# Patient Record
Sex: Female | Born: 1975 | Race: Black or African American | Hispanic: No | Marital: Married | Smoking: Never smoker
Health system: Southern US, Community
[De-identification: ages and names within clinical notes are randomized; demographics above are authoritative.]

## PROBLEM LIST (undated history)

## (undated) DIAGNOSIS — R55 Syncope and collapse: Secondary | ICD-10-CM

## (undated) DIAGNOSIS — I10 Essential (primary) hypertension: Secondary | ICD-10-CM

## (undated) DIAGNOSIS — R569 Unspecified convulsions: Secondary | ICD-10-CM

## (undated) DIAGNOSIS — R5600 Simple febrile convulsions: Secondary | ICD-10-CM

## (undated) DIAGNOSIS — E785 Hyperlipidemia, unspecified: Secondary | ICD-10-CM

## (undated) DIAGNOSIS — N200 Calculus of kidney: Secondary | ICD-10-CM

## (undated) DIAGNOSIS — E119 Type 2 diabetes mellitus without complications: Secondary | ICD-10-CM

## (undated) DIAGNOSIS — N6452 Nipple discharge: Secondary | ICD-10-CM

## (undated) DIAGNOSIS — G40909 Epilepsy, unspecified, not intractable, without status epilepticus: Secondary | ICD-10-CM

## (undated) DIAGNOSIS — G4730 Sleep apnea, unspecified: Secondary | ICD-10-CM

## (undated) DIAGNOSIS — J309 Allergic rhinitis, unspecified: Secondary | ICD-10-CM

## (undated) DIAGNOSIS — N643 Galactorrhea not associated with childbirth: Secondary | ICD-10-CM

## (undated) DIAGNOSIS — R000 Tachycardia, unspecified: Secondary | ICD-10-CM

## (undated) DIAGNOSIS — N63 Unspecified lump in unspecified breast: Secondary | ICD-10-CM

## (undated) DIAGNOSIS — Z8632 Personal history of gestational diabetes: Secondary | ICD-10-CM

## (undated) DIAGNOSIS — E669 Obesity, unspecified: Secondary | ICD-10-CM

## (undated) HISTORY — DX: Galactorrhea not associated with childbirth: N64.3

## (undated) HISTORY — PX: TUBAL LIGATION: SHX77

## (undated) HISTORY — DX: Obesity, unspecified: E66.9

## (undated) HISTORY — DX: Type 2 diabetes mellitus without complications: E11.9

## (undated) HISTORY — DX: Hyperlipidemia, unspecified: E78.5

## (undated) HISTORY — DX: Allergic rhinitis, unspecified: J30.9

## (undated) HISTORY — DX: Syncope and collapse: R55

## (undated) HISTORY — DX: Morbid (severe) obesity due to excess calories: E66.01

## (undated) HISTORY — DX: Calculus of kidney: N20.0

## (undated) HISTORY — DX: Personal history of gestational diabetes: Z86.32

## (undated) HISTORY — DX: Tachycardia, unspecified: R00.0

## (undated) HISTORY — DX: Sleep apnea, unspecified: G47.30

---

## 1999-11-06 HISTORY — PX: RIGHT OOPHORECTOMY: SHX2359

## 2005-11-05 HISTORY — PX: TUBAL LIGATION: SHX77

## 2005-12-18 ENCOUNTER — Ambulatory Visit: Payer: Self-pay | Admitting: Family Medicine

## 2006-01-28 ENCOUNTER — Ambulatory Visit (HOSPITAL_COMMUNITY): Admission: RE | Admit: 2006-01-28 | Discharge: 2006-01-28 | Payer: Self-pay | Admitting: Obstetrics & Gynecology

## 2006-04-08 ENCOUNTER — Ambulatory Visit (HOSPITAL_COMMUNITY): Admission: RE | Admit: 2006-04-08 | Discharge: 2006-04-08 | Payer: Self-pay | Admitting: Obstetrics & Gynecology

## 2006-06-29 ENCOUNTER — Inpatient Hospital Stay (HOSPITAL_COMMUNITY): Admission: AD | Admit: 2006-06-29 | Discharge: 2006-06-29 | Payer: Self-pay | Admitting: Obstetrics and Gynecology

## 2006-08-02 ENCOUNTER — Inpatient Hospital Stay (HOSPITAL_COMMUNITY): Admission: AD | Admit: 2006-08-02 | Discharge: 2006-08-02 | Payer: Self-pay | Admitting: Obstetrics and Gynecology

## 2006-08-03 ENCOUNTER — Inpatient Hospital Stay (HOSPITAL_COMMUNITY): Admission: AD | Admit: 2006-08-03 | Discharge: 2006-08-03 | Payer: Self-pay | Admitting: Obstetrics and Gynecology

## 2006-08-05 ENCOUNTER — Inpatient Hospital Stay (HOSPITAL_COMMUNITY): Admission: AD | Admit: 2006-08-05 | Discharge: 2006-08-05 | Payer: Self-pay | Admitting: Obstetrics and Gynecology

## 2006-08-16 ENCOUNTER — Encounter (INDEPENDENT_AMBULATORY_CARE_PROVIDER_SITE_OTHER): Payer: Self-pay | Admitting: Specialist

## 2006-08-16 ENCOUNTER — Inpatient Hospital Stay (HOSPITAL_COMMUNITY): Admission: RE | Admit: 2006-08-16 | Discharge: 2006-08-19 | Payer: Self-pay | Admitting: Obstetrics and Gynecology

## 2007-01-15 ENCOUNTER — Emergency Department (HOSPITAL_COMMUNITY): Admission: EM | Admit: 2007-01-15 | Discharge: 2007-01-15 | Payer: Self-pay | Admitting: Emergency Medicine

## 2007-07-29 DIAGNOSIS — D72829 Elevated white blood cell count, unspecified: Secondary | ICD-10-CM

## 2007-09-01 ENCOUNTER — Ambulatory Visit: Payer: Self-pay | Admitting: Gastroenterology

## 2008-07-02 DIAGNOSIS — E785 Hyperlipidemia, unspecified: Secondary | ICD-10-CM | POA: Insufficient documentation

## 2008-07-16 ENCOUNTER — Ambulatory Visit: Payer: Self-pay | Admitting: Family Medicine

## 2008-11-26 DIAGNOSIS — G4731 Primary central sleep apnea: Secondary | ICD-10-CM | POA: Insufficient documentation

## 2008-12-15 ENCOUNTER — Emergency Department (HOSPITAL_COMMUNITY): Admission: EM | Admit: 2008-12-15 | Discharge: 2008-12-15 | Payer: Self-pay | Admitting: Emergency Medicine

## 2008-12-28 ENCOUNTER — Ambulatory Visit: Payer: Self-pay | Admitting: Family Medicine

## 2011-02-20 LAB — DIFFERENTIAL
Basophils Relative: 0 % (ref 0–1)
Eosinophils Relative: 0 % (ref 0–5)
Lymphocytes Relative: 7 % — ABNORMAL LOW (ref 12–46)
Lymphs Abs: 1.2 10*3/uL (ref 0.7–4.0)
Monocytes Relative: 8 % (ref 3–12)
Neutro Abs: 14.3 10*3/uL — ABNORMAL HIGH (ref 1.7–7.7)
Neutrophils Relative %: 85 % — ABNORMAL HIGH (ref 43–77)

## 2011-02-20 LAB — CBC
HCT: 38.2 % (ref 36.0–46.0)
Hemoglobin: 12.4 g/dL (ref 12.0–15.0)
MCHC: 32.4 g/dL (ref 30.0–36.0)
MCV: 77.9 fL — ABNORMAL LOW (ref 78.0–100.0)
Platelets: 275 10*3/uL (ref 150–400)
RBC: 4.9 MIL/uL (ref 3.87–5.11)

## 2011-02-20 LAB — POCT I-STAT, CHEM 8
BUN: 4 mg/dL — ABNORMAL LOW (ref 6–23)
Chloride: 97 mEq/L (ref 96–112)
Hemoglobin: 13.6 g/dL (ref 12.0–15.0)

## 2011-03-23 NOTE — Discharge Summary (Signed)
Erin Good, Erin Good         ACCOUNT NO.:  [id number]   MEDICAL RECORD NO.:  [id number]          PATIENT TYPE:  INP   LOCATION:  9125                          FACILITY:  [facility]   PHYSICIAN:  Freda Munro, M.D.    DATE OF BIRTH:  1976/09/21   DATE OF ADMISSION:  08/16/2006  DATE OF DISCHARGE:  08/19/2006                                 DISCHARGE SUMMARY   FINAL DIAGNOSES:  1. Intrauterine pregnancy at term,  2. History of prior cesarean sections.  The patient desires repeat      cesarean section.  3. Multiparous and desires permanent sterilization.  4. Insulin-requiring diabetic.  5. Epilepsy.  6. History of macrosomia.   PROCEDURE:  Repeat low transverse cesarean section with bilateral tubal  ligation.   SURGEON:  Dr. Bobbye Charleston.   ASSISTANT:  Dr. Vanessa Kick.   COMPLICATIONS:  None.   This 35 year old, G2 P1-0-0-1, presents at term for a repeat cesarean  section.  The patient had a prior cesarean section secondary to macrosomia  and desires repeat with this pregnancy and also desires permanent  sterilization.  The patient's antepartum course has been complicated by her  diabetes.  She has been on insulin throughout the pregnancy and has been in  good control.  The patient also has a history of epilepsy, has been on  Lamictal throughout her pregnancy.  Her levels were checked monthly and was  also seeing a neurologist as well.  The patient was seen for antepartum  testing as needed with BPP and NSTs which were reactive and within normal  limits.  She is admitted at this time.  She is taken to the operating room  on August 16, 2006, where a repeat low transverse cesarean section was  performed with the delivery of a 9 pounds 0 ounces female infant with  Apgar's of 7 and 8, delivery went without complications.  The patient still  expressed her desires for permanent sterilization which was performed.  Her postoperative course was benign without any significant  fevers.  The  baby was in the NICU.  I am not sure of the reasoning but the baby did get  transferred to the NICU and was being monitored.  Her blood sugars were  monitored.   She was sent home on metformin to take her 500 mg in the morning, was to  take Percocet one to two every 4 hours as needed for pain, was to follow up  on Thursday in the office for an incision check, to be checking her blood  sugars fasting and 2-hour postprandial, to call with any change in her blood  sugars any increased bleeding, pain, fever or problems.   DISCHARGE LABORATORY:  The patient had a hemoglobin of 9.7, white blood cell  count of 10.2, platelets of 206,000.      Jeannette How, P.A.-C.    ______________________________  Freda Munro, M.D.    MB/MEDQ  D:  09/06/2006  T:  09/06/2006  Job:  [id number]

## 2011-03-23 NOTE — Op Note (Signed)
Erin Good, Erin Good         ACCOUNT NO.:  [id number]   MEDICAL RECORD NO.:  [id number]          PATIENT TYPE:  INP   LOCATION:  9125                          FACILITY:  [facility]   PHYSICIAN:  Bobbye Charleston, M.D. DATE OF BIRTH:  1975/11/15   DATE OF PROCEDURE:  08/16/2006  DATE OF DISCHARGE:                                 OPERATIVE REPORT   PREOPERATIVE DIAGNOSES:  1. Repeat cesarean section at term.  2. Multiparous, desires permanent sterility.   POSTOPERATIVE DIAGNOSES:  1. Repeat cesarean section at term.  2. Multiparous, desires permanent sterility.   PROCEDURE:  Low transverse cesarean section with bilateral tubal ligation  incision.   SURGEON:  Bobbye Charleston, M.D.   ASSISTANT:  Farrel Gobble. Harrington Challenger, M.D.   ANESTHESIA:  Spinal.   SPECIMENS:  Right and left tubal segments and placenta, both the pathology.   ESTIMATED BLOOD LOSS:  750 mL.   INTRAVENOUS FLUIDS:  2000 mL.   URINE OUTPUT:  150 mL.   COMPLICATIONS:  None.   FINDINGS:  A female infant in vertex presentation, Apgar scores 7 and 8,  weight 9 pounds even.  There was a normal right tube and ovary and uterus  seen.  There was no left tube and ovary seen, as this had been removed at  prior surgery.   COUNTS:  Correct x3.   TECHNIQUE:  After adequate spinal epidural anesthesia was achieved, the  patient was placed in the dorsal supine position with a leftward tilt.  A  skin incision was made subumbilical, vertically, and curving around the  lower part of the umbilicus with the scalpel.  The incision was then carried  down to the fascia with the Bovie cautery.  The fascia was incised with the  scalpel in a superior and inferior manner.  Mayo scissors were then used to  incise the fascia in a superior and inferior manner with good visualization  of the bladder.  The peritoneum was entered into sharply and only omentum  was underneath the incision.  The peritoneum was then incised in a superior  and  inferior manner with good visualization of the bowel and the bladder.   The omentum was stuck to both sides of the peritoneum on either side of the  incision and this was carefully taken down with the Bovie cautery and there  was no the bowel anywhere in the field of dissection.  The omentum was freed  and then released back up into the abdomen and a bladder blade was placed.  The vesicouterine fascia was tented up and incised in a transverse  curvilinear manner.  The bladder flap was created with blunt and sharp  dissection.  A 2-cm incision was made in the upper portion of the lower  uterine segment with the scalpel until clear fluid was noted on entry into  the amnion.  The bandage scissors were used to extend the incision in a  transverse curvilinear manner.  The baby was identified in the vertex  presentation and brought through the incision without complication.  The  baby was bulb-suctioned and the cord was clamped and cut.  The baby was  handed to awaiting Pediatrics.   After cord bloods were obtained, the placenta was delivered manually and the  uterus exteriorized, wrapped in wet lap, and cleared of all debris.  The  uterine incision was closed with a running-lock stitch of 0 Monocryl.  An  imbricating layer was then performed of 0 Monocryl.  Three additional  stitches of 9 Monocryl were then used to ensure hemostasis.  Attention was  then turned to the tube and ovary.  The left side was inspected and found to  have only a round ligament on the left-hand side, no ovary and no tube.  The  right tube was then tented up with the Babcock and the mesosalpinx entered  into with the Bovie cautery.  Two plain gut ties were placed on each side  and tied down to create an intervening segment of approximately 2 cm.  The  intervening segment was then excised with a Metzenbaum and sent to  Pathology.   Hemostasis was achieved and the uterine incision was reinspected and found  to be  hemostatic.  The uterus was replaced in the abdomen and the abdomen  cleared of debris with irrigation.  The uterine incision was reinspected and  found to be hemostatic inside the abdomen.  All instruments were then  withdrawn from the abdomen.  The peritoneum was closed with a running stitch  of 2-0 Vicryl; this incorporated 1 stitch of the rectus muscles.  The 0 PDS  was then used to close the fascia and the rectus muscles together in the  midline.  The subcutaneous tissue was then rendered hemostatic with Bovie  cautery and irrigation.  This layer was then closed with interrupted  stitches of 2-0 plain gut.  It was decided that since there was not a lot of  fluid in this area that we would not to put a drain in.  The subcuticular  tissue was then closed with interrupted stitches of 2-0 plain gut to  reapproximate the skin edges better.  The skin was closed with staples.  The  patient tolerated the procedure well and was returned to the recovery room  in stable condition.      Bobbye Charleston, M.D.  Electronically Signed     MH/MEDQ  D:  08/16/2006  T:  08/19/2006  Job:  [id number]

## 2011-05-11 ENCOUNTER — Other Ambulatory Visit: Payer: Self-pay | Admitting: Obstetrics and Gynecology

## 2012-03-04 ENCOUNTER — Encounter (HOSPITAL_COMMUNITY): Payer: Self-pay | Admitting: *Deleted

## 2012-03-04 ENCOUNTER — Emergency Department (HOSPITAL_COMMUNITY): Payer: Managed Care, Other (non HMO)

## 2012-03-04 ENCOUNTER — Emergency Department (HOSPITAL_COMMUNITY)
Admission: EM | Admit: 2012-03-04 | Discharge: 2012-03-04 | Disposition: A | Discharge status: Home / Self Care | Payer: Managed Care, Other (non HMO) | Attending: Emergency Medicine | Admitting: Emergency Medicine

## 2012-03-04 DIAGNOSIS — R109 Unspecified abdominal pain: Secondary | ICD-10-CM | POA: Insufficient documentation

## 2012-03-04 DIAGNOSIS — R0602 Shortness of breath: Secondary | ICD-10-CM | POA: Insufficient documentation

## 2012-03-04 DIAGNOSIS — R10819 Abdominal tenderness, unspecified site: Secondary | ICD-10-CM | POA: Insufficient documentation

## 2012-03-04 DIAGNOSIS — E669 Obesity, unspecified: Secondary | ICD-10-CM | POA: Insufficient documentation

## 2012-03-04 DIAGNOSIS — G40909 Epilepsy, unspecified, not intractable, without status epilepticus: Secondary | ICD-10-CM | POA: Insufficient documentation

## 2012-03-04 HISTORY — DX: Unspecified convulsions: R56.9

## 2012-03-04 HISTORY — DX: Epilepsy, unspecified, not intractable, without status epilepticus: G40.909

## 2012-03-04 LAB — URINALYSIS, ROUTINE W REFLEX MICROSCOPIC
Bilirubin Urine: NEGATIVE
Glucose, UA: NEGATIVE mg/dL
Hgb urine dipstick: NEGATIVE
Ketones, ur: NEGATIVE mg/dL
Leukocytes, UA: NEGATIVE
Nitrite: NEGATIVE
Protein, ur: NEGATIVE mg/dL
Specific Gravity, Urine: 1.023 (ref 1.005–1.030)
Urobilinogen, UA: 0.2 mg/dL (ref 0.0–1.0)
pH: 6.5 (ref 5.0–8.0)

## 2012-03-04 LAB — PREGNANCY, URINE: Preg Test, Ur: NEGATIVE

## 2012-03-04 LAB — WET PREP, GENITAL
Clue Cells Wet Prep HPF POC: NONE SEEN
Trich, Wet Prep: NONE SEEN
Yeast Wet Prep HPF POC: NONE SEEN

## 2012-03-04 MED ORDER — HYDROCODONE-ACETAMINOPHEN 10-325 MG PO TABS
1.0000 | ORAL_TABLET | Freq: Once | ORAL | Status: AC
  Administered 2012-03-04: 1 via ORAL
  Filled 2012-03-04: qty 1

## 2012-03-04 MED ORDER — HYDROCODONE-ACETAMINOPHEN 5-325 MG PO TABS: 1.0000 | ORAL_TABLET | ORAL | Status: AC | PRN

## 2012-03-04 NOTE — ED Provider Notes (Signed)
History     CSN: [id number]  Arrival date & time 03/04/12  7425   First MD Initiated Contact with Patient 03/04/12 0848      Chief Complaint  Patient presents with  . Abdominal Pain  . Shortness of Breath    (Consider location/radiation/quality/duration/timing/severity/associated sxs/prior treatment) HPI Comments: Patient presents complaining of suprapubic pain that began this morning at approximately 7:30.  She describes it as crampy and feeling like when she had a miscarriage.  She does note that she's had a tubal ligation so she does not believe she is pregnant.  She denies any current vaginal bleeding or vaginal discharge.  Her last menstrual period was 2 weeks ago and normal for her.  She denies any dysuria or hematuria or history of kidney stones.  Denies any nausea, vomiting or changes in bowel habits.  If she bears down the pain does get worse.  She notes when the pain first came on and was very sharp and stabbing that she felt a little short of breath but as she was able to drink some water and calm herself down her breathing is back to normal now.  She also notes that her abdominal pain is slowly improved without further intervention.  She did not take any pain medications at home.  She describes the pain as persistent which is why she comes in for evaluation.  Patient is a 36 y.o. female presenting with abdominal pain and shortness of breath. The history is provided by the patient. No language interpreter was used.  Abdominal Pain The primary symptoms of the illness include abdominal pain and shortness of breath. The primary symptoms of the illness do not include fever, fatigue, nausea, vomiting, diarrhea, hematemesis, hematochezia, dysuria, vaginal discharge or vaginal bleeding. The current episode started 1 to 2 hours ago. The onset of the illness was gradual. The problem has been gradually improving.  The patient states that she believes she is currently not pregnant. The patient  has not had a change in bowel habit. Symptoms associated with the illness do not include chills, anorexia, diaphoresis, heartburn, constipation, urgency, hematuria, frequency or back pain.  Shortness of Breath  Associated symptoms include shortness of breath. Pertinent negatives include no chest pain, no fever and no cough.    Past Medical History  Diagnosis Date  . Epilepsy   . Seizures     Past Surgical History  Procedure Date  . Cesarean section   . Tubal ligation   . Right oophorectomy     No family history on file.  History  Substance Use Topics  . Smoking status: Never Smoker   . Smokeless tobacco: Not on file  . Alcohol Use: No    OB History    Grav Para Term Preterm Abortions TAB SAB Ect Mult Living                  Review of Systems  Constitutional: Negative.  Negative for fever, chills, diaphoresis and fatigue.  HENT: Negative.   Eyes: Negative.  Negative for discharge and redness.  Respiratory: Positive for shortness of breath. Negative for cough.   Cardiovascular: Negative.  Negative for chest pain.  Gastrointestinal: Positive for abdominal pain. Negative for heartburn, nausea, vomiting, diarrhea, constipation, hematochezia, anorexia and hematemesis.  Genitourinary: Negative.  Negative for dysuria, urgency, frequency, hematuria, vaginal bleeding and vaginal discharge.  Musculoskeletal: Negative.  Negative for back pain.  Skin: Negative.  Negative for color change and rash.  Neurological: Negative.  Negative for syncope and  headaches.  Hematological: Negative.  Negative for adenopathy.  Psychiatric/Behavioral: Negative.  Negative for confusion.  All other systems reviewed and are negative.    Allergies  Review of patient's allergies indicates no known allergies.  Home Medications  No current outpatient prescriptions on file.  BP 127/72  Temp(Src) 98.1 F (36.7 C) (Oral)  Resp 14  SpO2 100%  LMP 02/19/2012  Physical Exam  Nursing note and  vitals reviewed. Constitutional: She is oriented to person, place, and time. She appears well-developed and well-nourished.  Non-toxic appearance. She does not have a sickly appearance.       Obese female lying on the bed  HENT:  Head: Normocephalic and atraumatic.  Eyes: Conjunctivae, EOM and lids are normal. Pupils are equal, round, and reactive to light. No scleral icterus.  Neck: Trachea normal and normal range of motion. Neck supple.  Cardiovascular: Normal rate, regular rhythm and normal heart sounds.  Exam reveals no gallop and no friction rub.   No murmur heard. Pulmonary/Chest: Effort normal and breath sounds normal. No respiratory distress. She has no wheezes. She has no rales.  Abdominal: Soft. Normal appearance. There is tenderness. There is no rebound, no guarding and no CVA tenderness.       Mild suprapubic tenderness on examination  Genitourinary:       Normal external genitalia.  No rashes/lesions.  Closed cervical os.  No bleeding or discharge.  Mild CMT on exam.  No cervical inflammation.  Unable to palpate ovaries due to body habitus.  [place] present during exam.    Musculoskeletal: Normal range of motion. She exhibits no edema.  Neurological: She is alert and oriented to person, place, and time. She has normal strength.  Skin: Skin is warm, dry and intact. No rash noted.  Psychiatric: She has a normal mood and affect. Her behavior is normal. Judgment and thought content normal.    ED Course  Procedures (including critical care time)  Results for orders placed during the hospital encounter of 03/04/12  URINALYSIS, ROUTINE W REFLEX MICROSCOPIC      Component Value Range   Color, Urine YELLOW  YELLOW    APPearance CLEAR  CLEAR    Specific Gravity, Urine 1.023  1.005 - 1.030    pH 6.5  5.0 - 8.0    Glucose, UA NEGATIVE  NEGATIVE (mg/dL)   Hgb urine dipstick NEGATIVE  NEGATIVE    Bilirubin Urine NEGATIVE  NEGATIVE    Ketones, ur NEGATIVE  NEGATIVE (mg/dL)    Protein, ur NEGATIVE  NEGATIVE (mg/dL)   Urobilinogen, UA 0.2  0.0 - 1.0 (mg/dL)   Nitrite NEGATIVE  NEGATIVE    Leukocytes, UA NEGATIVE  NEGATIVE   PREGNANCY, URINE      Component Value Range   Preg Test, Ur NEGATIVE  NEGATIVE   WET PREP, GENITAL      Component Value Range   Yeast Wet Prep HPF POC NONE SEEN  NONE SEEN    Trich, Wet Prep NONE SEEN  NONE SEEN    Clue Cells Wet Prep HPF POC NONE SEEN  NONE SEEN    WBC, Wet Prep HPF POC FEW (*) NONE SEEN    US Transvaginal Non-ob  03/04/2012  *RADIOLOGY REPORT*  Clinical Data:  Pain  TRANSABDOMINAL AND TRANSVAGINAL ULTRASOUND OF PELVIS DOPPLER ULTRASOUND OF OVARIES  Technique:  Both transabdominal and transvaginal ultrasound examinations of the pelvis were performed. Transabdominal technique was performed for global imaging of the pelvis including uterus, ovaries, adnexal regions, and pelvic cul-de-sac.  It was necessary to proceed with endovaginal exam following the transabdominal exam to visualize the endometrium and adnexa.  Color and duplex Doppler ultrasound was utilized to evaluate blood flow to the ovaries.  Comparison:  None.  Findings: The uterus is normal in size and echotexture, measuring 10.3 x 4.9 x 5.7 cm.  There is a solitary 1.2 cm posterior myometrial fibroid at the mid segment.  Endometrial stripe is thin and homogeneous, measuring 6 mm in width.  The left ovary is surgically absent.  The right ovary has a normal size and appearance, measuring 4.1 x 3.3 x 2.4 cm.  There is a 1.5 cm simple follicle on the right ovary.  A small amount of free pelvic fluid is present.  Pulsed Doppler evaluation demonstrates normal low-resistance arterial and venous waveforms inthe right ovary.  IMPRESSION: Normal exam.  No evidence of pelvic mass or other significant abnormality.  No sonographic evidence for ovarian torsion.  Original Report Authenticated By: Duayne Cal, M.D.   US Pelvis Complete  03/04/2012  *RADIOLOGY REPORT*  Clinical Data:  Pain   TRANSABDOMINAL AND TRANSVAGINAL ULTRASOUND OF PELVIS DOPPLER ULTRASOUND OF OVARIES  Technique:  Both transabdominal and transvaginal ultrasound examinations of the pelvis were performed. Transabdominal technique was performed for global imaging of the pelvis including uterus, ovaries, adnexal regions, and pelvic cul-de-sac.  It was necessary to proceed with endovaginal exam following the transabdominal exam to visualize the endometrium and adnexa.  Color and duplex Doppler ultrasound was utilized to evaluate blood flow to the ovaries.  Comparison:  None.  Findings: The uterus is normal in size and echotexture, measuring 10.3 x 4.9 x 5.7 cm.  There is a solitary 1.2 cm posterior myometrial fibroid at the mid segment.  Endometrial stripe is thin and homogeneous, measuring 6 mm in width.  The left ovary is surgically absent.  The right ovary has a normal size and appearance, measuring 4.1 x 3.3 x 2.4 cm.  There is a 1.5 cm simple follicle on the right ovary.  A small amount of free pelvic fluid is present.  Pulsed Doppler evaluation demonstrates normal low-resistance arterial and venous waveforms inthe right ovary.  IMPRESSION: Normal exam.  No evidence of pelvic mass or other significant abnormality.  No sonographic evidence for ovarian torsion.  Original Report Authenticated By: Duayne Cal, M.D.   [place] Art/ven Flow Abd Pelv Doppler  03/04/2012  *RADIOLOGY REPORT*  Clinical Data:  Pain  TRANSABDOMINAL AND TRANSVAGINAL ULTRASOUND OF PELVIS DOPPLER ULTRASOUND OF OVARIES  Technique:  Both transabdominal and transvaginal ultrasound examinations of the pelvis were performed. Transabdominal technique was performed for global imaging of the pelvis including uterus, ovaries, adnexal regions, and pelvic cul-de-sac.  It was necessary to proceed with endovaginal exam following the transabdominal exam to visualize the endometrium and adnexa.  Color and duplex Doppler ultrasound was utilized to evaluate blood flow to the  ovaries.  Comparison:  None.  Findings: The uterus is normal in size and echotexture, measuring 10.3 x 4.9 x 5.7 cm.  There is a solitary 1.2 cm posterior myometrial fibroid at the mid segment.  Endometrial stripe is thin and homogeneous, measuring 6 mm in width.  The left ovary is surgically absent.  The right ovary has a normal size and appearance, measuring 4.1 x 3.3 x 2.4 cm.  There is a 1.5 cm simple follicle on the right ovary.  A small amount of free pelvic fluid is present.  Pulsed Doppler evaluation demonstrates normal low-resistance arterial and venous waveforms  inthe right ovary.  IMPRESSION: Normal exam.  No evidence of pelvic mass or other significant abnormality.  No sonographic evidence for ovarian torsion.  Original Report Authenticated By: Duayne Cal, M.D.      MDM  Patient with no signs of urinary tract infection or hematuria to suggest kidney stone.  Patient's pregnancy test is negative.  Patient had a pelvic exam that did not suggest sexually transmitted infection or other abnormalities.  Her wet prep correlates with this at this time.  I did obtain an ultrasound to look for signs of ovarian torsion or cyst.  Patient had originally thought that she had a left ovary but that was mistaken.  She has no signs of torsion or cysts or fibroids at this time.  Patient's pain is improved here after the Norco that was administered in the emergency department.  At this point in time I feel the patient is safe for discharge home as this may be some mild constipation or GI cramping but she shows no signs of acute infection to indicate diverticulitis to necessitate further laboratory or imaging workup at this time.  I have advised the patient that she should return for worsening abdominal pain, fevers or other symptoms and she understands this at time of discharge.        Lezlie Octave, MD 03/04/12 1140

## 2012-03-04 NOTE — Discharge Instructions (Signed)

## 2012-03-04 NOTE — ED Notes (Signed)
Hosmer, EDP at bedside.

## 2012-03-04 NOTE — ED Notes (Signed)
Pt reports sob and lower abd pain since 0730 today. "feels like a miscarriage." Describes pain as cramping, has had tubes tied, LMP 2 weeks ago. Denies vaginal bleeding/discharge, urinary symptoms. C/o pain when straining to have BM. Associated sob at rest, 99% RA.

## 2012-03-05 LAB — GC/CHLAMYDIA PROBE AMP, GENITAL
Chlamydia, DNA Probe: NEGATIVE
GC Probe Amp, Genital: NEGATIVE

## 2012-03-06 ENCOUNTER — Ambulatory Visit: Payer: Self-pay | Admitting: Family Medicine

## 2012-03-07 ENCOUNTER — Ambulatory Visit: Payer: Self-pay | Admitting: Family Medicine

## 2012-03-13 ENCOUNTER — Ambulatory Visit: Payer: Self-pay | Admitting: Urology

## 2012-03-29 ENCOUNTER — Emergency Department (HOSPITAL_COMMUNITY): Payer: Managed Care, Other (non HMO)

## 2012-03-29 ENCOUNTER — Encounter (HOSPITAL_COMMUNITY): Payer: Self-pay | Admitting: Emergency Medicine

## 2012-03-29 ENCOUNTER — Emergency Department (HOSPITAL_COMMUNITY)
Admission: EM | Admit: 2012-03-29 | Discharge: 2012-03-29 | Disposition: A | Discharge status: Home / Self Care | Payer: Managed Care, Other (non HMO) | Attending: Emergency Medicine | Admitting: Emergency Medicine

## 2012-03-29 DIAGNOSIS — N83209 Unspecified ovarian cyst, unspecified side: Secondary | ICD-10-CM | POA: Insufficient documentation

## 2012-03-29 DIAGNOSIS — Z79899 Other long term (current) drug therapy: Secondary | ICD-10-CM | POA: Insufficient documentation

## 2012-03-29 DIAGNOSIS — R109 Unspecified abdominal pain: Secondary | ICD-10-CM | POA: Insufficient documentation

## 2012-03-29 DIAGNOSIS — R000 Tachycardia, unspecified: Secondary | ICD-10-CM | POA: Insufficient documentation

## 2012-03-29 DIAGNOSIS — G40909 Epilepsy, unspecified, not intractable, without status epilepticus: Secondary | ICD-10-CM | POA: Insufficient documentation

## 2012-03-29 DIAGNOSIS — B9789 Other viral agents as the cause of diseases classified elsewhere: Secondary | ICD-10-CM | POA: Insufficient documentation

## 2012-03-29 DIAGNOSIS — B349 Viral infection, unspecified: Secondary | ICD-10-CM

## 2012-03-29 DIAGNOSIS — R509 Fever, unspecified: Secondary | ICD-10-CM | POA: Insufficient documentation

## 2012-03-29 LAB — CBC
HCT: 35.9 % — ABNORMAL LOW (ref 36.0–46.0)
Hemoglobin: 11.5 g/dL — ABNORMAL LOW (ref 12.0–15.0)
MCH: 25.1 pg — ABNORMAL LOW (ref 26.0–34.0)
MCV: 78.4 fL (ref 78.0–100.0)
RBC: 4.58 MIL/uL (ref 3.87–5.11)

## 2012-03-29 LAB — DIFFERENTIAL
Eosinophils Absolute: 0 10*3/uL (ref 0.0–0.7)
Eosinophils Relative: 0 % (ref 0–5)
Lymphs Abs: 0.9 10*3/uL (ref 0.7–4.0)
Monocytes Absolute: 0.1 10*3/uL (ref 0.1–1.0)
Monocytes Relative: 1 % — ABNORMAL LOW (ref 3–12)

## 2012-03-29 LAB — BASIC METABOLIC PANEL
BUN: 7 mg/dL (ref 6–23)
Calcium: 9.5 mg/dL (ref 8.4–10.5)
Creatinine, Ser: 0.69 mg/dL (ref 0.50–1.10)
GFR calc non Af Amer: >90 mL/min (ref >90)
Glucose, Bld: 173 mg/dL — ABNORMAL HIGH (ref 70–99)
Potassium: 3.5 mEq/L (ref 3.5–5.1)

## 2012-03-29 LAB — URINALYSIS, ROUTINE W REFLEX MICROSCOPIC
Specific Gravity, Urine: 1.016 (ref 1.005–1.030)
Urobilinogen, UA: 0.2 mg/dL (ref 0.0–1.0)

## 2012-03-29 MED ORDER — SODIUM CHLORIDE 0.9 % IV BOLUS (SEPSIS)
1000.0000 mL | Freq: Once | INTRAVENOUS | Status: AC
  Administered 2012-03-29: 1000 mL via INTRAVENOUS

## 2012-03-29 MED ORDER — ACETAMINOPHEN 325 MG PO TABS
650.0000 mg | ORAL_TABLET | Freq: Once | ORAL | Status: AC
  Administered 2012-03-29: 650 mg via ORAL
  Filled 2012-03-29: qty 2

## 2012-03-29 MED ORDER — MORPHINE SULFATE 4 MG/ML IJ SOLN
4.0000 mg | Freq: Once | INTRAMUSCULAR | Status: AC
  Administered 2012-03-29: 4 mg via INTRAVENOUS
  Filled 2012-03-29: qty 1

## 2012-03-29 NOTE — ED Notes (Signed)
Pt presented to the ER with c/o left and right flank radiated to back and lower abd, pt states that she has Hx of kidney stones, pt also reports chills and generalized aching. Pain level rated 8/10 at this time.

## 2012-03-29 NOTE — ED Provider Notes (Signed)
Medical screening examination/treatment/procedure(s) were performed by non-physician practitioner and as supervising physician I was immediately available for consultation/collaboration.  Nat Christen, MD 03/29/12 1112

## 2012-03-29 NOTE — ED Notes (Signed)
Pt states she was prescribed oxycodone for pain, but it made her feel bad (anxiety, and heart fluttering) and she stopped taking it.

## 2012-03-29 NOTE — ED Notes (Signed)
Pt denies n/v at this time.

## 2012-03-29 NOTE — ED Provider Notes (Signed)
Medical screening examination/treatment/procedure(s) were performed by non-physician practitioner and as supervising physician I was immediately available for consultation/collaboration.   Wynetta Fines, MD 03/29/12 [phone]

## 2012-03-29 NOTE — Discharge Instructions (Signed)
You were seen and evaluated for your complaints of chills and lower back pains. At this time your providers feel your symptoms may be caused from a viral infection. Continue to take Tylenol ibuprofen for your fever and chills symptoms. Drink plenty of fluids to stay hydrated. Please followup with your primary care provider next week.   Viral Infections A viral infection can be caused by different types of viruses.Most viral infections are not serious and resolve on their own. However, some infections may cause severe symptoms and may lead to further complications. SYMPTOMS Viruses can frequently cause:  Minor sore throat.   Aches and pains.   Headaches.   Runny nose.   Different types of rashes.   Watery eyes.   Tiredness.   Cough.   Loss of appetite.   Gastrointestinal infections, resulting in nausea, vomiting, and diarrhea.  These symptoms do not respond to antibiotics because the infection is not caused by bacteria. However, you might catch a bacterial infection following the viral infection. This is sometimes called a "superinfection." Symptoms of such a bacterial infection may include:  Worsening sore throat with pus and difficulty swallowing.   Swollen neck glands.   Chills and a high or persistent fever.   Severe headache.   Tenderness over the sinuses.   Persistent overall ill feeling (malaise), muscle aches, and tiredness (fatigue).   Persistent cough.   Yellow, green, or brown mucus production with coughing.  HOME CARE INSTRUCTIONS   Only take over-the-counter or prescription medicines for pain, discomfort, diarrhea, or fever as directed by your caregiver.   Drink enough water and fluids to keep your urine clear or pale yellow. Sports drinks can provide valuable electrolytes, sugars, and hydration.   Get plenty of rest and maintain proper nutrition. Soups and broths with crackers or rice are fine.  SEEK IMMEDIATE MEDICAL CARE IF:   You have severe  headaches, shortness of breath, chest pain, neck pain, or an unusual rash.   You have uncontrolled vomiting, diarrhea, or you are unable to keep down fluids.   You or your child has an oral temperature above 102 F (38.9 C), not controlled by medicine.   Your baby is older than 3 months with a rectal temperature of 102 F (38.9 C) or higher.   Your baby is 10 months old or younger with a rectal temperature of 100.4 F (38 C) or higher.  MAKE SURE YOU:   Understand these instructions.   Will watch your condition.   Will get help right away if you are not doing well or get worse.  Document Released: 08/01/2005 Document Revised: 10/11/2011 Document Reviewed: 02/26/2011 [facility] Patient Information 2012 [place].  Ovarian Cyst The ovaries are small organs that are on each side of the uterus. The ovaries are the organs that produce the female hormones, estrogen and progesterone. An ovarian cyst is a sac filled with fluid that can vary in its size. It is normal for a small cyst to form in women who are in the childbearing age and who have menstrual periods. This type of cyst is called a follicle cyst that becomes an ovulation cyst (corpus luteum cyst) after it produces the women's egg. It later goes away on its own if the woman does not become pregnant. There are other kinds of ovarian cysts that may cause problems and may need to be treated. The most serious problem is a cyst with cancer. It should be noted that menopausal women who have an ovarian cyst are  at a higher risk of it being a cancer cyst. They should be evaluated very quickly, thoroughly and followed closely. This is especially true in menopausal women because of the high rate of ovarian cancer in women in menopause. CAUSES AND TYPES OF OVARIAN CYSTS:  FUNCTIONAL CYST: The follicle/corpus luteum cyst is a functional cyst that occurs every month during ovulation with the menstrual cycle. They go away with the next menstrual  cycle if the woman does not get pregnant. Usually, there are no symptoms with a functional cyst.   ENDOMETRIOMA CYST: This cyst develops from the lining of the uterus tissue. This cyst gets in or on the ovary. It grows every month from the bleeding during the menstrual period. It is also called a "chocolate cyst" because it becomes filled with blood that turns brown. This cyst can cause pain in the lower abdomen during intercourse and with your menstrual period.   CYSTADENOMA CYST: This cyst develops from the cells on the outside of the ovary. They usually are not cancerous. They can get very big and cause lower abdomen pain and pain with intercourse. This type of cyst can twist on itself, cut off its blood supply and cause severe pain. It also can easily rupture and cause a lot of pain.   DERMOID CYST: This type of cyst is sometimes found in both ovaries. They are found to have different kinds of body tissue in the cyst. The tissue includes skin, teeth, hair, and/or cartilage. They usually do not have symptoms unless they get very big. Dermoid cysts are rarely cancerous.   POLYCYSTIC OVARY: This is a rare condition with hormone problems that produces many small cysts on both ovaries. The cysts are follicle-like cysts that never produce an egg and become a corpus luteum. It can cause an increase in body weight, infertility, acne, increase in body and facial hair and lack of menstrual periods or rare menstrual periods. Many women with this problem develop type 2 diabetes. The exact cause of this problem is unknown. A polycystic ovary is rarely cancerous.   THECA LUTEIN CYST: Occurs when too much hormone (human chorionic gonadotropin) is produced and over-stimulates the ovaries to produce an egg. They are frequently seen when doctors stimulate the ovaries for invitro-fertilization (test tube babies).   LUTEOMA CYST: This cyst is seen during pregnancy. Rarely it can cause an obstruction to the birth canal  during labor and delivery. They usually go away after delivery.  SYMPTOMS   Pelvic pain or pressure.   Pain during sexual intercourse.   Increasing girth (swelling) of the abdomen.   Abnormal menstrual periods.   Increasing pain with menstrual periods.   You stop having menstrual periods and you are not pregnant.  DIAGNOSIS  The diagnosis can be made during:  Routine or annual pelvic examination (common).   Ultrasound.   X-ray of the pelvis.   CT Scan.   MRI.   Blood tests.  TREATMENT   Treatment may only be to follow the cyst monthly for 2 to 3 months with your caregiver. Many go away on their own, especially functional cysts.   May be aspirated (drained) with a long needle with ultrasound, or by laparoscopy (inserting a tube into the pelvis through a small incision).   The whole cyst can be removed by laparoscopy.   Sometimes the cyst may need to be removed through an incision in the lower abdomen.   Hormone treatment is sometimes used to help dissolve certain cysts.   Birth  control pills are sometimes used to help dissolve certain cysts.  HOME CARE INSTRUCTIONS  Follow your caregiver's advice regarding:  Medicine.   Follow up visits to evaluate and treat the cyst.   You may need to come back or make an appointment with another caregiver, to find the exact cause of your cyst, if your caregiver is not a gynecologist.   Get your yearly and recommended pelvic examinations and Pap tests.   Let your caregiver know if you have had an ovarian cyst in the past.  SEEK MEDICAL CARE IF:   Your periods are late, irregular, they stop, or are painful.   Your stomach (abdomen) or pelvic pain does not go away.   Your stomach becomes larger or swollen.   You have pressure on your bladder or trouble emptying your bladder completely.   You have painful sexual intercourse.   You have feelings of fullness, pressure, or discomfort in your stomach.   You lose weight for  no apparent reason.   You feel generally ill.   You become constipated.   You lose your appetite.   You develop acne.   You have an increase in body and facial hair.   You are gaining weight, without changing your exercise and eating habits.   You think you are pregnant.  SEEK IMMEDIATE MEDICAL CARE IF:   You have increasing abdominal pain.   You feel sick to your stomach (nausea) and/or vomit.   You develop a fever that comes on suddenly.   You develop abdominal pain during a bowel movement.   Your menstrual periods become heavier than usual.  Document Released: 10/22/2005 Document Revised: 10/11/2011 Document Reviewed: 08/25/2009 [facility] Patient Information 2012 [place], [place].     RESOURCE GUIDE  Dental Problems  Patients with Medicaid: [place] Phone:  [phone]                                                  Phone:  [phone]  If unable to pay or uninsured, contact:  [facility] or [facility]. to become qualified for the adult dental clinic.  Chronic Pain Problems Contact [facility]  [phone] Patients need to be referred by their primary care doctor.  Insufficient Money for Medicine Contact [employer]:  call "211" or [place] [phone].  No Primary Care Doctor Call [place] Other agencies that provide inexpensive medical care    [place]  [phone]    [facility]  [place]  [phone]    [facility]  [phone]    [facility]  [place]   [phone] (emergency services [phone])  Substance Abuse Resources [facility]   [phone] [facility] [phone] [facility] [phone] Residential & Outpatient Substance Abuse Program  [phone]  Abuse/Neglect [place] [phone] [place] [phone] (After Hours)  [facility] [place] [phone]  [place] [facility] [place] [phone] [place] [phone]  MRSA Hotline #:   442-650-7071    [facility] Dept. [address]      [place] Phone:  [phone]                                   Phone:  [phone]                 Phone:  [place] Phone:  [place] [phone] (After Hours)

## 2012-03-29 NOTE — ED Provider Notes (Signed)
History     CSN: [id number]  Arrival date & time 03/29/12  1749   First MD Initiated Contact with Patient 03/29/12 [phone]      Chief Complaint  Patient presents with  . Chills  . Flank Pain    HPI  History provided by the patient. Patient is a 36 year old female with history of epilepsy who presents with complaints of persistent flank pains. Pain has been present for the past several weeks. Tonight patient states that she came to she is also having chills and feels cold. She denies any aggravating or alleviating factors. Patient reports being seen and evaluated for similar complaints the past several weeks. She was seen byPCP who obtained a CT scan showing signs for kidney stones. Patient was also seen by urologist. Patient was given Flomax to use her symptoms but states that this pain or feel bad she stopped taking. Patient was also given oxycodone for pain and states this also had adverse reactions of anxiety and heart palpitations. Patient denies any other changes in symptoms. She denies any fever, sweats, nausea, vomiting, diarrhea or constipation. She denies any dysuria, hematuria, urinary frequency, vaginal bleeding or vaginal discharge.    Past Medical History  Diagnosis Date  . Epilepsy   . Seizures     Past Surgical History  Procedure Date  . Cesarean section   . Tubal ligation   . Right oophorectomy     History reviewed. No pertinent family history.  History  Substance Use Topics  . Smoking status: Never Smoker   . Smokeless tobacco: Not on file  . Alcohol Use: No    OB History    Grav Para Term Preterm Abortions TAB SAB Ect Mult Living                  Review of Systems  Constitutional: Positive for chills. Negative for fever, appetite change and fatigue.  Respiratory: Negative for cough and shortness of breath.   Cardiovascular: Negative for chest pain.  Gastrointestinal: Negative for nausea, vomiting, abdominal pain, diarrhea and constipation.    Genitourinary: Positive for flank pain. Negative for dysuria, frequency, hematuria, vaginal bleeding, vaginal discharge and vaginal pain.  Musculoskeletal: Positive for back pain.  Skin: Negative for rash.    Allergies  Aspirin  Home Medications   Current Outpatient Rx  Name Route Sig Dispense Refill  . ZONISAMIDE 100 MG PO CAPS Oral Take 400 mg by mouth at bedtime.      BP 121/72  Pulse 129  Temp(Src) 99.6 F (37.6 C) (Oral)  Resp 18  Ht 5' 7"  (1.702 m)  Wt 325 lb (147.419 kg)  BMI 50.90 kg/m2  SpO2 96%  LMP 03/16/2012  Physical Exam  Nursing note and vitals reviewed. Constitutional: She is oriented to person, place, and time. She appears well-developed and well-nourished. No distress.  HENT:  Head: Normocephalic.  Cardiovascular: Regular rhythm.  Tachycardia present.   Pulmonary/Chest: Effort normal and breath sounds normal. No respiratory distress. She has no wheezes. She has no rales.  Abdominal: Soft. There is no tenderness. There is CVA tenderness. There is no rebound, no guarding, no tenderness at McBurney's point and negative Murphy's sign.       Morbidly obese  Neurological: She is alert and oriented to person, place, and time.  Skin: Skin is warm and dry. No rash noted.  Psychiatric: She has a normal mood and affect. Her behavior is normal.    ED Course  Procedures  Results for orders placed during  the hospital encounter of 03/29/12  URINALYSIS, ROUTINE W REFLEX MICROSCOPIC      Component Value Range   Color, Urine YELLOW  YELLOW    APPearance CLEAR  CLEAR    Specific Gravity, Urine 1.016  1.005 - 1.030    pH 5.5  5.0 - 8.0    Glucose, UA NEGATIVE  NEGATIVE (mg/dL)   Hgb urine dipstick NEGATIVE  NEGATIVE    Bilirubin Urine NEGATIVE  NEGATIVE    Ketones, ur NEGATIVE  NEGATIVE (mg/dL)   Protein, ur NEGATIVE  NEGATIVE (mg/dL)   Urobilinogen, UA 0.2  0.0 - 1.0 (mg/dL)   Nitrite NEGATIVE  NEGATIVE    Leukocytes, UA NEGATIVE  NEGATIVE   PREGNANCY, URINE       Component Value Range   Preg Test, Ur NEGATIVE  NEGATIVE   CBC      Component Value Range   WBC 12.9 (*) 4.0 - 10.5 (K/uL)   RBC 4.58  3.87 - 5.11 (MIL/uL)   Hemoglobin 11.5 (*) 12.0 - 15.0 (g/dL)   HCT 35.9 (*) 36.0 - 46.0 (%)   MCV 78.4  78.0 - 100.0 (fL)   MCH 25.1 (*) 26.0 - 34.0 (pg)   MCHC 32.0  30.0 - 36.0 (g/dL)   RDW 13.6  11.5 - 15.5 (%)   Platelets 354  150 - 400 (K/uL)  DIFFERENTIAL      Component Value Range   Neutrophils Relative 92 (*) 43 - 77 (%)   Neutro Abs 11.9 (*) 1.7 - 7.7 (K/uL)   Lymphocytes Relative 7 (*) 12 - 46 (%)   Lymphs Abs 0.9  0.7 - 4.0 (K/uL)   Monocytes Relative 1 (*) 3 - 12 (%)   Monocytes Absolute 0.1  0.1 - 1.0 (K/uL)   Eosinophils Relative 0  0 - 5 (%)   Eosinophils Absolute 0.0  0.0 - 0.7 (K/uL)   Basophils Relative 0  0 - 1 (%)   Basophils Absolute 0.0  0.0 - 0.1 (K/uL)  BASIC METABOLIC PANEL      Component Value Range   Sodium 136  135 - 145 (mEq/L)   Potassium 3.5  3.5 - 5.1 (mEq/L)   Chloride 99  96 - 112 (mEq/L)   CO2 22  19 - 32 (mEq/L)   Glucose, Bld 173 (*) 70 - 99 (mg/dL)   BUN 7  6 - 23 (mg/dL)   Creatinine, Ser 0.69  0.50 - 1.10 (mg/dL)   Calcium 9.5  8.4 - 10.5 (mg/dL)   GFR calc non Af Amer >90  >90 (mL/min)   GFR calc Af Amer >90  >90 (mL/min)        1. Viral infection       MDM  Patient seen and evaluated. Patient no acute distress.   Patient was seen on 03/04/2012 with complaints of abdominal pain. Patient had pelvic ultrasound performed at that time showing small 1.5 cm right ovarian cyst. This is consistent with similar CT findings today. CT today also does not show any obstructing kidney stones. At this time will refer patient to OB/GYN and urology for followup of ovarian cyst and kidney stones.  Patient does have fever today without any other significant symptoms. No complaints of cough, clear lung sounds normal O2 saturation. Patient does have slight tachycardia. Suspect possible viral  illness.        Date: 03/29/2012  Rate: 125  Rhythm: sinus tachycardia  QRS Axis: normal  Intervals: normal  ST/T Wave abnormalities: normal  Conduction Disutrbances:none  Narrative Interpretation:   Old EKG Reviewed: none available    Martie [place], [place] 03/29/12 [phone]

## 2012-03-29 NOTE — ED Provider Notes (Signed)
History     CSN: [id number]  Arrival date & time 03/29/12  0347   First MD Initiated Contact with Patient 03/29/12 [phone]      Chief Complaint  Patient presents with  . Chills  . Flank Pain    (Consider location/radiation/quality/duration/timing/severity/associated sxs/prior treatment) HPI  Past Medical History  Diagnosis Date  . Epilepsy   . Seizures     Past Surgical History  Procedure Date  . Cesarean section   . Tubal ligation   . Right oophorectomy     History reviewed. No pertinent family history.  History  Substance Use Topics  . Smoking status: Never Smoker   . Smokeless tobacco: Not on file  . Alcohol Use: No    OB History    Grav Para Term Preterm Abortions TAB SAB Ect Mult Living                  Review of Systems  Allergies  Aspirin  Home Medications   Current Outpatient Rx  Name Route Sig Dispense Refill  . ZONISAMIDE 100 MG PO CAPS Oral Take 400 mg by mouth at bedtime.      BP 124/49  Pulse 113  Temp(Src) 98.5 F (36.9 C) (Oral)  Resp 18  Ht 5' 7"  (1.702 m)  Wt 325 lb (147.419 kg)  BMI 50.90 kg/m2  SpO2 95%  LMP 03/16/2012  Physical Exam  ED Course  Procedures (including critical care time)  Labs Reviewed  CBC - Abnormal; Notable for the following:    WBC 12.9 (*)    Hemoglobin 11.5 (*)    HCT 35.9 (*)    MCH 25.1 (*)    All other components within normal limits  DIFFERENTIAL - Abnormal; Notable for the following:    Neutrophils Relative 92 (*)    Neutro Abs 11.9 (*)    Lymphocytes Relative 7 (*)    Monocytes Relative 1 (*)    All other components within normal limits  BASIC METABOLIC PANEL - Abnormal; Notable for the following:    Glucose, Bld 173 (*)    All other components within normal limits  URINALYSIS, ROUTINE W REFLEX MICROSCOPIC  PREGNANCY, URINE  PREGNANCY, URINE   Ct Abdomen Pelvis Wo Contrast  03/29/2012  *RADIOLOGY REPORT*  Clinical Data: Left and right flank pain radiating to the back and lower  abdomen.  Chills and generalized aching.  History of previous renal stones.  CT ABDOMEN AND PELVIS WITHOUT CONTRAST  Technique:  Multidetector CT imaging of the abdomen and pelvis was performed following the standard protocol without intravenous contrast.  Comparison: None.  Findings: Lung bases are clear.  Four stones in the left kidney, largest measuring 5 mm diameter. No right renal stones, ureteral stones, or bladder stones visualized.  No pyelocaliectasis or ureterectasis.  Diffuse low attenuation throughout the liver consistent with fatty infiltration.  Unenhanced appearance of the spleen, gallbladder, pancreas, adrenal glands, abdominal aorta, and retroperitoneal lymph nodes is unremarkable.  The stomach, small bowel, and colon are not distended.  No free air or free fluid in the abdomen. Small umbilical hernia containing fat.  Pelvis:  The bladder wall is not thickened.  Right ovary is prominent in size, measuring about 5.4 x 4.7 cm.  Consider ultrasound for better characterization.  The uterus and left ovary are not enlarged.  No significant pelvic lymphadenopathy. Calcifications consistent with phleboliths.  The appendix is normal.  No free or loculated pelvic fluid collections.  Mild degenerative changes in the lumbar spine.  IMPRESSION: Multiple nonobstructing intrarenal stones on the left.  No pyelocaliectasis or ureterectasis.  Fatty infiltration of the liver.  Enlarged right ovary.  Consider pelvic ultrasound for further characterization of the right ovary.  Original Report Authenticated By: Neale Burly, M.D.     1. Viral infection   2. Ovarian cyst       MDM  Temp decreased and heart rate decreased with tylenol.   Most likely viral illness.  Pt advisd tylenol every 4 hours        [place], [place] 03/29/12 [phone]

## 2012-04-07 ENCOUNTER — Ambulatory Visit: Payer: Self-pay | Admitting: Urology

## 2012-08-23 ENCOUNTER — Emergency Department (HOSPITAL_COMMUNITY)
Admission: EM | Admit: 2012-08-23 | Discharge: 2012-08-23 | Disposition: A | Discharge status: Home / Self Care | Payer: Managed Care, Other (non HMO) | Attending: Emergency Medicine | Admitting: Emergency Medicine

## 2012-08-23 ENCOUNTER — Encounter (HOSPITAL_COMMUNITY): Payer: Self-pay | Admitting: Emergency Medicine

## 2012-08-23 DIAGNOSIS — M545 Low back pain, unspecified: Secondary | ICD-10-CM | POA: Insufficient documentation

## 2012-08-23 DIAGNOSIS — R109 Unspecified abdominal pain: Secondary | ICD-10-CM | POA: Insufficient documentation

## 2012-08-23 LAB — URINALYSIS, ROUTINE W REFLEX MICROSCOPIC
Bilirubin Urine: NEGATIVE
Hgb urine dipstick: NEGATIVE
Specific Gravity, Urine: 1.023 (ref 1.005–1.030)
Urobilinogen, UA: 1 mg/dL (ref 0.0–1.0)

## 2012-08-23 MED ORDER — TRAMADOL HCL 50 MG PO TABS: 50.0000 mg | ORAL_TABLET | Freq: Four times a day (QID) | ORAL | Status: DC | PRN

## 2012-08-23 MED ORDER — ORPHENADRINE CITRATE ER 100 MG PO TB12: 100.0000 mg | ORAL_TABLET | Freq: Two times a day (BID) | ORAL | Status: DC

## 2012-08-23 NOTE — ED Provider Notes (Signed)
History     CSN: [id number]  Arrival date & time 08/23/12  1709   First MD Initiated Contact with Patient 08/23/12 1854      Chief Complaint  Patient presents with  . Back Pain  . Abdominal Pain    (Consider location/radiation/quality/duration/timing/severity/associated sxs/prior treatment) Patient is a 36 y.o. female presenting with back pain and abdominal pain. The history is provided by the patient.  Back Pain  This is a chronic problem. The current episode started more than 1 week ago. The problem occurs constantly. The pain is associated with no known injury. Associated symptoms include abdominal pain. Pertinent negatives include no fever, no numbness and no headaches. Associated symptoms comments: Pain in left lower back around to lower abdomen since January of this year. No new symptoms, no injury she is aware of and no change in intensity of pain. She denies N, V, fever, dysuria, or extremity weakness. She does report some numbness that extends into left leg. .  Abdominal Pain The primary symptoms of the illness include abdominal pain. The primary symptoms of the illness do not include fever or nausea.  Additional symptoms associated with the illness include back pain. Symptoms associated with the illness do not include chills.    Past Medical History  Diagnosis Date  . Epilepsy   . Seizures     Past Surgical History  Procedure Date  . Cesarean section   . Tubal ligation   . Right oophorectomy     History reviewed. No pertinent family history.  History  Substance Use Topics  . Smoking status: Never Smoker   . Smokeless tobacco: Not on file  . Alcohol Use: No    OB History    Grav Para Term Preterm Abortions TAB SAB Ect Mult Living                  Review of Systems  Constitutional: Negative for fever and chills.  HENT: Negative.   Respiratory: Negative.   Cardiovascular: Negative.   Gastrointestinal: Positive for abdominal pain. Negative for nausea.    Musculoskeletal: Positive for back pain.  Skin: Negative.   Neurological: Negative.  Negative for numbness and headaches.    Allergies  Aspirin  Home Medications   Current Outpatient Rx  Name Route Sig Dispense Refill  . ZONISAMIDE 100 MG PO CAPS Oral Take 400 mg by mouth at bedtime.      BP 105/58  Pulse 92  Temp 98.6 F (37 C) (Oral)  Resp 20  SpO2 100%  LMP 08/02/2012  Physical Exam  Constitutional: She is oriented to person, place, and time. She appears well-developed and well-nourished.  HENT:  Head: Normocephalic.  Neck: Normal range of motion. Neck supple.  Cardiovascular: Normal rate and regular rhythm.   Pulmonary/Chest: Effort normal and breath sounds normal.  Abdominal: Soft. Bowel sounds are normal. There is no tenderness. There is no rebound and no guarding.       Morbidly obese abdomen.  Musculoskeletal: Normal range of motion.       Mild lumbar and paralumbar tenderness without swelling or discoloration.  Neurological: She is alert and oriented to person, place, and time. She has normal reflexes. Coordination normal.  Skin: Skin is warm and dry. No rash noted.       Red plaque like rash in fold of pannus left lower abdomen c/w candidal rash.   Psychiatric: She has a normal mood and affect.    ED Course  Procedures (including critical care time)  Labs Reviewed  URINALYSIS, ROUTINE W REFLEX MICROSCOPIC  POCT PREGNANCY, URINE   No results found.   No diagnosis found.  1. Chronic low back pain 2.   MDM  She reports full evaluations by GYN, urology and primary care for same symptoms. Recommended follow up with orthopedics to pursue possible radicular symptoms. Will give Ultram and Norflex and discharge home.        Leotis Shames, PA-C 08/23/12 2000

## 2012-08-23 NOTE — ED Notes (Signed)
Pt states that she has been having lower abdominal pain/low back pain since January of this year.  Pt has been seen both here and at her PCP/gyno/urologist for this issue.  Was told it was an ovarian cyst.

## 2012-08-23 NOTE — ED Notes (Signed)
Pt states she has had LLQ pain for almost a year.  Pt has been to PCP, her OBGYN and the ED several times for same.  Pt states pain has grown increasingly worse over the last several days and was so severe today that she couldn't walk upright.  Pt denies N/V and fever, but states she has had some loose stools over last 2 weeks.  Pt has area of redness to left groin.  Pt states she has had discharge "seeping," from area and there is an unpleasant odor to area.  Pt's lung sounds are clear and she has good peripheral pulses.  Bowel sounds are present.  Pt denies blood in urine or vaginal discharge.

## 2012-08-25 NOTE — ED Provider Notes (Signed)
Medical screening examination/treatment/procedure(s) were performed by non-physician practitioner and as supervising physician I was immediately available for consultation/collaboration.  Jasper Riling. Alvino Chapel, MD 08/25/12 9800

## 2012-09-16 ENCOUNTER — Ambulatory Visit: Payer: Self-pay | Admitting: Urology

## 2012-09-18 ENCOUNTER — Ambulatory Visit: Payer: Self-pay | Admitting: Urology

## 2012-09-24 ENCOUNTER — Ambulatory Visit: Payer: Self-pay | Admitting: Urology

## 2013-04-09 ENCOUNTER — Ambulatory Visit: Payer: Self-pay | Admitting: Family Medicine

## 2014-03-02 LAB — HM PAP SMEAR: HM Pap smear: NORMAL

## 2014-09-14 LAB — HEMOGLOBIN A1C: HEMOGLOBIN A1C: 9 % — AB (ref 4.0–6.0)

## 2014-10-15 LAB — CBC AND DIFFERENTIAL
NEUTROS ABS: 8 /uL
WBC: 11.3 10^3/mL

## 2014-10-15 LAB — LIPID PANEL
CHOLESTEROL: 227 mg/dL — AB (ref 0–200)
HDL: 37 mg/dL (ref 35–70)
LDL Cholesterol: 166 mg/dL
LDl/HDL Ratio: 4.5
TRIGLYCERIDES: 120 mg/dL (ref 40–160)

## 2014-10-15 LAB — HEPATIC FUNCTION PANEL
ALT: 34 U/L (ref 7–35)
AST: 28 U/L (ref 13–35)
Alkaline Phosphatase: 128 U/L — AB (ref 25–125)
Bilirubin, Total: 0.3 mg/dL

## 2014-10-15 LAB — BASIC METABOLIC PANEL
BUN: 8 mg/dL (ref 4–21)
CREATININE: 0.8 mg/dL (ref 0.5–1.1)
Glucose: 284 mg/dL
Sodium: 139 mmol/L (ref 137–147)

## 2014-10-15 LAB — TSH: TSH: 1.13 u[IU]/mL (ref 0.41–5.90)

## 2014-10-26 ENCOUNTER — Encounter: Payer: Self-pay | Admitting: *Deleted

## 2014-11-04 ENCOUNTER — Ambulatory Visit: Payer: Managed Care, Other (non HMO) | Admitting: Cardiovascular Disease

## 2014-11-16 ENCOUNTER — Encounter: Payer: Self-pay | Admitting: Cardiovascular Disease

## 2014-11-16 ENCOUNTER — Encounter (INDEPENDENT_AMBULATORY_CARE_PROVIDER_SITE_OTHER): Payer: Self-pay

## 2014-11-16 ENCOUNTER — Ambulatory Visit (INDEPENDENT_AMBULATORY_CARE_PROVIDER_SITE_OTHER): Payer: BLUE CROSS/BLUE SHIELD | Admitting: Cardiovascular Disease

## 2014-11-16 VITALS — BP 100/72 | HR 101 | Ht 67.0 in | Wt 322.0 lb

## 2014-11-16 DIAGNOSIS — R079 Chest pain, unspecified: Secondary | ICD-10-CM

## 2014-11-16 NOTE — Patient Instructions (Addendum)
Your physician has requested that you have an exercise tolerance test. For further information please visit [email or web address]. Please also follow instruction sheet, as given. -wear walking shoes  -wear comfortable cloths  -eat a light breakfast  -take your medication   Your physician has requested that you have an echocardiogram. Echocardiography is a painless test that uses sound waves to create images of your heart. It provides your doctor with information about the size and shape of your heart and how well your heart's chambers and valves are working. This procedure takes approximately one hour. There are no restrictions for this procedure.  Your physician recommends that you schedule a follow-up appointment in:  As needed

## 2014-11-18 ENCOUNTER — Encounter: Payer: Self-pay | Admitting: Cardiovascular Disease

## 2014-11-18 DIAGNOSIS — R079 Chest pain, unspecified: Secondary | ICD-10-CM | POA: Insufficient documentation

## 2014-11-18 NOTE — Assessment & Plan Note (Signed)
The chest pain is overall atypical and could be related to stress. She does have some risk factors for coronary artery disease with exertional dyspnea. Thus, I requested a treadmill stress test for evaluation. I also requested an echocardiogram to evaluate for pulmonary hypertension given that she has known history of sleep apnea. I discussed with the patient the importance of lifestyle changes in order to decrease the chance of future coronary artery disease and cardiovascular events. We discussed the importance of controlling risk factors, healthy diet as well as regular exercise. I also explained to him that a normal stress test does not rule out atherosclerosis.

## 2014-11-18 NOTE — Progress Notes (Signed)
Primary care physician: Dr. Ancil Boozer  HPI  This is a 39 year old female who was referred for evaluation of chest pain. She has no previous cardiac history. She has known history of type 2 diabetes recently diagnosed, obesity and sleep apnea on CPAP treatment. She is not a smoker. She does have family history of coronary artery disease. She had recent episodes of left-sided sharp and aching chest pain radiating to the left arm. She has been under significant stress lately related to work issues. She denies any heartburn. She reports exertional fatigue with mild exertional dyspnea.  Allergies  Allergen Reactions  . Aspirin Other (See Comments)    Does take because of her epilepsy/seizure      Current Outpatient Prescriptions on File Prior to Visit  Medication Sig Dispense Refill  . Canagliflozin-Metformin HCl (INVOKAMET) 150-500 MG TABS Take by mouth 2 (two) times daily.    . Exenatide ER (BYDUREON) 2 MG PEN Inject into the skin once a week.    . traMADol (ULTRAM) 50 MG tablet Take 1 tablet (50 mg total) by mouth every 6 (six) hours as needed for pain. 15 tablet 0  . zonisamide (ZONEGRAN) 100 MG capsule Take 400 mg by mouth at bedtime.     No current facility-administered medications on file prior to visit.     Past Medical History  Diagnosis Date  . Epilepsy   . Seizures   . Galactorrhea   . Epilepsy   . Obesity   . Tachycardia   . Allergic rhinitis   . Hx gestational diabetes   . Dyslipidemia   . Sleep apnea   . Diabetes mellitus without complication   . Kidney stones   . Syncope and collapse      Past Surgical History  Procedure Laterality Date  . Cesarean section    . Tubal ligation    . Right oophorectomy       Family History  Problem Relation Age of Onset  . Family history unknown: Yes     History   Social History  . Marital Status: Married    Spouse Name: N/A    Number of Children: N/A  . Years of Education: N/A   Occupational History  . Not on  file.   Social History Main Topics  . Smoking status: Never Smoker   . Smokeless tobacco: Not on file  . Alcohol Use: No  . Drug Use: No  . Sexual Activity: Not on file   Other Topics Concern  . Not on file   Social History Narrative     ROS A 10 point review of system was performed. It is negative other than that mentioned in the history of present illness.   PHYSICAL EXAM   BP 100/72 mmHg  Pulse 101  Ht 5' 7"  (1.702 m)  Wt 322 lb (146.058 kg)  BMI 50.42 kg/m2  Constitutional: She is oriented to person, place, and time. She appears well-developed and well-nourished. No distress.  HENT: No nasal discharge.  Head: Normocephalic and atraumatic.  Eyes: Pupils are equal and round. No discharge.  Neck: Normal range of motion. Neck supple. No JVD present. No thyromegaly present.  Cardiovascular: Normal rate, regular rhythm, normal heart sounds. Exam reveals no gallop and no friction rub. No murmur heard.  Pulmonary/Chest: Effort normal and breath sounds normal. No stridor. No respiratory distress. She has no wheezes. She has no rales. She exhibits no tenderness.  Abdominal: Soft. Bowel sounds are normal. She exhibits no distension. There is no tenderness.  There is no rebound and no guarding.  Musculoskeletal: Normal range of motion. She exhibits no edema and no tenderness.  Neurological: She is alert and oriented to person, place, and time. Coordination normal.  Skin: Skin is warm and dry. No rash noted. She is not diaphoretic. No erythema. No pallor.  Psychiatric: She has a normal mood and affect. Her behavior is normal. Judgment and thought content normal.    LWH:KNZUD  Tachycardia  Low voltage in precordial leads.   ABNORMAL     ASSESSMENT AND PLAN

## 2014-11-30 ENCOUNTER — Other Ambulatory Visit: Payer: BLUE CROSS/BLUE SHIELD

## 2014-12-07 ENCOUNTER — Other Ambulatory Visit: Payer: BLUE CROSS/BLUE SHIELD

## 2014-12-07 ENCOUNTER — Encounter: Payer: Self-pay | Admitting: *Deleted

## 2014-12-09 ENCOUNTER — Encounter: Payer: BLUE CROSS/BLUE SHIELD | Admitting: Cardiovascular Disease

## 2014-12-10 ENCOUNTER — Ambulatory Visit (INDEPENDENT_AMBULATORY_CARE_PROVIDER_SITE_OTHER): Payer: BLUE CROSS/BLUE SHIELD | Admitting: Cardiovascular Disease

## 2014-12-10 DIAGNOSIS — R079 Chest pain, unspecified: Secondary | ICD-10-CM

## 2014-12-10 NOTE — Procedures (Signed)
Treadmill ordered for recent epsiodes of chest pain.  Resting EKG shows NSR with rate of 108 bpm, no significant ST or T-wave changes Resting blood pressure of 123/90 Stand bruce protocal was used.  Patient exercised for 3 min 40 sec,  Peak heart rate of 173 bpm.  This was 94% of the maximum predicted heart rate (183). Achieved 5.3 METS No symptoms of chest pain or lightheadedness were reported at peak stress or in recovery.  Peak Blood pressure recorded was 159/87 Heart rate at 3 minutes in recovery was 116 bpm. No significant ST changes concerning for ischemia at peak stress or in recovery  FINAL IMPRESSION: Normal exercise stress test. No significant EKG changes concerning for ischemia. Poor exercise tolerance. Rapid acceleration of heart rate with minimal exertion.

## 2014-12-10 NOTE — Patient Instructions (Signed)
Your stress test shows no significant EKG changes concerning for ischemia Results will be sent to Dr. Fletcher Anon

## 2014-12-27 ENCOUNTER — Other Ambulatory Visit: Payer: BLUE CROSS/BLUE SHIELD

## 2014-12-27 ENCOUNTER — Encounter: Payer: Self-pay | Admitting: *Deleted

## 2014-12-27 DIAGNOSIS — R0989 Other specified symptoms and signs involving the circulatory and respiratory systems: Secondary | ICD-10-CM

## 2015-04-15 ENCOUNTER — Emergency Department (HOSPITAL_COMMUNITY): Payer: BLUE CROSS/BLUE SHIELD

## 2015-04-15 ENCOUNTER — Encounter (HOSPITAL_COMMUNITY): Payer: Self-pay | Admitting: Emergency Medicine

## 2015-04-15 ENCOUNTER — Emergency Department (HOSPITAL_COMMUNITY)
Admission: EM | Admit: 2015-04-15 | Discharge: 2015-04-16 | Disposition: A | Discharge status: Home / Self Care | Payer: BLUE CROSS/BLUE SHIELD | Attending: Emergency Medicine | Admitting: Emergency Medicine

## 2015-04-15 DIAGNOSIS — Z8742 Personal history of other diseases of the female genital tract: Secondary | ICD-10-CM | POA: Diagnosis not present

## 2015-04-15 DIAGNOSIS — R05 Cough: Secondary | ICD-10-CM | POA: Insufficient documentation

## 2015-04-15 DIAGNOSIS — E119 Type 2 diabetes mellitus without complications: Secondary | ICD-10-CM | POA: Insufficient documentation

## 2015-04-15 DIAGNOSIS — R110 Nausea: Secondary | ICD-10-CM | POA: Insufficient documentation

## 2015-04-15 DIAGNOSIS — Z79899 Other long term (current) drug therapy: Secondary | ICD-10-CM | POA: Insufficient documentation

## 2015-04-15 DIAGNOSIS — R0602 Shortness of breath: Secondary | ICD-10-CM | POA: Insufficient documentation

## 2015-04-15 DIAGNOSIS — M79604 Pain in right leg: Secondary | ICD-10-CM | POA: Diagnosis not present

## 2015-04-15 DIAGNOSIS — Z8709 Personal history of other diseases of the respiratory system: Secondary | ICD-10-CM | POA: Insufficient documentation

## 2015-04-15 DIAGNOSIS — Z87442 Personal history of urinary calculi: Secondary | ICD-10-CM | POA: Insufficient documentation

## 2015-04-15 DIAGNOSIS — Z8669 Personal history of other diseases of the nervous system and sense organs: Secondary | ICD-10-CM | POA: Insufficient documentation

## 2015-04-15 DIAGNOSIS — R079 Chest pain, unspecified: Secondary | ICD-10-CM | POA: Diagnosis present

## 2015-04-15 DIAGNOSIS — R0789 Other chest pain: Secondary | ICD-10-CM | POA: Insufficient documentation

## 2015-04-15 NOTE — ED Notes (Signed)
Bed: TH43 Expected date:  Expected time:  Means of arrival:  Comments: CLEANING IT!!!! <3 KB

## 2015-04-15 NOTE — ED Notes (Signed)
Patient transported to X-ray 

## 2015-04-15 NOTE — ED Notes (Signed)
Pt traveled to [employer] weekend,  She is sob even when speaking,  Pt said her leg (r) was hurting and then it felt like it traveled up to her right arm and shoulder and hurt in back and felt as heart was cramping

## 2015-04-15 NOTE — ED Notes (Signed)
Writer went to draw pts blood, pt is currently in xray.

## 2015-04-15 NOTE — ED Notes (Signed)
Pt presents with posterior CP onset yesterday after having episode of R leg pain, pt continues to be shob, worse with exertion.

## 2015-04-16 LAB — BASIC METABOLIC PANEL
ANION GAP: 9 (ref 5–15)
BUN: 10 mg/dL (ref 6–20)
CALCIUM: 8.8 mg/dL — AB (ref 8.9–10.3)
CHLORIDE: 105 mmol/L (ref 101–111)
CO2: 21 mmol/L — ABNORMAL LOW (ref 22–32)
Creatinine, Ser: 0.63 mg/dL (ref 0.44–1.00)
GFR calc non Af Amer: >60 mL/min (ref >60)
Glucose, Bld: 288 mg/dL — ABNORMAL HIGH (ref 65–99)
Potassium: 3.6 mmol/L (ref 3.5–5.1)
Sodium: 135 mmol/L (ref 135–145)

## 2015-04-16 LAB — CBC WITH DIFFERENTIAL/PLATELET
BASOS PCT: 0 % (ref 0–1)
Basophils Absolute: 0 10*3/uL (ref 0.0–0.1)
EOS PCT: 0 % (ref 0–5)
Eosinophils Absolute: 0 10*3/uL (ref 0.0–0.7)
HEMATOCRIT: 35.9 % — AB (ref 36.0–46.0)
Hemoglobin: 11.9 g/dL — ABNORMAL LOW (ref 12.0–15.0)
LYMPHS ABS: 4.1 10*3/uL — AB (ref 0.7–4.0)
LYMPHS PCT: 27 % (ref 12–46)
MCH: 26.4 pg (ref 26.0–34.0)
MCHC: 33.1 g/dL (ref 30.0–36.0)
MCV: 79.6 fL (ref 78.0–100.0)
MONOS PCT: 5 % (ref 3–12)
Monocytes Absolute: 0.8 10*3/uL (ref 0.1–1.0)
Neutro Abs: 10.1 10*3/uL — ABNORMAL HIGH (ref 1.7–7.7)
Neutrophils Relative %: 68 % (ref 43–77)
PLATELETS: 291 10*3/uL (ref 150–400)
RBC: 4.51 MIL/uL (ref 3.87–5.11)
RDW: 13.6 % (ref 11.5–15.5)
WBC: 15 10*3/uL — ABNORMAL HIGH (ref 4.0–10.5)

## 2015-04-16 LAB — D-DIMER, QUANTITATIVE: D-Dimer, Quant: 0.39 ug/mL-FEU (ref 0.00–0.48)

## 2015-04-16 LAB — TROPONIN I

## 2015-04-16 MED ORDER — IBUPROFEN 600 MG PO TABS: 600.0000 mg | ORAL_TABLET | Freq: Four times a day (QID) | ORAL | Status: DC | PRN

## 2015-04-16 MED ORDER — OXYCODONE-ACETAMINOPHEN 5-325 MG PO TABS: 1.0000 | ORAL_TABLET | Freq: Four times a day (QID) | ORAL | Status: DC | PRN

## 2015-04-16 MED ORDER — OXYCODONE-ACETAMINOPHEN 5-325 MG PO TABS
1.0000 | ORAL_TABLET | Freq: Once | ORAL | Status: AC
  Administered 2015-04-16: 1 via ORAL
  Filled 2015-04-16: qty 1

## 2015-04-16 NOTE — ED Provider Notes (Signed)
CSN: [id number]     Arrival date & time 04/15/15  2202 History   First MD Initiated Contact with Patient 04/15/15 2356     Chief Complaint  Patient presents with  . Chest Pain     (Consider location/radiation/quality/duration/timing/severity/associated sxs/prior Treatment) HPI  This is a 39 year old female who presents with right leg pain, chest pain, and back pain. Patient reports onset of symptoms earlier today. She states that she developed cramping in her right leg and she felt like it radiated up into her back and chest. She states "it feels like a knot.". She reports worsening shortness of breath. Worse with exertion.  Patient states that the pain comes and goes. She is currently pain-free. She reports nausea and a cough. No fevers. States that it felt better with her husband rubbed her back and chest. Patient denies any history of blood clots. Not on birth control. Did recently travel to [facility] over [place] weekend.  Past Medical History  Diagnosis Date  . Epilepsy   . Seizures   . Galactorrhea   . Epilepsy   . Obesity   . Tachycardia   . Allergic rhinitis   . Hx gestational diabetes   . Dyslipidemia   . Sleep apnea   . Diabetes mellitus without complication   . Kidney stones   . Syncope and collapse    Past Surgical History  Procedure Laterality Date  . Cesarean section    . Tubal ligation    . Right oophorectomy     Family History  Problem Relation Age of Onset  . Family history unknown: Yes   History  Substance Use Topics  . Smoking status: Never Smoker   . Smokeless tobacco: Not on file  . Alcohol Use: No   OB History    No data available     Review of Systems  Constitutional: Negative for fever.  Respiratory: Positive for cough, chest tightness and shortness of breath.   Cardiovascular: Positive for chest pain. Negative for leg swelling.  Gastrointestinal: Positive for nausea. Negative for vomiting and abdominal pain.  Genitourinary:  Negative for dysuria.  Musculoskeletal: Negative for back pain.       Right leg cramping  Skin: Negative for wound.  Neurological: Negative for headaches.  Psychiatric/Behavioral: Negative for confusion.  All other systems reviewed and are negative.     Allergies  Aspirin  Home Medications   Prior to Admission medications   Medication Sig Start Date End Date Taking? Authorizing Provider  zonisamide (ZONEGRAN) 100 MG capsule Take 400 mg by mouth at bedtime.   Yes Historical Provider, MD  aspirin 81 MG tablet Take 81 mg by mouth as needed for pain.    Historical Provider, MD  Canagliflozin-Metformin HCl (INVOKAMET) 150-500 MG TABS Take by mouth 2 (two) times daily.    Historical Provider, MD  Exenatide ER (BYDUREON) 2 MG PEN Inject into the skin once a week.    Historical Provider, MD  ibuprofen (ADVIL,MOTRIN) 600 MG tablet Take 1 tablet (600 mg total) by mouth every 6 (six) hours as needed. 04/16/15   Merryl Hacker, MD  oxyCODONE-acetaminophen (PERCOCET/ROXICET) 5-325 MG per tablet Take 1 tablet by mouth every 6 (six) hours as needed for severe pain. 04/16/15   Merryl Hacker, MD  traMADol (ULTRAM) 50 MG tablet Take 1 tablet (50 mg total) by mouth every 6 (six) hours as needed for pain. Patient not taking: Reported on 04/15/2015 08/23/12   Charlann Lange, PA-C   BP 104/66  mmHg  Pulse 96  Temp(Src) 98.1 F (36.7 C) (Oral)  Resp 19  Wt 322 lb (146.058 kg)  SpO2 99%  LMP 03/15/2015 Physical Exam  Constitutional: She is oriented to person, place, and time. No distress.  Morbidly obese  HENT:  Head: Normocephalic and atraumatic.  Cardiovascular: Normal rate, regular rhythm and normal heart sounds.   No murmur heard. Pulmonary/Chest: Effort normal and breath sounds normal. No respiratory distress. She has no wheezes. She exhibits tenderness.  Abdominal: Soft. Bowel sounds are normal. There is no tenderness. There is no rebound.  Musculoskeletal: She exhibits no edema.   Tenderness to palpation over the right lateral back and ribs  Neurological: She is alert and oriented to person, place, and time.  Skin: Skin is warm and dry.  Psychiatric: She has a normal mood and affect.  Nursing note and vitals reviewed.   ED Course  Procedures (including critical care time) Labs Review Labs Reviewed  CBC WITH DIFFERENTIAL/PLATELET - Abnormal; Notable for the following:    WBC 15.0 (*)    Hemoglobin 11.9 (*)    HCT 35.9 (*)    Neutro Abs 10.1 (*)    Lymphs Abs 4.1 (*)    All other components within normal limits  BASIC METABOLIC PANEL - Abnormal; Notable for the following:    CO2 21 (*)    Glucose, Bld 288 (*)    Calcium 8.8 (*)    All other components within normal limits  TROPONIN I  D-DIMER, QUANTITATIVE (NOT AT [facility])    Imaging Review Dg Chest 2 View  04/15/2015   CLINICAL DATA:  Right-sided chest pain and dyspnea.  EXAM: CHEST  2 VIEW  COMPARISON:  04/09/2013  FINDINGS: There is a shallow inspiration. The lungs are clear. The pulmonary vasculature is normal. There are no pleural effusions. Hilar, mediastinal and cardiac contours are unremarkable and unchanged.  IMPRESSION: No active cardiopulmonary disease.   Electronically Signed   By: Andreas Newport M.D.   On: 04/15/2015 23:53     EKG Interpretation   Date/Time:  Friday April 15 2015 22:09:29 EDT Ventricular Rate:  101 PR Interval:  158 QRS Duration: 96 QT Interval:  332 QTC Calculation: 430 R Axis:   38 Text Interpretation:  Sinus tachycardia Borderline T abnormalities,  anterior leads since last tracing no significant change Confirmed by  MILLER  MD, BRIAN ([id number]) on 04/15/2015 11:51:11 PM      MDM   Final diagnoses:  Chest pain, unspecified chest pain type    Patient presents with chest pain. Ongoing for over 24 hours. Comes and goes. Associated leg cramping. Low risk for blood clot. Has had some recent travel. She has some reproducible pain on exam. ACS risk factors include  diabetes and hyperlipidemia as well as morbid obesity. EKG shows no evidence of ischemia. She was tachycardic to 100 on admission. For this reason, d-dimer was sent. She is otherwise low risk. D-dimer is negative. Troponin is negative. Patient given pain medication with some improvement. Suspect musculoskeletal etiology; however, given patient's risk factors, would have her follow-up with cardiology as an outpatient. Patient was given return precautions.  After history, exam, and medical workup I feel the patient has been appropriately medically screened and is safe for discharge home. Pertinent diagnoses were discussed with the patient. Patient was given return precautions.   Merryl Hacker, MD 04/16/15 [phone]

## 2015-04-16 NOTE — ED Notes (Signed)
MD at bedside. 

## 2015-04-16 NOTE — Discharge Instructions (Signed)
You were seen today for chest pain. Her lab work and workup is reassuring. This is likely musculoskeletal. However, given your risk factors, with heavy follow-up with cardiology for stress testing. If you have any new or worsening symptoms, you should be reevaluated.  Chest Pain (Nonspecific) It is often hard to give a specific diagnosis for the cause of chest pain. There is always a chance that your pain could be related to something serious, such as a heart attack or a blood clot in the lungs. You need to follow up with your health care provider for further evaluation. CAUSES   Heartburn.  Pneumonia or bronchitis.  Anxiety or stress.  Inflammation around your heart (pericarditis) or lung (pleuritis or pleurisy).  A blood clot in the lung.  A collapsed lung (pneumothorax). It can develop suddenly on its own (spontaneous pneumothorax) or from trauma to the chest.  Shingles infection (herpes zoster virus). The chest wall is composed of bones, muscles, and cartilage. Any of these can be the source of the pain.  The bones can be bruised by injury.  The muscles or cartilage can be strained by coughing or overwork.  The cartilage can be affected by inflammation and become sore (costochondritis). DIAGNOSIS  Lab tests or other studies may be needed to find the cause of your pain. Your health care provider may have you take a test called an ambulatory electrocardiogram (ECG). An ECG records your heartbeat patterns over a 24-hour period. You may also have other tests, such as:  Transthoracic echocardiogram (TTE). During echocardiography, sound waves are used to evaluate how blood flows through your heart.  Transesophageal echocardiogram (TEE).  Cardiac monitoring. This allows your health care provider to monitor your heart rate and rhythm in real time.  Holter monitor. This is a portable device that records your heartbeat and can help diagnose heart arrhythmias. It allows your health care  provider to track your heart activity for several days, if needed.  Stress tests by exercise or by giving medicine that makes the heart beat faster. TREATMENT   Treatment depends on what may be causing your chest pain. Treatment may include:  Acid blockers for heartburn.  Anti-inflammatory medicine.  Pain medicine for inflammatory conditions.  Antibiotics if an infection is present.  You may be advised to change lifestyle habits. This includes stopping smoking and avoiding alcohol, caffeine, and chocolate.  You may be advised to keep your head raised (elevated) when sleeping. This reduces the chance of acid going backward from your stomach into your esophagus. Most of the time, nonspecific chest pain will improve within 2-3 days with rest and mild pain medicine.  HOME CARE INSTRUCTIONS   If antibiotics were prescribed, take them as directed. Finish them even if you start to feel better.  For the next few days, avoid physical activities that bring on chest pain. Continue physical activities as directed.  Do not use any tobacco products, including cigarettes, chewing tobacco, or electronic cigarettes.  Avoid drinking alcohol.  Only take medicine as directed by your health care provider.  Follow your health care provider's suggestions for further testing if your chest pain does not go away.  Keep any follow-up appointments you made. If you do not go to an appointment, you could develop lasting (chronic) problems with pain. If there is any problem keeping an appointment, call to reschedule. SEEK MEDICAL CARE IF:   Your chest pain does not go away, even after treatment.  You have a rash with blisters on your chest.  You have a fever. SEEK IMMEDIATE MEDICAL CARE IF:   You have increased chest pain or pain that spreads to your arm, neck, jaw, back, or abdomen.  You have shortness of breath.  You have an increasing cough, or you cough up blood.  You have severe back or  abdominal pain.  You feel nauseous or vomit.  You have severe weakness.  You faint.  You have chills. This is an emergency. Do not wait to see if the pain will go away. Get medical help at once. Call your local emergency services (911 in [place] Do not drive yourself to the hospital. MAKE SURE YOU:   Understand these instructions.  Will watch your condition.  Will get help right away if you are not doing well or get worse. Document Released: 08/01/2005 Document Revised: 10/27/2013 Document Reviewed: 05/27/2008 [facility] Patient Information 2015 [place], [place]. This information is not intended to replace advice given to you by your health care provider. Make sure you discuss any questions you have with your health care provider.

## 2015-04-18 ENCOUNTER — Telehealth: Payer: Self-pay

## 2015-04-18 NOTE — Telephone Encounter (Signed)
Pt notified is feeling better, we will try to schedule f/u appointment this week

## 2015-04-20 ENCOUNTER — Encounter: Payer: Self-pay | Admitting: Family Medicine

## 2015-04-20 DIAGNOSIS — E8881 Metabolic syndrome: Secondary | ICD-10-CM | POA: Insufficient documentation

## 2015-04-20 DIAGNOSIS — J309 Allergic rhinitis, unspecified: Secondary | ICD-10-CM | POA: Insufficient documentation

## 2015-04-20 DIAGNOSIS — E668 Other obesity: Secondary | ICD-10-CM | POA: Insufficient documentation

## 2015-04-20 DIAGNOSIS — F419 Anxiety disorder, unspecified: Secondary | ICD-10-CM

## 2015-04-20 DIAGNOSIS — K7581 Nonalcoholic steatohepatitis (NASH): Secondary | ICD-10-CM | POA: Insufficient documentation

## 2015-04-20 DIAGNOSIS — Z91018 Allergy to other foods: Secondary | ICD-10-CM | POA: Insufficient documentation

## 2015-04-20 DIAGNOSIS — N200 Calculus of kidney: Secondary | ICD-10-CM | POA: Insufficient documentation

## 2015-04-20 DIAGNOSIS — F329 Major depressive disorder, single episode, unspecified: Secondary | ICD-10-CM | POA: Insufficient documentation

## 2015-04-20 DIAGNOSIS — K219 Gastro-esophageal reflux disease without esophagitis: Secondary | ICD-10-CM | POA: Insufficient documentation

## 2015-04-20 DIAGNOSIS — E1129 Type 2 diabetes mellitus with other diabetic kidney complication: Secondary | ICD-10-CM | POA: Insufficient documentation

## 2015-04-20 DIAGNOSIS — F32A Depression, unspecified: Secondary | ICD-10-CM | POA: Insufficient documentation

## 2015-04-20 DIAGNOSIS — G40409 Other generalized epilepsy and epileptic syndromes, not intractable, without status epilepticus: Secondary | ICD-10-CM | POA: Insufficient documentation

## 2015-04-21 ENCOUNTER — Ambulatory Visit: Payer: Self-pay | Admitting: Family Medicine

## 2015-05-06 ENCOUNTER — Encounter: Payer: Self-pay | Admitting: Family Medicine

## 2015-05-06 ENCOUNTER — Ambulatory Visit (INDEPENDENT_AMBULATORY_CARE_PROVIDER_SITE_OTHER): Payer: BLUE CROSS/BLUE SHIELD | Admitting: Family Medicine

## 2015-05-06 VITALS — BP 132/82 | HR 112 | Temp 98.7°F | Resp 18 | Ht 67.0 in | Wt 323.2 lb

## 2015-05-06 DIAGNOSIS — R102 Pelvic and perineal pain: Secondary | ICD-10-CM | POA: Insufficient documentation

## 2015-05-06 DIAGNOSIS — D72829 Elevated white blood cell count, unspecified: Secondary | ICD-10-CM | POA: Diagnosis not present

## 2015-05-06 DIAGNOSIS — E1165 Type 2 diabetes mellitus with hyperglycemia: Secondary | ICD-10-CM | POA: Diagnosis not present

## 2015-05-06 DIAGNOSIS — F418 Other specified anxiety disorders: Secondary | ICD-10-CM | POA: Insufficient documentation

## 2015-05-06 LAB — POCT UA - MICROALBUMIN: Microalbumin Ur, POC: 50 mg/L

## 2015-05-06 MED ORDER — DULOXETINE HCL 30 MG PO CPEP: 30.0000 mg | ORAL_CAPSULE | Freq: Every day | ORAL | Status: DC

## 2015-05-06 MED ORDER — METFORMIN HCL ER 500 MG PO TB24: 500.0000 mg | ORAL_TABLET | Freq: Every day | ORAL | Status: DC

## 2015-05-06 MED ORDER — LIRAGLUTIDE 18 MG/3ML ~~LOC~~ SOPN: 1.8000 mg | PEN_INJECTOR | Freq: Every day | SUBCUTANEOUS | Status: DC

## 2015-05-06 NOTE — Addendum Note (Signed)
Addended by: Gerald Leitz A on: 05/06/2015 03:22 PM   Modules accepted: Orders

## 2015-05-06 NOTE — Patient Instructions (Signed)
Generalized Anxiety Disorder Generalized anxiety disorder (GAD) is a mental disorder. It interferes with life functions, including relationships, work, and school. GAD is different from normal anxiety, which everyone experiences at some point in their lives in response to specific life events and activities. Normal anxiety actually helps us prepare for and get through these life events and activities. Normal anxiety goes away after the event or activity is over.  GAD causes anxiety that is not necessarily related to specific events or activities. It also causes excess anxiety in proportion to specific events or activities. The anxiety associated with GAD is also difficult to control. GAD can vary from mild to severe. People with severe GAD can have intense waves of anxiety with physical symptoms (panic attacks).  SYMPTOMS The anxiety and worry associated with GAD are difficult to control. This anxiety and worry are related to many life events and activities and also occur more days than not for 6 months or longer. People with GAD also have three or more of the following symptoms (one or more in children):  Restlessness.   Fatigue.  Difficulty concentrating.   Irritability.  Muscle tension.  Difficulty sleeping or unsatisfying sleep. DIAGNOSIS GAD is diagnosed through an assessment by your health care provider. Your health care provider will ask you questions aboutyour mood,physical symptoms, and events in your life. Your health care provider may ask you about your medical history and use of alcohol or drugs, including prescription medicines. Your health care provider may also do a physical exam and blood tests. Certain medical conditions and the use of certain substances can cause symptoms similar to those associated with GAD. Your health care provider may refer you to a mental health specialist for further evaluation. TREATMENT The following therapies are usually used to treat GAD:    Medication. Antidepressant medication usually is prescribed for long-term daily control. Antianxiety medicines may be added in severe cases, especially when panic attacks occur.   Talk therapy (psychotherapy). Certain types of talk therapy can be helpful in treating GAD by providing support, education, and guidance. A form of talk therapy called cognitive behavioral therapy can teach you healthy ways to think about and react to daily life events and activities.  Stress managementtechniques. These include yoga, meditation, and exercise and can be very helpful when they are practiced regularly. A mental health specialist can help determine which treatment is best for you. Some people see improvement with one therapy. However, other people require a combination of therapies. Document Released: 02/16/2013 Document Revised: 03/08/2014 Document Reviewed: 02/16/2013 [employer] Patient Information 2015 [employer]. This information is not intended to replace advice given to you by your health care provider. Make sure you discuss any questions you have with your health care provider.  

## 2015-05-06 NOTE — Progress Notes (Signed)
Name: Erin Good   MRN: [id number]    DOB: [birth year]   Date:05/06/2015       Progress Note  Subjective  Chief Complaint  Chief Complaint  Patient presents with  . Hospitalization Follow-up    Patient went to [facility] on 04/15/2015.  Marland Kitchen Chest Pain     Patient states that pain has reoccurred since the first day, she thinks that this may be anxiety. States that she has been under some stress. She states that pain occurs in center of her chest and takes her breath away.     HPI  Chest pain: she was seen at [facility] on 04/15/2015 but was not admitted , she went in for SOB with activity, chest pain and also had right leg pain the weeks preceding the event. D-dimer was neg, neg cardiac enzymes. WBC was high and she had hypocalcemia. She was given pain medication, she is feeling better now. Leg pain has improved . She still has intermittent chest pain and SOB but usually when she is stressed out. She is off this week and she is feeling well now.   DMII:  fsbs at home have been between 130's-150's, taking Bydureon weekly , she has little blisters on site of injection, she would like to try a different type. No polyphagia , but has polydipsia and nocturia. HgbA1C is still elevated  Pelvic pain: she states over the past few months she has constant pelvic pain, also heavier cycles, passing clots, and she has more cramping with cycles. Previous history of ovarian failure  Leukocytosis: going on for years, she is willing to see hematologist now. No family of blood disorder or leukemia.  Depression/Anxiety: she is always stressed because of work, she has symptoms consistent with panic attack.  Not on medications. She has insomnia.      Patient Active Problem List   Diagnosis Date Noted  . Allergic rhinitis 04/20/2015  . Anxiety and depression 04/20/2015  . 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 Gastro-esophageal reflux disease without esophagitis 04/20/2015  . Dysmetabolic  syndrome 31/49/7026  . Extreme obesity 04/20/2015  . NASH (nonalcoholic steatohepatitis) 04/20/2015  . Allergy to nuts 04/20/2015  . Calculus of kidney 04/20/2015  . Type 2 diabetes mellitus with hyperglycemia 04/20/2015  . Central sleep apnea 11/26/2008  . Dyslipidemia 07/02/2008  . Leukocytosis 07/29/2007    Past Surgical History  Procedure Laterality Date  . Cesarean section      X 2  . Tubal ligation  2007  . Right oophorectomy Right 2001    benign tumor    Family History  Problem Relation Age of Onset  . Diabetes Mother     History   Social History  . Marital Status: Married    Spouse Name: Roderic Palau  . Number of Children: 2  . Years of Education: College   Occupational History  . [employer]   Social History Main Topics  . Smoking status: Never Smoker   . Smokeless tobacco: Not on file  . Alcohol Use: No  . Drug Use: No  . Sexual Activity: Not on file   Other Topics Concern  . Not on file   Social History Narrative     Current outpatient prescriptions:  .  clonazePAM (KLONOPIN) 0.5 MG tablet, Take 1 tablet by mouth at bedtime as needed., Disp: , Rfl:  .  EPINEPHrine (EPIPEN 2-PAK) 0.3 mg/0.3 mL IJ SOAJ injection, Inject 1 pen as directed as needed., Disp: ,  Rfl:  .  Exenatide ER (BYDUREON) 2 MG PEN, Inject into the skin once a week., Disp: , Rfl:  .  glucose blood (ONETOUCH VERIO) test strip, 1 strip by Does not apply route daily., Disp: , Rfl:  .  ibuprofen (ADVIL,MOTRIN) 600 MG tablet, Take 1 tablet (600 mg total) by mouth every 6 (six) hours as needed., Disp: 30 tablet, Rfl: 0 .  zonisamide (ZONEGRAN) 100 MG capsule, Take 400 mg by mouth at bedtime., Disp: , Rfl:   Allergies  Allergen Reactions  . Aspirin Other (See Comments)    Does take because of her epilepsy/seizure   . Peanuts  [Peanut Oil]      ROS  Constitutional: Negative for fever or significant  weight change.  Respiratory: Negative for cough and shortness of  breath.   Cardiovascular: Negative for chest pain or palpitations.  Gastrointestinal: Negative for abdominal pain, no bowel changes.  Musculoskeletal: Negative for gait problem or joint swelling.  Skin: positive  for rash.  Neurological: Negative for dizziness or headache. Having seizure like activity , she has contacted her neurologist  No other specific complaints in a complete review of systems (except as listed in HPI above).   Objective  Filed Vitals:   05/06/15 1401  BP: 132/82  Pulse: 112  Temp: 98.7 F (37.1 C)  TempSrc: Oral  Resp: 18  Height: 5' 7" (1.702 m)  Weight: 323 lb 3.2 oz (146.603 kg)  SpO2: 98%    Body mass index is 50.61 kg/(m^2).  Physical Exam  Constitutional: Patient appears well-developed and well-nourished. No distress. Obesity  Eyes:  No scleral icterus. PERL Neck: Normal range of motion. Neck supple. Cardiovascular: Normal rate, regular rhythm and normal heart sounds.  No murmur heard. No BLE edema. Pulmonary/Chest: Effort normal and breath sounds normal. No respiratory distress. Abdominal: Soft.  There is  Tenderness during palpation of LLQ, no guarding or rebound. Psychiatric: Patient has a normal mood and affect. behavior is normal. Judgment and thought content normal. Skin: small , round areas on abdomen, ulcerated from Bydureon injection  Recent Results (from the past 2160 hour(s))  CBC with Differential     Status: Abnormal   Collection Time: 04/16/15 12:28 AM  Result Value Ref Range   WBC 15.0 (H) 4.0 - 10.5 K/uL   RBC 4.51 3.87 - 5.11 MIL/uL   Hemoglobin 11.9 (L) 12.0 - 15.0 g/dL   HCT 35.9 (L) 36.0 - 46.0 %   MCV 79.6 78.0 - 100.0 fL   MCH 26.4 26.0 - 34.0 pg   MCHC 33.1 30.0 - 36.0 g/dL   RDW 13.6 11.5 - 15.5 %   Platelets 291 150 - 400 K/uL   Neutrophils Relative % 68 43 - 77 %   Neutro Abs 10.1 (H) 1.7 - 7.7 K/uL   Lymphocytes Relative 27 12 - 46 %   Lymphs Abs 4.1 (H) 0.7 - 4.0 K/uL   Monocytes Relative 5 3 - 12 %    Monocytes Absolute 0.8 0.1 - 1.0 K/uL   Eosinophils Relative 0 0 - 5 %   Eosinophils Absolute 0.0 0.0 - 0.7 K/uL   Basophils Relative 0 0 - 1 %   Basophils Absolute 0.0 0.0 - 0.1 K/uL  Basic metabolic panel     Status: Abnormal   Collection Time: 04/16/15 12:28 AM  Result Value Ref Range   Sodium 135 135 - 145 mmol/L   Potassium 3.6 3.5 - 5.1 mmol/L   Chloride 105 101 - 111 mmol/L  CO2 21 (L) 22 - 32 mmol/L   Glucose, Bld 288 (H) 65 - 99 mg/dL   BUN 10 6 - 20 mg/dL   Creatinine, Ser 0.63 0.44 - 1.00 mg/dL   Calcium 8.8 (L) 8.9 - 10.3 mg/dL   GFR calc non Af Amer >60 >60 mL/min   GFR calc Af Amer >60 >60 mL/min    Comment: (NOTE) The eGFR has been calculated using the CKD EPI equation. This calculation has not been validated in all clinical situations. eGFR's persistently <60 mL/min signify possible Chronic Kidney Disease.    Anion gap 9 5 - 15  Troponin I     Status: None   Collection Time: 04/16/15 12:28 AM  Result Value Ref Range   Troponin I <0.03 <0.031 ng/mL    Comment:        NO INDICATION OF MYOCARDIAL INJURY.   D-dimer, quantitative (not at [facility])     Status: None   Collection Time: 04/16/15 12:28 AM  Result Value Ref Range   D-Dimer, Quant 0.39 0.00 - 0.48 ug/mL-FEU    Comment:        AT THE INHOUSE ESTABLISHED CUTOFF VALUE OF 0.48 ug/mL FEU, THIS ASSAY HAS BEEN DOCUMENTED IN THE LITERATURE TO HAVE A SENSITIVITY AND NEGATIVE PREDICTIVE VALUE OF AT LEAST 98 TO 99%.  THE TEST RESULT SHOULD BE CORRELATED WITH AN ASSESSMENT OF THE CLINICAL PROBABILITY OF DVT / VTE.      Diabetic Foot Exam - Simple   Simple Foot Form  Visual Inspection  No deformities, no ulcerations, no other skin breakdown bilaterally:  Yes  Sensation Testing  Intact to touch and monofilament testing bilaterally:  Yes  Pulse Check  Posterior Tibialis and Dorsalis pulse intact bilaterally:  Yes  Comments      PHQ2/9: Depression screen PHQ 2/9 05/06/2015  Decreased Interest 1   Down, Depressed, Hopeless 1  PHQ - 2 Score 2  Altered sleeping 3  Tired, decreased energy 3  Change in appetite 3  Feeling bad or failure about yourself  0  Moving slowly or fidgety/restless 1  Suicidal thoughts 0  PHQ-9 Score 12     Fall Risk: Fall Risk  05/06/2015  Falls in the past year? Yes  Number falls in past yr: 1  Injury with Fall? No   Assessment & Plan  1. Type 2 diabetes mellitus with hyperglycemia Change from Bydureon to Victoza because of the local reaction to bydureon - metFORMIN (GLUCOPHAGE-XR) 500 MG 24 hr tablet; Take 1 tablet (500 mg total) by mouth daily with breakfast.  Dispense: 90 tablet; Refill: 0 - Liraglutide (VICTOZA) 18 MG/3ML SOPN; Inject 0.3 mLs (1.8 mg total) into the skin daily.  Dispense: 6 mL; Refill: 2  2. Pelvic pain in female Check pelvic US and send back to [facility] if abnormal  - US Pelvis Complete; Future  3. Leukocytosis  - Ambulatory referral to Hematology  4. Hypocalcemia Recheck level - Calcium  5. Depression with anxiety We will try Cymbalta 58m start at one daily and increase to two daily after first week  and follow up in 6 weeks

## 2015-05-07 LAB — CALCIUM: Calcium: 9.5 mg/dL (ref 8.7–10.2)

## 2015-05-10 ENCOUNTER — Telehealth: Payer: Self-pay | Admitting: Family Medicine

## 2015-05-10 ENCOUNTER — Other Ambulatory Visit: Payer: Self-pay

## 2015-05-10 DIAGNOSIS — D259 Leiomyoma of uterus, unspecified: Secondary | ICD-10-CM

## 2015-05-10 DIAGNOSIS — N83201 Unspecified ovarian cyst, right side: Secondary | ICD-10-CM

## 2015-05-10 DIAGNOSIS — R102 Pelvic and perineal pain: Secondary | ICD-10-CM

## 2015-05-10 NOTE — Telephone Encounter (Signed)
Need order Ultra Sound transvaginal non ob

## 2015-05-17 ENCOUNTER — Other Ambulatory Visit: Payer: Self-pay | Admitting: Family Medicine

## 2015-05-17 ENCOUNTER — Ambulatory Visit
Admission: RE | Admit: 2015-05-17 | Discharge: 2015-05-17 | Disposition: A | Discharge status: Home / Self Care | Payer: BLUE CROSS/BLUE SHIELD | Source: Ambulatory Visit | Attending: Family Medicine | Admitting: Family Medicine

## 2015-05-17 DIAGNOSIS — N888 Other specified noninflammatory disorders of cervix uteri: Secondary | ICD-10-CM | POA: Diagnosis not present

## 2015-05-17 DIAGNOSIS — N83201 Unspecified ovarian cyst, right side: Secondary | ICD-10-CM

## 2015-05-17 DIAGNOSIS — R102 Pelvic and perineal pain: Secondary | ICD-10-CM | POA: Diagnosis present

## 2015-05-18 NOTE — Telephone Encounter (Signed)
Patient has not seen a GYN in 2 years and would like to be referred to some one linked with Dr. Ancil Boozer.

## 2015-05-18 NOTE — Telephone Encounter (Signed)
-----   Message from Steele Sizer, MD sent at 05/17/2015  5:57 PM EDT ----- She has uterine fibroid and also a cyst on right ovary, since she has pelvic pain , recommend follow up with her GYN for pelvic US follow up in about 6 weeks Please notify patient, thank you

## 2015-05-23 ENCOUNTER — Inpatient Hospital Stay: Payer: BLUE CROSS/BLUE SHIELD | Attending: Internal Medicine | Admitting: Internal Medicine

## 2015-06-09 ENCOUNTER — Ambulatory Visit (INDEPENDENT_AMBULATORY_CARE_PROVIDER_SITE_OTHER): Payer: BLUE CROSS/BLUE SHIELD | Admitting: Obstetrics and Gynecology

## 2015-06-09 ENCOUNTER — Encounter: Payer: Self-pay | Admitting: Obstetrics and Gynecology

## 2015-06-09 ENCOUNTER — Inpatient Hospital Stay: Payer: BLUE CROSS/BLUE SHIELD | Attending: Oncology | Admitting: Oncology

## 2015-06-09 DIAGNOSIS — N939 Abnormal uterine and vaginal bleeding, unspecified: Secondary | ICD-10-CM

## 2015-06-09 DIAGNOSIS — Z124 Encounter for screening for malignant neoplasm of cervix: Secondary | ICD-10-CM

## 2015-06-12 NOTE — Progress Notes (Signed)
Subjective:     Erin Good is a 39 y.o. G2P2 female who presents as a referral from [facility] for irregular menses and findings of right ovarian cyst (ultrasound performed for complaints of pelvic pain).  LMP 05/13/2015.  Menarche age: 86. Periods were previously regular, however over the past several months arebecomming heavier, with passage of clots.  Cycles are lasting 4-6 days. Dysmenorrhea:moderate, occurring first 1-2 days of flow. Cyclic symptoms include: none.  Has tried OCPs in the past, but noted that it lowered seizure threshold. Current contraception: tubal ligation.  History of abnormal Pap smear: yes - remote history of 1 abnormal pap many years ago.  Last pap normal, ~ 3 years ago.  Past Medical History  Diagnosis Date  . Epilepsy   . Seizures   . Galactorrhea   . Epilepsy   . Obesity   . Tachycardia   . Allergic rhinitis   . Hx gestational diabetes   . Dyslipidemia   . Sleep apnea   . Diabetes mellitus without complication   . Kidney stones   . Syncope and collapse     Past Surgical History  Procedure Laterality Date  . Cesarean section      X 2  . Tubal ligation  2007  . Right oophorectomy Right 2001    benign tumor    Current Outpatient Prescriptions on File Prior to Visit  Medication Sig Dispense Refill  . clonazePAM (KLONOPIN) 0.5 MG tablet Take 1 tablet by mouth at bedtime as needed.    . DULoxetine (CYMBALTA) 30 MG capsule Take 1 capsule (30 mg total) by mouth daily. 60 capsule 0  . EPINEPHrine (EPIPEN 2-PAK) 0.3 mg/0.3 mL IJ SOAJ injection Inject 1 pen as directed as needed.    Marland Kitchen glucose blood (ONETOUCH VERIO) test strip 1 strip by Does not apply route daily.    Marland Kitchen ibuprofen (ADVIL,MOTRIN) 600 MG tablet Take 1 tablet (600 mg total) by mouth every 6 (six) hours as needed. 30 tablet 0  . Liraglutide (VICTOZA) 18 MG/3ML SOPN Inject 0.3 mLs (1.8 mg total) into the skin daily. 6 mL 2  . metFORMIN (GLUCOPHAGE-XR) 500 MG 24 hr tablet Take  1 tablet (500 mg total) by mouth daily with breakfast. 90 tablet 0  . zonisamide (ZONEGRAN) 100 MG capsule Take 400 mg by mouth at bedtime.      Allergies  Allergen Reactions  . Aspirin Other (See Comments)    Does take because of her epilepsy/seizure   . Peanuts  [Peanut Oil]      Review of Systems Pertinent items are noted in HPI.     Objective:    BP 117/76 mmHg  Pulse 108  Ht 5' 7"  (1.702 m)  Wt 321 lb (145.605 kg)  BMI 50.26 kg/m2   General appearance: alert and no distress, obese Abdomen: soft, non-tender; bowel sounds normal; no masses, no organomegaly Pelvic: external genitalia normal, rectovaginal septum normal and vagina normal without discharge. Cervix without lesions or CMT, nulliparous. Uterus mobile, notender, difficult to assess size due to body habitus. Adnexae nontender, nonpalpable.  Extremities: extremities normal, atraumatic, no cyanosis or edema Neurologic: Grossly normal General appearance: alert and no distress, obese     Labs:  Lab Results  Component Value Date   WBC 15.0* 04/16/2015   HGB 11.9* 04/16/2015   HCT 35.9* 04/16/2015   MCV 79.6 04/16/2015   PLT 291 04/16/2015     Imaging:  Pelvic Ultrasound 05/15/2015: FINDINGS: Uterus: Measurements: 9.8 x 5.3 x  5.9 cm. In the posterior aspect of the uterine corpus there is a slightly hypoechoic focus which measures 2.3 x 1.9 x 1.9 cm. There are nabothian cysts.  Endometrium: Thickness: 12.3 mm. No focal abnormality visualized.  Right ovary: Measurements: 2.2 x 3.8 x 4.3 cm. Within the right ovary there is an area of decreased echogenicity measuring 1.9 x 2 x 2 cm. A small focus of decreased echogenicity within it is observed. This may reflect a hemorrhagic cyst. There is peripheral but no significant internal vascularity.  Left ovary: The left ovary is surgically absent. There is no left adnexal mass.  Other findings: No free fluid.  IMPRESSION: 1. Probable posterior fibroid in the  uterine corpus measuring up to 2.3 cm. The endometrial stripe measures 12.3 mm which is not unreasonable given the imminent menstrual period. 2. Hypoechoic focus in the right ovary with some increased vascularity likely reflects a hemorrhagic cyst but it is a nonspecific finding. Follow-up ultrasound in 6-12 weeks is recommended.  Assessment:    The patient has menorrhagia.   Obesity Seizure disorder Fibroid uterus   Plan:   Diagnosis explained in detail. All questions answered. Neurosurgeon distributed. Blood tests: TSH next visit. Pap smear.  Endometrial biopsy. See separate procedure note. Pelvic ultrasound.   Discussed that metabolism of OCPs can interfere with metabolism of other drugs (including seizure medicatons). Would recommend a treatment that does not require oral digestion (i.e. Mirena IUD, Nuva Ring) or surgical management with endometrial ablation (Novasure/Hydrothermal Ablation) or hysterectomy . Would not recommend Nexplanon or Depo Provera due to patient's obesity and being less effective. Patient considering IUD. Handout given. RTC in 2 weeks for likely IUD insertion. Discussed obesity as a risk factor for abnormal uterine bleeding, endometrial hyperplasia.    Endometrial Biopsy Procedure Note  Pre-operative Diagnosis: menorrhagia  Post-operative Diagnosis: same  Indications: abnormal uterine bleeding  Procedure Details   Urine pregnancy test was not done.  The risks (including infection, bleeding, pain, and uterine perforation) and benefits of the procedure were explained to the patient and Verbal informed consent was obtained.    The patient was placed in the dorsal lithotomy position.  Bimanual exam showed the uterus to be in the anteroflexed position.  A Graves' speculum inserted in the vagina, and the cervix prepped with povidone iodine.    A sharp tenaculum was applied to the anterior lip of the cervix for stabilization.  A sterile uterine  sound was used to sound the uterus to a depth of 10 cm.  A Pipelle endometrial aspirator was used to sample the endometrium.  Sample was sent for pathologic examination.  Condition: Stable  Complications: None  Plan:  The patient was advised to call for any fever or for prolonged or severe pain or bleeding. She was advised to use OTC ibuprofen as needed for mild to moderate pain. She was advised to avoid vaginal intercourse for 48 hours or until the bleeding has completely stopped.   [name], MD [facility]

## 2015-06-13 LAB — PAP IG AND HPV HIGH-RISK: PAP Smear Comment: 0

## 2015-06-13 LAB — PATHOLOGY

## 2015-06-15 ENCOUNTER — Other Ambulatory Visit: Payer: Self-pay | Admitting: Family Medicine

## 2015-06-24 ENCOUNTER — Telehealth: Payer: Self-pay | Admitting: Obstetrics and Gynecology

## 2015-06-24 NOTE — Telephone Encounter (Signed)
PT WAS HERE A COUPLE OF WEEKS AGO AND SHE HAD A PAP OR BX DONE AND SHE WANTED TO KNOW THE RESULTS, DR CHERRY WANTED HER TO COME IN 2 WEEK SLATER FOR IUD BUT PT LEFT AND DID NOT CHECK OUT SO I MADE THE APPT FOR SEPT FOR HER TO COME IN FOR IUD BUT SHE WANTED TO SEE IF SHE COULD GO AHEAD AND GET THE RESULTS OF HER TEST/

## 2015-06-27 ENCOUNTER — Encounter: Payer: Self-pay | Admitting: Family Medicine

## 2015-06-27 ENCOUNTER — Ambulatory Visit (INDEPENDENT_AMBULATORY_CARE_PROVIDER_SITE_OTHER): Payer: BLUE CROSS/BLUE SHIELD | Admitting: Family Medicine

## 2015-06-27 VITALS — BP 132/84 | HR 116 | Temp 98.4°F | Resp 18 | Ht 67.0 in | Wt 321.6 lb

## 2015-06-27 DIAGNOSIS — E785 Hyperlipidemia, unspecified: Secondary | ICD-10-CM

## 2015-06-27 DIAGNOSIS — E1121 Type 2 diabetes mellitus with diabetic nephropathy: Secondary | ICD-10-CM

## 2015-06-27 DIAGNOSIS — F329 Major depressive disorder, single episode, unspecified: Secondary | ICD-10-CM

## 2015-06-27 DIAGNOSIS — Z23 Encounter for immunization: Secondary | ICD-10-CM | POA: Diagnosis not present

## 2015-06-27 DIAGNOSIS — F32A Depression, unspecified: Secondary | ICD-10-CM

## 2015-06-27 DIAGNOSIS — Z79899 Other long term (current) drug therapy: Secondary | ICD-10-CM | POA: Diagnosis not present

## 2015-06-27 DIAGNOSIS — F419 Anxiety disorder, unspecified: Secondary | ICD-10-CM

## 2015-06-27 DIAGNOSIS — F418 Other specified anxiety disorders: Secondary | ICD-10-CM | POA: Diagnosis not present

## 2015-06-27 MED ORDER — DULOXETINE HCL 60 MG PO CPEP: 60.0000 mg | ORAL_CAPSULE | Freq: Every day | ORAL | Status: DC

## 2015-06-27 MED ORDER — LISINOPRIL 10 MG PO TABS: 10.0000 mg | ORAL_TABLET | Freq: Every day | ORAL | Status: DC

## 2015-06-27 NOTE — Telephone Encounter (Signed)
I informed pt that a follow up appt should have been made at last appt but she had left without doing so. Dr. Marcelline Mates would need to give me the okay to give these to her. Aware that I would send her a message and return her call as soon as I hear from her.

## 2015-06-27 NOTE — Telephone Encounter (Signed)
Pt aware of results 

## 2015-06-27 NOTE — Telephone Encounter (Signed)
Her biopsy and pap smear were benign.  You can inform patient of results.

## 2015-06-27 NOTE — Progress Notes (Signed)
Name: Erin Good   MRN: [id number]    DOB: 1976-06-19   Date:06/27/2015       Progress Note  Subjective  Chief Complaint  Chief Complaint  Patient presents with  . Medication Management    5 week F/U  . Depression    Started Cymbalta on Last visit, medication is controlling symptoms. Patient feels better but today she is having a panic attack started around 2:15 p.m. having work stress, left arm tingling and racing heartbeats.  . Diabetes    Changed from Bydureon to Victoza on last visit, Checks twice daily, Low-104 Average-121 High-170    HPI  DMII: glucose has been at goal since switched from Bydureon to [place], she has been more compliant with medication, denies side effects of medication, still taking Metformin, she is due for an eye exam, urine micro was 50 and discussed starting medication for it. Discussed possible side effects including angioedema.   Depression/Anxiety: still under a lot of stress at work, has to drive from [place] to [place].  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 She is taking Cymbalta and is not noticing improvement of symptoms.   Seizure disorder: still has episodes of seizures, has to drive over one hour to go to work, discussed FMLA and getting authorization to work from home, should not be driving at this time. Last seizure was 2 days ago. She has grand mal and some petit mal. She is able to recognize and takes clonazepam and lays down when she has a warning.   Dyslipidemia: not currently on medication, we will recheck all her labs on her next visit.    Patient Active Problem List   Diagnosis Date Noted  . Depression with anxiety 05/06/2015  . Pelvic pain in female 05/06/2015  . Allergic rhinitis 04/20/2015  . Anxiety and depression 04/20/2015  . Grand mal  seizure disorder 04/20/2015  . Gastro-esophageal reflux disease without esophagitis 04/20/2015  . Dysmetabolic syndrome 97/41/6384  . Extreme obesity 04/20/2015  . NASH (nonalcoholic steatohepatitis) 04/20/2015  . Allergy to nuts 04/20/2015  . Calculus of kidney 04/20/2015  . Type 2 diabetes mellitus with hyperglycemia 04/20/2015  . Central sleep apnea 11/26/2008  . Dyslipidemia 07/02/2008  . Leukocytosis 07/29/2007    Past Surgical History  Procedure Laterality Date  . Cesarean section      X 2  . Tubal ligation  2007  . Right oophorectomy Right 2001    benign tumor    Family History  Problem Relation Age of Onset  . Diabetes Mother     Social History   Social History  . Marital Status: Married    Spouse Name: Roderic Palau  . Number of Children: 2  . Years of Education: College   Occupational History  . [employer]   Social History Main Topics  . Smoking status: Never Smoker   . Smokeless tobacco: Not on file  . Alcohol Use: No  . Drug Use: No  . Sexual Activity: Not on file   Other Topics Concern  . Not on file   Social History Narrative     Current outpatient prescriptions:  .  clonazePAM (KLONOPIN) 0.5 MG tablet, Take 1 tablet by mouth at bedtime as needed., Disp: , Rfl:  .  EPINEPHrine (EPIPEN 2-PAK) 0.3 mg/0.3 mL  IJ SOAJ injection, Inject 1 pen as directed as needed., Disp: , Rfl:  .  glucose blood (ONETOUCH VERIO) test strip, 1 strip by Does not apply route daily., Disp: , Rfl:  .  ibuprofen (ADVIL,MOTRIN) 600 MG tablet, Take 1 tablet (600 mg total) by mouth every 6 (six) hours as needed., Disp: 30 tablet, Rfl: 0 .  Liraglutide (VICTOZA) 18 MG/3ML SOPN, Inject 0.3 mLs (1.8 mg total) into the skin daily., Disp: 6 mL, Rfl: 2 .  metFORMIN (GLUCOPHAGE-XR) 500 MG 24 hr tablet, TAKE 1 TABLET (500 MG TOTAL) BY MOUTH DAILY WITH BREAKFAST., Disp: 90 tablet, Rfl: 0 .  zonisamide (ZONEGRAN) 100 MG capsule, Take 400 mg by mouth at bedtime., Disp: ,  Rfl:   Allergies  Allergen Reactions  . Aspirin Other (See Comments)    Does take because of her epilepsy/seizure   . Peanuts  [Peanut Oil]      ROS  Constitutional: Negative for fever or significant weight change.  Respiratory: Negative for cough and shortness of breath.   Cardiovascular: Negative for chest pain or palpitations.  Gastrointestinal: Negative for abdominal pain, no bowel changes.  Musculoskeletal: Negative for gait problem or joint swelling.  Skin: Negative for rash.  Neurological: Negative for dizziness or headache.  No other specific complaints in a complete review of systems (except as listed in HPI above).  Objective  Filed Vitals:   06/27/15 1625  BP: 146/90  Pulse: 116  Temp: 98.4 F (36.9 C)  TempSrc: Oral  Resp: 18  Height: 5' 7"  (1.702 m)  Weight: 321 lb 9.6 oz (145.877 kg)  SpO2: 98%    Body mass index is 50.36 kg/(m^2).  Physical Exam  Constitutional: Patient appears well-developed and well-nourished. Obese  No distress.  HEENT: head atraumatic, normocephalic, pupils equal and reactive to light, neck supple, throat within normal limits Cardiovascular: Normal rate, regular rhythm and normal heart sounds.  No murmur heard. No BLE edema. Pulmonary/Chest: Effort normal and breath sounds normal. No respiratory distress. Abdominal: Soft.  There is no tenderness. Psychiatric: Patient has a normal mood and affect. behavior is normal. Judgment and thought content normal.     PHQ2/9: Depression screen PHQ 2/9 05/06/2015  Decreased Interest 1  Down, Depressed, Hopeless 1  PHQ - 2 Score 2  Altered sleeping 3  Tired, decreased energy 3  Change in appetite 3  Feeling bad or failure about yourself  0  Moving slowly or fidgety/restless 1  Suicidal thoughts 0  PHQ-9 Score 12     Fall Risk: Fall Risk  05/06/2015  Falls in the past year? Yes  Number falls in past yr: 1  Injury with Fall? No      Assessment & Plan  1. Type 2 diabetes  mellitus with diabetic nephropathy Start ace, more compliant with medication over the past 5 weeks, we will not adjust doses, glucose seems to be controlled now, recheck in 3 months - lisinopril (PRINIVIL,ZESTRIL) 10 MG tablet; Take 1 tablet (10 mg total) by mouth daily.  Dispense: 90 tablet; Refill: 3 - Hemoglobin A1c; Future  2. Anxiety and depression Increase dose to 38m daily and recheck in 3 months, she does not want to come back in 6 week.  - DULoxetine (CYMBALTA) 60 MG capsule; Take 1 capsule (60 mg total) by mouth daily.  Dispense: 30 capsule; Refill: 1  3. Dyslipidemia  - Lipid panel; Future  4. Needs flu shot  - Flu Vaccine QUAD 36+ mos IM  5. Long-term use of high-risk  medication  - Comprehensive metabolic panel; Future - CBC with Differential/Platelet; Future - Vitamin B12; Future

## 2015-07-07 ENCOUNTER — Inpatient Hospital Stay: Payer: BLUE CROSS/BLUE SHIELD | Attending: Oncology | Admitting: Oncology

## 2015-07-20 ENCOUNTER — Encounter: Payer: Self-pay | Admitting: Family Medicine

## 2015-07-20 ENCOUNTER — Ambulatory Visit (INDEPENDENT_AMBULATORY_CARE_PROVIDER_SITE_OTHER): Payer: BLUE CROSS/BLUE SHIELD | Admitting: Family Medicine

## 2015-07-20 VITALS — BP 118/82 | HR 121 | Temp 99.3°F | Resp 20 | Ht 67.0 in | Wt 319.5 lb

## 2015-07-20 DIAGNOSIS — R053 Chronic cough: Secondary | ICD-10-CM

## 2015-07-20 DIAGNOSIS — R05 Cough: Secondary | ICD-10-CM | POA: Diagnosis not present

## 2015-07-20 DIAGNOSIS — J0100 Acute maxillary sinusitis, unspecified: Secondary | ICD-10-CM

## 2015-07-20 MED ORDER — AMOXICILLIN-POT CLAVULANATE 875-125 MG PO TABS: 1.0000 | ORAL_TABLET | Freq: Two times a day (BID) | ORAL | Status: DC

## 2015-07-20 MED ORDER — HYDROCOD POLST-CPM POLST ER 10-8 MG/5ML PO SUER
5.0000 mL | Freq: Two times a day (BID) | ORAL | Status: DC | PRN
Start: 2015-07-20 — End: 2015-12-21

## 2015-07-20 NOTE — Patient Instructions (Signed)

## 2015-07-20 NOTE — Progress Notes (Signed)
Name: Erin Good   MRN: [id number]    DOB: [birth year]   Date:07/20/2015       Progress Note  Subjective  Chief Complaint  Chief Complaint  Patient presents with  . Sinus Problem    (Dr. Ancil Boozer Pt) Possible sinus infection    HPI   Patient is here today with concerns regarding the following symptoms cough that is productive of clear sputum, paroxysmal, worsening over time with sore throat, congestion, post nasal drip, ear pressure, sinus pressure, achiness and low grade fevers that started days ago.  Associated with fatigue. Not associated with rash, foreign travel, contact with other individuals with similar symptoms, nausea, vomiting, change in bowel movements. Has tried the following home remedies: Dayquil and Nyquil, which have not alleviated symptoms.    Past Medical History  Diagnosis Date  . Epilepsy   . Seizures   . Galactorrhea   . Epilepsy   . Obesity   . Tachycardia   . Allergic rhinitis   . Hx gestational diabetes   . Dyslipidemia   . Sleep apnea   . Diabetes mellitus without complication   . Kidney stones   . Syncope and collapse     Social History  Substance Use Topics  . Smoking status: Never Smoker   . Smokeless tobacco: Not on file  . Alcohol Use: No     Current outpatient prescriptions:  .  clonazePAM (KLONOPIN) 0.5 MG tablet, Take 1 tablet by mouth at bedtime as needed., Disp: , Rfl:  .  DULoxetine (CYMBALTA) 60 MG capsule, Take 1 capsule (60 mg total) by mouth daily., Disp: 30 capsule, Rfl: 1 .  EPINEPHrine (EPIPEN 2-PAK) 0.3 mg/0.3 mL IJ SOAJ injection, Inject 1 pen as directed as needed., Disp: , Rfl:  .  glucose blood (ONETOUCH VERIO) test strip, 1 strip by Does not apply route daily., Disp: , Rfl:  .  ibuprofen (ADVIL,MOTRIN) 600 MG tablet, Take 1 tablet (600 mg total) by mouth every 6 (six) hours as needed., Disp: 30 tablet, Rfl: 0 .  Liraglutide (VICTOZA) 18 MG/3ML SOPN, Inject 0.3 mLs (1.8 mg total) into the skin daily., Disp: 6  mL, Rfl: 2 .  lisinopril (PRINIVIL,ZESTRIL) 10 MG tablet, Take 1 tablet (10 mg total) by mouth daily., Disp: 90 tablet, Rfl: 3 .  metFORMIN (GLUCOPHAGE-XR) 500 MG 24 hr tablet, TAKE 1 TABLET (500 MG TOTAL) BY MOUTH DAILY WITH BREAKFAST., Disp: 90 tablet, Rfl: 0 .  zonisamide (ZONEGRAN) 100 MG capsule, Take 400 mg by mouth at bedtime., Disp: , Rfl:  .  amoxicillin-clavulanate (AUGMENTIN) 875-125 MG per tablet, Take 1 tablet by mouth 2 (two) times daily., Disp: 20 tablet, Rfl: 0 .  chlorpheniramine-HYDROcodone (TUSSIONEX PENNKINETIC ER) 10-8 MG/5ML SUER, Take 5 mLs by mouth every 12 (twelve) hours as needed for cough., Disp: 100 mL, Rfl: 0  Allergies  Allergen Reactions  . Aspirin Other (See Comments)    Does take because of her epilepsy/seizure   . Peanuts  [Peanut Oil]     ROS  CONSTITUTIONAL: No significant weight changes. Yes low grade fever, chills, weakness or fatigue.  HEENT:  - Eyes: No visual changes.  - Ears: No auditory changes. Yes ear fullness. - Nose: Nyes sneezing, congestion, runny nose. - Throat: Yes sore throat. No changes in swallowing. SKIN: No rash or itching.  CARDIOVASCULAR: No chest pain, chest pressure or chest discomfort. No palpitations or edema.  RESPIRATORY: No shortness of breath. Yes cough with sputum.  GASTROINTESTINAL: No anorexia, nausea, vomiting. No  changes in bowel habits. No abdominal pain or blood.  GENITOURINARY: No dysuria. No frequency. No discharge.  NEUROLOGICAL: No headache, dizziness, syncope, paralysis, ataxia, numbness or tingling in the extremities. No memory changes. No change in bowel or bladder control.  MUSCULOSKELETAL: No joint pain. No muscle pain. HEMATOLOGIC: No anemia, bleeding or bruising.  LYMPHATICS: No enlarged lymph nodes.  PSYCHIATRIC: No change in mood. No change in sleep pattern.  ENDOCRINOLOGIC: No reports of sweating, cold or heat intolerance. No polyuria or polydipsia.      Objective  Filed Vitals:   07/20/15  1424  BP: 118/82  Pulse: 121  Temp: 99.3 F (37.4 C)  TempSrc: Oral  Resp: 20  Height: 5' 7"  (1.702 m)  Weight: 319 lb 8 oz (144.924 kg)  SpO2: 96%   Body mass index is 50.03 kg/(m^2).   Physical Exam  Constitutional: Patient appears well-developed and well-nourished. In no acute distress but does appear to be fatigued from acute illness. HEENT:  - Head: Normocephalic and atraumatic.  - Ears: RIGHT TM bulging with minimal clear exudate, LEFT TM bulging with minimal clear exudate.  - Nose: Nasal mucosa boggy and congested.  - Mouth/Throat: Oropharynx is moist with slight erythema of bilateral tonsils without hypertrophy or exudates. Post nasal drainage present.  - Eyes: Conjunctivae clear, EOM movements normal. PERRLA. No scleral icterus.  Neck: Normal range of motion. Neck supple. No JVD present. No thyromegaly present. No local lymphadenopathy. Cardiovascular: Regular rate, regular rhythm with no murmurs heard.  Pulmonary/Chest: Effort normal and breath sounds with no wheezing rales or rhonci.  Musculoskeletal: Normal range of motion bilateral UE and LE, no joint effusions. Skin: Skin is warm and dry. No rash noted. Psychiatric: Patient has a stable mood and affect. Behavior is normal in office today. Judgment and thought content normal in office today.   Assessment & Plan  1. Acute maxillary sinusitis, recurrence not specified Possible etiologies of current symptoms may be bacterial sinusitis or seasonal influenza however it is early in the season so I am not compelled to say this is the flu.  Will treat as sinusitis. Instructed patient on increasing hydration, nasal saline spray use prn, steam inhalation prn, cough medicine prn (either Delsym OTC or prescription if provided), NSAID prn if tolerated and not contraindicated. Antibiotic therapy initiated today. If symptoms persist or worsen will get chest X-ray as the next step in care.   - amoxicillin-clavulanate (AUGMENTIN)  875-125 MG per tablet; Take 1 tablet by mouth 2 (two) times daily.  Dispense: 20 tablet; Refill: 0  2. Cough, persistent  - chlorpheniramine-HYDROcodone (TUSSIONEX PENNKINETIC ER) 10-8 MG/5ML SUER; Take 5 mLs by mouth every 12 (twelve) hours as needed for cough.  Dispense: 100 mL; Refill: 0

## 2015-07-28 ENCOUNTER — Ambulatory Visit (INDEPENDENT_AMBULATORY_CARE_PROVIDER_SITE_OTHER): Payer: BLUE CROSS/BLUE SHIELD | Admitting: Obstetrics and Gynecology

## 2015-07-28 ENCOUNTER — Encounter: Payer: Self-pay | Admitting: Obstetrics and Gynecology

## 2015-07-28 VITALS — BP 120/78 | HR 88 | Ht 66.0 in | Wt 317.9 lb

## 2015-07-28 DIAGNOSIS — N939 Abnormal uterine and vaginal bleeding, unspecified: Secondary | ICD-10-CM

## 2015-07-28 DIAGNOSIS — Z30430 Encounter for insertion of intrauterine contraceptive device: Secondary | ICD-10-CM

## 2015-07-28 DIAGNOSIS — E669 Obesity, unspecified: Secondary | ICD-10-CM

## 2015-07-28 LAB — POCT URINE PREGNANCY: PREG TEST UR: NEGATIVE

## 2015-07-28 NOTE — Patient Instructions (Signed)
IUD PLACEMENT POST-PROCEDURE INSTRUCTIONS  1. You may take Ibuprofen, Aleve or Tylenol for pain if needed.  Cramping should resolve within in 24 hours.  2. You may have a small amount of spotting.  You should wear a mini pad for the next few days.  3. You may have intercourse after 24 hours.  If you using this for birth control, it is effective immediately.  4. You need to call if you have any pelvic pain, fever, heavy bleeding or foul smelling vaginal discharge.  Irregular bleeding is common the first several months after having an IUD placed. You do not need to call for this reason unless you are concerned.  5. Shower or bathe as normal  6. You should have a follow-up appointment in 4 weeks for a re-check to make sure you are not having any problems.

## 2015-07-28 NOTE — Progress Notes (Signed)
    GYNECOLOGY CLINIC PROCEDURE NOTE  Erin Good is a 39 y.o. G2P2 here for Mirena IUD insertion. No GYN concerns.  Last pap smear was on 06/2015 and was normal.  IUD Insertion Procedure Note Patient identified, informed consent performed, consent signed.   Discussed risks of irregular bleeding, cramping, infection, malpositioning or misplacement of the IUD outside the uterus which may require further procedure such as laparoscopy. Time out was performed.  Urine pregnancy test negative.  Speculum placed in the vagina.  Cervix visualized.  Cleaned with Betadine x 2.  Grasped anteriorly with a single tooth tenaculum.  The cervix was noted to be nulliparous, and so was dilated using cervical dilators.  Uterus was then sounded to 10 cm.  Attempts at placing the Mirena IUD were unsuccessful due to tightened internal cervical os.  The decision was made to proceed with the [facility] IUD due to slightly smaller dimensions of insertion device.  The Skyla IUD placed per manufacturer's recommendations.  Strings trimmed to 3 cm. Tenaculum was removed, good hemostasis noted.  Patient tolerated procedure well.   Patient was given post-procedure instructions.  She was advised to have backup contraception for one week.  Patient was also asked to check IUD strings periodically and follow up in 4 weeks for IUD check.   [name], MD [facility] 07/29/2015 1:53 PM

## 2015-07-29 LAB — TSH: TSH: 1.07 u[IU]/mL (ref 0.450–4.500)

## 2015-07-29 MED ORDER — LEVONORGESTREL 13.5 MG IU IUD: 1.0000 | INTRAUTERINE_SYSTEM | Freq: Once | INTRAUTERINE | Status: DC

## 2015-08-25 ENCOUNTER — Ambulatory Visit: Payer: BLUE CROSS/BLUE SHIELD | Admitting: Obstetrics and Gynecology

## 2015-11-23 ENCOUNTER — Emergency Department
Admission: EM | Admit: 2015-11-23 | Discharge: 2015-11-24 | Disposition: A | Discharge status: Home / Self Care | Payer: BLUE CROSS/BLUE SHIELD | Attending: Emergency Medicine | Admitting: Emergency Medicine

## 2015-11-23 ENCOUNTER — Emergency Department: Payer: BLUE CROSS/BLUE SHIELD

## 2015-11-23 ENCOUNTER — Encounter: Payer: Self-pay | Admitting: *Deleted

## 2015-11-23 ENCOUNTER — Other Ambulatory Visit: Payer: Self-pay

## 2015-11-23 DIAGNOSIS — Z792 Long term (current) use of antibiotics: Secondary | ICD-10-CM | POA: Insufficient documentation

## 2015-11-23 DIAGNOSIS — E119 Type 2 diabetes mellitus without complications: Secondary | ICD-10-CM | POA: Insufficient documentation

## 2015-11-23 DIAGNOSIS — R609 Edema, unspecified: Secondary | ICD-10-CM | POA: Insufficient documentation

## 2015-11-23 DIAGNOSIS — N390 Urinary tract infection, site not specified: Secondary | ICD-10-CM | POA: Diagnosis not present

## 2015-11-23 DIAGNOSIS — Z3202 Encounter for pregnancy test, result negative: Secondary | ICD-10-CM | POA: Diagnosis not present

## 2015-11-23 DIAGNOSIS — Z794 Long term (current) use of insulin: Secondary | ICD-10-CM | POA: Insufficient documentation

## 2015-11-23 DIAGNOSIS — R079 Chest pain, unspecified: Secondary | ICD-10-CM | POA: Diagnosis present

## 2015-11-23 DIAGNOSIS — R102 Pelvic and perineal pain: Secondary | ICD-10-CM

## 2015-11-23 DIAGNOSIS — Z79899 Other long term (current) drug therapy: Secondary | ICD-10-CM | POA: Diagnosis not present

## 2015-11-23 DIAGNOSIS — R0789 Other chest pain: Secondary | ICD-10-CM | POA: Diagnosis not present

## 2015-11-23 LAB — CBC
HEMATOCRIT: 41.6 % (ref 35.0–47.0)
Hemoglobin: 13.5 g/dL (ref 12.0–16.0)
MCH: 25.3 pg — AB (ref 26.0–34.0)
MCHC: 32.4 g/dL (ref 32.0–36.0)
MCV: 78 fL — AB (ref 80.0–100.0)
PLATELETS: 289 10*3/uL (ref 150–440)
RBC: 5.34 MIL/uL — AB (ref 3.80–5.20)
RDW: 13.6 % (ref 11.5–14.5)
WBC: 16.3 10*3/uL — AB (ref 3.6–11.0)

## 2015-11-23 LAB — BASIC METABOLIC PANEL
Anion gap: 9 (ref 5–15)
BUN: 9 mg/dL (ref 6–20)
CALCIUM: 9.3 mg/dL (ref 8.9–10.3)
CHLORIDE: 103 mmol/L (ref 101–111)
CO2: 24 mmol/L (ref 22–32)
CREATININE: 0.57 mg/dL (ref 0.44–1.00)
GFR calc non Af Amer: >60 mL/min (ref >60)
GLUCOSE: 209 mg/dL — AB (ref 65–99)
Potassium: 3.6 mmol/L (ref 3.5–5.1)
Sodium: 136 mmol/L (ref 135–145)

## 2015-11-23 LAB — TROPONIN I
Troponin I: 0.04 ng/mL — ABNORMAL HIGH (ref <0.031)
Troponin I: 0.03 ng/mL (ref <0.031)

## 2015-11-23 LAB — FIBRIN DERIVATIVES D-DIMER (ARMC ONLY): Fibrin derivatives D-dimer (ARMC): 357 (ref 0–499)

## 2015-11-23 NOTE — ED Provider Notes (Addendum)
[facility] Emergency Department Provider Note  ____________________________________________  Time seen: 2158  I have reviewed the triage vital signs and the nursing notes.  History by:    HISTORY  Chief Complaint Chest Pain  abdominal pain Leg swelling    HPI Erin Good is a 40 y.o. female who reports she began to have some chest pain 3 days ago. She also reports she has had some lower abdominal pain. She denies any vomiting or diarrhea. She has had some nausea. She also reports that she has some swelling in both legs.  Patient reports the chest pain sometimes is worse when she is rolling over in bed. She does have some tenderness and soreness over the central portion of the chest. She denies any shortness of breath. She denies any prior cardiac history. She does have a history of seizures. She also has a history of fibroids and ovarian cyst. She reports she has had her right ovary removed.    Past Medical History  Diagnosis Date  . Epilepsy ([place])   . Seizures ([place])   . Galactorrhea   . Epilepsy ([place])   . Obesity   . Tachycardia   . Allergic rhinitis   . Hx gestational diabetes   . Dyslipidemia   . Sleep apnea   . Diabetes mellitus without complication ([place])   . Kidney stones   . Syncope and collapse     Patient Active Problem List   Diagnosis Date Noted  . Acute maxillary sinusitis 07/20/2015  . Cough, persistent 07/20/2015  . Depression with anxiety 05/06/2015  . Pelvic pain in female 05/06/2015  . Allergic rhinitis 04/20/2015  . Anxiety and depression 04/20/2015  . 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 Gastro-esophageal reflux disease without esophagitis 04/20/2015  . Dysmetabolic syndrome 11/07/7251  . Extreme obesity ([place]) 04/20/2015  . NASH (nonalcoholic steatohepatitis) 04/20/2015  . Allergy to nuts 04/20/2015  . Calculus of kidney 04/20/2015  . Type 2 diabetes mellitus with renal manifestations ([place])  04/20/2015  . Central sleep apnea 11/26/2008  . Dyslipidemia 07/02/2008  . Leukocytosis 07/29/2007    Past Surgical History  Procedure Laterality Date  . Cesarean section      X 2  . Tubal ligation  2007  . Right oophorectomy Right 2001    benign tumor    Current Outpatient Rx  Name  Route  Sig  Dispense  Refill  . amoxicillin-clavulanate (AUGMENTIN) 875-125 MG per tablet   Oral   Take 1 tablet by mouth 2 (two) times daily.   20 tablet   0   . chlorpheniramine-HYDROcodone (TUSSIONEX PENNKINETIC ER) 10-8 MG/5ML SUER   Oral   Take 5 mLs by mouth every 12 (twelve) hours as needed for cough.   100 mL   0   . clonazePAM (KLONOPIN) 0.5 MG tablet   Oral   Take 1 tablet by mouth at bedtime as needed.         . DULoxetine (CYMBALTA) 60 MG capsule   Oral   Take 1 capsule (60 mg total) by mouth daily.   30 capsule   1   . EPINEPHrine (EPIPEN 2-PAK) 0.3 mg/0.3 mL IJ SOAJ injection   Injection   Inject 1 pen as directed as needed.         Marland Kitchen glucose blood (ONETOUCH VERIO) test strip   Does not apply   1 strip by Does not apply route daily.         Marland Kitchen ibuprofen (ADVIL,MOTRIN)  600 MG tablet   Oral   Take 1 tablet (600 mg total) by mouth every 6 (six) hours as needed.   30 tablet   0   . Levonorgestrel 13.5 MG IUD   Intrauterine   1 Device by Intrauterine route once. Every 3 years   1 Intra Uterine Device   0   . Liraglutide (VICTOZA) 18 MG/3ML SOPN   Subcutaneous   Inject 0.3 mLs (1.8 mg total) into the skin daily.   6 mL   2   . zonisamide (ZONEGRAN) 100 MG capsule   Oral   Take 400 mg by mouth at bedtime.           Allergies Aspirin and Peanuts   Family History  Problem Relation Age of Onset  . Diabetes Mother   . Cancer Neg Hx   . Heart disease Neg Hx     Social History Social History  Substance Use Topics  . Smoking status: Never Smoker   . Smokeless tobacco: None  . Alcohol Use: No    Review of Systems  Constitutional: No fever.  Patient does report she gets chilled frequently. This is not new. ENT: Negative for congestion. Cardiovascular: Positive for chest pain. Respiratory: Negative for cough. Gastrointestinal: Left lower abdominal pain. Some nausea. See history of present illness Genitourinary: Negative for dysuria. Musculoskeletal: Mild bilateral edema per patient.. Skin: Negative for rash. Neurological: Negative for headache or focal weakness   10-point ROS otherwise negative.  ____________________________________________   PHYSICAL EXAM:  VITAL SIGNS: ED Triage Vitals  Enc Vitals Group     BP 11/23/15 1952 133/85 mmHg     Pulse Rate 11/23/15 1952 98     Resp 11/23/15 1952 20     Temp 11/23/15 1952 98.7 F (37.1 C)     Temp Source 11/23/15 1952 Oral     SpO2 11/23/15 1952 99 %     Weight 11/23/15 1952 320 lb (145.151 kg)     Height 11/23/15 1952 5' 7"  (1.702 m)     Head Cir --      Peak Flow --      Pain Score 11/23/15 1953 6     Pain Loc --      Pain Edu? --      Excl. in [place]? --     Constitutional: Alert and oriented. Well appearing and in no distress. ENT   Head: Normocephalic and atraumatic.   Nose: No congestion/rhinnorhea.       Mouth: No erythema, no swelling   Cardiovascular: Normal rate, regular rhythm, no murmur noted Chest wall: He does have tenderness over the mid and upper sternum. She reports this is the same pain she has been having. Respiratory:  Normal respiratory effort, no tachypnea.    Breath sounds are clear and equal bilaterally.  Gastrointestinal: Soft, no distention. She has some mild tenderness in the left lower abdomen, pelvic area. Back: No muscle spasm, no tenderness, no CVA tenderness. Musculoskeletal: No deformity noted. Nontender with normal range of motion in all extremities.  Minimal edema in both legs.  Neurologic:  Communicative. Normal appearing spontaneous movement in all 4 extremities. No gross focal neurologic deficits are appreciated.  Skin:   Skin is warm, dry. No rash noted. Psychiatric: Mood and affect are normal. Speech and behavior are normal.  ____________________________________________    LABS (pertinent positives/negatives)  Labs Reviewed  BASIC METABOLIC PANEL - Abnormal; Notable for the following:    Glucose, Bld 209 (*)    All  other components within normal limits  TROPONIN I - Abnormal; Notable for the following:    Troponin I 0.04 (*)    All other components within normal limits  CBC - Abnormal; Notable for the following:    WBC 16.3 (*)    RBC 5.34 (*)    MCV 78.0 (*)    MCH 25.3 (*)    All other components within normal limits  FIBRIN DERIVATIVES D-DIMER ([facility] ONLY)  URINALYSIS COMPLETEWITH MICROSCOPIC ([facility] ONLY)  PREGNANCY, URINE     ____________________________________________   EKG  ED ECG REPORT I, Azia Toutant W, the attending physician, personally viewed and interpreted this ECG.   Date: 11/23/2015  EKG Time: 1947  Rate: 93  Rhythm:  Normal sinus rhythm  Axis: Normal  Intervals: Normal  ST&T Change: None noted   ____________________________________________    RADIOLOGY  Chest x-ray:  ____________________________________________   PROCEDURES    ____________________________________________   INITIAL IMPRESSION / ASSESSMENT AND PLAN / ED COURSE  Pertinent labs & imaging results that were available during my care of the patient were reviewed by me and considered in my medical decision making (see chart for details).  Complicated case with a broad differential diagnosis in this 40 year old female with chest pain, abdominal pain, and mild bilateral edema.  Patient does have an elevated white blood cell count at 16,000. Chest x-ray is pending.  Patient's troponin is 0.04. My suspicion for ischemic disease is very low. We will repeat the troponin at approximately 11:00.  Given the discomfort and the swelling in both legs, I have discussed the possibility of a vascular  problem, embolic event, with the patient. I also think this is low risk. She does not have a history of clotting. She does not have unilateral swelling. She doesn't have any pleuritic pain. I think a d-dimer would be a reasonable next step. I discussed the lack of specificity of this test with the patient and she understands that it may lead to a CT scan of her chest.  The pain in her left abdomen pelvis is consistent with pain she's had before with fibroids and cyst. As we are waiting for other tests to come back, we will get a pelvic ultrasound for further evaluation.  Given the expected length of time to complete this evaluation, I have advised the patient that I will transition her care to my colleague, Dr. Karma Greaser.  ----------------------------------------- 10:58 PM on 11/23/2015 -----------------------------------------  D-dimer was negative. Chest x-ray was negative for focal infiltrate. Sternal troponin is now pending. Results from pelvic ultrasound is pending.  ____________________________________________   FINAL CLINICAL IMPRESSION(S) / ED DIAGNOSES  Final diagnoses:  Chest pain, unspecified chest pain type   pelvic pain     Ahmed Prima, MD 11/23/15 2258

## 2015-11-23 NOTE — ED Provider Notes (Signed)
----------------------------------------- 11:15 PM on 11/23/2015 -----------------------------------------   Blood pressure 133/85, pulse 96, temperature 98.7 F (37.1 C), temperature source Oral, resp. rate 20, height 5' 7"  (1.702 m), weight 145.151 kg, SpO2 98 %.  Assuming care from Dr. Thomasene Lot.  In short, Erin Good is a 40 y.o. female with a chief complaint of Chest Pain .  Refer to the original H&P for additional details.  The current plan of care is to follow-up with a repeat troponin, urinalysis, and the results of a pelvic ultrasound.  ----------------------------------------- 11:58 PM on 11/23/2015 -----------------------------------------  Repeat troponin is 0.03 which is down slightly from the original one.  Pelvic ultrasound is unremarkable with no acute abnormality.  Awaiting urine.  ----------------------------------------- 12:56 AM on 11/24/2015 -----------------------------------------  Grossly infected urine.  No hematuria to suggest kidney stones.  I will treat with an intramuscular dose of ceftriaxone 1 g and outpatient treatment sufficient for possible pyelonephritis.  The patient is in no acute distress at this time. She understands the diagnosis, treatment.    I gave my usual and customary return precautions.    Dg Chest 2 View  11/23/2015  CLINICAL DATA:  40 year old female with left-sided chest pain EXAM: CHEST  2 VIEW COMPARISON:  Radiograph dated 04/15/15 FINDINGS: The heart size and mediastinal contours are within normal limits. Both lungs are clear. The visualized skeletal structures are unremarkable. IMPRESSION: No active cardiopulmonary disease. Electronically Signed   By: Anner Crete M.D.   On: 11/23/2015 22:26   US Transvaginal Non-ob  11/23/2015  CLINICAL DATA:  Acute onset of left pelvic pain.  Initial encounter. EXAM: TRANSABDOMINAL AND TRANSVAGINAL ULTRASOUND OF PELVIS TECHNIQUE: Both transabdominal and transvaginal ultrasound  examinations of the pelvis were performed. Transabdominal technique was performed for global imaging of the pelvis including uterus, right ovary, adnexal regions, and pelvic cul-de-sac. It was necessary to proceed with endovaginal exam following the transabdominal exam to visualize the uterus and right ovary in greater detail. COMPARISON:  CT of the abdomen and pelvis performed 03/29/2012, and pelvic ultrasound performed 05/17/2015 FINDINGS: Uterus Measurements: 8.2 x 4.9 x 4.8 cm. A 1.7 x 1.7 x 1.6 cm fibroid is noted along the posterior uterine wall. A nabothian cyst is noted at the cervix. Endometrium Thickness: 0.5 cm. The patient's intrauterine device is noted likely in expected position at the fundus of the uterus. Right ovary Measurements: 3.4 x 2.6 x 2.7 cm. Normal appearance/no adnexal mass. Left ovary Status post left-sided oophorectomy. Other findings No free fluid is seen within the pelvis. IMPRESSION: 1. No acute abnormality seen in the pelvis. 2. Small uterine fibroid noted. Intrauterine device difficult to fully characterize but appears grossly unremarkable. The 3. Right ovary unremarkable in appearance. Status post left-sided oophorectomy. No evidence for ovarian torsion. Electronically Signed   By: Garald Balding M.D.   On: 11/23/2015 23:41   US Pelvis Complete  11/23/2015  CLINICAL DATA:  Acute onset of left pelvic pain.  Initial encounter. EXAM: TRANSABDOMINAL AND TRANSVAGINAL ULTRASOUND OF PELVIS TECHNIQUE: Both transabdominal and transvaginal ultrasound examinations of the pelvis were performed. Transabdominal technique was performed for global imaging of the pelvis including uterus, right ovary, adnexal regions, and pelvic cul-de-sac. It was necessary to proceed with endovaginal exam following the transabdominal exam to visualize the uterus and right ovary in greater detail. COMPARISON:  CT of the abdomen and pelvis performed 03/29/2012, and pelvic ultrasound performed 05/17/2015 FINDINGS:  Uterus Measurements: 8.2 x 4.9 x 4.8 cm. A 1.7 x 1.7 x 1.6 cm fibroid is  noted along the posterior uterine wall. A nabothian cyst is noted at the cervix. Endometrium Thickness: 0.5 cm. The patient's intrauterine device is noted likely in expected position at the fundus of the uterus. Right ovary Measurements: 3.4 x 2.6 x 2.7 cm. Normal appearance/no adnexal mass. Left ovary Status post left-sided oophorectomy. Other findings No free fluid is seen within the pelvis. IMPRESSION: 1. No acute abnormality seen in the pelvis. 2. Small uterine fibroid noted. Intrauterine device difficult to fully characterize but appears grossly unremarkable. The 3. Right ovary unremarkable in appearance. Status post left-sided oophorectomy. No evidence for ovarian torsion. Electronically Signed   By: Garald Balding M.D.   On: 11/23/2015 23:41   New Prescriptions   CEPHALEXIN (KEFLEX) 500 MG CAPSULE    Take 1 capsule (500 mg total) by mouth 3 (three) times daily.      Hinda Kehr, MD 11/24/15 [phone]

## 2015-11-23 NOTE — ED Notes (Signed)
Pt reports left side  chest pain for 3days.  No sob.  No v/d.  Intermittent nausea.  Nonsmoker. Pt alert.

## 2015-11-24 LAB — URINALYSIS COMPLETE WITH MICROSCOPIC (ARMC ONLY)
Bilirubin Urine: NEGATIVE
GLUCOSE, UA: NEGATIVE mg/dL
KETONES UR: NEGATIVE mg/dL
NITRITE: NEGATIVE
PROTEIN: 30 mg/dL — AB
SPECIFIC GRAVITY, URINE: 1.02 (ref 1.005–1.030)
pH: 7 (ref 5.0–8.0)

## 2015-11-24 LAB — POCT PREGNANCY, URINE: PREG TEST UR: NEGATIVE

## 2015-11-24 MED ORDER — CEFTRIAXONE SODIUM 1 G IJ SOLR
1.0000 g | Freq: Once | INTRAMUSCULAR | Status: AC
  Administered 2015-11-24: 1 g via INTRAMUSCULAR
  Filled 2015-11-24: qty 10

## 2015-11-24 MED ORDER — DEXTROSE 5 % IV SOLN: 1.0000 g | INTRAVENOUS | Status: DC

## 2015-11-24 MED ORDER — CEPHALEXIN 500 MG PO CAPS: 500.0000 mg | ORAL_CAPSULE | Freq: Three times a day (TID) | ORAL | Status: DC

## 2015-11-24 NOTE — Discharge Instructions (Signed)
Your chest pain appears to be musculo-skeletal.  Your chest x-ray was ok.  Your cardiac enzyme was ok twice.  Your test for a possible blood clot was negative.  Additionally, you have a urinary tract infection (UTI) for which you were given a shot of antibiotics and a prescription.  Please take the full course as prescribed.  Follow up with your regular doctor for further evaluation and care. Return to the [facility] if you have further pain, shortness of breath, if you have fever, or if you have other urgent concerns.   Nonspecific Chest Pain It is often hard to find the cause of chest pain. There is always a chance that your pain could be related to something serious, such as a heart attack or a blood clot in your lungs. Chest pain can also be caused by conditions that are not life-threatening. If you have chest pain, it is very important to follow up with your doctor.  HOME CARE  If you were prescribed an antibiotic medicine, finish it all even if you start to feel better.  Avoid any activities that cause chest pain.  Do not use any tobacco products, including cigarettes, chewing tobacco, or electronic cigarettes. If you need help quitting, ask your doctor.  Do not drink alcohol.  Take medicines only as told by your doctor.  Keep all follow-up visits as told by your doctor. This is important. This includes any further testing if your chest pain does not go away.  Your doctor may tell you to keep your head raised (elevated) while you sleep.  Make lifestyle changes as told by your doctor. These may include:  Getting regular exercise. Ask your doctor to suggest some activities that are safe for you.  Eating a heart-healthy diet. Your doctor or a diet specialist (dietitian) can help you to learn healthy eating options.  Maintaining a healthy weight.  Managing diabetes, if necessary.  Reducing stress. GET HELP IF:  Your chest pain does not go away, even after treatment.  You have  a rash with blisters on your chest.  You have a fever. GET HELP RIGHT AWAY IF:  Your chest pain is worse.  You have an increasing cough, or you cough up blood.  You have severe belly (abdominal) pain.  You feel extremely weak.  You pass out (faint).  You have chills.  You have sudden, unexplained chest discomfort.  You have sudden, unexplained discomfort in your arms, back, neck, or jaw.  You have shortness of breath at any time.  You suddenly start to sweat, or your skin gets clammy.  You feel nauseous.  You vomit.  You suddenly feel light-headed or dizzy.  Your heart begins to beat quickly, or it feels like it is skipping beats. These symptoms may be an emergency. Do not wait to see if the symptoms will go away. Get medical help right away. Call your local emergency services (911 in the [place] Do not drive yourself to the hospital.   This information is not intended to replace advice given to you by your health care provider. Make sure you discuss any questions you have with your health care provider.   Document Released: 04/09/2008 Document Revised: 11/12/2014 Document Reviewed: 05/28/2014 Elsevier Interactive Patient Education 2016 Elsevier Inc.  Pelvic Pain, Female Female pelvic pain can be caused by many different things and start from a variety of places. Pelvic pain refers to pain that is located in the lower half of the abdomen and between your hips.  The pain may occur over a short period of time (acute) or may be reoccurring (chronic). The cause of pelvic pain may be related to disorders affecting the female reproductive organs (gynecologic), but it may also be related to the bladder, kidney stones, an intestinal complication, or muscle or skeletal problems. Getting help right away for pelvic pain is important, especially if there has been severe, sharp, or a sudden onset of unusual pain. It is also important to get help right away because some types of pelvic pain  can be life threatening.  CAUSES  Below are only some of the causes of pelvic pain. The causes of pelvic pain can be in one of several categories.   Gynecologic.  Pelvic inflammatory disease.  Sexually transmitted infection.  Ovarian cyst or a twisted ovarian ligament (ovarian torsion).  Uterine lining that grows outside the uterus (endometriosis).  Fibroids, cysts, or tumors.  Ovulation.  Pregnancy.  Pregnancy that occurs outside the uterus (ectopic pregnancy).  Miscarriage.  Labor.  Abruption of the placenta or ruptured uterus.  Infection.  Uterine infection (endometritis).  Bladder infection.  Diverticulitis.  Miscarriage related to a uterine infection (septic abortion).  Bladder.  Inflammation of the bladder (cystitis).  Kidney stone(s).  Gastrointestinal.  Constipation.  Diverticulitis.  Neurologic.  Trauma.  Feeling pelvic pain because of mental or emotional causes (psychosomatic).  Cancers of the bowel or pelvis. EVALUATION  Your caregiver will want to take a careful history of your concerns. This includes recent changes in your health, a careful gynecologic history of your periods (menses), and a sexual history. Obtaining your family history and medical history is also important. Your caregiver may suggest a pelvic exam. A pelvic exam will help identify the location and severity of the pain. It also helps in the evaluation of which organ system may be involved. In order to identify the cause of the pelvic pain and be properly treated, your caregiver may order tests. These tests may include:   A pregnancy test.  Pelvic ultrasonography.  An X-ray exam of the abdomen.  A urinalysis or evaluation of vaginal discharge.  Blood tests. HOME CARE INSTRUCTIONS   Only take over-the-counter or prescription medicines for pain, discomfort, or fever as directed by your caregiver.   Rest as directed by your caregiver.   Eat a balanced diet.    Drink enough fluids to make your urine clear or pale yellow, or as directed.   Avoid sexual intercourse if it causes pain.   Apply warm or cold compresses to the lower abdomen depending on which one helps the pain.   Avoid stressful situations.   Keep a journal of your pelvic pain. Write down when it started, where the pain is located, and if there are things that seem to be associated with the pain, such as food or your menstrual cycle.  Follow up with your caregiver as directed.  SEEK MEDICAL CARE IF:  Your medicine does not help your pain.  You have abnormal vaginal discharge. SEEK IMMEDIATE MEDICAL CARE IF:   You have heavy bleeding from the vagina.   Your pelvic pain increases.   You feel light-headed or faint.   You have chills.   You have pain with urination or blood in your urine.   You have uncontrolled diarrhea or vomiting.   You have a fever or persistent symptoms for more than 3 days.  You have a fever and your symptoms suddenly get worse.   You are being physically or sexually abused.  This information is not intended to replace advice given to you by your health care provider. Make sure you discuss any questions you have with your health care provider.   Document Released: 09/18/2004 Document Revised: 07/13/2015 Document Reviewed: 02/11/2012 Elsevier Interactive Patient Education [employer].

## 2015-11-27 LAB — URINE CULTURE: SPECIAL REQUESTS: NORMAL

## 2015-12-21 ENCOUNTER — Ambulatory Visit (INDEPENDENT_AMBULATORY_CARE_PROVIDER_SITE_OTHER): Payer: BLUE CROSS/BLUE SHIELD | Admitting: Obstetrics and Gynecology

## 2015-12-21 ENCOUNTER — Encounter: Payer: Self-pay | Admitting: Obstetrics and Gynecology

## 2015-12-21 VITALS — BP 113/78 | HR 114 | Ht 67.0 in | Wt 321.1 lb

## 2015-12-21 DIAGNOSIS — Z30432 Encounter for removal of intrauterine contraceptive device: Secondary | ICD-10-CM

## 2015-12-21 DIAGNOSIS — D252 Subserosal leiomyoma of uterus: Secondary | ICD-10-CM | POA: Diagnosis not present

## 2015-12-21 DIAGNOSIS — N921 Excessive and frequent menstruation with irregular cycle: Secondary | ICD-10-CM

## 2015-12-21 DIAGNOSIS — N898 Other specified noninflammatory disorders of vagina: Secondary | ICD-10-CM

## 2015-12-21 NOTE — Progress Notes (Signed)
     GYNECOLOGY CLINIC PROCEDURE NOTE  Erin Good is a 40 y.o. G13P2002 female with h/o heavy irregular menses and fibroid uterus here for Skyla IUD removal. Was placed in 07/2015.  Notes that it did help significantly with menstrual cycles (very light to no bleeding), however patient has been having recurrent yeast infections and vaginal irritation since insertion.  Also notes dark circles and ring around ankles bilaterally.   IUD Removal  Patient identified, informed consent performed, consent signed.  Patient was in the dorsal lithotomy position, normal external genitalia was noted.  A speculum was placed in the patient's vagina, normal discharge was noted, no lesions. The cervix was visualized, no lesions, scant thin white discharge was appreciated.  The strings of the IUD were grasped and pulled using ring forceps. The IUD was removed in its entirety.  Patient tolerated the procedure well.    Patient with h/o BTL for contraception. Discussion had on alternative management of menstrual periods and fibroids, including tranexamic acid (Lysteda), oral progesterone, Depo Provera, endometrial ablation (Novasure/Hydrothermal Ablation) or hysterectomy as definitive surgical management.  Discussed risks and benefits of each method.   Patient desires no intervention for now. Will follow up if symptoms warrant therapy.  Routine preventative health maintenance measures emphasized.  Microscopic wet-mount exam performed today, shows negative for pathogens, normal epithelial cells.    [name], MD [facility]

## 2016-04-25 ENCOUNTER — Encounter: Payer: Self-pay | Admitting: Obstetrics and Gynecology

## 2016-04-25 ENCOUNTER — Ambulatory Visit (INDEPENDENT_AMBULATORY_CARE_PROVIDER_SITE_OTHER): Payer: BLUE CROSS/BLUE SHIELD | Admitting: Obstetrics and Gynecology

## 2016-04-25 VITALS — BP 128/83 | HR 112 | Wt 318.2 lb

## 2016-04-25 DIAGNOSIS — N898 Other specified noninflammatory disorders of vagina: Secondary | ICD-10-CM

## 2016-04-25 DIAGNOSIS — N9489 Other specified conditions associated with female genital organs and menstrual cycle: Secondary | ICD-10-CM

## 2016-04-25 MED ORDER — FLUCONAZOLE 150 MG PO TABS: 150.0000 mg | ORAL_TABLET | Freq: Once | ORAL | Status: DC

## 2016-04-25 NOTE — Progress Notes (Signed)
    GYNECOLOGY PROGRESS NOTE  Subjective:    Patient ID: Erin Good, female    DOB: 1976/06/16, 40 y.o.   MRN: [id number]  HPI  Patient is a 40 y.o. G2P2 female who presents for complaints of vaginal mass or nodule that has been present x 2-3 months.  Notes that it is occasionally tender, denies drainage. Denies possible STD exposure, vaginal discharge.   The following portions of the patient's history were reviewed and updated as appropriate: allergies, current medications, past family history, past medical history, past social history, past surgical history and problem list.  Review of Systems Pertinent items noted in HPI and remainder of comprehensive ROS otherwise negative.   Objective:   Blood pressure 128/83, pulse 112, weight 318 lb 4 oz (144.357 kg). General appearance: alert and no distress Pelvic: external genitalia normal, rectovaginal septum normal.  Vagina with slight enlargement of Skene's duct on right.  No discharge expressed, non-tender.  No other palpable masses felt..  Cervix normal appearing, no lesions and no motion tenderness.  Uterus mobile, nontender, normal shape and size.  Adnexae non-palpable, nontender bilaterally.  Extremities: extremities normal, atraumatic, no cyanosis or edema   Assessment:   Enlarged Skene's duct  Plan:   - Discussion had that enlargement of Skene's duct can occur and resolve spontaneously.  No evidence of infection.  Unable to express any fluid.  No intervention required at this time.  - To f/u if symptoms worsen.  RTC in 2 months for annual exam.    Erin Maid, MD [facility] 04/30/2016 7:47 PM

## 2016-06-07 ENCOUNTER — Emergency Department (HOSPITAL_COMMUNITY): Payer: BLUE CROSS/BLUE SHIELD

## 2016-06-07 ENCOUNTER — Emergency Department (HOSPITAL_COMMUNITY)
Admission: EM | Admit: 2016-06-07 | Discharge: 2016-06-07 | Disposition: A | Discharge status: Home / Self Care | Payer: BLUE CROSS/BLUE SHIELD | Attending: Emergency Medicine | Admitting: Emergency Medicine

## 2016-06-07 ENCOUNTER — Encounter (HOSPITAL_COMMUNITY): Payer: Self-pay | Admitting: Emergency Medicine

## 2016-06-07 DIAGNOSIS — R102 Pelvic and perineal pain: Secondary | ICD-10-CM

## 2016-06-07 DIAGNOSIS — Z79899 Other long term (current) drug therapy: Secondary | ICD-10-CM | POA: Diagnosis not present

## 2016-06-07 DIAGNOSIS — N939 Abnormal uterine and vaginal bleeding, unspecified: Secondary | ICD-10-CM

## 2016-06-07 DIAGNOSIS — E119 Type 2 diabetes mellitus without complications: Secondary | ICD-10-CM | POA: Diagnosis not present

## 2016-06-07 DIAGNOSIS — R1030 Lower abdominal pain, unspecified: Secondary | ICD-10-CM | POA: Diagnosis present

## 2016-06-07 LAB — CBC
HEMATOCRIT: 39.4 % (ref 36.0–46.0)
HEMOGLOBIN: 13.7 g/dL (ref 12.0–15.0)
MCH: 27.1 pg (ref 26.0–34.0)
MCHC: 34.8 g/dL (ref 30.0–36.0)
MCV: 77.9 fL — ABNORMAL LOW (ref 78.0–100.0)
Platelets: 312 10*3/uL (ref 150–400)
RBC: 5.06 MIL/uL (ref 3.87–5.11)
RDW: 12.9 % (ref 11.5–15.5)
WBC: 17.6 10*3/uL — ABNORMAL HIGH (ref 4.0–10.5)

## 2016-06-07 LAB — URINALYSIS, ROUTINE W REFLEX MICROSCOPIC
BILIRUBIN URINE: NEGATIVE
Glucose, UA: >1000 mg/dL — AB
Ketones, ur: 40 mg/dL — AB
Leukocytes, UA: NEGATIVE
Nitrite: NEGATIVE
PH: 6.5 (ref 5.0–8.0)
Protein, ur: NEGATIVE mg/dL
SPECIFIC GRAVITY, URINE: 1.044 — AB (ref 1.005–1.030)

## 2016-06-07 LAB — COMPREHENSIVE METABOLIC PANEL
ALBUMIN: 4.3 g/dL (ref 3.5–5.0)
ALK PHOS: 105 U/L (ref 38–126)
ALT: 25 U/L (ref 14–54)
ANION GAP: 12 (ref 5–15)
AST: 20 U/L (ref 15–41)
BUN: 9 mg/dL (ref 6–20)
CALCIUM: 9 mg/dL (ref 8.9–10.3)
CO2: 21 mmol/L — AB (ref 22–32)
Chloride: 102 mmol/L (ref 101–111)
Creatinine, Ser: 0.66 mg/dL (ref 0.44–1.00)
GFR calc Af Amer: >60 mL/min (ref >60)
GFR calc non Af Amer: >60 mL/min (ref >60)
GLUCOSE: 319 mg/dL — AB (ref 65–99)
POTASSIUM: 3.7 mmol/L (ref 3.5–5.1)
SODIUM: 135 mmol/L (ref 135–145)
Total Bilirubin: 0.6 mg/dL (ref 0.3–1.2)
Total Protein: 8.2 g/dL — ABNORMAL HIGH (ref 6.5–8.1)

## 2016-06-07 LAB — URINE MICROSCOPIC-ADD ON: BACTERIA UA: NONE SEEN

## 2016-06-07 LAB — LIPASE, BLOOD: Lipase: 26 U/L (ref 11–51)

## 2016-06-07 LAB — PREGNANCY, URINE: Preg Test, Ur: NEGATIVE

## 2016-06-07 MED ORDER — NAPROXEN 500 MG PO TABS: 500.0000 mg | ORAL_TABLET | Freq: Two times a day (BID) | ORAL | 0 refills | Status: DC

## 2016-06-07 MED ORDER — ALBUTEROL SULFATE (2.5 MG/3ML) 0.083% IN NEBU
5.0000 mg | INHALATION_SOLUTION | Freq: Once | RESPIRATORY_TRACT | Status: AC
  Administered 2016-06-07: 5 mg via RESPIRATORY_TRACT
  Filled 2016-06-07: qty 6

## 2016-06-07 NOTE — ED Triage Notes (Signed)
Patient states she has abdominal pain in right and left lower quadrant.  Has some n/v denies diarrhea.  She states she has some SOB associated with her pain.  Denies fever or chilis.  She is complaining of some dizziness.

## 2016-06-07 NOTE — ED Provider Notes (Addendum)
Erin Good DEPT Provider Note   CSN: [id number] Arrival date & time: 06/07/16  1759  First Provider Contact:  First MD Initiated Contact with Patient 06/07/16 2210        History   Chief Complaint Chief Complaint  Patient presents with  . Abdominal Pain  . Nausea  . Shortness of Breath    HPI Erin Good is a 40 y.o. female.  Patient with a history of diabetes, kidney stones and presents with abdominal pain. She states it started yesterday while she was at work. 6 across her lower abdomen. She states it got markedly worse today. She has some associated nausea and vomiting. No change in bowel habits. No urinary symptoms. She did have a little episode of shortness of breath during this worsening spell. She currently denies shortness of breath. No chest pain. She started having some vaginal bleeding with a large clot today.      Past Medical History:  Diagnosis Date  . Allergic rhinitis   . Diabetes mellitus without complication ([place])   . Dyslipidemia   . Epilepsy ([place])   . Epilepsy ([place])   . Galactorrhea   . Hx gestational diabetes   . Kidney stones   . Obesity   . Seizures ([place])   . Sleep apnea   . Syncope and collapse   . Tachycardia     Patient Active Problem List   Diagnosis Date Noted  . Acute maxillary sinusitis 07/20/2015  . Cough, persistent 07/20/2015  . Depression with anxiety 05/06/2015  . Pelvic pain in female 05/06/2015  . Allergic rhinitis 04/20/2015  . Anxiety and depression 04/20/2015  . 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 Gastro-esophageal reflux disease without esophagitis 04/20/2015  . Dysmetabolic syndrome 05/69/7948  . Extreme obesity ([place]) 04/20/2015  . NASH (nonalcoholic steatohepatitis) 04/20/2015  . Allergy to nuts 04/20/2015  . Calculus of kidney 04/20/2015  . 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 Central sleep apnea 11/26/2008  . Dyslipidemia 07/02/2008  . Leukocytosis  07/29/2007    Past Surgical History:  Procedure Laterality Date  . CESAREAN SECTION     X 2  . RIGHT OOPHORECTOMY Right 2001   benign tumor  . TUBAL LIGATION  2007    OB History    Gravida Para Term Preterm AB Living   2 2           SAB TAB Ectopic Multiple Live Births                   Home Medications    Prior to Admission medications   Medication Sig Start Date End Date Taking? Authorizing Provider  chlorzoxazone (PARAFON) 500 MG tablet Take 500 mg by mouth every 6 (six) hours as needed for muscle spasms.   Yes Historical Provider, MD  clonazePAM (KLONOPIN) 0.5 MG tablet Take 1 tablet by mouth at bedtime as needed. Reported on 12/21/2015 01/17/15  Yes Historical Provider, MD  EPINEPHrine (EPIPEN 2-PAK) 0.3 mg/0.3 mL IJ SOAJ injection Inject 1 pen as directed as needed. 06/14/14  Yes Historical Provider, MD  Liraglutide (VICTOZA) 18 MG/3ML SOPN Inject 0.3 mLs (1.8 mg total) into the skin daily. 05/06/15  Yes Steele Sizer, MD  zonisamide (ZONEGRAN) 100 MG capsule Take 500 mg by mouth at bedtime.    Yes Historical Provider, MD  DULoxetine (CYMBALTA) 60 MG capsule Take 1 capsule (60 mg total) by mouth daily. Patient not taking: Reported on 06/07/2016 06/27/15   Steele Sizer, MD  fluconazole (DIFLUCAN) 150 MG tablet Take 1 tablet (150 mg total) by mouth once. Can take additional dose three days later if symptoms persist Patient not taking: Reported on 06/07/2016 04/25/16   [name], MD  glucose blood (ONETOUCH VERIO) test strip 1 strip by Does not apply route daily. Reported on 12/21/2015 10/19/13   Historical Provider, MD  Levonorgestrel 13.5 MG IUD 1 Device by Intrauterine route once. Every 3 years Patient not taking: Reported on 06/07/2016 07/29/15   [name], MD  naproxen (NAPROSYN) 500 MG tablet Take 1 tablet (500 mg total) by mouth 2 (two) times daily. 06/07/16   Malvin Johns, MD    Family History Family History  Problem Relation Age of Onset  . Diabetes Mother   . Cancer  Neg Hx   . Heart disease Neg Hx     Social History Social History  Substance Use Topics  . Smoking status: Never Smoker  . Smokeless tobacco: Never Used  . Alcohol use No     Allergies   Aspirin and Peanuts  [peanut oil]   Review of Systems Review of Systems  Constitutional: Negative for chills, diaphoresis, fatigue and fever.  HENT: Negative for congestion, rhinorrhea and sneezing.   Eyes: Negative.   Respiratory: Positive for shortness of breath. Negative for cough and chest tightness.   Cardiovascular: Negative for chest pain and leg swelling.  Gastrointestinal: Positive for abdominal pain, nausea and vomiting. Negative for blood in stool and diarrhea.  Genitourinary: Positive for vaginal bleeding. Negative for difficulty urinating, flank pain, frequency, hematuria and vaginal discharge.  Musculoskeletal: Negative for arthralgias and back pain.  Skin: Negative for rash.  Neurological: Negative for dizziness, speech difficulty, weakness, numbness and headaches.     Physical Exam Updated Vital Signs BP (!) 125/47 (BP Location: Right Arm)   Pulse (!) 58   Temp 98.6 F (37 C) (Oral)   Resp 18   LMP 05/14/2016 Comment: IUD removed in 2-17  SpO2 99%   Physical Exam  Constitutional: She is oriented to person, place, and time. She appears well-developed and well-nourished.  HENT:  Head: Normocephalic and atraumatic.  Eyes: Pupils are equal, round, and reactive to light.  Neck: Normal range of motion. Neck supple.  Cardiovascular: Normal rate, regular rhythm and normal heart sounds.   Pulmonary/Chest: Effort normal and breath sounds normal. No respiratory distress. She has no wheezes. She has no rales. She exhibits no tenderness.  Abdominal: Soft. Bowel sounds are normal. There is tenderness (Positive tenderness to suprapubic area). There is no rebound and no guarding.  Genitourinary:  Genitourinary Comments: Pelvic exam shows some scant vaginal bleeding. There is  positive tenderness over the uterus. No adnexal tenderness. No cervical motion tenderness. No discharge.  Musculoskeletal: Normal range of motion. She exhibits no edema.  Lymphadenopathy:    She has no cervical adenopathy.  Neurological: She is alert and oriented to person, place, and time.  Skin: Skin is warm and dry. No rash noted.  Psychiatric: She has a normal mood and affect.     ED Treatments / Results  Labs (all labs ordered are listed, but only abnormal results are displayed) Results for orders placed or performed during the hospital encounter of 06/07/16  Lipase, blood  Result Value Ref Range   Lipase 26 11 - 51 U/L  Comprehensive metabolic panel  Result Value Ref Range   Sodium 135 135 - 145 mmol/L   Potassium 3.7 3.5 - 5.1 mmol/L   Chloride 102 101 - 111 mmol/L  CO2 21 (L) 22 - 32 mmol/L   Glucose, Bld 319 (H) 65 - 99 mg/dL   BUN 9 6 - 20 mg/dL   Creatinine, Ser 0.66 0.44 - 1.00 mg/dL   Calcium 9.0 8.9 - 10.3 mg/dL   Total Protein 8.2 (H) 6.5 - 8.1 g/dL   Albumin 4.3 3.5 - 5.0 g/dL   AST 20 15 - 41 U/L   ALT 25 14 - 54 U/L   Alkaline Phosphatase 105 38 - 126 U/L   Total Bilirubin 0.6 0.3 - 1.2 mg/dL   GFR calc non Af Amer >60 >60 mL/min   GFR calc Af Amer >60 >60 mL/min   Anion gap 12 5 - 15  CBC  Result Value Ref Range   WBC 17.6 (H) 4.0 - 10.5 K/uL   RBC 5.06 3.87 - 5.11 MIL/uL   Hemoglobin 13.7 12.0 - 15.0 g/dL   HCT 39.4 36.0 - 46.0 %   MCV 77.9 (L) 78.0 - 100.0 fL   MCH 27.1 26.0 - 34.0 pg   MCHC 34.8 30.0 - 36.0 g/dL   RDW 12.9 11.5 - 15.5 %   Platelets 312 150 - 400 K/uL  Urinalysis, Routine w reflex microscopic  Result Value Ref Range   Color, Urine YELLOW YELLOW   APPearance CLEAR CLEAR   Specific Gravity, Urine 1.044 (H) 1.005 - 1.030   pH 6.5 5.0 - 8.0   Glucose, UA >1000 (A) NEGATIVE mg/dL   Hgb urine dipstick LARGE (A) NEGATIVE   Bilirubin Urine NEGATIVE NEGATIVE   Ketones, ur 40 (A) NEGATIVE mg/dL   Protein, ur NEGATIVE NEGATIVE mg/dL    Nitrite NEGATIVE NEGATIVE   Leukocytes, UA NEGATIVE NEGATIVE  Pregnancy, urine  Result Value Ref Range   Preg Test, Ur NEGATIVE NEGATIVE  Urine microscopic-add on  Result Value Ref Range   Squamous Epithelial / LPF 0-5 (A) NONE SEEN   WBC, UA 0-5 0 - 5 WBC/hpf   RBC / HPF 6-30 0 - 5 RBC/hpf   Bacteria, UA NONE SEEN NONE SEEN   Dg Chest 2 View  Result Date: 06/07/2016 CLINICAL DATA:  Chest pain and shortness of breath, onset today. EXAM: CHEST  2 VIEW COMPARISON:  11/23/2015 FINDINGS: The cardiomediastinal contours are normal. The lungs are clear. Pulmonary vasculature is normal. No consolidation, pleural effusion, or pneumothorax. No acute osseous abnormalities are seen. IMPRESSION: No acute pulmonary process. Electronically Signed   By: Jeb Levering M.D.   On: 06/07/2016 19:00     EKG  EKG Interpretation  Date/Time:  Thursday June 07 2016 18:36:53 EDT Ventricular Rate:  59 PR Interval:    QRS Duration: 100 QT Interval:  419 QTC Calculation: 415 R Axis:   41 Text Interpretation:  Sinus rhythm Confirmed by Jeneen Rinks  MD, [place] ([id number]) on 06/07/2016 8:23:27 PM       Radiology Dg Chest 2 View  Result Date: 06/07/2016 CLINICAL DATA:  Chest pain and shortness of breath, onset today. EXAM: CHEST  2 VIEW COMPARISON:  11/23/2015 FINDINGS: The cardiomediastinal contours are normal. The lungs are clear. Pulmonary vasculature is normal. No consolidation, pleural effusion, or pneumothorax. No acute osseous abnormalities are seen. IMPRESSION: No acute pulmonary process. Electronically Signed   By: Jeb Levering M.D.   On: 06/07/2016 19:00    Procedures Procedures (including critical care time)  Medications Ordered in ED Medications  albuterol (PROVENTIL) (2.5 MG/3ML) 0.083% nebulizer solution 5 mg (5 mg Nebulization Given 06/07/16 2317)     Initial Impression / Assessment  and Plan / ED Course  I have reviewed the triage vital signs and the nursing notes.  Pertinent labs &  imaging results that were available during my care of the patient were reviewed by me and considered in my medical decision making (see chart for details).  Clinical Course    Patient presents with lower abdominal pain. I think it's coming from her pelvic region. I don't see any signs of infection. She has associated vaginal bleeding that started today as well. She states she passed a large clot in her abdominal pain improved after this. She is comfortable at this point and has a benign abdominal exam. Her hemoglobin is stable. Her white count is mildly elevated but she has no fever or other signs of infection. Her urinalysis is unremarkable. She has blood but she does have associated vaginal bleeding. Her pregnancy test is negative. I did advise the patient that her glucose was elevated at 300. The option of doing a pelvic ultrasound today or she can follow-up with her OB/GYN. She is opted to follow-up with her OB/GYN which I feel is appropriate. She was given precautions to return to the [facility] if she has any heavier bleeding, worsening abdominal pain or fevers.  I had originally prescribed Naprosyn for her symptoms but she states this is triggered her seizures in the past. I advised her to continue Tylenol at home for symptomatic relief.  Final Clinical Impressions(s) / ED Diagnoses   Final diagnoses:  Vaginal bleeding  Pelvic pain in female    New Prescriptions New Prescriptions   NAPROXEN (NAPROSYN) 500 MG TABLET    Take 1 tablet (500 mg total) by mouth 2 (two) times daily.     Malvin Johns, MD 06/07/16 [place], MD 06/07/16 [phone]

## 2016-07-16 ENCOUNTER — Encounter (HOSPITAL_COMMUNITY): Payer: Self-pay | Admitting: Emergency Medicine

## 2016-07-16 ENCOUNTER — Ambulatory Visit (HOSPITAL_COMMUNITY)
Admission: EM | Admit: 2016-07-16 | Discharge: 2016-07-16 | Disposition: A | Discharge status: Nursing Home (Includes ICF) | Payer: BLUE CROSS/BLUE SHIELD | Attending: Family Medicine | Admitting: Family Medicine

## 2016-07-16 ENCOUNTER — Encounter (HOSPITAL_COMMUNITY): Payer: Self-pay | Admitting: Family Medicine

## 2016-07-16 DIAGNOSIS — R112 Nausea with vomiting, unspecified: Secondary | ICD-10-CM | POA: Insufficient documentation

## 2016-07-16 DIAGNOSIS — E119 Type 2 diabetes mellitus without complications: Secondary | ICD-10-CM | POA: Insufficient documentation

## 2016-07-16 DIAGNOSIS — K529 Noninfective gastroenteritis and colitis, unspecified: Secondary | ICD-10-CM | POA: Diagnosis not present

## 2016-07-16 DIAGNOSIS — Z5321 Procedure and treatment not carried out due to patient leaving prior to being seen by health care provider: Secondary | ICD-10-CM | POA: Insufficient documentation

## 2016-07-16 DIAGNOSIS — R197 Diarrhea, unspecified: Secondary | ICD-10-CM | POA: Diagnosis not present

## 2016-07-16 LAB — POCT I-STAT, CHEM 8
BUN: 4 mg/dL — ABNORMAL LOW (ref 6–20)
CALCIUM ION: 1.1 mmol/L — AB (ref 1.15–1.40)
CHLORIDE: 96 mmol/L — AB (ref 101–111)
Creatinine, Ser: 0.5 mg/dL (ref 0.44–1.00)
Glucose, Bld: 308 mg/dL — ABNORMAL HIGH (ref 65–99)
HEMATOCRIT: 45 % (ref 36.0–46.0)
Hemoglobin: 15.3 g/dL — ABNORMAL HIGH (ref 12.0–15.0)
POTASSIUM: 3.4 mmol/L — AB (ref 3.5–5.1)
SODIUM: 133 mmol/L — AB (ref 135–145)
TCO2: 22 mmol/L (ref 0–100)

## 2016-07-16 LAB — POCT PREGNANCY, URINE: PREG TEST UR: NEGATIVE

## 2016-07-16 MED ORDER — ONDANSETRON 4 MG PO TBDP
ORAL_TABLET | ORAL | Status: AC
  Filled 2016-07-16: qty 2

## 2016-07-16 MED ORDER — ONDANSETRON 4 MG PO TBDP
8.0000 mg | ORAL_TABLET | Freq: Once | ORAL | Status: AC
  Administered 2016-07-16: 8 mg via ORAL

## 2016-07-16 NOTE — ED Triage Notes (Signed)
Pt sent here from [facility] she was told she was dehydrated. Pt has had n/v/d since yesterday.

## 2016-07-16 NOTE — ED Triage Notes (Signed)
Pt here for N,V,D,dizziness that started Sunday. Denies being around anybody sick. Denies any specific pain. sts she just doesn't feel right.

## 2016-07-16 NOTE — ED Provider Notes (Addendum)
[facility]    CSN: [id number] Arrival date & time: 07/16/16  3086  First Provider Contact:  First MD Initiated Contact with Patient 07/16/16 2042        History   Chief Complaint Chief Complaint  Patient presents with  . Nausea  . Emesis  . Dizziness    HPI Erin Good is a 40 y.o. female.   The history is provided by the patient.  Emesis  Severity:  Moderate Duration:  2 days Timing:  Intermittent Quality:  Stomach contents Progression:  Unchanged Chronicity:  New (started on sun with persistence of sx tonight) Ineffective treatments:  None tried Associated symptoms: abdominal pain and diarrhea   Associated symptoms: no fever   Risk factors: diabetes   Risk factors: not pregnant, no sick contacts, no suspect food intake and no travel to endemic areas   Dizziness  Associated symptoms: diarrhea, nausea and vomiting     Past Medical History:  Diagnosis Date  . Allergic rhinitis   . Diabetes mellitus without complication ([place])   . Dyslipidemia   . Epilepsy ([place])   . Epilepsy ([place])   . Galactorrhea   . Hx gestational diabetes   . Kidney stones   . Obesity   . Seizures ([place])   . Sleep apnea   . Syncope and collapse   . Tachycardia     Patient Active Problem List   Diagnosis Date Noted  . Acute maxillary sinusitis 07/20/2015  . Cough, persistent 07/20/2015  . Depression with anxiety 05/06/2015  . Pelvic pain in female 05/06/2015  . Allergic rhinitis 04/20/2015  . Anxiety and depression 04/20/2015  . 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 Gastro-esophageal reflux disease without esophagitis 04/20/2015  . Dysmetabolic syndrome 57/84/6962  . Extreme obesity ([place]) 04/20/2015  . NASH (nonalcoholic steatohepatitis) 04/20/2015  . Allergy to nuts 04/20/2015  . Calculus of kidney 04/20/2015  . Type 2 diabetes mellitus with renal manifestations ([place]) 04/20/2015  . Central sleep apnea 11/26/2008  . Dyslipidemia 07/02/2008  .  Leukocytosis 07/29/2007    Past Surgical History:  Procedure Laterality Date  . CESAREAN SECTION     X 2  . RIGHT OOPHORECTOMY Right 2001   benign tumor  . TUBAL LIGATION  2007    OB History    Gravida Para Term Preterm AB Living   2 2           SAB TAB Ectopic Multiple Live Births                   Home Medications    Prior to Admission medications   Medication Sig Start Date End Date Taking? Authorizing Provider  EPINEPHrine (EPIPEN 2-PAK) 0.3 mg/0.3 mL IJ SOAJ injection Inject 1 pen as directed as needed. 06/14/14   Historical Provider, MD  glucose blood (ONETOUCH VERIO) test strip 1 strip by Does not apply route daily. Reported on 12/21/2015 10/19/13   Historical Provider, MD  Liraglutide (VICTOZA) 18 MG/3ML SOPN Inject 0.3 mLs (1.8 mg total) into the skin daily. 05/06/15   Steele Sizer, MD  zonisamide (ZONEGRAN) 100 MG capsule Take 500 mg by mouth at bedtime.     Historical Provider, MD    Family History Family History  Problem Relation Age of Onset  . Diabetes Mother   . Cancer Neg Hx   . Heart disease Neg Hx     Social History Social History  Substance Use Topics  . Smoking status: Never Smoker  . Smokeless  tobacco: Never Used  . Alcohol use No     Allergies   Aspirin and Peanuts  [peanut oil]   Review of Systems Review of Systems  Constitutional: Negative for fever.  HENT: Negative.   Respiratory: Negative.   Cardiovascular: Negative.   Gastrointestinal: Positive for abdominal pain, diarrhea, nausea and vomiting.  Neurological: Positive for dizziness.  All other systems reviewed and are negative.    Physical Exam Triage Vital Signs ED Triage Vitals [07/16/16 2011]  Enc Vitals Group     BP 125/86     Pulse Rate 115     Resp 16     Temp 99.8 F (37.7 C)     Temp Source Oral     SpO2 98 %     Weight      Height      Head Circumference      Peak Flow      Pain Score      Pain Loc      Pain Edu?      Excl. in [place]?    No data  found.   Updated Vital Signs BP 125/86 (BP Location: Left Arm)   Pulse 115   Temp 99.8 F (37.7 C) (Oral)   Resp 16   LMP 07/11/2016   SpO2 98%   Visual Acuity Right Eye Distance:   Left Eye Distance:   Bilateral Distance:    Right Eye Near:   Left Eye Near:    Bilateral Near:     Physical Exam  Constitutional: She appears well-developed and well-nourished. No distress.  HENT:  Mouth/Throat: Oropharynx is clear and moist.  Neck: Normal range of motion. Neck supple.  Abdominal: Soft. Normal appearance. She exhibits no mass. Bowel sounds are decreased. There is tenderness in the epigastric area. There is no rigidity, no rebound, no guarding, no CVA tenderness, no tenderness at McBurney's point and negative Murphy's sign. No hernia.    Lymphadenopathy:    She has no cervical adenopathy.  Nursing note and vitals reviewed.    UC Treatments / Results  Labs (all labs ordered are listed, but only abnormal results are displayed) Labs Reviewed  POCT I-STAT, CHEM 8 - Abnormal; Notable for the following:       Result Value   Sodium 133 (*)    Potassium 3.4 (*)    Chloride 96 (*)    BUN 4 (*)    Glucose, Bld 308 (*)    Calcium, Ion 1.10 (*)    Hemoglobin 15.3 (*)    All other components within normal limits  POCT PREGNANCY, URINE    EKG  EKG Interpretation None       Radiology No results found.  Procedures Procedures (including critical care time)  Medications Ordered in UC Medications  ondansetron (ZOFRAN-ODT) disintegrating tablet 8 mg (8 mg Oral Given 07/16/16 2056)     Initial Impression / Assessment and Plan / UC Course  I have reviewed the triage vital signs and the nursing notes.  Pertinent labs & imaging results that were available during my care of the patient were reviewed by me and considered in my medical decision making (see chart for details).  Clinical Course  sent for ivf, bs 308 with n/v/d since yest, sx continue    Final Clinical  Impressions(s) / UC Diagnoses   Final diagnoses:  Gastroenteritis    New Prescriptions Discharge Medication List as of 07/16/2016  9:01 PM       Billy Fischer, MD 07/16/16  2100    Billy Fischer, MD 07/16/16 2102

## 2016-07-16 NOTE — ED Notes (Signed)
Pt's family member came up asking wait time. Family stated the pt's nausea has gotten better since given the medication in Triage.

## 2016-07-17 ENCOUNTER — Emergency Department (HOSPITAL_COMMUNITY)
Admission: EM | Admit: 2016-07-17 | Discharge: 2016-07-17 | Disposition: A | Discharge status: Home / Self Care | Payer: BLUE CROSS/BLUE SHIELD | Attending: Dermatology | Admitting: Dermatology

## 2016-07-17 NOTE — ED Notes (Addendum)
Updated Pt on wait time. Pt stated she does not want to wait any longer and is going to leave. Pt stated the Med given in Triage did help her Nausea. Pt left.

## 2016-08-17 ENCOUNTER — Other Ambulatory Visit: Payer: Self-pay | Admitting: Orthopedic Surgery

## 2016-08-17 DIAGNOSIS — M25511 Pain in right shoulder: Secondary | ICD-10-CM

## 2016-08-29 ENCOUNTER — Ambulatory Visit
Admission: RE | Admit: 2016-08-29 | Discharge: 2016-08-29 | Disposition: A | Discharge status: Home / Self Care | Payer: BLUE CROSS/BLUE SHIELD | Source: Ambulatory Visit | Attending: Orthopedic Surgery | Admitting: Orthopedic Surgery

## 2016-08-29 DIAGNOSIS — M25511 Pain in right shoulder: Secondary | ICD-10-CM

## 2016-09-09 ENCOUNTER — Emergency Department (HOSPITAL_COMMUNITY)
Admission: EM | Admit: 2016-09-09 | Discharge: 2016-09-09 | Disposition: A | Discharge status: Home / Self Care | Payer: BLUE CROSS/BLUE SHIELD | Attending: Emergency Medicine | Admitting: Emergency Medicine

## 2016-09-09 ENCOUNTER — Encounter (HOSPITAL_COMMUNITY): Payer: Self-pay | Admitting: Emergency Medicine

## 2016-09-09 DIAGNOSIS — T25022A Burn of unspecified degree of left foot, initial encounter: Secondary | ICD-10-CM | POA: Insufficient documentation

## 2016-09-09 DIAGNOSIS — Z91010 Allergy to peanuts: Secondary | ICD-10-CM | POA: Diagnosis not present

## 2016-09-09 DIAGNOSIS — Y999 Unspecified external cause status: Secondary | ICD-10-CM | POA: Insufficient documentation

## 2016-09-09 DIAGNOSIS — Y929 Unspecified place or not applicable: Secondary | ICD-10-CM | POA: Insufficient documentation

## 2016-09-09 DIAGNOSIS — E1129 Type 2 diabetes mellitus with other diabetic kidney complication: Secondary | ICD-10-CM | POA: Insufficient documentation

## 2016-09-09 DIAGNOSIS — T300 Burn of unspecified body region, unspecified degree: Secondary | ICD-10-CM

## 2016-09-09 DIAGNOSIS — T2102XA Burn of unspecified degree of abdominal wall, initial encounter: Secondary | ICD-10-CM | POA: Insufficient documentation

## 2016-09-09 DIAGNOSIS — Y939 Activity, unspecified: Secondary | ICD-10-CM | POA: Diagnosis not present

## 2016-09-09 DIAGNOSIS — X100XXA Contact with hot drinks, initial encounter: Secondary | ICD-10-CM | POA: Insufficient documentation

## 2016-09-09 MED ORDER — OXYCODONE-ACETAMINOPHEN 5-325 MG PO TABS: 1.0000 | ORAL_TABLET | Freq: Four times a day (QID) | ORAL | 0 refills | Status: DC | PRN

## 2016-09-09 MED ORDER — BACITRACIN ZINC 500 UNIT/GM EX OINT
TOPICAL_OINTMENT | Freq: Two times a day (BID) | CUTANEOUS | Status: DC
  Administered 2016-09-09: 13:00:00 via TOPICAL

## 2016-09-09 MED ORDER — MORPHINE SULFATE (PF) 10 MG/ML IV SOLN
10.0000 mg | Freq: Once | INTRAVENOUS | Status: AC
  Administered 2016-09-09: 10 mg via INTRAVENOUS
  Filled 2016-09-09: qty 1

## 2016-09-09 NOTE — Discharge Instructions (Signed)
Take Tylenol for mild pain or the pain medicine prescribed for bad pain. Don't take Tylenol together with the pain medication prescribed as the combination can be dangerous to her liver. See your primary care physician tomorrow or the next day get your wounds rechecked in bandages changed. Ask your doctor to teach you change the bandages yourself.
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
## 2016-09-09 NOTE — ED Notes (Signed)
ED Provider at bedside. 
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
## 2016-09-11 ENCOUNTER — Encounter: Payer: Self-pay | Admitting: Family Medicine

## 2016-09-11 ENCOUNTER — Ambulatory Visit (INDEPENDENT_AMBULATORY_CARE_PROVIDER_SITE_OTHER): Payer: BLUE CROSS/BLUE SHIELD | Admitting: Family Medicine

## 2016-09-11 ENCOUNTER — Ambulatory Visit: Payer: BLUE CROSS/BLUE SHIELD | Admitting: Family Medicine

## 2016-09-11 VITALS — BP 128/86 | HR 120 | Temp 98.2°F | Resp 16 | Wt 310.0 lb

## 2016-09-11 DIAGNOSIS — T300 Burn of unspecified body region, unspecified degree: Secondary | ICD-10-CM

## 2016-09-11 MED ORDER — SILVER SULFADIAZINE 1 % EX CREA: 1.0000 "application " | TOPICAL_CREAM | Freq: Every day | CUTANEOUS | 0 refills | Status: DC
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
## 2016-09-21 ENCOUNTER — Ambulatory Visit (INDEPENDENT_AMBULATORY_CARE_PROVIDER_SITE_OTHER): Payer: BLUE CROSS/BLUE SHIELD | Admitting: Family Medicine

## 2016-09-21 ENCOUNTER — Ambulatory Visit: Payer: BLUE CROSS/BLUE SHIELD | Admitting: Family Medicine

## 2016-09-21 ENCOUNTER — Encounter: Payer: Self-pay | Admitting: Family Medicine

## 2016-09-21 DIAGNOSIS — R000 Tachycardia, unspecified: Secondary | ICD-10-CM | POA: Diagnosis not present

## 2016-09-21 DIAGNOSIS — E1129 Type 2 diabetes mellitus with other diabetic kidney complication: Secondary | ICD-10-CM

## 2016-09-21 DIAGNOSIS — T2122XD Burn of second degree of abdominal wall, subsequent encounter: Secondary | ICD-10-CM
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
## 2016-09-21 NOTE — Patient Instructions (Signed)
Keep area covered and clean Call with any problems

## 2016-09-30 DIAGNOSIS — R000 Tachycardia, unspecified: Secondary | ICD-10-CM | POA: Insufficient documentation

## 2016-09-30 DIAGNOSIS — T2122XA Burn of second degree of abdominal wall, initial encounter: Secondary | ICD-10-CM | POA: Insufficient documentation

## 2016-09-30 NOTE — Assessment & Plan Note (Signed)
I don't see a recent A1c, but wounds appear to be healing; uncontrolled diabetes can increase risk of infection

## 2016-09-30 NOTE — Assessment & Plan Note (Signed)
Ongoing for 18 months; noted by her usual provider; she has had EKGs done; I'll defer any additional work-up to her primary (TSH, CBC, referral to cardiologist, etc)

## 2016-09-30 NOTE — Assessment & Plan Note (Signed)
Burns appear to be healing appropriately, despite her diabetes; area of slough removed; continue current treatment; she is out of work until the 30th; keep already scheduled f/u appt with primary next week

## 2016-10-03 ENCOUNTER — Ambulatory Visit (INDEPENDENT_AMBULATORY_CARE_PROVIDER_SITE_OTHER): Payer: BLUE CROSS/BLUE SHIELD | Admitting: Family Medicine

## 2016-10-03 ENCOUNTER — Encounter: Payer: Self-pay | Admitting: Family Medicine

## 2016-10-03 VITALS — BP 130/68 | HR 108 | Temp 98.1°F | Resp 18 | Ht 67.0 in | Wt 310.4 lb

## 2016-10-03 DIAGNOSIS — E785 Hyperlipidemia, unspecified: Secondary | ICD-10-CM | POA: Diagnosis not present

## 2016-10-03 DIAGNOSIS — Z79899 Other long term (current) drug therapy: Secondary | ICD-10-CM

## 2016-10-03 DIAGNOSIS — Z23 Encounter for immunization: Secondary | ICD-10-CM | POA: Diagnosis not present

## 2016-10-03 DIAGNOSIS — K7581 Nonalcoholic steatohepatitis (NASH): Secondary | ICD-10-CM | POA: Diagnosis not present

## 2016-10-03 DIAGNOSIS — G40409 Other generalized epilepsy and epileptic syndromes, not intractable, without status epilepticus: Secondary | ICD-10-CM

## 2016-10-03 DIAGNOSIS — T300 Burn of unspecified body region, unspecified degree: Secondary | ICD-10-CM | POA: Diagnosis not present

## 2016-10-03 DIAGNOSIS — E1129 Type 2 diabetes mellitus with other diabetic kidney complication: Secondary | ICD-10-CM

## 2016-10-03 DIAGNOSIS — D72829 Elevated white blood cell count, unspecified: Secondary | ICD-10-CM

## 2016-10-03 DIAGNOSIS — G4731 Primary central sleep apnea: Secondary | ICD-10-CM

## 2016-10-03 DIAGNOSIS — E668 Other obesity: Secondary | ICD-10-CM

## 2016-10-03 LAB — CBC WITH DIFFERENTIAL/PLATELET
BASOS PCT: 0 %
Basophils Absolute: 0 cells/uL (ref 0–200)
EOS PCT: 0 %
Eosinophils Absolute: 0 cells/uL — ABNORMAL LOW (ref 15–500)
HEMATOCRIT: 39.9 % (ref 35.0–45.0)
HEMOGLOBIN: 13.3 g/dL (ref 11.7–15.5)
LYMPHS ABS: 3302 {cells}/uL (ref 850–3900)
Lymphocytes Relative: 26 %
MCH: 26.7 pg — ABNORMAL LOW (ref 27.0–33.0)
MCHC: 33.3 g/dL (ref 32.0–36.0)
MCV: 80.1 fL (ref 80.0–100.0)
MONO ABS: 635 {cells}/uL (ref 200–950)
MPV: 10.9 fL (ref 7.5–12.5)
Monocytes Relative: 5 %
NEUTROS ABS: 8763 {cells}/uL — AB (ref 1500–7800)
Neutrophils Relative %: 69 %
Platelets: 321 10*3/uL (ref 140–400)
RBC: 4.98 MIL/uL (ref 3.80–5.10)
RDW: 14.1 % (ref 11.0–15.0)
WBC: 12.7 10*3/uL — AB (ref 3.8–10.8)

## 2016-10-03 LAB — LIPID PANEL
CHOL/HDL RATIO: 5.6 ratio — AB (ref <5.0)
CHOLESTEROL: 214 mg/dL — AB (ref <200)
HDL: 38 mg/dL — ABNORMAL LOW (ref >50)
LDL Cholesterol: 149 mg/dL — ABNORMAL HIGH (ref <100)
Triglycerides: 133 mg/dL (ref <150)
VLDL: 27 mg/dL (ref <30)

## 2016-10-03 LAB — COMPLETE METABOLIC PANEL WITH GFR
ALBUMIN: 4.2 g/dL (ref 3.6–5.1)
ALK PHOS: 104 U/L (ref 33–115)
ALT: 24 U/L (ref 6–29)
AST: 18 U/L (ref 10–30)
BUN: 7 mg/dL (ref 7–25)
CALCIUM: 9.7 mg/dL (ref 8.6–10.2)
CO2: 23 mmol/L (ref 20–31)
CREATININE: 0.6 mg/dL (ref 0.50–1.10)
Chloride: 101 mmol/L (ref 98–110)
GFR, Est African American: >89 mL/min (ref >=60)
GFR, Est Non African American: >89 mL/min (ref >=60)
GLUCOSE: 267 mg/dL — AB (ref 65–99)
POTASSIUM: 4.1 mmol/L (ref 3.5–5.3)
SODIUM: 136 mmol/L (ref 135–146)
TOTAL PROTEIN: 7.4 g/dL (ref 6.1–8.1)
Total Bilirubin: 0.3 mg/dL (ref 0.2–1.2)

## 2016-10-03 LAB — POCT UA - MICROALBUMIN: MICROALBUMIN (UR) POC: 20 mg/L

## 2016-10-03 LAB — POCT GLYCOSYLATED HEMOGLOBIN (HGB A1C): HEMOGLOBIN A1C: 9.9

## 2016-10-03 LAB — VITAMIN B12: VITAMIN B 12: 379 pg/mL (ref 200–1100)

## 2016-10-03 MED ORDER — INSULIN PEN NEEDLE 30G X 8 MM MISC: 1.0000 | 2 refills | Status: DC | PRN

## 2016-10-03 MED ORDER — INSULIN DEGLUDEC-LIRAGLUTIDE 100-3.6 UNIT-MG/ML ~~LOC~~ SOPN: 16.0000 [IU] | PEN_INJECTOR | Freq: Every day | SUBCUTANEOUS | 2 refills | Status: DC

## 2016-10-03 MED ORDER — SILVER SULFADIAZINE 1 % EX CREA: 1.0000 "application " | TOPICAL_CREAM | Freq: Every day | CUTANEOUS | 0 refills | Status: DC

## 2016-10-03 NOTE — Progress Notes (Signed)
Name: Erin Good   MRN: [id number]    DOB: [birth year]   Date:10/03/2016       Progress Note  Subjective  Chief Complaint  Chief Complaint  Patient presents with  . Follow-up    3 week  . Burn    Bottom part of stomach is still having a hard time healing. Still has to wear surgical pads and cream to help reduce friction but everything she moves her wounds open back up and very painful.  . Diabetes    Checks sugar weekly and ran out of strips but sugar has been as low as 60 Average-134 High as 300's    HPI  DMII: she lost to follow up, not on medication for months, hgbA1C is out of control at 9.9%. Not checking glucose at home, not following a diabetic diet. She failed Bydureon, could not tolerate Metformin. She was on Victoza but glucose is very high we will try starting Xultophy. Denies polyphagia, polydipsia or polyuria. She has a history of microalbuminuria, but back to normal today, she wants to hold off on taking ARB/ACE  OSA: wearing CPAP every night and wakes feeling rested, no headaches  Seizure disorder: she sees Dr. Lorenda Hatchet, had one seizure this past Summer, she is doing well since. Taking medication as prescribed. She is on higher dose of medication since episode  Dyslipidemia: not currently on medication, we will recheck all her labs and resume if needed.    Patient Active Problem List   Diagnosis Date Noted  . Tachycardia 09/30/2016  . Partial thickness burn of abdomen 09/30/2016  . Cough, persistent 07/20/2015  . Depression with anxiety 05/06/2015  . Pelvic pain in female 05/06/2015  . Allergic rhinitis 04/20/2015  . Anxiety and depression 04/20/2015  . 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 Gastro-esophageal reflux disease without esophagitis 04/20/2015  . Dysmetabolic syndrome 32/67/1245  . Extreme obesity ([place]) 04/20/2015  . NASH (nonalcoholic steatohepatitis) 04/20/2015  . Allergy to nuts 04/20/2015  . Calculus of kidney 04/20/2015  .  Type 2 diabetes mellitus with renal manifestations ([place]) 04/20/2015  . Central sleep apnea 11/26/2008  . Dyslipidemia 07/02/2008  . Leukocytosis 07/29/2007    Past Surgical History:  Procedure Laterality Date  . CESAREAN SECTION     X 2  . RIGHT OOPHORECTOMY Right 2001   benign tumor  . TUBAL LIGATION  2007    Family History  Problem Relation Age of Onset  . Diabetes Mother   . Cancer Neg Hx   . Heart disease Neg Hx     Social History   Social History  . Marital status: Married    Spouse name: Roderic Palau  . Number of children: 2  . Years of education: College   Occupational History  . accountant    Social History Main Topics  . Smoking status: Never Smoker  . Smokeless tobacco: Never Used  . Alcohol use No  . Drug use: No  . Sexual activity: Yes    Birth control/ protection: Surgical   Other Topics Concern  . Not on file   Social History Narrative  . No narrative on file     Current Outpatient Prescriptions:  .  clonazePAM (KLONOPIN) 0.5 MG tablet, TK 1 T PO D PRF SEIZURES, Disp: , Rfl: 3 .  EPINEPHrine (EPIPEN 2-PAK) 0.3 mg/0.3 mL IJ SOAJ injection, Inject 1 pen as directed as needed., Disp: , Rfl:  .  glucose blood (ONETOUCH VERIO) test strip, 1 strip by Does not apply  route daily. Reported on 12/21/2015, Disp: , Rfl:  .  silver sulfADIAZINE (SILVADENE) 1 % cream, Apply 1 application topically daily., Disp: 1000 g, Rfl: 0 .  zonisamide (ZONEGRAN) 100 MG capsule, Take 500 mg by mouth at bedtime. , Disp: , Rfl:  .  Insulin Degludec-Liraglutide (XULTOPHY) 100-3.6 UNIT-MG/ML SOPN, Inject 16-50 Units into the skin daily., Disp: 9 mL, Rfl: 2 .  Insulin Pen Needle (NOVOFINE AUTOCOVER) 30G X 8 MM MISC, Inject 10 each into the skin as needed., Disp: 100 each, Rfl: 2  Allergies  Allergen Reactions  . Aspirin Other (See Comments)    Does take because of her epilepsy/seizure   . Peanuts  [Peanut Oil]      ROS  Constitutional: Negative for fever or weight change.   Respiratory: Negative for cough and shortness of breath.   Cardiovascular: Negative for chest pain or palpitations.  Gastrointestinal: Negative for abdominal pain, no bowel changes.  Musculoskeletal: Negative for gait problem or joint swelling.  Skin: positive for skin burn  Neurological: Negative for dizziness or headache.  No other specific complaints in a complete review of systems (except as listed in HPI above).  Objective  Vitals:   10/03/16 1053  BP: 130/68  Pulse: (!) 108  Resp: 18  Temp: 98.1 F (36.7 C)  TempSrc: Oral  SpO2: 96%  Weight: (!) 310 lb 6.4 oz (140.8 kg)  Height: 5' 7"  (1.702 m)    Body mass index is 48.62 kg/m.  Physical Exam  Constitutional: Patient appears well-developed and well-nourished. Obese No distress.  HEENT: head atraumatic, normocephalic, pupils equal and reactive to light,  neck supple, throat within normal limits Cardiovascular: Normal rate, regular rhythm and normal heart sounds.  No murmur heard. No BLE edema. Pulmonary/Chest: Effort normal and breath sounds normal. No respiratory distress. Abdominal: Soft.  There is no tenderness. Psychiatric: Patient has a normal mood and affect. behavior is normal. Judgment and thought content normal. Skin: healing skin burn, granulation tissue  Recent Results (from the past 2160 hour(s))  I-STAT, chem 8     Status: Abnormal   Collection Time: 07/16/16  8:39 PM  Result Value Ref Range   Sodium 133 (L) 135 - 145 mmol/L   Potassium 3.4 (L) 3.5 - 5.1 mmol/L   Chloride 96 (L) 101 - 111 mmol/L   BUN 4 (L) 6 - 20 mg/dL   Creatinine, Ser 0.50 0.44 - 1.00 mg/dL   Glucose, Bld 308 (H) 65 - 99 mg/dL   Calcium, Ion 1.10 (L) 1.15 - 1.40 mmol/L   TCO2 22 0 - 100 mmol/L   Hemoglobin 15.3 (H) 12.0 - 15.0 g/dL   HCT 45.0 36.0 - 46.0 %  Pregnancy, urine POC     Status: None   Collection Time: 07/16/16  8:47 PM  Result Value Ref Range   Preg Test, Ur NEGATIVE NEGATIVE    Comment:        THE SENSITIVITY  OF THIS METHODOLOGY IS >24 mIU/mL   POCT UA - Microalbumin     Status: None   Collection Time: 10/03/16 10:55 AM  Result Value Ref Range   Microalbumin Ur, POC 20 mg/L   Creatinine, POC  mg/dL   Albumin/Creatinine Ratio, Urine, POC    POCT HgB A1C     Status: Abnormal   Collection Time: 10/03/16 10:55 AM  Result Value Ref Range   Hemoglobin A1C 9.9     Diabetic Foot Exam: Diabetic Foot Exam - Simple   Simple Foot Form Diabetic  Foot exam was performed with the following findings:  Yes 10/03/2016 11:25 AM  Visual Inspection No deformities, no ulcerations, no other skin breakdown bilaterally:  Yes Sensation Testing Intact to touch and monofilament testing bilaterally:  Yes Pulse Check Posterior Tibialis and Dorsalis pulse intact bilaterally:  Yes Comments     PHQ2/9: Depression screen [facility] 2/9 09/21/2016 07/20/2015 05/06/2015  Decreased Interest 0 0 1  Down, Depressed, Hopeless 0 0 1  PHQ - 2 Score 0 0 2  Altered sleeping - - 3  Tired, decreased energy - - 3  Change in appetite - - 3  Feeling bad or failure about yourself  - - 0  Moving slowly or fidgety/restless - - 1  Suicidal thoughts - - 0  PHQ-9 Score - - 12    Fall Risk: Fall Risk  09/21/2016 07/20/2015 05/06/2015  Falls in the past year? No No Yes  Number falls in past yr: - - 1  Injury with Fall? - - No      Assessment & Plan  1. Type 2 diabetes mellitus with other diabetic kidney complication, without long-term current use of insulin (HCC)  - POCT UA - Microalbumin - POCT HgB A1C - Insulin Degludec-Liraglutide (XULTOPHY) 100-3.6 UNIT-MG/ML SOPN; Inject 16-50 Units into the skin daily.  Dispense: 9 mL; Refill: 2 - Insulin Pen Needle (NOVOFINE AUTOCOVER) 30G X 8 MM MISC; Inject 10 each into the skin as needed.  Dispense: 100 each; Refill: 2  2. Dyslipidemia  - Lipid panel  3. Skin burn  Healing slowly but healing well, only a few exposed areas on lower abdomen, still tender to touch, using  silvadene  4. Leukocytosis, unspecified type  - CBC with Differential/Platelet  5. Extreme obesity ([place])  Discussed life style modification   6. NASH (nonalcoholic steatohepatitis)  Recheck labs  7. 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 Lorenda Hatchet and no recent episodes  8. Central sleep apnea  Wearing CPAP every night  9. Long-term use of high-risk medication  - COMPLETE METABOLIC PANEL WITH GFR - VITAMIN D 25 Hydroxy (Vit-D Deficiency, Fractures) - Vitamin B12   10. Needs flu shot  Not given, refused

## 2016-10-03 NOTE — Patient Instructions (Signed)
Please titrate up the dose of Xultophy from 16-50 units by going up 2 units every 2 days to keep fasting sugar below 140

## 2016-10-04 ENCOUNTER — Other Ambulatory Visit: Payer: Self-pay | Admitting: Family Medicine

## 2016-10-04 LAB — VITAMIN D 25 HYDROXY (VIT D DEFICIENCY, FRACTURES): VIT D 25 HYDROXY: 10 ng/mL — AB (ref 30–100)

## 2016-10-04 MED ORDER — VITAMIN D (ERGOCALCIFEROL) 1.25 MG (50000 UNIT) PO CAPS: 50000.0000 [IU] | ORAL_CAPSULE | ORAL | 0 refills | Status: DC

## 2016-11-05 HISTORY — PX: BREAST BIOPSY: SHX20

## 2016-12-01 ENCOUNTER — Emergency Department (HOSPITAL_COMMUNITY): Payer: Self-pay

## 2016-12-01 ENCOUNTER — Emergency Department (HOSPITAL_COMMUNITY)
Admission: EM | Admit: 2016-12-01 | Discharge: 2016-12-01 | Disposition: A | Discharge status: Home / Self Care | Payer: Self-pay | Attending: Emergency Medicine | Admitting: Emergency Medicine

## 2016-12-01 ENCOUNTER — Encounter (HOSPITAL_COMMUNITY): Payer: Self-pay

## 2016-12-01 DIAGNOSIS — R29810 Facial weakness: Secondary | ICD-10-CM

## 2016-12-01 DIAGNOSIS — E119 Type 2 diabetes mellitus without complications: Secondary | ICD-10-CM | POA: Insufficient documentation

## 2016-12-01 DIAGNOSIS — G8191 Hemiplegia, unspecified affecting right dominant side: Secondary | ICD-10-CM

## 2016-12-01 DIAGNOSIS — R200 Anesthesia of skin: Secondary | ICD-10-CM | POA: Insufficient documentation

## 2016-12-01 DIAGNOSIS — G40909 Epilepsy, unspecified, not intractable, without status epilepticus: Secondary | ICD-10-CM | POA: Insufficient documentation

## 2016-12-01 DIAGNOSIS — Z794 Long term (current) use of insulin: Secondary | ICD-10-CM | POA: Insufficient documentation

## 2016-12-01 DIAGNOSIS — G510 Bell's palsy: Secondary | ICD-10-CM

## 2016-12-01 DIAGNOSIS — Z79899 Other long term (current) drug therapy: Secondary | ICD-10-CM | POA: Insufficient documentation

## 2016-12-01 DIAGNOSIS — Q670 Congenital facial asymmetry: Secondary | ICD-10-CM

## 2016-12-01 LAB — DIFFERENTIAL
BASOS ABS: 0 10*3/uL (ref 0.0–0.1)
BASOS PCT: 0 %
EOS ABS: 0 10*3/uL (ref 0.0–0.7)
EOS PCT: 0 %
Lymphocytes Relative: 27 %
Lymphs Abs: 4.1 10*3/uL — ABNORMAL HIGH (ref 0.7–4.0)
MONOS PCT: 5 %
Monocytes Absolute: 0.8 10*3/uL (ref 0.1–1.0)
Neutro Abs: 10.3 10*3/uL — ABNORMAL HIGH (ref 1.7–7.7)
Neutrophils Relative %: 68 %

## 2016-12-01 LAB — COMPREHENSIVE METABOLIC PANEL
ALT: 30 U/L (ref 14–54)
ANION GAP: 9 (ref 5–15)
AST: 25 U/L (ref 15–41)
Albumin: 4 g/dL (ref 3.5–5.0)
Alkaline Phosphatase: 102 U/L (ref 38–126)
BUN: 8 mg/dL (ref 6–20)
CHLORIDE: 104 mmol/L (ref 101–111)
CO2: 24 mmol/L (ref 22–32)
Calcium: 9.5 mg/dL (ref 8.9–10.3)
Creatinine, Ser: 0.59 mg/dL (ref 0.44–1.00)
Glucose, Bld: 195 mg/dL — ABNORMAL HIGH (ref 65–99)
POTASSIUM: 3.5 mmol/L (ref 3.5–5.1)
Sodium: 137 mmol/L (ref 135–145)
Total Bilirubin: 0.3 mg/dL (ref 0.3–1.2)
Total Protein: 7.4 g/dL (ref 6.5–8.1)

## 2016-12-01 LAB — I-STAT CHEM 8, ED
BUN: 6 mg/dL (ref 6–20)
CHLORIDE: 104 mmol/L (ref 101–111)
Calcium, Ion: 1.28 mmol/L (ref 1.15–1.40)
Creatinine, Ser: 0.6 mg/dL (ref 0.44–1.00)
GLUCOSE: 196 mg/dL — AB (ref 65–99)
HEMATOCRIT: 40 % (ref 36.0–46.0)
HEMOGLOBIN: 13.6 g/dL (ref 12.0–15.0)
POTASSIUM: 3.6 mmol/L (ref 3.5–5.1)
SODIUM: 141 mmol/L (ref 135–145)
TCO2: 26 mmol/L (ref 0–100)

## 2016-12-01 LAB — I-STAT TROPONIN, ED: TROPONIN I, POC: 0 ng/mL (ref 0.00–0.08)

## 2016-12-01 LAB — CBC
HEMATOCRIT: 39.3 % (ref 36.0–46.0)
Hemoglobin: 12.8 g/dL (ref 12.0–15.0)
MCH: 26 pg (ref 26.0–34.0)
MCHC: 32.6 g/dL (ref 30.0–36.0)
MCV: 79.7 fL (ref 78.0–100.0)
Platelets: 321 10*3/uL (ref 150–400)
RBC: 4.93 MIL/uL (ref 3.87–5.11)
RDW: 13.3 % (ref 11.5–15.5)
WBC: 15.3 10*3/uL — ABNORMAL HIGH (ref 4.0–10.5)

## 2016-12-01 LAB — PROTIME-INR
INR: 1.02
Prothrombin Time: 13.4 seconds (ref 11.4–15.2)

## 2016-12-01 LAB — APTT: APTT: 31 s (ref 24–36)

## 2016-12-01 LAB — CBG MONITORING, ED: GLUCOSE-CAPILLARY: 172 mg/dL — AB (ref 65–99)

## 2016-12-01 MED ORDER — VALACYCLOVIR HCL 500 MG PO TABS
1000.0000 mg | ORAL_TABLET | Freq: Once | ORAL | Status: AC
  Administered 2016-12-01: 1000 mg via ORAL
  Filled 2016-12-01: qty 2

## 2016-12-01 MED ORDER — ARTIFICIAL TEARS OP OINT: TOPICAL_OINTMENT | OPHTHALMIC | 0 refills | Status: DC | PRN

## 2016-12-01 MED ORDER — VALACYCLOVIR HCL 1 G PO TABS: 1000.0000 mg | ORAL_TABLET | Freq: Three times a day (TID) | ORAL | 0 refills | Status: AC

## 2016-12-01 MED ORDER — PREDNISONE 20 MG PO TABS
60.0000 mg | ORAL_TABLET | Freq: Once | ORAL | Status: AC
  Administered 2016-12-01: 60 mg via ORAL
  Filled 2016-12-01: qty 3

## 2016-12-01 MED ORDER — PREDNISONE 20 MG PO TABS: 60.0000 mg | ORAL_TABLET | Freq: Every day | ORAL | 0 refills | Status: DC

## 2016-12-01 NOTE — Consult Note (Signed)
Neurology Consult Note  Reason for Consultation: Possible stroke  Requesting provider: Lita Mains, MD  CC: R sided weakness  HPI: This is a 41 year old right-handed woman who initially presented [facility] emergency department for evaluation of right facial droop. He is obtained directly from the patient was an excellent historian.  The patient reports that she was in her normal state of health until this afternoon at about 1:00. She remembers trying to drink from a straw and noticed that she was having difficulty sealing her lips around the straw. She also noticed some numbness on the right side of her tongue. Her son noted that the right side of her face appeared to be drooping. Subsequently, she noted that she was having some weakness of the right arm and leg as well. She denies any other numbness or tingling apart from what she experienced on the right side of her tongue. She denies any change in vision or hearing. She did not have any difficulty talking or swallowing. She does not have any changes in balance or gait. She reports symptoms are unchanged since their onset. She has never had any similar symptoms before.  Code stroke was initially activated in the was a long emergency department. Emergency CT scan of the head was obtained and was unremarkable. I spoke with the ED physician, Dr. Lita Mains. He felt that the weakness in her extremities was somewhat unusual, and already spoken with the [facility] emergency department physician about sending the patient over for MRI scan of the brain. She now arises [facility], again symptoms unchanged.  PMH:  Past Medical History:  Diagnosis Date  . Allergic rhinitis   . Diabetes mellitus without complication ([place])   . Dyslipidemia   . Epilepsy ([place])   . Epilepsy ([place])   . Galactorrhea   . Hx gestational diabetes   . Kidney stones   . Obesity   . Seizures ([place])   . Sleep apnea   . Syncope and collapse   . Tachycardia     PSH:  Past  Surgical History:  Procedure Laterality Date  . CESAREAN SECTION     X 2  . RIGHT OOPHORECTOMY Right 2001   benign tumor  . TUBAL LIGATION  2007    Family history: Family History  Problem Relation Age of Onset  . Diabetes Mother   . Cancer Neg Hx   . Heart disease Neg Hx     Social history:  Social History   Social History  . Marital status: Married    Spouse name: Roderic Palau  . Number of children: 2  . Years of education: College   Occupational History  . accountant    Social History Main Topics  . Smoking status: Never Smoker  . Smokeless tobacco: Never Used  . Alcohol use No  . Drug use: No  . Sexual activity: Yes    Birth control/ protection: Surgical   Other Topics Concern  . Not on file   Social History Narrative  . No narrative on file    Current outpatient meds: No outpatient prescriptions have been marked as taking for the 12/01/16 encounter [facility] Encounter).    Current inpatient meds:  No current facility-administered medications for this encounter.    Current Outpatient Prescriptions  Medication Sig Dispense Refill  . clonazePAM (KLONOPIN) 0.5 MG tablet TK 1 T PO D PRF SEIZURES  3  . EPINEPHrine (EPIPEN 2-PAK) 0.3 mg/0.3 mL IJ SOAJ injection Inject 1 pen as directed as needed.    Marland Kitchen  glucose blood (ONETOUCH VERIO) test strip 1 strip by Does not apply route daily. Reported on 12/21/2015    . Insulin Degludec-Liraglutide (XULTOPHY) 100-3.6 UNIT-MG/ML SOPN Inject 16-50 Units into the skin daily. 9 mL 2  . Insulin Pen Needle (NOVOFINE AUTOCOVER) 30G X 8 MM MISC Inject 10 each into the skin as needed. 100 each 2  . silver sulfADIAZINE (SILVADENE) 1 % cream Apply 1 application topically daily. 1000 g 0  . Vitamin D, Ergocalciferol, (DRISDOL) 50000 units CAPS capsule Take 1 capsule (50,000 Units total) by mouth every 7 (seven) days. 12 capsule 0  . zonisamide (ZONEGRAN) 100 MG capsule Take 500 mg by mouth at bedtime.       Allergies: Allergies   Allergen Reactions  . Peanuts [Peanut Oil] Anaphylaxis  . Aspirin Other (See Comments)    Does take because of her epilepsy/seizure     ROS: As per HPI. A full 14-point review of systems was performed and is otherwise unremarkable except for a headache last night. She reports he has a history of headaches and this was no different than usual. She also has some chronic pain in the right shoulder which began after she fell during a seizure several months ago.  PE:  BP 123/87   Pulse 92   Resp 16   Ht 5' 7"  (1.702 m)   Wt (!) 142.9 kg (315 lb)   LMP 11/12/2016   SpO2 99%   BMI 49.34 kg/m   General: WDWN, no acute distress. AAO x4. Speech clear, no dysarthria. No aphasia. Follows commands briskly. Affect is bright with congruent mood. Comportment is normal.  HEENT: Normocephalic. Neck supple without LAD. MMM, OP clear. Dentition good. Sclerae anicteric. No conjunctival injection.  CV: Regular, no murmur. Carotid pulses full and symmetric, no bruits. Distal pulses 2+ and symmetric.  Lungs: CTAB.  Abdomen: Soft, obese, non-distended, non-tender. Bowel sounds present x4.  Extremities: No C/C/E. Neuro:  CN: Pupils are equal and round. They are symmetrically reactive from 3-->2 mm. visual fields are full to confrontation. EOMI without nystagmus. No reported diplopia. Facial sensation is intact to light touch. She has mild weakness of the right half of her face, including the forehead. This is somewhat inconsistent in that that she has absent Bell's phenomenon sometimes while at other times Bell's phenomenon is intact. Hearing is intact to conversational voice. Palate elevates symmetrically and uvula is midline. Voice is normal in tone, pitch and quality. Bilateral SCM and trapezii are 5/5. Tongue is midline with normal bulk and mobility.  Motor: Normal bulk, tone, and strength. She has prominent give way weakness on the right side. She has a positive Hoover sign on the right. Her strength exam  changes when the right side as tested in isolation versus when both sides are tested simultaneously. No tremor or other abnormal movements. No drift.  Sensation: Intact to light touch.  DTRs: 2+, symmetric. Toes downgoing bilaterally. No pathologic reflexes.  Coordination: Finger-to-nose is without dysmetria. Finger taps are normal in amplitude and speed, no decrement.    Labs:  Lab Results  Component Value Date   WBC 15.3 (H) 12/01/2016   HGB 13.6 12/01/2016   HCT 40.0 12/01/2016   PLT 321 12/01/2016   GLUCOSE 196 (H) 12/01/2016   CHOL 214 (H) 10/03/2016   TRIG 133 10/03/2016   HDL 38 (L) 10/03/2016   LDLCALC 149 (H) 10/03/2016   ALT 30 12/01/2016   AST 25 12/01/2016   NA 141 12/01/2016   K 3.6 12/01/2016  CL 104 12/01/2016   CREATININE 0.60 12/01/2016   BUN 6 12/01/2016   CO2 24 12/01/2016   TSH 1.070 07/28/2015   INR 1.02 12/01/2016   HGBA1C 9.9 10/03/2016   MICROALBUR 20 10/03/2016    Imaging:  I have personally and independently reviewed CT scan of the head without contrast from today. This is unremarkable.  Assessment and Plan:  1. Right facial droop: On examination, she has what appears to be a peripheral pattern of weakness on the right side of her face. This may be somewhat effort dependent as sometimes she has absent Bell's phenomenon when her strength is being assessed. It is possible that this is somatoform, though I cannot completely exclude possible Bell's palsy. Stroke seems unlikely given this pattern of weakness, particularly when combined with nonphysiologic weakness of the right extremities. MRI scan is pending to further assess.  2. Weakness of the right extremities: This is nonphysiologic in nature with prominent give way, inconsistent weakness with bilateral testing, positive Hoover's, and contraction of antagonist muscles during confrontational testing. Stroke seems extremely unlikely.

## 2016-12-01 NOTE — ED Provider Notes (Signed)
Patient received in transfer from [facility] ED for further evaluation of facial droop and right-sided weakness. Symptom onset at 1 PM today when patient had difficulty drinking from a straw. Patient was evaluated at [facility] with labs and CT which did not show any acute findings. She was transferred here for further evaluation.  On exam here, patient does have right facial droop with forehead involvement. She is unable to completely close her right eye.  Her speech is clear but states tongue feels numb on right side. She reports some right-sided weakness and feelings of discoordination in her right arm/leg but I do not appreciate this on exam.  No hx of TIA or CVA in the past.  Prior labs and CT head reassuring.  Plan:  Will obtain MRI to r/o acute stroke.  Results for orders placed or performed during the hospital encounter of 12/01/16  Protime-INR  Result Value Ref Range   Prothrombin Time 13.4 11.4 - 15.2 seconds   INR 1.02   APTT  Result Value Ref Range   aPTT 31 24 - 36 seconds  CBC  Result Value Ref Range   WBC 15.3 (H) 4.0 - 10.5 K/uL   RBC 4.93 3.87 - 5.11 MIL/uL   Hemoglobin 12.8 12.0 - 15.0 g/dL   HCT 39.3 36.0 - 46.0 %   MCV 79.7 78.0 - 100.0 fL   MCH 26.0 26.0 - 34.0 pg   MCHC 32.6 30.0 - 36.0 g/dL   RDW 13.3 11.5 - 15.5 %   Platelets 321 150 - 400 K/uL  Differential  Result Value Ref Range   Neutrophils Relative % 68 %   Neutro Abs 10.3 (H) 1.7 - 7.7 K/uL   Lymphocytes Relative 27 %   Lymphs Abs 4.1 (H) 0.7 - 4.0 K/uL   Monocytes Relative 5 %   Monocytes Absolute 0.8 0.1 - 1.0 K/uL   Eosinophils Relative 0 %   Eosinophils Absolute 0.0 0.0 - 0.7 K/uL   Basophils Relative 0 %   Basophils Absolute 0.0 0.0 - 0.1 K/uL  Comprehensive metabolic panel  Result Value Ref Range   Sodium 137 135 - 145 mmol/L   Potassium 3.5 3.5 - 5.1 mmol/L   Chloride 104 101 - 111 mmol/L   CO2 24 22 - 32 mmol/L   Glucose, Bld 195 (H) 65 - 99 mg/dL   BUN 8 6 - 20 mg/dL   Creatinine,  Ser 0.59 0.44 - 1.00 mg/dL   Calcium 9.5 8.9 - 10.3 mg/dL   Total Protein 7.4 6.5 - 8.1 g/dL   Albumin 4.0 3.5 - 5.0 g/dL   AST 25 15 - 41 U/L   ALT 30 14 - 54 U/L   Alkaline Phosphatase 102 38 - 126 U/L   Total Bilirubin 0.3 0.3 - 1.2 mg/dL   GFR calc non Af Amer >60 >60 mL/min   GFR calc Af Amer >60 >60 mL/min   Anion gap 9 5 - 15  I-stat troponin, ED  Result Value Ref Range   Troponin i, poc 0.00 0.00 - 0.08 ng/mL   Comment 3          CBG monitoring, ED  Result Value Ref Range   Glucose-Capillary 172 (H) 65 - 99 mg/dL  I-Stat Chem 8, ED  Result Value Ref Range   Sodium 141 135 - 145 mmol/L   Potassium 3.6 3.5 - 5.1 mmol/L   Chloride 104 101 - 111 mmol/L   BUN 6 6 - 20 mg/dL  Creatinine, Ser 0.60 0.44 - 1.00 mg/dL   Glucose, Bld 196 (H) 65 - 99 mg/dL   Calcium, Ion 1.28 1.15 - 1.40 mmol/L   TCO2 26 0 - 100 mmol/L   Hemoglobin 13.6 12.0 - 15.0 g/dL   HCT 40.0 36.0 - 46.0 %   Mr Brain Wo Contrast  Result Date: 12/01/2016 CLINICAL DATA:  Right-sided facial droop.  Right-sided weakness. EXAM: MRI HEAD WITHOUT CONTRAST TECHNIQUE: Multiplanar, multiecho pulse sequences of the brain and surrounding structures were obtained without intravenous contrast. COMPARISON:  Head CT from earlier today FINDINGS: Brain: No acute infarction, hemorrhage, hydrocephalus, extra-axial collection or mass lesion. Partially empty sella, incidental in this setting. Vascular: Normal flow voids. Skull and upper cervical spine: No marrow lesion. Sinuses/Orbits: Negative Other: Prominence of probable lymph node at the left jugulodigastric station, also seen on cervical spine CT from 2008. Multiple lymph nodes have a hyperintense appearance on coronal T2 weighted imaging, but have normal size and T1 weighted appearance on sagittal T1 weighted imaging. IMPRESSION: Normal appearance of the brain.  Negative for acute infarct. Electronically Signed   By: Monte Fantasia M.D.   On: 12/01/2016 19:53   Ct Head Code  Stroke W/o Cm  Result Date: 12/01/2016 CLINICAL DATA:  Code stroke. Right-sided facial constriction and right upper extremity weakness. EXAM: CT HEAD WITHOUT CONTRAST TECHNIQUE: Contiguous axial images were obtained from the base of the skull through the vertex without intravenous contrast. COMPARISON:  01/15/2007 FINDINGS: Brain: No evidence of acute infarction, hemorrhage, hydrocephalus, extra-axial collection or mass lesion/mass effect. Vascular: No hyperdense vessel or unexpected calcification. Skull: Normal. Negative for fracture or focal lesion. Sinuses/Orbits: Negative Other: These results were called by telephone at the time of interpretation on 12/01/2016 at 5:24 pm to Dr. Julianne Rice , who verbally acknowledged these results. ASPECTS [facility] Stroke Program Early CT Score) - Ganglionic level infarction (caudate, lentiform nuclei, internal capsule, insula, M1-M3 cortex): 7 - Supraganglionic infarction (M4-M6 cortex): 3 Total score (0-10 with 10 being normal): 10 IMPRESSION: Negative exam.ASPECTS is 10. Electronically Signed   By: Monte Fantasia M.D.   On: 12/01/2016 17:24   MRI negative for acute findings.  Suspect Bell's Palsy.  Will treat with prednisone, acyclovir, lacrilube, and eye patch.  Patient encouraged to monitor her glucose closely at home while taking prednisone as this can elevate it. She has neurologist that she will follow-up with ASAP.  Discussed plan with patient, she acknowledged understanding and agreed with plan of care.  Return precautions given for new or worsening symptoms.   Larene Pickett, PA-C 12/01/16 [id number]    Tanna Furry, MD 12/13/16 [phone]

## 2016-12-01 NOTE — Discharge Instructions (Signed)
As we discussed, your MRI today did not show a stroke.  We believe you have bell's palsy.  See attached information about this. Take the prescribed medication as directed.  Make sure to keep your eyes lubricated well and protect with eye patch. Follow-up with your neurologist as soon as possible.  Would also recommend that you notify your primary care doctor of this ED visit. Return to the ED for new or worsening symptoms.

## 2016-12-01 NOTE — ED Triage Notes (Signed)
Pt c/o R side facial constriction and RUE weakness starting around 1300 and R shoulder pain "since I fell this summer."  Pain score 8/10.  Facial asymmetry noted.  Pt unable to completely close R eye.  Slight weakness noted in R grip upon assessment.  Sts numbness in tongue.

## 2016-12-01 NOTE — ED Notes (Signed)
Code stroke called @ [phone]
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
## 2016-12-06 DIAGNOSIS — N63 Unspecified lump in unspecified breast: Secondary | ICD-10-CM

## 2016-12-06 HISTORY — DX: Unspecified lump in unspecified breast: N63.0

## 2016-12-19 ENCOUNTER — Ambulatory Visit (INDEPENDENT_AMBULATORY_CARE_PROVIDER_SITE_OTHER): Payer: 59 | Admitting: Family Medicine

## 2016-12-19 ENCOUNTER — Encounter: Payer: Self-pay | Admitting: Family Medicine

## 2016-12-19 VITALS — BP 118/78 | HR 123 | Temp 98.5°F | Resp 18 | Ht 67.0 in | Wt 310.0 lb

## 2016-12-19 DIAGNOSIS — E1129 Type 2 diabetes mellitus with other diabetic kidney complication: Secondary | ICD-10-CM

## 2016-12-19 DIAGNOSIS — Z09 Encounter for follow-up examination after completed treatment for conditions other than malignant neoplasm: Secondary | ICD-10-CM

## 2016-12-19 DIAGNOSIS — G510 Bell's palsy: Secondary | ICD-10-CM | POA: Insufficient documentation

## 2016-12-19 DIAGNOSIS — D72829 Elevated white blood cell count, unspecified: Secondary | ICD-10-CM | POA: Diagnosis not present

## 2016-12-19 DIAGNOSIS — E785 Hyperlipidemia, unspecified: Secondary | ICD-10-CM | POA: Diagnosis not present

## 2016-12-19 DIAGNOSIS — E1169 Type 2 diabetes mellitus with other specified complication: Secondary | ICD-10-CM | POA: Diagnosis not present
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
## 2017-01-02 DIAGNOSIS — G4733 Obstructive sleep apnea (adult) (pediatric): Secondary | ICD-10-CM | POA: Diagnosis not present

## 2017-01-05 DIAGNOSIS — M9902 Segmental and somatic dysfunction of thoracic region: Secondary | ICD-10-CM | POA: Diagnosis not present

## 2017-01-05 DIAGNOSIS — M9907 Segmental and somatic dysfunction of upper extremity: Secondary | ICD-10-CM | POA: Diagnosis not present

## 2017-01-05 DIAGNOSIS — M9901 Segmental and somatic dysfunction of cervical region: Secondary | ICD-10-CM | POA: Diagnosis not present

## 2017-01-28 ENCOUNTER — Ambulatory Visit: Payer: 59 | Admitting: Family Medicine

## 2017-01-28 IMAGING — CR DG CHEST 2V
2 series · 2 of 2 positions shown · non-contrast
Comparison: 04/09/2013

CLINICAL DATA: Right-sided chest pain and dyspnea.

EXAM:
CHEST  2 VIEW

[w chest pa]
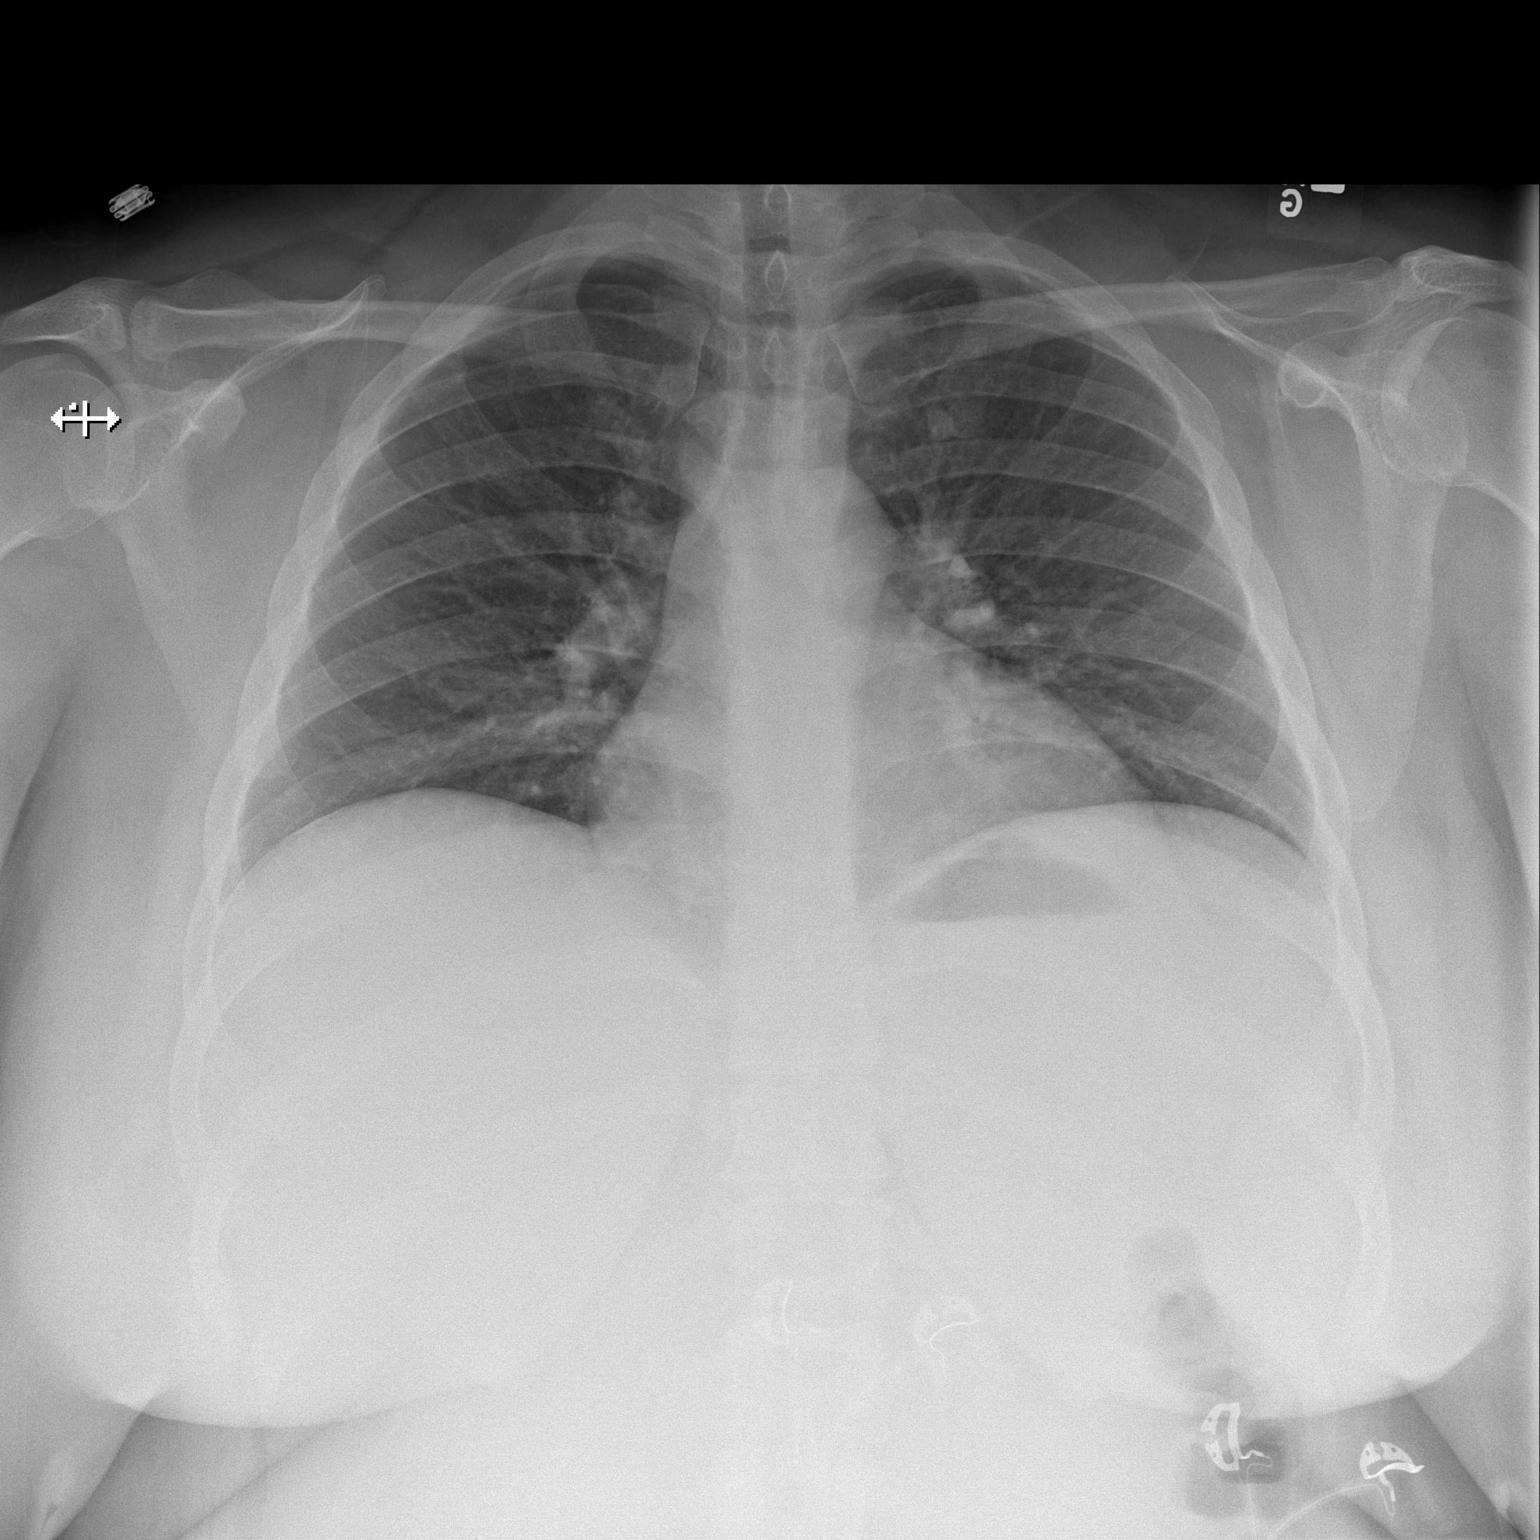

[w chest lat]
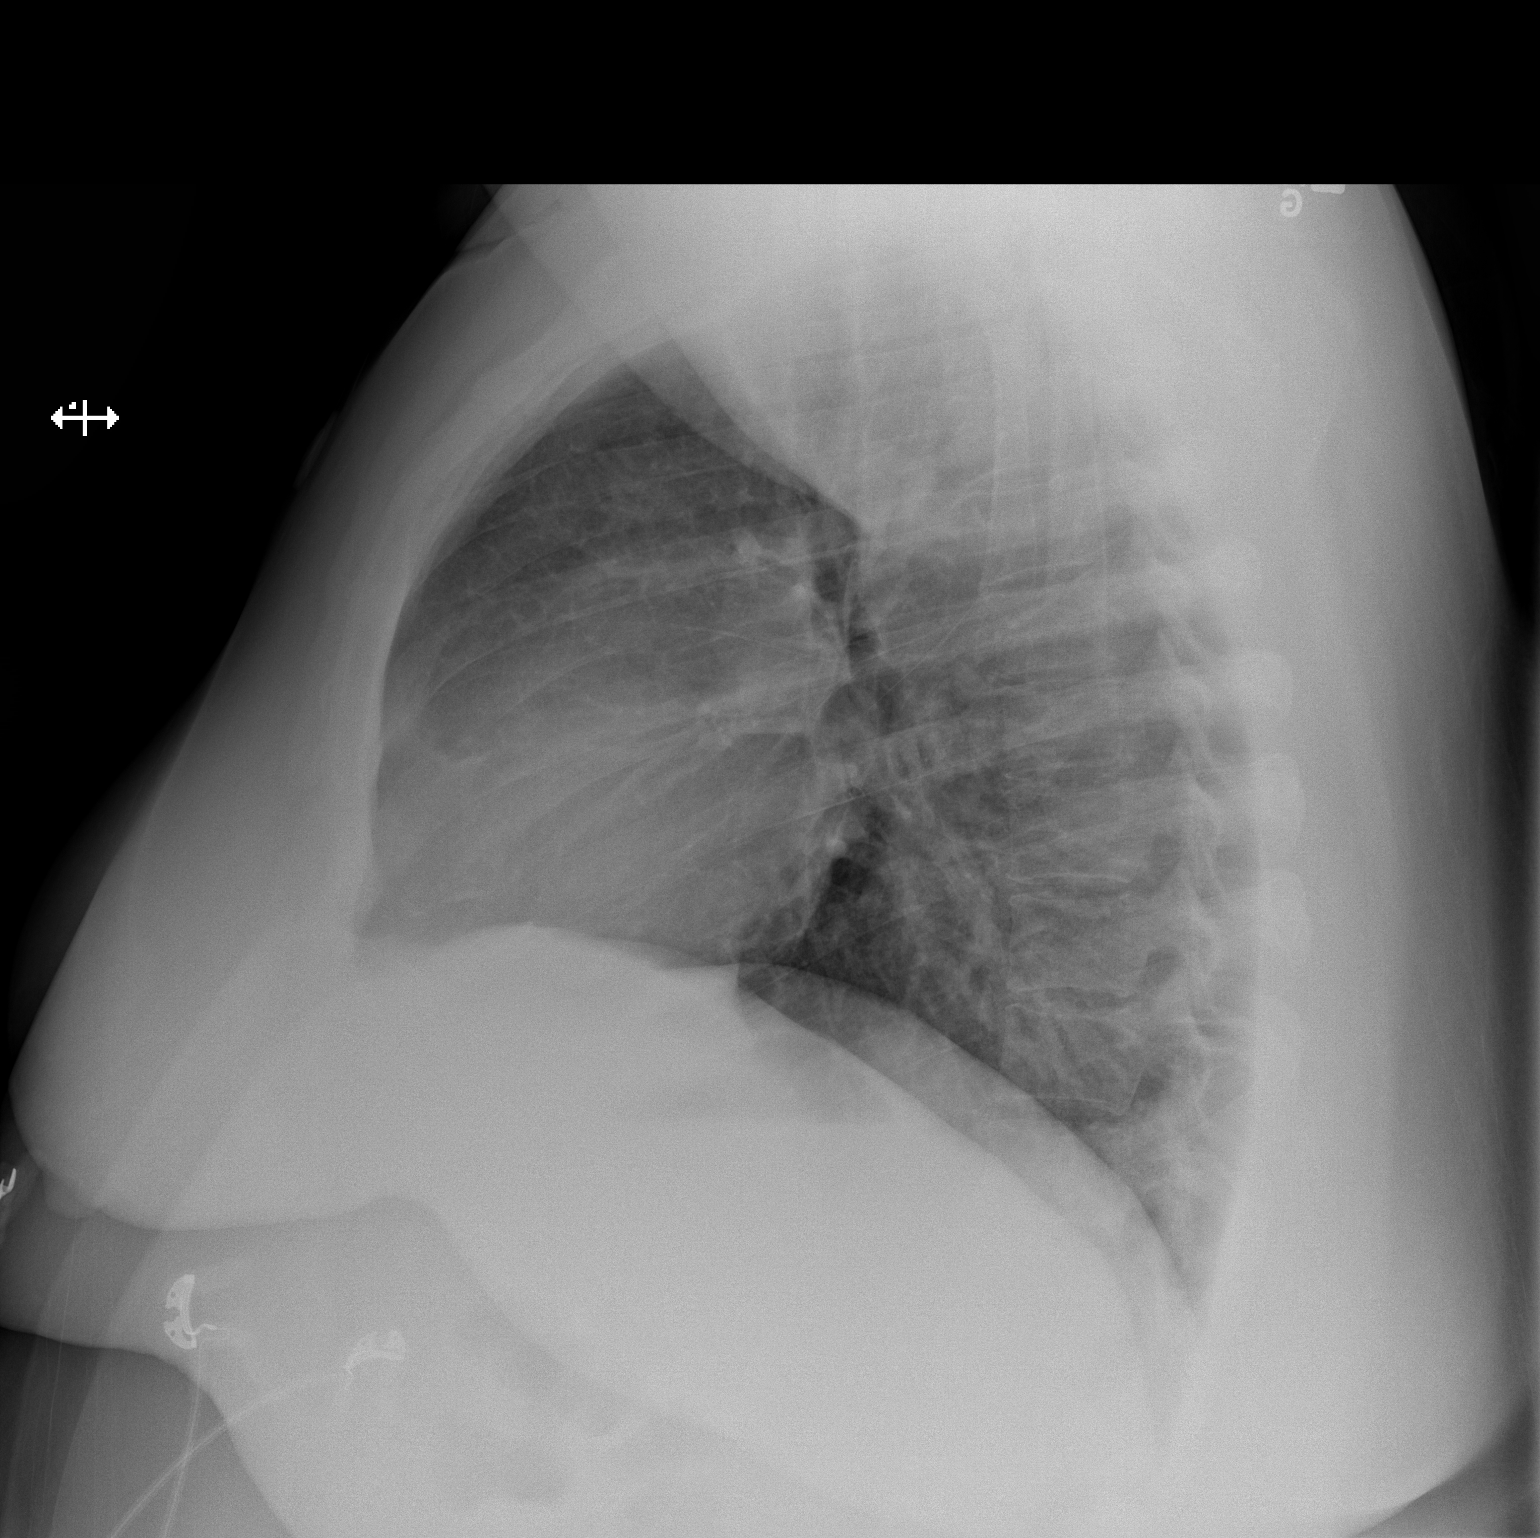

[2 of 2 positions shown; findings below may reference images not displayed]

FINDINGS: There is a shallow inspiration. The lungs are clear. The pulmonary
vasculature is normal. There are no pleural effusions. [place],
mediastinal and cardiac contours are unremarkable and unchanged.
IMPRESSION: No active cardiopulmonary disease.

## 2017-01-31 ENCOUNTER — Encounter: Payer: Self-pay | Admitting: Family Medicine

## 2017-01-31 ENCOUNTER — Ambulatory Visit (INDEPENDENT_AMBULATORY_CARE_PROVIDER_SITE_OTHER): Payer: 59 | Admitting: Family Medicine

## 2017-01-31 VITALS — BP 124/68 | HR 110 | Temp 98.2°F | Resp 18 | Ht 67.0 in | Wt 314.7 lb

## 2017-01-31 DIAGNOSIS — D72829 Elevated white blood cell count, unspecified: Secondary | ICD-10-CM | POA: Diagnosis not present

## 2017-01-31 DIAGNOSIS — E785 Hyperlipidemia, unspecified: Secondary | ICD-10-CM

## 2017-01-31 DIAGNOSIS — Z79899 Other long term (current) drug therapy: Secondary | ICD-10-CM | POA: Diagnosis not present

## 2017-01-31 DIAGNOSIS — E1129 Type 2 diabetes mellitus with other diabetic kidney complication: Secondary | ICD-10-CM

## 2017-01-31 DIAGNOSIS — K7581 Nonalcoholic steatohepatitis (NASH): Secondary | ICD-10-CM

## 2017-01-31 DIAGNOSIS — E559 Vitamin D deficiency, unspecified: Secondary | ICD-10-CM

## 2017-01-31 DIAGNOSIS — E1169 Type 2 diabetes mellitus with other specified complication: Secondary | ICD-10-CM | POA: Diagnosis not present

## 2017-01-31 DIAGNOSIS — M778 Other enthesopathies, not elsewhere classified: Secondary | ICD-10-CM | POA: Insufficient documentation

## 2017-01-31 DIAGNOSIS — G4731 Primary central sleep apnea: Secondary | ICD-10-CM | POA: Diagnosis not present

## 2017-01-31 DIAGNOSIS — E668 Other obesity: Secondary | ICD-10-CM

## 2017-01-31 DIAGNOSIS — M6780 Other specified disorders of synovium and tendon, unspecified site: Secondary | ICD-10-CM | POA: Insufficient documentation

## 2017-01-31 DIAGNOSIS — M7581 Other shoulder lesions, right shoulder: Secondary | ICD-10-CM

## 2017-01-31 DIAGNOSIS — E669 Obesity, unspecified: Secondary | ICD-10-CM

## 2017-01-31 LAB — COMPLETE METABOLIC PANEL WITH GFR
ALT: 16 U/L (ref 6–29)
AST: 10 U/L (ref 10–30)
Albumin: 3.9 g/dL (ref 3.6–5.1)
Alkaline Phosphatase: 107 U/L (ref 33–115)
BUN: 9 mg/dL (ref 7–25)
CO2: 20 mmol/L (ref 20–31)
Calcium: 9.3 mg/dL (ref 8.6–10.2)
Chloride: 103 mmol/L (ref 98–110)
Creat: 0.65 mg/dL (ref 0.50–1.10)
GFR, Est African American: >89 mL/min (ref >=60)
GFR, Est Non African American: >89 mL/min (ref >=60)
GLUCOSE: 313 mg/dL — AB (ref 65–99)
POTASSIUM: 4.4 mmol/L (ref 3.5–5.3)
SODIUM: 137 mmol/L (ref 135–146)
Total Bilirubin: 0.3 mg/dL (ref 0.2–1.2)
Total Protein: 7.2 g/dL (ref 6.1–8.1)

## 2017-01-31 LAB — CBC WITH DIFFERENTIAL/PLATELET
BASOS ABS: 0 {cells}/uL (ref 0–200)
Basophils Relative: 0 %
EOS ABS: 0 {cells}/uL — AB (ref 15–500)
Eosinophils Relative: 0 %
HEMATOCRIT: 39.8 % (ref 35.0–45.0)
HEMOGLOBIN: 12.7 g/dL (ref 11.7–15.5)
LYMPHS ABS: 2688 {cells}/uL (ref 850–3900)
Lymphocytes Relative: 24 %
MCH: 25.9 pg — ABNORMAL LOW (ref 27.0–33.0)
MCHC: 31.9 g/dL — AB (ref 32.0–36.0)
MCV: 81.1 fL (ref 80.0–100.0)
MONO ABS: 560 {cells}/uL (ref 200–950)
MPV: 10.7 fL (ref 7.5–12.5)
Monocytes Relative: 5 %
NEUTROS PCT: 71 %
Neutro Abs: 7952 cells/uL — ABNORMAL HIGH (ref 1500–7800)
Platelets: 310 10*3/uL (ref 140–400)
RBC: 4.91 MIL/uL (ref 3.80–5.10)
RDW: 14.1 % (ref 11.0–15.0)
WBC: 11.2 10*3/uL — ABNORMAL HIGH (ref 3.8–10.8)

## 2017-01-31 MED ORDER — INSULIN DEGLUDEC-LIRAGLUTIDE 100-3.6 UNIT-MG/ML ~~LOC~~ SOPN: 50.0000 [IU] | PEN_INJECTOR | Freq: Every day | SUBCUTANEOUS | 2 refills | Status: DC

## 2017-01-31 MED ORDER — VITAMIN D (ERGOCALCIFEROL) 1.25 MG (50000 UNIT) PO CAPS: 50000.0000 [IU] | ORAL_CAPSULE | ORAL | 0 refills | Status: DC

## 2017-01-31 MED ORDER — TRAZODONE HCL 50 MG PO TABS: 25.0000 mg | ORAL_TABLET | Freq: Every evening | ORAL | 0 refills | Status: DC | PRN

## 2017-01-31 NOTE — Patient Instructions (Signed)
Go up on Xultophy by 2 units every 2 days to get fasting to below 140.
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
## 2017-02-01 ENCOUNTER — Encounter: Payer: Self-pay | Admitting: Family Medicine

## 2017-02-01 LAB — HEMOGLOBIN A1C
Hgb A1c MFr Bld: 10.3 % — ABNORMAL HIGH (ref <5.7)
Mean Plasma Glucose: 249 mg/dL

## 2017-02-13 ENCOUNTER — Encounter: Payer: Self-pay | Admitting: Family Medicine

## 2017-03-15 ENCOUNTER — Ambulatory Visit: Payer: 59 | Admitting: Family Medicine

## 2017-04-03 DIAGNOSIS — G4733 Obstructive sleep apnea (adult) (pediatric): Secondary | ICD-10-CM | POA: Diagnosis not present

## 2017-04-23 DIAGNOSIS — R0602 Shortness of breath: Secondary | ICD-10-CM | POA: Diagnosis not present

## 2017-04-23 DIAGNOSIS — R0600 Dyspnea, unspecified: Secondary | ICD-10-CM | POA: Diagnosis not present

## 2017-04-23 DIAGNOSIS — M25473 Effusion, unspecified ankle: Secondary | ICD-10-CM | POA: Diagnosis not present

## 2017-05-24 ENCOUNTER — Other Ambulatory Visit: Payer: Self-pay | Admitting: Family Medicine

## 2017-05-24 DIAGNOSIS — E559 Vitamin D deficiency, unspecified: Secondary | ICD-10-CM

## 2017-05-24 NOTE — Telephone Encounter (Signed)
Patient requesting refill of Vitamin D to [facility].

## 2017-06-05 DIAGNOSIS — N6452 Nipple discharge: Secondary | ICD-10-CM

## 2017-06-05 HISTORY — DX: Nipple discharge: N64.52

## 2017-06-13 ENCOUNTER — Other Ambulatory Visit: Payer: Self-pay

## 2017-06-13 ENCOUNTER — Encounter: Payer: Self-pay | Admitting: Family Medicine

## 2017-06-13 ENCOUNTER — Ambulatory Visit (INDEPENDENT_AMBULATORY_CARE_PROVIDER_SITE_OTHER): Payer: 59 | Admitting: Family Medicine

## 2017-06-13 VITALS — BP 118/72 | HR 117 | Temp 98.6°F | Resp 18 | Ht 67.0 in | Wt 312.2 lb

## 2017-06-13 DIAGNOSIS — N6321 Unspecified lump in the left breast, upper outer quadrant: Secondary | ICD-10-CM | POA: Diagnosis not present

## 2017-06-13 DIAGNOSIS — E668 Other obesity: Secondary | ICD-10-CM | POA: Diagnosis not present

## 2017-06-13 DIAGNOSIS — G4709 Other insomnia: Secondary | ICD-10-CM

## 2017-06-13 DIAGNOSIS — D72829 Elevated white blood cell count, unspecified: Secondary | ICD-10-CM | POA: Diagnosis not present

## 2017-06-13 DIAGNOSIS — K7581 Nonalcoholic steatohepatitis (NASH): Secondary | ICD-10-CM

## 2017-06-13 DIAGNOSIS — E1129 Type 2 diabetes mellitus with other diabetic kidney complication: Secondary | ICD-10-CM | POA: Diagnosis not present

## 2017-06-13 DIAGNOSIS — Z79899 Other long term (current) drug therapy: Secondary | ICD-10-CM | POA: Diagnosis not present

## 2017-06-13 DIAGNOSIS — G40409 Other generalized epilepsy and epileptic syndromes, not intractable, without status epilepticus: Secondary | ICD-10-CM

## 2017-06-13 DIAGNOSIS — E559 Vitamin D deficiency, unspecified: Secondary | ICD-10-CM

## 2017-06-13 DIAGNOSIS — E785 Hyperlipidemia, unspecified: Secondary | ICD-10-CM | POA: Diagnosis not present

## 2017-06-13 DIAGNOSIS — E1169 Type 2 diabetes mellitus with other specified complication: Secondary | ICD-10-CM | POA: Diagnosis not present

## 2017-06-13 DIAGNOSIS — G4731 Primary central sleep apnea: Secondary | ICD-10-CM

## 2017-06-13 LAB — CBC WITH DIFFERENTIAL/PLATELET
Basophils Absolute: 0 cells/uL (ref 0–200)
Basophils Relative: 0 %
EOS ABS: 0 {cells}/uL — AB (ref 15–500)
EOS PCT: 0 %
HCT: 40 % (ref 35.0–45.0)
HEMOGLOBIN: 13.1 g/dL (ref 11.7–15.5)
LYMPHS ABS: 2794 {cells}/uL (ref 850–3900)
Lymphocytes Relative: 22 %
MCH: 26 pg — ABNORMAL LOW (ref 27.0–33.0)
MCHC: 32.8 g/dL (ref 32.0–36.0)
MCV: 79.4 fL — ABNORMAL LOW (ref 80.0–100.0)
MPV: 10.5 fL (ref 7.5–12.5)
Monocytes Absolute: 635 cells/uL (ref 200–950)
Monocytes Relative: 5 %
NEUTROS ABS: 9271 {cells}/uL — AB (ref 1500–7800)
Neutrophils Relative %: 73 %
Platelets: 298 10*3/uL (ref 140–400)
RBC: 5.04 MIL/uL (ref 3.80–5.10)
RDW: 14.1 % (ref 11.0–15.0)
WBC: 12.7 10*3/uL — ABNORMAL HIGH (ref 3.8–10.8)

## 2017-06-13 LAB — COMPLETE METABOLIC PANEL WITH GFR
ALBUMIN: 4 g/dL (ref 3.6–5.1)
ALT: 17 U/L (ref 6–29)
AST: 12 U/L (ref 10–30)
Alkaline Phosphatase: 108 U/L (ref 33–115)
BUN: 8 mg/dL (ref 7–25)
CALCIUM: 9.2 mg/dL (ref 8.6–10.2)
CHLORIDE: 103 mmol/L (ref 98–110)
CO2: 21 mmol/L (ref 20–32)
Creat: 0.6 mg/dL (ref 0.50–1.10)
GFR, Est Non African American: >89 mL/min (ref >=60)
GLUCOSE: 301 mg/dL — AB (ref 65–99)
Potassium: 4.1 mmol/L (ref 3.5–5.3)
Sodium: 136 mmol/L (ref 135–146)
TOTAL PROTEIN: 7.1 g/dL (ref 6.1–8.1)
Total Bilirubin: 0.4 mg/dL (ref 0.2–1.2)

## 2017-06-13 LAB — LIPID PANEL
CHOL/HDL RATIO: 5.6 ratio — AB (ref <5.0)
CHOLESTEROL: 218 mg/dL — AB (ref <200)
HDL: 39 mg/dL — ABNORMAL LOW (ref >50)
LDL Cholesterol: 158 mg/dL — ABNORMAL HIGH (ref <100)
Triglycerides: 104 mg/dL (ref <150)
VLDL: 21 mg/dL (ref <30)

## 2017-06-13 LAB — POCT GLYCOSYLATED HEMOGLOBIN (HGB A1C): Hemoglobin A1C: 10.9

## 2017-06-13 LAB — GLUCOSE, POCT (MANUAL RESULT ENTRY): POC Glucose: 316 mg/dl — AB (ref 70–99)

## 2017-06-13 MED ORDER — DULAGLUTIDE 1.5 MG/0.5ML ~~LOC~~ SOAJ: 1.5000 mg | SUBCUTANEOUS | 2 refills | Status: DC

## 2017-06-13 MED ORDER — VITAMIN D (ERGOCALCIFEROL) 1.25 MG (50000 UNIT) PO CAPS: 50000.0000 [IU] | ORAL_CAPSULE | ORAL | 0 refills | Status: DC

## 2017-06-13 MED ORDER — TRAZODONE HCL 100 MG PO TABS: 100.0000 mg | ORAL_TABLET | Freq: Every evening | ORAL | 2 refills | Status: DC | PRN

## 2017-06-13 MED ORDER — FLUCONAZOLE 150 MG PO TABS: 150.0000 mg | ORAL_TABLET | ORAL | 2 refills | Status: DC

## 2017-06-13 MED ORDER — EMPAGLIFLOZIN-METFORMIN HCL ER 12.5-1000 MG PO TB24: 2.0000 | ORAL_TABLET | Freq: Every day | ORAL | 2 refills | Status: DC

## 2017-06-13 NOTE — Progress Notes (Unsigned)
3d mamma

## 2017-06-13 NOTE — Progress Notes (Signed)
Name: Erin Good   MRN: [id number]    DOB: Feb 28, 1976   Date:06/13/2017       Progress Note  Subjective  Chief Complaint  Chief Complaint  Patient presents with  . Diabetes    no medication for 2 months has not been checking blood sugar   . Medication Refill    insurance will not cover xultophy  . Obesity  . Hyperlipidemia  . Breast Problem    mass in left breast pt noticed pain and tenderness about a week ago and a brown discharge from nipple     HPI  DMII: she was seen in Feb 2018 with Bell's palsy and was not using any of her medications at the time.  hgbA1C 9.9 last November, 10.3 and today is 10.9%- she is out of  Xultophy because of cost, no longer covered by insurance.  Glucose at home is usually in the 200-300's She still feels thirsty all the time and has polyuria occasionally, but no polyphagia. She is up to date with eye exam and we will get records... She has dyslipidemia, but refuses to take statin therapy. We will recheck labs, and change medication to something that is covered, discussed possible side effects, including yeast infection.   OSA: no longer  wearing CPAP every night, it disturbs her sleep so usually wearing only on weekends, we tried Trazodone, but she never went up on the dose, we will try 100 mg. Still has some pain on right shoulder but not as severe, still seeing chiropractor.   Right shoulder impingement syndrome: started August 2017. She had a seizure that caused her to fall and injury her right shoulder. She was seen by Cassie Freer , had MRI and advised to have PT, however unable to keep appointments because of her work schedule and cost. She had a steroid injection without much improvement and has been seeing a chiropractor ( [facility] ), pain is not as severe now, doing better.   Seizure disorder: she sees Dr. Lysle Rubens, had one seizure this past Summer, she is doing well since. Taking medication as prescribed. She is on  higher dose of medication since episode, explained importance of taking Vitamin D supplementation, and we will send another refill to pharmacy   Obesity: weight is stable, with slightly drop, she states she eats smaller portions, she is trying to avoid carbohydrates. Drinking more water, drinks sodas very seldom , about 3 per week and diet.  Leukocytosis: we will recheck labs  Breast lump left: she noticed pain initially on left breast, followed by a lump and some brown discharge from nipple, paternal grandmother had breast cancer, she is worried about it.   Patient Active Problem List   Diagnosis Date Noted  . Vitamin D deficiency 01/31/2017  . Right shoulder tendinitis 01/31/2017  . Tendinosis 01/31/2017  . Bell's palsy 12/19/2016  . Tachycardia 09/30/2016  . Depression with anxiety 05/06/2015  . Allergic rhinitis 04/20/2015  . Anxiety and depression 04/20/2015  . 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 Gastro-esophageal reflux disease without esophagitis 04/20/2015  . Dysmetabolic syndrome 56/38/7564  . Extreme obesity 04/20/2015  . NASH (nonalcoholic steatohepatitis) 04/20/2015  . Allergy to nuts 04/20/2015  . Calculus of kidney 04/20/2015  . Type 2 diabetes mellitus with renal manifestations ([place]) 04/20/2015  . Central sleep apnea 11/26/2008  . Dyslipidemia 07/02/2008  . Leukocytosis 07/29/2007    Past Surgical History:  Procedure Laterality Date  . CESAREAN SECTION     X  2  . RIGHT OOPHORECTOMY Right 2001   benign tumor  . TUBAL LIGATION  2007    Family History  Problem Relation Age of Onset  . Diabetes Mother   . Cancer Neg Hx   . Heart disease Neg Hx     Social History   Social History  . Marital status: Married    Spouse name: Roderic Palau  . Number of children: 2  . Years of education: College   Occupational History  . accountant    Social History Main Topics  . Smoking status: Never Smoker  . Smokeless tobacco: Never Used  . Alcohol use No   . Drug use: No  . Sexual activity: Yes    Partners: Male    Birth control/ protection: Surgical   Other Topics Concern  . Not on file   Social History Narrative  . No narrative on file     Current Outpatient Prescriptions:  .  Cholecalciferol (VITAMIN D3) 5000 units CAPS, Take 5,000 Units by mouth daily., Disp: , Rfl:  .  clonazePAM (KLONOPIN) 0.5 MG tablet, Take 0.5 mg by mouth only at the onset of a seizure, Disp: , Rfl: 3 .  Dulaglutide (TRULICITY) 1.5 ST/4.1DQ SOPN, Inject 1.5 mg into the skin once a week., Disp: 4 pen, Rfl: 2 .  Empagliflozin-Metformin HCl ER (SYNJARDY XR) 12.03-999 MG TB24, Take 2 tablets by mouth daily., Disp: 60 tablet, Rfl: 2 .  EPINEPHrine (EPIPEN 2-PAK) 0.3 mg/0.3 mL IJ SOAJ injection, Inject 0.3 mg into the muscle once as needed (for anaphylaxis). , Disp: , Rfl:  .  glucose blood (ONETOUCH VERIO) test strip, 1 strip by Does not apply route daily. Reported on 12/21/2015, Disp: , Rfl:  .  traZODone (DESYREL) 100 MG tablet, Take 1 tablet (100 mg total) by mouth at bedtime as needed for sleep., Disp: 30 tablet, Rfl: 2 .  Vitamin D, Ergocalciferol, (DRISDOL) 50000 units CAPS capsule, Take 1 capsule (50,000 Units total) by mouth every 7 (seven) days., Disp: 12 capsule, Rfl: 0 .  zonisamide (ZONEGRAN) 100 MG capsule, Take 500 mg by mouth at bedtime., Disp: , Rfl:   Allergies  Allergen Reactions  . Peanuts [Peanut Oil] Anaphylaxis  . 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  Respiratory: Negative for cough and shortness of breath.   Cardiovascular: Negative for chest pain or palpitations.  Gastrointestinal: Negative for abdominal pain, no bowel changes.  Musculoskeletal: Negative for gait problem or joint swelling.  Skin: Negative for rash.  Neurological: Negative for dizziness or headache.  No other specific complaints in a complete review of systems (except  as listed in HPI above).  Objective  Vitals:   06/13/17 0827  BP: 118/72  Pulse: (!) 117  Resp: 18  Temp: 98.6 F (37 C)  SpO2: 97%  Weight: (!) 312 lb 4 oz (141.6 kg)  Height: 5' 7"  (1.702 m)    Body mass index is 48.91 kg/m.  Physical Exam  Constitutional: Patient appears well-developed and well-nourished. Obese  No distress.  HEENT: head atraumatic, normocephalic, pupils equal and reactive to light, neck supple, throat within normal limits Cardiovascular: Normal rate, regular rhythm and normal heart sounds.  No murmur heard. No BLE edema. Pulmonary/Chest: Effort normal and breath sounds normal. No respiratory distress. Breast: tender round mass on left upper outer quadrant, white nipple discharge noticed, no axillary lymphadenopathy  Abdominal: Soft.  There  is no tenderness. Psychiatric: Patient has a normal mood and affect. behavior is normal. Judgment and thought content normal.  Recent Results (from the past 2160 hour(s))  POCT Glucose (CBG)     Status: Abnormal   Collection Time: 06/13/17  8:36 AM  Result Value Ref Range   POC Glucose 316 (A) 70 - 99 mg/dl  POCT HgB A1C     Status: Abnormal   Collection Time: 06/13/17  8:42 AM  Result Value Ref Range   Hemoglobin A1C 10.9      PHQ2/9: Depression screen [facility] 2/9 01/31/2017 12/19/2016 09/21/2016 07/20/2015 05/06/2015  Decreased Interest 0 0 0 0 1  Down, Depressed, Hopeless 0 0 0 0 1  PHQ - 2 Score 0 0 0 0 2  Altered sleeping - - - - 3  Tired, decreased energy - - - - 3  Change in appetite - - - - 3  Feeling bad or failure about yourself  - - - - 0  Moving slowly or fidgety/restless - - - - 1  Suicidal thoughts - - - - 0  PHQ-9 Score - - - - 12    Fall Risk: Fall Risk  01/31/2017 12/19/2016 09/21/2016 07/20/2015 05/06/2015  Falls in the past year? Yes Yes No No Yes  Number falls in past yr: 1 1 - - 1  Injury with Fall? Yes Yes - - No  Comment - Hurt her right shoulder - - -      Assessment & Plan  1. Type 2  diabetes mellitus with other diabetic kidney complication, without long-term current use of insulin (HCC)  - POCT HgB A1C - POCT Glucose (CBG) - Dulaglutide (TRULICITY) 1.5 ZL/9.3TT SOPN; Inject 1.5 mg into the skin once a week.  Dispense: 4 pen; Refill: 2 - Empagliflozin-Metformin HCl ER (SYNJARDY XR) 12.03-999 MG TB24; Take 2 tablets by mouth daily.  Dispense: 60 tablet; Refill: 2  2. 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 Lysle Rubens, seizure free for the over one year  3. Dyslipidemia associated with type 2 diabetes mellitus (HCC)  - Lipid panel  4. NASH (nonalcoholic steatohepatitis)  Discussed importance of losing weight   5. Central sleep apnea  - traZODone (DESYREL) 100 MG tablet; Take 1 tablet (100 mg total) by mouth at bedtime as needed for sleep.  Dispense: 30 tablet; Refill: 2  6. Extreme obesity  Discussed with the patient the risk posed by an increased BMI. Discussed importance of portion control, calorie counting and at least 150 minutes of physical activity weekly. Avoid sweet beverages and drink more water. Eat at least 6 servings of fruit and vegetables daily   7. Vitamin D deficiency  - Vitamin D, Ergocalciferol, (DRISDOL) 50000 units CAPS capsule; Take 1 capsule (50,000 Units total) by mouth every 7 (seven) days.  Dispense: 12 capsule; Refill: 0  8. Other insomnia  - traZODone (DESYREL) 100 MG tablet; Take 1 tablet (100 mg total) by mouth at bedtime as needed for sleep.  Dispense: 30 tablet; Refill: 2  9. Long-term use of high-risk medication  - COMPLETE METABOLIC PANEL WITH GFR - CBC with Differential/Platelet - Vitamin B12  10. Breast lump on left side at 1 o'clock position  - MM Digital Diagnostic Bilat; Future - US BREAST COMPLETE UNI LEFT INC AXILLA; Future

## 2017-06-13 NOTE — Addendum Note (Signed)
Addended by: Inda Coke on: 06/13/2017 05:05 PM   Modules accepted: Orders

## 2017-06-14 LAB — VITAMIN B12: Vitamin B-12: 350 pg/mL (ref 200–1100)

## 2017-06-14 NOTE — Addendum Note (Signed)
Addended by: Inda Coke on: 06/14/2017 01:57 PM   Modules accepted: Orders

## 2017-06-20 ENCOUNTER — Ambulatory Visit
Admission: RE | Admit: 2017-06-20 | Discharge: 2017-06-20 | Disposition: A | Discharge status: Home / Self Care | Payer: 59 | Source: Ambulatory Visit | Attending: Family Medicine | Admitting: Family Medicine

## 2017-06-20 ENCOUNTER — Other Ambulatory Visit: Payer: BLUE CROSS/BLUE SHIELD

## 2017-06-20 DIAGNOSIS — R928 Other abnormal and inconclusive findings on diagnostic imaging of breast: Secondary | ICD-10-CM | POA: Diagnosis not present

## 2017-06-20 DIAGNOSIS — N6452 Nipple discharge: Secondary | ICD-10-CM | POA: Insufficient documentation

## 2017-06-20 DIAGNOSIS — N6311 Unspecified lump in the right breast, upper outer quadrant: Secondary | ICD-10-CM | POA: Insufficient documentation

## 2017-06-20 DIAGNOSIS — N6321 Unspecified lump in the left breast, upper outer quadrant: Secondary | ICD-10-CM | POA: Diagnosis not present

## 2017-06-20 DIAGNOSIS — N6489 Other specified disorders of breast: Secondary | ICD-10-CM | POA: Diagnosis not present

## 2017-06-20 HISTORY — DX: Nipple discharge: N64.52

## 2017-06-20 HISTORY — DX: Unspecified lump in unspecified breast: N63.0

## 2017-06-21 ENCOUNTER — Other Ambulatory Visit: Payer: Self-pay

## 2017-06-21 ENCOUNTER — Other Ambulatory Visit: Payer: Self-pay | Admitting: Family Medicine

## 2017-06-21 DIAGNOSIS — R928 Other abnormal and inconclusive findings on diagnostic imaging of breast: Secondary | ICD-10-CM

## 2017-06-21 DIAGNOSIS — N6310 Unspecified lump in the right breast, unspecified quadrant: Secondary | ICD-10-CM

## 2017-06-24 ENCOUNTER — Encounter: Payer: Self-pay | Admitting: General Surgery

## 2017-06-24 ENCOUNTER — Other Ambulatory Visit: Payer: Self-pay | Admitting: Family Medicine

## 2017-06-24 ENCOUNTER — Inpatient Hospital Stay: Payer: Self-pay

## 2017-06-24 ENCOUNTER — Ambulatory Visit (INDEPENDENT_AMBULATORY_CARE_PROVIDER_SITE_OTHER): Payer: 59 | Admitting: General Surgery

## 2017-06-24 VITALS — BP 130/78 | HR 78 | Resp 14 | Ht 67.0 in | Wt 312.0 lb

## 2017-06-24 DIAGNOSIS — N61 Mastitis without abscess: Secondary | ICD-10-CM | POA: Diagnosis not present

## 2017-06-24 DIAGNOSIS — N6310 Unspecified lump in the right breast, unspecified quadrant: Secondary | ICD-10-CM

## 2017-06-24 DIAGNOSIS — R928 Other abnormal and inconclusive findings on diagnostic imaging of breast: Secondary | ICD-10-CM

## 2017-06-24 DIAGNOSIS — N6321 Unspecified lump in the left breast, upper outer quadrant: Secondary | ICD-10-CM

## 2017-06-24 DIAGNOSIS — N6320 Unspecified lump in the left breast, unspecified quadrant: Secondary | ICD-10-CM

## 2017-06-24 NOTE — Progress Notes (Signed)
Patient ID: Erin Good, female   DOB: [birth year], 41 y.o.   MRN: [id number]  Chief Complaint  Patient presents with  . Other    mammogram    HPI Erin Good is a 41 y.o. female who presents for a breast evaluation. The most recent mammogram and left breast ultrasound was done on 06/20/2017. Patient does perform regular self breast checks and gets regular mammograms done. She had felt a knot and pain in her breast 3 weeks prior to her mammogram. She reports drainage from the left nipple brown in color once. She reports no prior breast problems. This was her first mammogram.   HPI  Past Medical History:  Diagnosis Date  . Allergic rhinitis   . Breast discharge 06/05/2017   2 weeks ago left  . Breast mass 12/06/2016   left  . Diabetes mellitus without complication ([place])   . Dyslipidemia   . Epilepsy ([place])   . Epilepsy ([place])   . Galactorrhea   . Hx gestational diabetes   . Kidney stones   . Obesity   . Seizures ([place])   . Sleep apnea   . Syncope and collapse   . Tachycardia     Past Surgical History:  Procedure Laterality Date  . CESAREAN SECTION     X 2  . RIGHT OOPHORECTOMY Right 2001   benign tumor  . TUBAL LIGATION  2007    Family History  Problem Relation Age of Onset  . Diabetes Mother   . Breast cancer Paternal Grandmother 66  . Cancer Neg Hx   . Heart disease Neg Hx     Social History Social History  Substance Use Topics  . Smoking status: Never Smoker  . Smokeless tobacco: Never Used  . Alcohol use No    Allergies  Allergen Reactions  . Peanuts [Peanut Oil] Anaphylaxis  . Aspirin Other (See Comments)    Does take because of her epilepsy/seizure     Current Outpatient Prescriptions  Medication Sig Dispense Refill  . Cholecalciferol (VITAMIN D3) 5000 units CAPS Take 5,000 Units by mouth daily.    . clonazePAM (KLONOPIN) 0.5 MG tablet Take 0.5 mg by mouth only at the onset of a seizure  3  . Dulaglutide (TRULICITY) 1.5 WO/0.3OZ  SOPN Inject 1.5 mg into the skin once a week. 4 pen 2  . Empagliflozin-Metformin HCl ER (SYNJARDY XR) 12.03-999 MG TB24 Take 2 tablets by mouth daily. 60 tablet 2  . EPINEPHrine (EPIPEN 2-PAK) 0.3 mg/0.3 mL IJ SOAJ injection Inject 0.3 mg into the muscle once as needed (for anaphylaxis).     . fluconazole (DIFLUCAN) 150 MG tablet Take 1 tablet (150 mg total) by mouth every other day. 3 tablet 2  . glucose blood (ONETOUCH VERIO) test strip 1 strip by Does not apply route daily. Reported on 12/21/2015    . traZODone (DESYREL) 100 MG tablet Take 1 tablet (100 mg total) by mouth at bedtime as needed for sleep. 30 tablet 2  . Vitamin D, Ergocalciferol, (DRISDOL) 50000 units CAPS capsule Take 1 capsule (50,000 Units total) by mouth every 7 (seven) days. 12 capsule 0  . zonisamide (ZONEGRAN) 100 MG capsule Take 500 mg by mouth at bedtime.     No current facility-administered medications for this visit.     Review of Systems Review of Systems  Constitutional: Negative.   Respiratory: Negative.     Blood pressure 130/78, pulse 78, resp. rate 14, height 5' 7"  (1.702 m), weight (!) 312 lb (  141.5 kg), last menstrual period 05/27/2017.  Physical Exam Physical Exam  Constitutional: She is oriented to person, place, and time. She appears well-developed and well-nourished.  Eyes: Conjunctivae are normal. No scleral icterus.  Neck: Neck supple.  Cardiovascular: Normal rate, regular rhythm and normal heart sounds.   Pulmonary/Chest: Effort normal and breath sounds normal. Right breast exhibits no inverted nipple, no mass, no nipple discharge, no skin change and no tenderness. Left breast exhibits mass. Left breast exhibits no inverted nipple, no nipple discharge, no skin change and no tenderness.    Abdominal: Soft. Normal appearance. There is no tenderness.  Lymphadenopathy:    She has no cervical adenopathy.    She has axillary adenopathy.       Left axillary: Lateral (2 cm firm lymph node, mobile  and nontender) adenopathy present.  Neurological: She is alert and oriented to person, place, and time.  Skin: Skin is warm and dry.  Psychiatric: She has a normal mood and affect.    Data Reviewed Mammogram and ultrasound reviewed  There is a ill-defined mass corresponding to the palpable mass in the left breast at 1:00 3-4 cm from the nipple In addition multiple nodules in the outer aspect of the right breast was noted on mammogram with ultrasound showing some benign-appearing small nodules. A questionable lymph node in the left axilla was noted Assessment    Palpable left breast mass. Imaging showing also some small nodules in the right breast which appear to be benign based on ultrasound. Left axillary node is of questionable significance. Core biopsy of the palpable mass left breast is appropriate at this time. If it is benign A and for short-term follow-up in 6 months with repeat imaging.    Plan   above impression was discussed fully with the patient when she was agreeable to core biopsy of the left breast mass. Core biopsy was completed. On the first 2 passes   the output was more of a thick liquid content and the mass seemed to become much less distinct. Additional samples were obtained from the edges of this mass. Clip was placed.   Further plans based on pathology report.    HPI, Physical Exam, Assessment and Plan have been scribed under the direction and in the presence of Mckinley Jewel, MD  Concepcion Living, LPN  I have completed the exam and reviewed the above documentation for accuracy and completeness.  I agree with the above.  Haematologist has been used and any errors in dictation or transcription are unintentional.  Erin Good Good. Jamal Collin, M.D., F.A.C.S.   Erin Good 06/24/2017, 1:18 PM

## 2017-06-24 NOTE — Patient Instructions (Signed)

## 2017-06-26 NOTE — Progress Notes (Signed)
Patient to follow up with Dr. Jamal Collin on 07-31-17 at 9:45 am. Patient aware.

## 2017-06-27 ENCOUNTER — Telehealth: Payer: Self-pay | Admitting: *Deleted

## 2017-06-27 NOTE — Telephone Encounter (Signed)
Patient called concern about the abscess. She states it is tender but no redness, wants to speak with you about it and if she needs an antibiotic.

## 2017-07-04 DIAGNOSIS — G4733 Obstructive sleep apnea (adult) (pediatric): Secondary | ICD-10-CM | POA: Diagnosis not present

## 2017-07-31 ENCOUNTER — Ambulatory Visit: Payer: 59 | Admitting: General Surgery

## 2017-09-05 ENCOUNTER — Inpatient Hospital Stay: Payer: 59 | Attending: Oncology | Admitting: Oncology

## 2017-09-05 ENCOUNTER — Inpatient Hospital Stay: Payer: 59

## 2017-09-05 ENCOUNTER — Encounter: Payer: Self-pay | Admitting: Oncology

## 2017-09-05 ENCOUNTER — Encounter (INDEPENDENT_AMBULATORY_CARE_PROVIDER_SITE_OTHER): Payer: Self-pay

## 2017-09-05 VITALS — BP 124/89 | HR 98 | Temp 97.8°F | Resp 16 | Wt 318.0 lb

## 2017-09-05 DIAGNOSIS — E119 Type 2 diabetes mellitus without complications: Secondary | ICD-10-CM | POA: Insufficient documentation

## 2017-09-05 DIAGNOSIS — E785 Hyperlipidemia, unspecified: Secondary | ICD-10-CM | POA: Diagnosis not present

## 2017-09-05 DIAGNOSIS — G510 Bell's palsy: Secondary | ICD-10-CM | POA: Diagnosis not present

## 2017-09-05 DIAGNOSIS — G4733 Obstructive sleep apnea (adult) (pediatric): Secondary | ICD-10-CM | POA: Diagnosis not present

## 2017-09-05 DIAGNOSIS — K219 Gastro-esophageal reflux disease without esophagitis: Secondary | ICD-10-CM | POA: Insufficient documentation

## 2017-09-05 DIAGNOSIS — E559 Vitamin D deficiency, unspecified: Secondary | ICD-10-CM | POA: Diagnosis not present

## 2017-09-05 DIAGNOSIS — Z79899 Other long term (current) drug therapy: Secondary | ICD-10-CM | POA: Diagnosis not present

## 2017-09-05 DIAGNOSIS — E669 Obesity, unspecified: Secondary | ICD-10-CM | POA: Diagnosis not present

## 2017-09-05 DIAGNOSIS — D72829 Elevated white blood cell count, unspecified: Secondary | ICD-10-CM | POA: Insufficient documentation

## 2017-09-05 DIAGNOSIS — D729 Disorder of white blood cells, unspecified: Secondary | ICD-10-CM

## 2017-09-05 DIAGNOSIS — Z87442 Personal history of urinary calculi: Secondary | ICD-10-CM | POA: Insufficient documentation

## 2017-09-05 DIAGNOSIS — F418 Other specified anxiety disorders: Secondary | ICD-10-CM | POA: Insufficient documentation

## 2017-09-05 DIAGNOSIS — K7581 Nonalcoholic steatohepatitis (NASH): Secondary | ICD-10-CM | POA: Insufficient documentation

## 2017-09-05 LAB — CBC WITH DIFFERENTIAL/PLATELET
BASOS PCT: 0 %
Basophils Absolute: 0 10*3/uL (ref 0–0.1)
Eosinophils Absolute: 0 10*3/uL (ref 0–0.7)
Eosinophils Relative: 0 %
HEMATOCRIT: 38.2 % (ref 35.0–47.0)
Hemoglobin: 12.6 g/dL (ref 12.0–16.0)
LYMPHS PCT: 30 %
Lymphs Abs: 3.8 10*3/uL — ABNORMAL HIGH (ref 1.0–3.6)
MCH: 26 pg (ref 26.0–34.0)
MCHC: 32.9 g/dL (ref 32.0–36.0)
MCV: 78.9 fL — ABNORMAL LOW (ref 80.0–100.0)
MONO ABS: 0.6 10*3/uL (ref 0.2–0.9)
MONOS PCT: 5 %
NEUTROS ABS: 8.3 10*3/uL — AB (ref 1.4–6.5)
NEUTROS PCT: 65 %
Platelets: 298 10*3/uL (ref 150–440)
RBC: 4.84 MIL/uL (ref 3.80–5.20)
RDW: 14.2 % (ref 11.5–14.5)
WBC: 12.8 10*3/uL — ABNORMAL HIGH (ref 3.6–11.0)

## 2017-09-05 LAB — SEDIMENTATION RATE: Sed Rate: 59 mm/hr — ABNORMAL HIGH (ref 0–20)

## 2017-09-05 NOTE — Progress Notes (Signed)
Patient here for evaluation of leukocytosis. She states that this has going on for a while, but her primary care MD wants to get it checked to make sure its not something serious. She states that she feels well and denies having any pain today.

## 2017-09-09 ENCOUNTER — Encounter: Payer: Self-pay | Admitting: Oncology

## 2017-09-09 LAB — COMP PANEL: LEUKEMIA/LYMPHOMA

## 2017-09-09 NOTE — Progress Notes (Signed)
Hematology/Oncology Consult note [facility] Telephone:([phone] [phone]  Patient Care Team: Steele Sizer, MD as PCP - General Roque Cash, MD as Referring Physician (Psychiatry) [name], MD as Referring Physician (Obstetrics and Gynecology)   Name of the patient: Erin Good  [id number]  1976-03-08    Reason for referral- leucocytosis   Referring physician- Dr. Steele Sizer  Date of visit: 09/09/17   History of presenting illness-patient is a 41 year old female with a past medical history significant for obstructive sleep apnea and diabetes among other medical problems.  She also has a history of anxiety and depression and seizure disorder.  She has been referred to [place] for management of leukocytosis.  Of note patient's white count has been chronically elevated at least dating back to 2010.  Typically her white count ranges between 11-16.  Differential mainly shows neutrophilia.  Her hemoglobin has been normal between 12-13 with intermittent microcytosis.  Platelet count has been normal.  Patient denies any lumps or bumps anywhere.  Denies any fevers, chills, unintentional weight loss or drenching night sweats.  She denies any recurrent infections.  Her medication list has also been reviewed and she is currently not on any medications that can cause leukocytosis.  ECOG PS- 0  Pain scale- 0   Review of systems- Review of Systems  Constitutional: Negative for chills, fever, malaise/fatigue and weight loss.  HENT: Negative for congestion, ear discharge and nosebleeds.   Eyes: Negative for blurred vision.  Respiratory: Negative for cough, hemoptysis, sputum production, shortness of breath and wheezing.   Cardiovascular: Negative for chest pain, palpitations, orthopnea and claudication.  Gastrointestinal: Negative for abdominal pain, blood in stool, constipation, diarrhea, heartburn, melena, nausea and vomiting.  Genitourinary:  Negative for dysuria, flank pain, frequency, hematuria and urgency.  Musculoskeletal: Negative for back pain, joint pain and myalgias.  Skin: Negative for rash.  Neurological: Negative for dizziness, tingling, focal weakness, seizures, weakness and headaches.  Endo/Heme/Allergies: Does not bruise/bleed easily.  Psychiatric/Behavioral: Negative for depression and suicidal ideas. The patient does not have insomnia.     Allergies  Allergen Reactions  . Peanuts [Peanut Oil] Anaphylaxis  . Aspirin Other (See Comments)    Does take because of her epilepsy/seizure     Patient Active Problem List   Diagnosis Date Noted  . Vitamin D deficiency 01/31/2017  . Right shoulder tendinitis 01/31/2017  . Tendinosis 01/31/2017  . Bell's palsy 12/19/2016  . Tachycardia 09/30/2016  . Depression with anxiety 05/06/2015  . Allergic rhinitis 04/20/2015  . Anxiety and depression 04/20/2015  . 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 Gastro-esophageal reflux disease without esophagitis 04/20/2015  . Dysmetabolic syndrome 37/62/8315  . Extreme obesity 04/20/2015  . NASH (nonalcoholic steatohepatitis) 04/20/2015  . Allergy to nuts 04/20/2015  . Calculus of kidney 04/20/2015  . Type 2 diabetes mellitus with renal manifestations ([place]) 04/20/2015  . Central sleep apnea 11/26/2008  . Dyslipidemia 07/02/2008  . Leukocytosis 07/29/2007     Past Medical History:  Diagnosis Date  . Allergic rhinitis   . Breast discharge 06/05/2017   2 weeks ago left  . Breast mass 12/06/2016   left  . Diabetes mellitus without complication ([place])   . Dyslipidemia   . Epilepsy ([place])   . Epilepsy ([place])   . Galactorrhea   . Hx gestational diabetes   . Kidney stones   . Obesity   . Seizures ([place])   . Sleep apnea   . Syncope and collapse   . Tachycardia  Past Surgical History:  Procedure Laterality Date  . CESAREAN SECTION     X 2  . RIGHT OOPHORECTOMY Right 2001   benign tumor  . TUBAL LIGATION   2007    Social History   Socioeconomic History  . Marital status: Married    Spouse name: Roderic Palau  . Number of children: 2  . Years of education: College  . Highest education level: Not on file  Social Needs  . Financial resource strain: Not on file  . Food insecurity - worry: Not on file  . Food insecurity - inability: Not on file  . Transportation needs - medical: Not on file  . Transportation needs - non-medical: Not on file  Occupational History  . Occupation: Optometrist  Tobacco Use  . Smoking status: Never Smoker  . Smokeless tobacco: Never Used  Substance and Sexual Activity  . Alcohol use: No  . Drug use: No  . Sexual activity: Yes    Partners: Male    Birth control/protection: Surgical  Other Topics Concern  . Not on file  Social History Narrative  . Not on file     Family History  Problem Relation Age of Onset  . Diabetes Mother   . Breast cancer Paternal Grandmother 30  . Cancer Paternal Grandmother   . Cancer Paternal Aunt   . Cancer Maternal Grandmother   . Heart disease Neg Hx      Current Outpatient Medications:  .  Cholecalciferol (VITAMIN D3) 5000 units CAPS, Take 5,000 Units by mouth daily., Disp: , Rfl:  .  clonazePAM (KLONOPIN) 0.5 MG tablet, Take 0.5 mg by mouth only at the onset of a seizure, Disp: , Rfl: 3 .  Dulaglutide (TRULICITY) 1.5 ZY/6.0YT SOPN, Inject 1.5 mg into the skin once a week., Disp: 4 pen, Rfl: 2 .  Empagliflozin-Metformin HCl ER (SYNJARDY XR) 12.03-999 MG TB24, Take 2 tablets by mouth daily., Disp: 60 tablet, Rfl: 2 .  EPINEPHrine (EPIPEN 2-PAK) 0.3 mg/0.3 mL IJ SOAJ injection, Inject 0.3 mg into the muscle once as needed (for anaphylaxis). , Disp: , Rfl:  .  fluconazole (DIFLUCAN) 150 MG tablet, Take 1 tablet (150 mg total) by mouth every other day., Disp: 3 tablet, Rfl: 2 .  glucose blood (ONETOUCH VERIO) test strip, 1 strip by Does not apply route daily. Reported on 12/21/2015, Disp: , Rfl:  .  traZODone (DESYREL) 100 MG  tablet, Take 1 tablet (100 mg total) by mouth at bedtime as needed for sleep., Disp: 30 tablet, Rfl: 2 .  Vitamin D, Ergocalciferol, (DRISDOL) 50000 units CAPS capsule, Take 1 capsule (50,000 Units total) by mouth every 7 (seven) days., Disp: 12 capsule, Rfl: 0 .  zonisamide (ZONEGRAN) 100 MG capsule, Take 500 mg by mouth at bedtime., Disp: , Rfl:    Physical exam:  Vitals:   09/05/17 1108  BP: 124/89  Pulse: 98  Resp: 16  Temp: 97.8 F (36.6 C)  TempSrc: Tympanic  Weight: (!) 318 lb (144.2 kg)   Physical Exam  Constitutional: She is oriented to person, place, and time.  Obese. Appears in no acute distress  HENT:  Head: Normocephalic and atraumatic.  Eyes: EOM are normal. Pupils are equal, round, and reactive to light.  Neck: Normal range of motion.  Cardiovascular: Normal rate, regular rhythm and normal heart sounds.  Pulmonary/Chest: Effort normal and breath sounds normal.  Abdominal: Soft. Bowel sounds are normal.  No palpable splenomegaly  Lymphadenopathy:  No palpable supraclavicular, cervical, axillary or inguinal adenopathy  Neurological: She is alert and oriented to person, place, and time.  Skin: Skin is warm and dry.       CMP Latest Ref Rng & Units 06/13/2017  Glucose 65 - 99 mg/dL 301(H)  BUN 7 - 25 mg/dL 8  Creatinine 0.50 - 1.10 mg/dL 0.60  Sodium 135 - 146 mmol/L 136  Potassium 3.5 - 5.3 mmol/L 4.1  Chloride 98 - 110 mmol/L 103  CO2 20 - 32 mmol/L 21  Calcium 8.6 - 10.2 mg/dL 9.2  Total Protein 6.1 - 8.1 g/dL 7.1  Total Bilirubin 0.2 - 1.2 mg/dL 0.4  Alkaline Phos 33 - 115 U/L 108  AST 10 - 30 U/L 12  ALT 6 - 29 U/L 17   CBC Latest Ref Rng & Units 09/05/2017  WBC 3.6 - 11.0 K/uL 12.8(H)  Hemoglobin 12.0 - 16.0 g/dL 12.6  Hematocrit 35.0 - 47.0 % 38.2  Platelets 150 - 440 K/uL 298    No images are attached to the encounter.  No results found.  Assessment and plan- Patient is a 41 y.o. female referred to [place] for evaluation and management of  leukocytosis.  Leukocytosis: Differential mainly shows neutrophilia and this has been chronic dating back to at least 2010.  Also patient has mild leukocytosis with a white count ranging between 11-16 and it waxes and wanes but has not been consistently trending up.  This is likely benign/reactive.  Today I will check CBC with differential, peripheral flow cytometry and ESR.  If the above tests are normal I am inclined to monitor this conservatively without the need for bone marrow biopsy.  I will see her back in 2 weeks time to discuss the results of her blood work   Thank you for this kind referral and the opportunity to participate in the care of this patient   Visit Diagnosis 1. Neutrophilia     Dr. Randa Evens, MD, MPH [facility] Pager- [phone] 09/09/2017 7:57 AM

## 2017-09-17 ENCOUNTER — Ambulatory Visit (INDEPENDENT_AMBULATORY_CARE_PROVIDER_SITE_OTHER): Payer: 59 | Admitting: Family Medicine

## 2017-09-17 ENCOUNTER — Encounter: Payer: Self-pay | Admitting: Family Medicine

## 2017-09-17 VITALS — BP 130/84 | HR 114 | Resp 14 | Ht 67.0 in | Wt 314.1 lb

## 2017-09-17 DIAGNOSIS — E559 Vitamin D deficiency, unspecified: Secondary | ICD-10-CM

## 2017-09-17 DIAGNOSIS — E668 Other obesity: Secondary | ICD-10-CM

## 2017-09-17 DIAGNOSIS — G40409 Other generalized epilepsy and epileptic syndromes, not intractable, without status epilepticus: Secondary | ICD-10-CM

## 2017-09-17 DIAGNOSIS — D72829 Elevated white blood cell count, unspecified: Secondary | ICD-10-CM

## 2017-09-17 DIAGNOSIS — E785 Hyperlipidemia, unspecified: Secondary | ICD-10-CM

## 2017-09-17 DIAGNOSIS — E1169 Type 2 diabetes mellitus with other specified complication: Secondary | ICD-10-CM | POA: Diagnosis not present

## 2017-09-17 DIAGNOSIS — N6321 Unspecified lump in the left breast, upper outer quadrant: Secondary | ICD-10-CM | POA: Diagnosis not present

## 2017-09-17 DIAGNOSIS — G4731 Primary central sleep apnea: Secondary | ICD-10-CM

## 2017-09-17 DIAGNOSIS — E1129 Type 2 diabetes mellitus with other diabetic kidney complication: Secondary | ICD-10-CM | POA: Diagnosis not present

## 2017-09-17 LAB — POCT GLYCOSYLATED HEMOGLOBIN (HGB A1C): HEMOGLOBIN A1C: 9.4

## 2017-09-17 MED ORDER — ATORVASTATIN CALCIUM 40 MG PO TABS: 40.0000 mg | ORAL_TABLET | Freq: Every day | ORAL | 2 refills | Status: DC

## 2017-09-17 MED ORDER — INSULIN DEGLUDEC-LIRAGLUTIDE 100-3.6 UNIT-MG/ML ~~LOC~~ SOPN: 20.0000 [IU] | PEN_INJECTOR | Freq: Every day | SUBCUTANEOUS | 2 refills | Status: DC

## 2017-09-17 MED ORDER — VITAMIN D (ERGOCALCIFEROL) 1.25 MG (50000 UNIT) PO CAPS: 50000.0000 [IU] | ORAL_CAPSULE | ORAL | 0 refills | Status: DC

## 2017-09-17 MED ORDER — INSULIN PEN NEEDLE 30G X 8 MM MISC: 1.0000 | 2 refills | Status: DC | PRN

## 2017-09-17 MED ORDER — EMPAGLIFLOZIN-METFORMIN HCL ER 12.5-1000 MG PO TB24: 2.0000 | ORAL_TABLET | Freq: Every day | ORAL | 2 refills | Status: DC

## 2017-09-17 MED ORDER — FLUCONAZOLE 150 MG PO TABS: 150.0000 mg | ORAL_TABLET | ORAL | 2 refills | Status: DC

## 2017-09-17 NOTE — Progress Notes (Signed)
Name: Erin Good   MRN: [id number]    DOB: Oct 03, 1976   Date:09/17/2017       Progress Note  Subjective  Chief Complaint  Chief Complaint  Patient presents with  . Medication Refill  . Diabetes  . Hyperlipidemia    HPI  DMII: she was seen in Feb 2018 with Bell's palsy and was not using any of her medications at the time.  hgbA1C 9.9  10.3 , 10.9% and now is 10.4%  she was on Xultophy but insurance stopped covering, it worked best for her to increase compliance, we will try another PA, she is failing current regiment of Trulicity and orals. Glucose at home is still up, 120's-220's, she has symptoms of hypoglycemia when down to 120's.  She still feels thirsty all the time and has polyuria occasionally, but no polyphagia.She has dyslipidemia, and after we discussed cardiovascular risk today she is willing to start taking statin therapy. She needs refills of Diflucan for yeast   OSA: she just got a new mask, and will resume wearing CPAP machine this week.   Right shoulder impingement syndrome: started August 2017. She had a seizure that caused her to fall and injury her right shoulder. She was seen by Cassie Freer , had MRI and advised to have PT, however unable to keep appointments because of her work schedule and cost. She had a steroid injection without much improvement and has been seeing a chiropractor ( [facility] ), pain is not as severe now,  Stable.  Seizure disorder: she sees Dr. Lysle Rubens, had one seizure  Summer 2017, she is doing well since. Taking medication as prescribed. She is on higher dose of medication since episode, explained importance of taking Vitamin D supplementation  Obesity: weight is stable, but trending down, lost another 4 lbs,  she states she eats smaller portions, she is trying to avoid carbohydrates. Drinking more water,  Diet sodas only, she has been cooking at home.   Leukocytosis: elevated, she saw hematologist.   Breast  lump left: she had biopsy done and was negative, but she lost to follow up.    Patient Active Problem List   Diagnosis Date Noted  . Vitamin D deficiency 01/31/2017  . Right shoulder tendinitis 01/31/2017  . Tendinosis 01/31/2017  . Bell's palsy 12/19/2016  . Tachycardia 09/30/2016  . Depression with anxiety 05/06/2015  . Allergic rhinitis 04/20/2015  . Anxiety and depression 04/20/2015  . 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 Gastro-esophageal reflux disease without esophagitis 04/20/2015  . Dysmetabolic syndrome 62/70/3500  . Extreme obesity 04/20/2015  . NASH (nonalcoholic steatohepatitis) 04/20/2015  . Allergy to nuts 04/20/2015  . Calculus of kidney 04/20/2015  . Type 2 diabetes mellitus with renal manifestations ([place]) 04/20/2015  . Central sleep apnea 11/26/2008  . Dyslipidemia 07/02/2008  . Leukocytosis 07/29/2007    Past Surgical History:  Procedure Laterality Date  . CESAREAN SECTION     X 2  . RIGHT OOPHORECTOMY Right 2001   benign tumor  . TUBAL LIGATION  2007    Family History  Problem Relation Age of Onset  . Diabetes Mother   . Breast cancer Paternal Grandmother 29  . Cancer Paternal Grandmother   . Cancer Paternal Aunt   . Cancer Maternal Grandmother   . Heart disease Neg Hx     Social History   Socioeconomic History  . Marital status: Married    Spouse name: Roderic Palau  . Number of children: 2  . Years  of education: College  . Highest education level: Not on file  Social Needs  . Financial resource strain: Not on file  . Food insecurity - worry: Not on file  . Food insecurity - inability: Not on file  . Transportation needs - medical: Not on file  . Transportation needs - non-medical: Not on file  Occupational History  . Occupation: Optometrist  Tobacco Use  . Smoking status: Never Smoker  . Smokeless tobacco: Never Used  Substance and Sexual Activity  . Alcohol use: No  . Drug use: No  . Sexual activity: Yes    Partners: Male     Birth control/protection: Surgical  Other Topics Concern  . Not on file  Social History Narrative  . Not on file     Current Outpatient Medications:  .  Cholecalciferol (VITAMIN D3) 5000 units CAPS, Take 5,000 Units by mouth daily., Disp: , Rfl:  .  clonazePAM (KLONOPIN) 0.5 MG tablet, Take 0.5 mg by mouth only at the onset of a seizure, Disp: , Rfl: 3 .  Empagliflozin-Metformin HCl ER (SYNJARDY XR) 12.03-999 MG TB24, Take 2 tablets daily by mouth., Disp: 60 tablet, Rfl: 2 .  EPINEPHrine (EPIPEN 2-PAK) 0.3 mg/0.3 mL IJ SOAJ injection, Inject 0.3 mg into the muscle once as needed (for anaphylaxis). , Disp: , Rfl:  .  glucose blood (ONETOUCH VERIO) test strip, 1 strip by Does not apply route daily. Reported on 12/21/2015, Disp: , Rfl:  .  traZODone (DESYREL) 100 MG tablet, Take 1 tablet (100 mg total) by mouth at bedtime as needed for sleep., Disp: 30 tablet, Rfl: 2 .  Vitamin D, Ergocalciferol, (DRISDOL) 50000 units CAPS capsule, Take 1 capsule (50,000 Units total) every 7 (seven) days by mouth., Disp: 12 capsule, Rfl: 0 .  zonisamide (ZONEGRAN) 100 MG capsule, Take 500 mg by mouth at bedtime., Disp: , Rfl:  .  atorvastatin (LIPITOR) 40 MG tablet, Take 1 tablet (40 mg total) daily by mouth., Disp: 30 tablet, Rfl: 2 .  fluconazole (DIFLUCAN) 150 MG tablet, Take 1 tablet (150 mg total) every other day by mouth., Disp: 3 tablet, Rfl: 2 .  Insulin Degludec-Liraglutide (XULTOPHY) 100-3.6 UNIT-MG/ML SOPN, Inject 20-50 Units daily into the skin., Disp: 9 mL, Rfl: 2 .  Insulin Pen Needle (NOVOFINE) 30G X 8 MM MISC, Inject 10 each as needed into the skin., Disp: 100 each, Rfl: 2  Allergies  Allergen Reactions  . Peanuts [Peanut Oil] Anaphylaxis  . 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  Respiratory: Negative for cough and shortness of breath.   Cardiovascular: Negative for chest pain or  palpitations.  Gastrointestinal: Negative for abdominal pain, no bowel changes.  Musculoskeletal: Negative for gait problem or joint swelling.  Skin: Negative for rash.  Neurological: Negative for dizziness or headache.  No other specific complaints in a complete review of systems (except as listed in HPI above).  Objective  Vitals:   09/17/17 0753  BP: 130/84  Pulse: (!) 114  Resp: 14  SpO2: 98%  Weight: (!) 314 lb 1.6 oz (142.5 kg)  Height: _0  (1.702 m)    Body mass index is 49.2 kg/m.  Physical Exam  Constitutional: Patient appears well-developed and well-nourished. Obese  No distress.  HEENT: head atraumatic, normocephalic, pupils equal and reactive to light, neck supple, throat within normal limits Cardiovascular: Normal rate, regular rhythm and normal heart sounds.  No murmur heard. No BLE edema. Pulmonary/Chest: Effort normal and breath sounds normal. No respiratory distress. Abdominal: Soft.  There is no tenderness. Psychiatric: Patient has a normal mood and affect. behavior is normal. Judgment and thought content normal.  Recent Results (from the past 2160 hour(s))  CBC with Differential     Status: Abnormal   Collection Time: 09/05/17 11:50 AM  Result Value Ref Range   WBC 12.8 (H) 3.6 - 11.0 K/uL   RBC 4.84 3.80 - 5.20 MIL/uL   Hemoglobin 12.6 12.0 - 16.0 g/dL   HCT 38.2 35.0 - 47.0 %   MCV 78.9 (L) 80.0 - 100.0 fL   MCH 26.0 26.0 - 34.0 pg   MCHC 32.9 32.0 - 36.0 g/dL   RDW 14.2 11.5 - 14.5 %   Platelets 298 150 - 440 K/uL   Neutrophils Relative % 65 %   Neutro Abs 8.3 (H) 1.4 - 6.5 K/uL   Lymphocytes Relative 30 %   Lymphs Abs 3.8 (H) 1.0 - 3.6 K/uL   Monocytes Relative 5 %   Monocytes Absolute 0.6 0.2 - 0.9 K/uL   Eosinophils Relative 0 %   Eosinophils Absolute 0.0 0 - 0.7 K/uL   Basophils Relative 0 %   Basophils Absolute 0.0 0 - 0.1 K/uL  Flow cytometry panel-leukemia/lymphoma work-up     Status: None   Collection Time: 09/05/17 11:50 AM   Result Value Ref Range   PATH INTERP XXX-IMP Comment     Comment: (NOTE) Increased kappa:lambda ratio of 4.5 detected [reference range 0.75-2.46], see comment    ANNOTATION COMMENT IMP Comment     Comment: (NOTE) Consider B cell genotyping, if clinically indicated, to detect a clonal B cell population.    CLINICAL INFO Comment     Comment: (NOTE) Neutrophilia Accompanying CBC dated 09-05-17 shows leukocytosis, neutrophilia, and lymphocytosis: WBC count 12.8, Neu 8.3, Lym 3.8, Mon 0.6.    Misc Source Comment     Comment: Peripheral blood   ASSESSMENT OF LEUKOCYTES Comment     Comment: (NOTE) The kappa to lambda ratio is increased. The B cells do not appear to co-express CD5 or CD10. No definitive monoclonal B cell population is detected. kappa:lambda ratio 4.5 There is no loss of, or aberrant expression of, the pan T cell antigens to suggest a neoplastic T cell process. CD4:CD8 ratio 2.3 No circulating blasts are detected. There is no immunophenotypic  evidence of abnormal myeloid maturation. Analysis of the leukocyte population shows: granulocytes 75%, monocytes 3%, lymphocytes 22%, blasts <0.5%, B cells 4%, T cells 16%, NK cells 2%.    % Viable Cells Comment     Comment: 97%   ANALYSIS AND GATING STRATEGY Comment     Comment: 8 color analysis with CD45/SSC   IMMUNOPHENOTYPING STUDY Comment     Comment: (NOTE) CD2       Normal         CD3       Normal CD4       Normal         CD5       Normal CD7       Normal         CD8       Normal CD10      Normal         CD11b     Normal CD13      Normal         CD14      Normal CD16  Normal         CD19      Normal CD20      Normal         CD33      Normal CD34      Normal         CD38      Normal CD45      Normal         CD56      Normal CD57      Normal         CD117     Normal HLA-DR    Normal         KAPPA     See Text LAMBDA    See Text       CD64      Normal    PATHOLOGIST NAME Comment     Comment: Lovett Sox, M.D.   COMMENT: Comment     Comment: (NOTE) Each antibody in this assay was utilized to assess for potential abnormalities of studied cell populations or to characterize identified abnormalities. This test was developed and its performance characteristics determined by [facility].  It has not been cleared or approved by the U.S. Food and Drug Administration. The FDA has determined that such clearance or approval is not necessary. This test is used for clinical purposes.  It should not be regarded as investigational or for research. Performed At: -[facility] [address] [place], [place] [id number] Nechama Guard MD JW:[id number] Performed At: [facility] [address] [place], [place] [id number] Nechama Guard MD QI:[id number]   Sedimentation rate     Status: Abnormal   Collection Time: 09/05/17 11:50 AM  Result Value Ref Range   Sed Rate 59 (H) 0 - 20 mm/hr  POCT HgB A1C     Status: Abnormal   Collection Time: 09/17/17  8:02 AM  Result Value Ref Range   Hemoglobin A1C 9.4       PHQ2/9: Depression screen [facility] 2/9 01/31/2017 12/19/2016 09/21/2016 07/20/2015 05/06/2015  Decreased Interest 0 0 0 0 1  Down, Depressed, Hopeless 0 0 0 0 1  PHQ - 2 Score 0 0 0 0 2  Altered sleeping - - - - 3  Tired, decreased energy - - - - 3  Change in appetite - - - - 3  Feeling bad or failure about yourself  - - - - 0  Moving slowly or fidgety/restless - - - - 1  Suicidal thoughts - - - - 0  PHQ-9 Score - - - - 12     Fall Risk: Fall Risk  09/17/2017 01/31/2017 12/19/2016 09/21/2016 07/20/2015  Falls in the past year? No Yes Yes No No  Number falls in past yr: - 1 1 - -  Injury with Fall? - Yes Yes - -  Comment - - Hurt her right shoulder - -     Assessment & Plan  1. Type 2 diabetes mellitus with other diabetic kidney complication, without long-term current use of insulin (HCC)  - POCT HgB A1C - Empagliflozin-Metformin HCl ER (SYNJARDY XR) 12.03-999 MG  TB24; Take 2 tablets daily by mouth.  Dispense: 60 tablet; Refill: 2 - fluconazole (DIFLUCAN) 150 MG tablet; Take 1 tablet (150 mg total) every other day by mouth.  Dispense: 3 tablet; Refill: 2 - Insulin Degludec-Liraglutide (XULTOPHY) 100-3.6 UNIT-MG/ML SOPN; Inject 20-50 Units daily into the skin.  Dispense: 9 mL; Refill: 2 - Insulin Pen Needle (  NOVOFINE) 30G X 8 MM MISC; Inject 10 each as needed into the skin.  Dispense: 100 each; Refill: 2  2. Vitamin D deficiency  - Vitamin D, Ergocalciferol, (DRISDOL) 50000 units CAPS capsule; Take 1 capsule (50,000 Units total) every 7 (seven) days by mouth.  Dispense: 12 capsule; Refill: 0  3. Dyslipidemia associated with type II DM  - atorvastatin (LIPITOR) 40 MG tablet; Take 1 tablet (40 mg total) daily by mouth.  Dispense: 30 tablet; Refill: 2  4. Extreme obesity  Discussed life style modification   5. 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 Central sleep apnea  Needs to start CPAP  7. Leukocytosis, unspecified type  Continue follow up with hematologist   8. Breast lump on left side at 1 o'clock position  Needs to go back to surgeon, missed the one month follow up

## 2017-09-17 NOTE — Progress Notes (Signed)
o

## 2017-09-19 ENCOUNTER — Encounter: Payer: Self-pay | Admitting: Oncology

## 2017-09-19 ENCOUNTER — Inpatient Hospital Stay (HOSPITAL_BASED_OUTPATIENT_CLINIC_OR_DEPARTMENT_OTHER): Payer: 59 | Admitting: Oncology

## 2017-09-19 ENCOUNTER — Encounter: Payer: Self-pay | Admitting: *Deleted

## 2017-09-19 VITALS — BP 129/86 | HR 106 | Temp 98.3°F | Resp 16 | Wt 318.0 lb

## 2017-09-19 DIAGNOSIS — D72829 Elevated white blood cell count, unspecified: Secondary | ICD-10-CM | POA: Diagnosis not present

## 2017-09-19 DIAGNOSIS — R718 Other abnormality of red blood cells: Secondary | ICD-10-CM

## 2017-09-19 DIAGNOSIS — D729 Disorder of white blood cells, unspecified: Secondary | ICD-10-CM

## 2017-09-19 NOTE — Progress Notes (Signed)
Patient here today for follow up with results. She states that she is feeling well today and denies having any pain.

## 2017-09-19 NOTE — Progress Notes (Signed)
Hematology/Oncology Consult note [facility]  Telephone:([phone] [phone]  Patient Care Team: Steele Sizer, MD as PCP - General Roque Cash, MD as Referring Physician (Psychiatry) [name], MD as Referring Physician (Obstetrics and Gynecology)   Name of the patient: Erin Good  [id number]  1976-09-27   Date of visit: 09/19/17  Diagnosis- leucocytosis likely reactive  Chief complaint/ Reason for visit- discuss results of bloodwork  Heme/Onc history: patient is a 41 year old female with a past medical history significant for obstructive sleep apnea and diabetes among other medical problems.  She also has a history of anxiety and depression and seizure disorder.  She has been referred to [place] for management of leukocytosis.  Of note patient's white count has been chronically elevated at least dating back to 2010.  Typically her white count ranges between 11-16.  Differential mainly shows neutrophilia.  Her hemoglobin has been normal between 12-13 with intermittent microcytosis.  Platelet count has been normal.  Patient denies any lumps or bumps anywhere.  Denies any fevers, chills, unintentional weight loss or drenching night sweats.  She denies any recurrent infections.  Her medication list has also been reviewed and she is currently not on any medications that can cause leukocytosis  Results of blood work from 09/05/2017 were as follows CBC showed white count of 12.8, H&H of 12.6/38.2 with a platelet count of 298.  Differential mainly showed neutrophilia and lymphocytosis.  Peripheral flow cytometry showed increased kappa lambda ratio.  B cells do not appear to coexpress CD5 or CD10.  No definitive monoclonal B-cell population is detected.  No loss of aberrant expression of pan T-cell antigens to suggest a neoplastic T-cell process.  ESR was elevated at 59.  Interval history- she feels well. Denies any complaints  ECOG PS- 0 Pain  scale- 0   Review of systems- Review of Systems  Constitutional: Negative for chills, fever, malaise/fatigue and weight loss.  HENT: Negative for congestion, ear discharge and nosebleeds.   Eyes: Negative for blurred vision.  Respiratory: Negative for cough, hemoptysis, sputum production, shortness of breath and wheezing.   Cardiovascular: Negative for chest pain, palpitations, orthopnea and claudication.  Gastrointestinal: Negative for abdominal pain, blood in stool, constipation, diarrhea, heartburn, melena, nausea and vomiting.  Genitourinary: Negative for dysuria, flank pain, frequency, hematuria and urgency.  Musculoskeletal: Negative for back pain, joint pain and myalgias.  Skin: Negative for rash.  Neurological: Negative for dizziness, tingling, focal weakness, seizures, weakness and headaches.  Endo/Heme/Allergies: Does not bruise/bleed easily.  Psychiatric/Behavioral: Negative for depression and suicidal ideas. The patient does not have insomnia.       Allergies  Allergen Reactions  . Peanuts [Peanut Oil] Anaphylaxis  . Aspirin Other (See Comments)    Does take because of her epilepsy/seizure      Past Medical History:  Diagnosis Date  . Allergic rhinitis   . Breast discharge 06/05/2017   2 weeks ago left  . Breast mass 12/06/2016   left  . Diabetes mellitus without complication ([place])   . Dyslipidemia   . Epilepsy ([place])   . Epilepsy ([place])   . Galactorrhea   . Hx gestational diabetes   . Kidney stones   . Obesity   . Seizures ([place])   . Sleep apnea   . Syncope and collapse   . Tachycardia      Past Surgical History:  Procedure Laterality Date  . CESAREAN SECTION     X 2  . RIGHT OOPHORECTOMY Right 2001  benign tumor  . TUBAL LIGATION  2007    Social History   Socioeconomic History  . Marital status: Married    Spouse name: Roderic Palau  . Number of children: 2  . Years of education: College  . Highest education level: Not on file  Social Needs  .  Financial resource strain: Not on file  . Food insecurity - worry: Not on file  . Food insecurity - inability: Not on file  . Transportation needs - medical: Not on file  . Transportation needs - non-medical: Not on file  Occupational History  . Occupation: Optometrist  Tobacco Use  . Smoking status: Never Smoker  . Smokeless tobacco: Never Used  Substance and Sexual Activity  . Alcohol use: No  . Drug use: No  . Sexual activity: Yes    Partners: Male    Birth control/protection: Surgical  Other Topics Concern  . Not on file  Social History Narrative  . Not on file    Family History  Problem Relation Age of Onset  . Diabetes Mother   . Breast cancer Paternal Grandmother 20  . Cancer Paternal Grandmother   . Cancer Paternal Aunt   . Cancer Maternal Grandmother   . Heart disease Neg Hx      Current Outpatient Medications:  .  atorvastatin (LIPITOR) 40 MG tablet, Take 1 tablet (40 mg total) daily by mouth., Disp: 30 tablet, Rfl: 2 .  Cholecalciferol (VITAMIN D3) 5000 units CAPS, Take 5,000 Units by mouth daily., Disp: , Rfl:  .  clonazePAM (KLONOPIN) 0.5 MG tablet, Take 0.5 mg by mouth only at the onset of a seizure, Disp: , Rfl: 3 .  Empagliflozin-Metformin HCl ER (SYNJARDY XR) 12.03-999 MG TB24, Take 2 tablets daily by mouth., Disp: 60 tablet, Rfl: 2 .  fluconazole (DIFLUCAN) 150 MG tablet, Take 1 tablet (150 mg total) every other day by mouth., Disp: 3 tablet, Rfl: 2 .  glucose blood (ONETOUCH VERIO) test strip, 1 strip by Does not apply route daily. Reported on 12/21/2015, Disp: , Rfl:  .  Insulin Degludec-Liraglutide (XULTOPHY) 100-3.6 UNIT-MG/ML SOPN, Inject 20-50 Units daily into the skin., Disp: 9 mL, Rfl: 2 .  Insulin Pen Needle (NOVOFINE) 30G X 8 MM MISC, Inject 10 each as needed into the skin., Disp: 100 each, Rfl: 2 .  traZODone (DESYREL) 100 MG tablet, Take 1 tablet (100 mg total) by mouth at bedtime as needed for sleep., Disp: 30 tablet, Rfl: 2 .  Vitamin D,  Ergocalciferol, (DRISDOL) 50000 units CAPS capsule, Take 1 capsule (50,000 Units total) every 7 (seven) days by mouth., Disp: 12 capsule, Rfl: 0 .  zonisamide (ZONEGRAN) 100 MG capsule, Take 500 mg by mouth at bedtime., Disp: , Rfl:  .  EPINEPHrine (EPIPEN 2-PAK) 0.3 mg/0.3 mL IJ SOAJ injection, Inject 0.3 mg into the muscle once as needed (for anaphylaxis). , Disp: , Rfl:   Physical exam:  Vitals:   09/19/17 1506  BP: 129/86  Pulse: (!) 106  Resp: 16  Temp: 98.3 F (36.8 C)  TempSrc: Tympanic  Weight: (!) 318 lb (144.2 kg)   Physical Exam  Constitutional: She is oriented to person, place, and time.  Obese. Appears in no acute distress  HENT:  Head: Normocephalic and atraumatic.  Eyes: EOM are normal. Pupils are equal, round, and reactive to light.  Neck: Normal range of motion.  Cardiovascular: Normal rate, regular rhythm and normal heart sounds.  Pulmonary/Chest: Effort normal and breath sounds normal.  Abdominal: Soft. Bowel sounds  are normal.  Neurological: She is alert and oriented to person, place, and time.  Skin: Skin is warm and dry.     CMP Latest Ref Rng & Units 06/13/2017  Glucose 65 - 99 mg/dL 301(H)  BUN 7 - 25 mg/dL 8  Creatinine 0.50 - 1.10 mg/dL 0.60  Sodium 135 - 146 mmol/L 136  Potassium 3.5 - 5.3 mmol/L 4.1  Chloride 98 - 110 mmol/L 103  CO2 20 - 32 mmol/L 21  Calcium 8.6 - 10.2 mg/dL 9.2  Total Protein 6.1 - 8.1 g/dL 7.1  Total Bilirubin 0.2 - 1.2 mg/dL 0.4  Alkaline Phos 33 - 115 U/L 108  AST 10 - 30 U/L 12  ALT 6 - 29 U/L 17   CBC Latest Ref Rng & Units 09/05/2017  WBC 3.6 - 11.0 K/uL 12.8(H)  Hemoglobin 12.0 - 16.0 g/dL 12.6  Hematocrit 35.0 - 47.0 % 38.2  Platelets 150 - 440 K/uL 298     Assessment and plan- Patient is a 41 y.o. female referred for leukocytosis likely reactive  Patient has had chronic leukocytosis dating back to 2010.  Differential is mainly showed neutrophilia and lymphocytosis.  Her white count waxes and wanes but has  stayed stable overall between 11-15.  Peripheral soap flow cytometry did not reveal any evidence of lymphoma leukemia.  This is likely reactive and does not need any further hematology workup at this time.  Patient can continue to follow-up with her primary care provider.  Patient does have microcytosis without overt anemia which has again been chronic.  She probably has a component of iron deficiency without overt anemia and would benefit from oral iron but this can be followed up by primary care doctor as well   Visit Diagnosis 1. Neutrophilia   2. Microcytosis      Dr. Randa Evens, MD, MPH [facility] at [facility] Pager- [phone] 09/19/2017 4:28 PM

## 2017-09-20 ENCOUNTER — Other Ambulatory Visit: Payer: Self-pay

## 2017-09-20 NOTE — Telephone Encounter (Signed)
Patient Insurance did not cover Xultophy alternative was [place].

## 2017-09-22 MED ORDER — INSULIN PEN NEEDLE 32G X 6 MM MISC: 1.0000 | Freq: Every day | 0 refills | Status: DC

## 2017-09-22 MED ORDER — INSULIN GLARGINE-LIXISENATIDE 100-33 UNT-MCG/ML ~~LOC~~ SOPN: 15.0000 [IU] | PEN_INJECTOR | Freq: Every day | SUBCUTANEOUS | 0 refills | Status: DC

## 2017-09-24 ENCOUNTER — Other Ambulatory Visit: Payer: Self-pay

## 2017-09-24 NOTE — Telephone Encounter (Signed)
Pharmacy faxed [place] a paper stating they need a new prescription with the dispense quantity being 15 ml because this is the way this medication comes. Please send this in. Thanks.

## 2017-09-25 MED ORDER — INSULIN GLARGINE-LIXISENATIDE 100-33 UNT-MCG/ML ~~LOC~~ SOPN: 15.0000 [IU] | PEN_INJECTOR | Freq: Every day | SUBCUTANEOUS | 0 refills | Status: DC

## 2017-10-04 DIAGNOSIS — G4733 Obstructive sleep apnea (adult) (pediatric): Secondary | ICD-10-CM | POA: Diagnosis not present

## 2017-10-24 DIAGNOSIS — J329 Chronic sinusitis, unspecified: Secondary | ICD-10-CM | POA: Diagnosis not present

## 2017-11-03 DIAGNOSIS — B356 Tinea cruris: Secondary | ICD-10-CM | POA: Diagnosis not present

## 2017-12-02 DIAGNOSIS — L739 Follicular disorder, unspecified: Secondary | ICD-10-CM | POA: Diagnosis not present

## 2017-12-02 DIAGNOSIS — E119 Type 2 diabetes mellitus without complications: Secondary | ICD-10-CM | POA: Diagnosis not present

## 2017-12-02 DIAGNOSIS — Z794 Long term (current) use of insulin: Secondary | ICD-10-CM | POA: Diagnosis not present

## 2017-12-03 ENCOUNTER — Other Ambulatory Visit: Payer: Self-pay

## 2017-12-03 ENCOUNTER — Encounter (HOSPITAL_COMMUNITY): Payer: Self-pay | Admitting: Emergency Medicine

## 2017-12-03 DIAGNOSIS — L02415 Cutaneous abscess of right lower limb: Secondary | ICD-10-CM | POA: Insufficient documentation

## 2017-12-03 DIAGNOSIS — E119 Type 2 diabetes mellitus without complications: Secondary | ICD-10-CM | POA: Insufficient documentation

## 2017-12-03 DIAGNOSIS — Z794 Long term (current) use of insulin: Secondary | ICD-10-CM | POA: Insufficient documentation

## 2017-12-03 LAB — CBC
HEMATOCRIT: 37.5 % (ref 36.0–46.0)
Hemoglobin: 12.6 g/dL (ref 12.0–15.0)
MCH: 26.4 pg (ref 26.0–34.0)
MCHC: 33.6 g/dL (ref 30.0–36.0)
MCV: 78.6 fL (ref 78.0–100.0)
PLATELETS: 300 10*3/uL (ref 150–400)
RBC: 4.77 MIL/uL (ref 3.87–5.11)
RDW: 13.5 % (ref 11.5–15.5)
WBC: 16.6 10*3/uL — ABNORMAL HIGH (ref 4.0–10.5)

## 2017-12-03 LAB — I-STAT BETA HCG BLOOD, ED (MC, WL, AP ONLY): I-stat hCG, quantitative: <5 m[IU]/mL (ref <5)

## 2017-12-03 LAB — BASIC METABOLIC PANEL
Anion gap: 10 (ref 5–15)
BUN: 10 mg/dL (ref 6–20)
CHLORIDE: 103 mmol/L (ref 101–111)
CO2: 22 mmol/L (ref 22–32)
CREATININE: 0.66 mg/dL (ref 0.44–1.00)
Calcium: 9.4 mg/dL (ref 8.9–10.3)
GFR calc Af Amer: >60 mL/min (ref >60)
GFR calc non Af Amer: >60 mL/min (ref >60)
Glucose, Bld: 273 mg/dL — ABNORMAL HIGH (ref 65–99)
POTASSIUM: 3.6 mmol/L (ref 3.5–5.1)
Sodium: 135 mmol/L (ref 135–145)

## 2017-12-03 LAB — URINALYSIS, ROUTINE W REFLEX MICROSCOPIC
Bilirubin Urine: NEGATIVE
Hgb urine dipstick: NEGATIVE
KETONES UR: NEGATIVE mg/dL
LEUKOCYTES UA: NEGATIVE
Nitrite: NEGATIVE
Protein, ur: NEGATIVE mg/dL
Specific Gravity, Urine: 1.021 (ref 1.005–1.030)
pH: 6 (ref 5.0–8.0)

## 2017-12-03 LAB — CBG MONITORING, ED: GLUCOSE-CAPILLARY: 248 mg/dL — AB (ref 65–99)

## 2017-12-03 NOTE — ED Triage Notes (Signed)
Pt reports abscess to RLE onset a few days ago. Approx size of golfball, red, swollen. Pt is diabetic, states sugars have been running higher than normal.

## 2017-12-04 ENCOUNTER — Emergency Department (HOSPITAL_COMMUNITY)
Admission: EM | Admit: 2017-12-04 | Discharge: 2017-12-04 | Disposition: A | Discharge status: Home / Self Care | Payer: 59 | Attending: Emergency Medicine | Admitting: Emergency Medicine

## 2017-12-04 DIAGNOSIS — L0291 Cutaneous abscess, unspecified: Secondary | ICD-10-CM

## 2017-12-04 MED ORDER — LIDOCAINE-EPINEPHRINE-TETRACAINE (LET) SOLUTION
3.0000 mL | Freq: Once | NASAL | Status: AC
  Administered 2017-12-04: 02:00:00 3 mL via TOPICAL
  Filled 2017-12-04: qty 3

## 2017-12-04 MED ORDER — CLINDAMYCIN HCL 150 MG PO CAPS: 300.0000 mg | ORAL_CAPSULE | Freq: Three times a day (TID) | ORAL | 0 refills | Status: DC

## 2017-12-04 MED ORDER — LIDOCAINE HCL (PF) 1 % IJ SOLN
5.0000 mL | Freq: Once | INTRAMUSCULAR | Status: AC
  Administered 2017-12-04: 5 mL via INTRADERMAL
  Filled 2017-12-04: qty 5

## 2017-12-04 MED ORDER — OXYCODONE-ACETAMINOPHEN 5-325 MG PO TABS
1.0000 | ORAL_TABLET | Freq: Once | ORAL | Status: AC
  Administered 2017-12-04: 1 via ORAL
  Filled 2017-12-04: qty 1

## 2017-12-04 MED ORDER — TRAMADOL HCL 50 MG PO TABS: 50.0000 mg | ORAL_TABLET | Freq: Four times a day (QID) | ORAL | 0 refills | Status: DC | PRN

## 2017-12-04 NOTE — ED Provider Notes (Signed)
[facility] EMERGENCY DEPARTMENT Provider Note   CSN: [id number] Arrival date & time: 12/03/17  2233     History   Chief Complaint Chief Complaint  Patient presents with  . Abscess    HPI Erin Good is a 42 y.o. female.  The history is provided by the patient and medical records.  Abscess     42 year old female with history of allergic kidney stones, seizures, sleep apnea, presenting to the ED with abscess of right lower leg.  Reports she noticed this about 1 week ago but area has become increasingly more red and swollen since then.  Reports increased pain, especially with weightbearing and ambulation but states she did have a small wound on her left leg but that seems to have healed up at this time.  She does not remember any specific cut, bug bite, or wound of the right leg that caused abscess.  She has no history of HIV or MRSA.  She does not have any history of nonhealing diabetic wounds.  Denies any fever or chills.  She has not tried any medications prior to arrival.  Past Medical History:  Diagnosis Date  . Allergic rhinitis   . Breast discharge 06/05/2017   2 weeks ago left  . Breast mass 12/06/2016   left  . Diabetes mellitus without complication ([place])   . Dyslipidemia   . Epilepsy ([place])   . Epilepsy ([place])   . Galactorrhea   . Hx gestational diabetes   . Kidney stones   . Obesity   . Seizures ([place])   . Sleep apnea   . Syncope and collapse   . Tachycardia     Patient Active Problem List   Diagnosis Date Noted  . Vitamin D deficiency 01/31/2017  . Right shoulder tendinitis 01/31/2017  . Tendinosis 01/31/2017  . Bell's palsy 12/19/2016  . Tachycardia 09/30/2016  . Depression with anxiety 05/06/2015  . Allergic rhinitis 04/20/2015  . Anxiety and depression 04/20/2015  . 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 Gastro-esophageal reflux disease without esophagitis 04/20/2015  . Dysmetabolic syndrome 20/94/7096  . Extreme  obesity 04/20/2015  . NASH (nonalcoholic steatohepatitis) 04/20/2015  . Allergy to nuts 04/20/2015  . Calculus of kidney 04/20/2015  . Type 2 diabetes mellitus with renal manifestations ([place]) 04/20/2015  . Central sleep apnea 11/26/2008  . Dyslipidemia 07/02/2008  . Leukocytosis 07/29/2007    Past Surgical History:  Procedure Laterality Date  . CESAREAN SECTION     X 2  . RIGHT OOPHORECTOMY Right 2001   benign tumor  . TUBAL LIGATION  2007    OB History    Gravida Para Term Preterm AB Living   2 2           SAB TAB Ectopic Multiple Live Births                  Obstetric Comments   Menstrual age: 69  Age 1st Pregnancy: 60         Home Medications    Prior to Admission medications   Medication Sig Start Date End Date Taking? Authorizing Provider  clonazePAM (KLONOPIN) 0.5 MG tablet Take 0.5 mg by mouth only at the onset of a seizure 08/14/16  Yes [provider]  Empagliflozin-Metformin HCl ER (SYNJARDY XR) 12.03-999 MG TB24 Take 2 tablets daily by mouth. 09/17/17  Yes Sowles, Drue Stager, MD  EPINEPHrine (EPIPEN 2-PAK) 0.3 mg/0.3 mL IJ SOAJ injection Inject 0.3 mg into the muscle once as needed (  for anaphylaxis).  06/14/14  Yes [provider]  Insulin Glargine-Lixisenatide (SOLIQUA) 100-33 UNT-MCG/ML SOPN Inject 15-60 Units into the skin daily. Patient taking differently: Inject 28 Units into the skin daily.  09/25/17  Yes Sowles, Drue Stager, MD  traZODone (DESYREL) 100 MG tablet Take 1 tablet (100 mg total) by mouth at bedtime as needed for sleep. 06/13/17  Yes Sowles, Drue Stager, MD  Vitamin D, Ergocalciferol, (DRISDOL) 50000 units CAPS capsule Take 1 capsule (50,000 Units total) every 7 (seven) days by mouth. 09/17/17  Yes Sowles, Drue Stager, MD  zonisamide (ZONEGRAN) 100 MG capsule Take 500 mg by mouth at bedtime.   Yes Roque Cash, MD  atorvastatin (LIPITOR) 40 MG tablet Take 1 tablet (40 mg total) daily by mouth. Patient not taking: Reported on 12/04/2017  09/17/17   Steele Sizer, MD  fluconazole (DIFLUCAN) 150 MG tablet Take 1 tablet (150 mg total) every other day by mouth. Patient not taking: Reported on 12/04/2017 09/17/17   Steele Sizer, MD  glucose blood (ONETOUCH VERIO) test strip 1 strip by Does not apply route daily. Reported on 12/21/2015 10/19/13   [provider]  Insulin Pen Needle (NOVOFINE) 32G X 6 MM MISC 1 each daily by Does not apply route. 09/22/17   Steele Sizer, MD    Family History Family History  Problem Relation Age of Onset  . Diabetes Mother   . Breast cancer Paternal Grandmother 42  . Cancer Paternal Grandmother   . Cancer Paternal Aunt   . Cancer Maternal Grandmother   . Heart disease Neg Hx     Social History Social History   Tobacco Use  . Smoking status: Never Smoker  . Smokeless tobacco: Never Used  Substance Use Topics  . Alcohol use: No  . Drug use: No     Allergies   Peanuts [peanut oil] and Aspirin   Review of Systems Review of Systems  Skin: Positive for wound (abscess).  All other systems reviewed and are negative.    Physical Exam Updated Vital Signs BP 117/66 (BP Location: Right Arm)   Pulse 82   Temp 98.4 F (36.9 C) (Oral)   Resp 16   Ht 5' 6"  (1.676 m)   Wt (!) 145.2 kg (320 lb)   SpO2 99%   BMI 51.65 kg/m   Physical Exam  Constitutional: She is oriented to person, place, and time. She appears well-developed and well-nourished.  HENT:  Head: Normocephalic and atraumatic.  Mouth/Throat: Oropharynx is clear and moist.  Eyes: Conjunctivae and EOM are normal. Pupils are equal, round, and reactive to light.  Neck: Normal range of motion.  Cardiovascular: Normal rate, regular rhythm and normal heart sounds.  Pulmonary/Chest: Effort normal and breath sounds normal. No stridor. No respiratory distress.  Abdominal: Soft. Bowel sounds are normal. There is no tenderness. There is no rebound.  Musculoskeletal: Normal range of motion.  Abscess of RLL approx  2x1cm with 2cm erythema surrounding; locally tender; no streaking of the leg; compartments soft/compressible; DP pulses intact bilaterally No chronic appearing wounds or the legs or feet  Neurological: She is alert and oriented to person, place, and time.  Skin: Skin is warm and dry.  Psychiatric: She has a normal mood and affect.  Nursing note and vitals reviewed.    ED Treatments / Results  Labs (all labs ordered are listed, but only abnormal results are displayed) Labs Reviewed  BASIC METABOLIC PANEL - Abnormal; Notable for the following components:      Result Value   Glucose,  Bld 273 (*)    All other components within normal limits  CBC - Abnormal; Notable for the following components:   WBC 16.6 (*)    All other components within normal limits  URINALYSIS, ROUTINE W REFLEX MICROSCOPIC - Abnormal; Notable for the following components:   APPearance HAZY (*)    Glucose, UA >=500 (*)    Bacteria, UA RARE (*)    Squamous Epithelial / LPF 0-5 (*)    All other components within normal limits  CBG MONITORING, ED - Abnormal; Notable for the following components:   Glucose-Capillary 248 (*)    All other components within normal limits  I-STAT BETA HCG BLOOD, ED ([facility], [facility], [facility] ONLY)    EKG  EKG Interpretation None       Radiology No results found.  Procedures Procedures (including critical care time)  INCISION AND DRAINAGE Performed by: Larene Pickett Consent: Verbal consent obtained. Risks and benefits: risks, benefits and alternatives were discussed Type: abscess  Body area: right lower leg  Anesthesia: local infiltration  Incision was made with a scalpel.  Local anesthetic: lidocaine 1% without epinephrine, LET  Anesthetic total: 4 ml  Complexity: complex Blunt dissection to break up loculations  Drainage: purulent  Drainage amount: moderate  Packing material: none  Patient tolerance: Patient tolerated the procedure well with no immediate  complications.     Medications Ordered in ED Medications  oxyCODONE-acetaminophen (PERCOCET/ROXICET) 5-325 MG per tablet 1 tablet (not administered)  lidocaine-EPINEPHrine-tetracaine (LET) solution (not administered)  lidocaine (PF) (XYLOCAINE) 1 % injection 5 mL (not administered)     Initial Impression / Assessment and Plan / ED Course  I have reviewed the triage vital signs and the nursing notes.  Pertinent labs & imaging results that were available during my care of the patient were reviewed by me and considered in my medical decision making (see chart for details).  42 year old female who with abscess of right lower leg.  Has been present for 1 week but worsening since onset.  Has known diabetes but no history of HIV or MRSA.  She is afebrile and nontoxic.  Abscess is a 2 x 1 cm with 2 cm surrounding erythema.  Central fluctuance noted but no active drainage.  No streaking up the leg.  Compartments are soft and easily compressible.  Screening labs overall reassuring.  Does have some hyperglycemia but no evidence of DKA.  I&D performed as above, patient tolerated well.  Discussed home wound care.  Will start on clindamycin and short course pain medication.  She will follow-up with her primary care doctor for wound check in a few days.  Discussed plan with patient, she acknowledged understanding and agreed with plan of care.  Return precautions given for new or worsening symptoms.  Final Clinical Impressions(s) / ED Diagnoses   Final diagnoses:  Abscess    ED Discharge Orders        Ordered    clindamycin (CLEOCIN) 150 MG capsule  3 times daily     12/04/17 0225    traMADol (ULTRAM) 50 MG tablet  Every 6 hours PRN     12/04/17 0225       Larene Pickett, PA-C 12/04/17 0254    Ezequiel Essex, MD 12/04/17 [phone]

## 2017-12-04 NOTE — ED Notes (Signed)
LET applied at skin abscess at right shin.

## 2017-12-04 NOTE — Discharge Instructions (Signed)
Keep an eye on your sugars at home as they can run a little high with concurrent infection. Follow-up with your primary care doctor for wound re-check in a few days. Return here for any new/acute changes.

## 2017-12-04 NOTE — ED Notes (Signed)
Pt understood dc material. NAD noted. Scripts given at dc 

## 2017-12-18 ENCOUNTER — Ambulatory Visit (INDEPENDENT_AMBULATORY_CARE_PROVIDER_SITE_OTHER): Payer: 59 | Admitting: Family Medicine

## 2017-12-18 ENCOUNTER — Encounter: Payer: Self-pay | Admitting: Family Medicine

## 2017-12-18 VITALS — BP 122/80 | HR 99 | Resp 14 | Ht 66.0 in | Wt 314.7 lb

## 2017-12-18 DIAGNOSIS — R809 Proteinuria, unspecified: Secondary | ICD-10-CM

## 2017-12-18 DIAGNOSIS — G4731 Primary central sleep apnea: Secondary | ICD-10-CM

## 2017-12-18 DIAGNOSIS — Z794 Long term (current) use of insulin: Secondary | ICD-10-CM | POA: Diagnosis not present

## 2017-12-18 DIAGNOSIS — E668 Other obesity: Secondary | ICD-10-CM

## 2017-12-18 DIAGNOSIS — E1169 Type 2 diabetes mellitus with other specified complication: Secondary | ICD-10-CM | POA: Diagnosis not present

## 2017-12-18 DIAGNOSIS — E669 Obesity, unspecified: Secondary | ICD-10-CM

## 2017-12-18 DIAGNOSIS — E1129 Type 2 diabetes mellitus with other diabetic kidney complication: Secondary | ICD-10-CM | POA: Diagnosis not present

## 2017-12-18 DIAGNOSIS — E785 Hyperlipidemia, unspecified: Secondary | ICD-10-CM

## 2017-12-18 DIAGNOSIS — G40409 Other generalized epilepsy and epileptic syndromes, not intractable, without status epilepticus: Secondary | ICD-10-CM | POA: Diagnosis not present

## 2017-12-18 DIAGNOSIS — K7581 Nonalcoholic steatohepatitis (NASH): Secondary | ICD-10-CM

## 2017-12-18 LAB — POCT UA - MICROALBUMIN: Microalbumin Ur, POC: 20 mg/L

## 2017-12-18 LAB — POCT GLYCOSYLATED HEMOGLOBIN (HGB A1C): Hemoglobin A1C: 9.8

## 2017-12-18 MED ORDER — SEMAGLUTIDE(0.25 OR 0.5MG/DOS) 2 MG/1.5ML ~~LOC~~ SOPN: 0.5000 mg | PEN_INJECTOR | SUBCUTANEOUS | 2 refills | Status: DC

## 2017-12-18 MED ORDER — INSULIN DEGLUDEC 200 UNIT/ML ~~LOC~~ SOPN: 20.0000 [IU] | PEN_INJECTOR | Freq: Every day | SUBCUTANEOUS | 0 refills | Status: DC

## 2017-12-18 MED ORDER — FREESTYLE LIBRE 14 DAY SENSOR MISC: 1.0000 | 5 refills | Status: DC

## 2017-12-18 MED ORDER — ATORVASTATIN CALCIUM 40 MG PO TABS: 40.0000 mg | ORAL_TABLET | Freq: Every day | ORAL | 2 refills | Status: DC

## 2017-12-18 MED ORDER — VITAMIN D 1000 UNITS PO TABS: 1000.0000 [IU] | ORAL_TABLET | Freq: Every day | ORAL | 0 refills | Status: DC

## 2017-12-18 MED ORDER — FREESTYLE LIBRE 14 DAY READER DEVI: 1.0000 | Freq: Every day | 1 refills | Status: DC

## 2017-12-18 MED ORDER — METFORMIN HCL ER 750 MG PO TB24: 750.0000 mg | ORAL_TABLET | Freq: Every day | ORAL | 2 refills | Status: DC

## 2017-12-18 NOTE — Progress Notes (Signed)
Name: Erin Good   MRN: [id number]    DOB: 1976/06/14   Date:12/18/2017       Progress Note  Subjective  Chief Complaint  Chief Complaint  Patient presents with  . Diabetes  . Hyperlipidemia    HPI  DMII: she was seen in Feb 2018 with Bell's palsy and was not using any of her medications at the time. hgbA1C 9.9  10.3 , 10.9%, 10.4%  9.4% and today 9.8%. she was on [place] but insurance stopped covering, it worked best for her to increase compliance, we will try another PA, she did not respond to previous regiment of Trulicity and orals, currently on Solyqua but glucose is trending up, she is only on 26 units - states fasting has been around 120 and is terrified of going up on the dose ( mother died of insulin overdose). She had to stop Synjardi because of severe yeast infection. We will try Tyler Aas, better monitor so she can feel reassured about her glucose levels and add Ozempic weekly and metformin. Follow up in 4-6 weeks with sugar log.  She denies polyphagia, has polydipsia but no polyuria.   OSA: she just got a new mask, she has been wearing CPAP 3-4 times a week. She states sometimes she forgets to put it on.  Right shoulder impingement syndrome: started August 2017. She had a seizure that caused her to fall and injury her right shoulder. She was seen by Cassie Freer , had MRI and advised to have PT, however unable to keep appointments because of her work schedule and cost. She had a steroid injection without much improvement and has been seeing a chiropractor ( [facility] ), only having pain at night now, she is doing okay  Seizure disorder: she sees Dr. Lysle Rubens, had one seizure  Summer 2017, she is doing well since. Taking medication as prescribed. She is on higher dose of medication since episode, she finished rx vitamin D and is currently taking otc supplementation   Obesity: weight is stable,  she states she eats smaller portions, she is trying to  avoid carbohydrates. Drinking more water,  Diet sodas only, she has been cooking at home.   Leukocytosis: elevated, she saw hematologist.   Abscess  right leg: she developed reddens , pain and swelling of right leg, she had ID 12/03/2017 at the local [facility], she was given clindamycin and is doing well, mild swelling locally, mild local tenderness no drainage.  Patient Active Problem List   Diagnosis Date Noted  . Vitamin D deficiency 01/31/2017  . Right shoulder tendinitis 01/31/2017  . Tendinosis 01/31/2017  . Bell's palsy 12/19/2016  . Tachycardia 09/30/2016  . Depression with anxiety 05/06/2015  . Allergic rhinitis 04/20/2015  . Anxiety and depression 04/20/2015  . 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 Gastro-esophageal reflux disease without esophagitis 04/20/2015  . Dysmetabolic syndrome 40/98/1191  . Extreme obesity 04/20/2015  . NASH (nonalcoholic steatohepatitis) 04/20/2015  . Allergy to nuts 04/20/2015  . Calculus of kidney 04/20/2015  . Type 2 diabetes mellitus with renal manifestations ([place]) 04/20/2015  . Central sleep apnea 11/26/2008  . Dyslipidemia 07/02/2008  . Leukocytosis 07/29/2007    Past Surgical History:  Procedure Laterality Date  . CESAREAN SECTION     X 2  . RIGHT OOPHORECTOMY Right 2001   benign tumor  . TUBAL LIGATION  2007    Family History  Problem Relation Age of Onset  . Diabetes Mother   . Breast cancer Paternal  Grandmother 60  . Cancer Paternal Grandmother   . Cancer Paternal Aunt   . Cancer Maternal Grandmother   . Heart disease Neg Hx     Social History   Socioeconomic History  . Marital status: Married    Spouse name: Roderic Palau  . Number of children: 2  . Years of education: College  . Highest education level: Not on file  Social Needs  . Financial resource strain: Not on file  . Food insecurity - worry: Not on file  . Food insecurity - inability: Not on file  . Transportation needs - medical: Not on file  .  Transportation needs - non-medical: Not on file  Occupational History  . Occupation: Optometrist  Tobacco Use  . Smoking status: Never Smoker  . Smokeless tobacco: Never Used  Substance and Sexual Activity  . Alcohol use: No  . Drug use: No  . Sexual activity: Yes    Partners: Male    Birth control/protection: Surgical  Other Topics Concern  . Not on file  Social History Narrative  . Not on file     Current Outpatient Medications:  .  atorvastatin (LIPITOR) 40 MG tablet, Take 1 tablet (40 mg total) by mouth daily., Disp: 30 tablet, Rfl: 2 .  clindamycin (CLEOCIN) 150 MG capsule, Take 2 capsules (300 mg total) by mouth 3 (three) times daily. May dispense as 174m capsules, Disp: 60 capsule, Rfl: 0 .  clonazePAM (KLONOPIN) 0.5 MG tablet, Take 0.5 mg by mouth only at the onset of a seizure, Disp: , Rfl: 3 .  EPINEPHrine (EPIPEN 2-PAK) 0.3 mg/0.3 mL IJ SOAJ injection, Inject 0.3 mg into the muscle once as needed (for anaphylaxis). , Disp: , Rfl:  .  Insulin Pen Needle (NOVOFINE) 32G X 6 MM MISC, 1 each daily by Does not apply route., Disp: 100 each, Rfl: 0 .  zonisamide (ZONEGRAN) 100 MG capsule, Take 500 mg by mouth at bedtime., Disp: , Rfl:  .  cholecalciferol (VITAMIN D) 1000 units tablet, Take 1 tablet (1,000 Units total) by mouth daily., Disp: 30 tablet, Rfl: 0 .  Continuous Blood Gluc Receiver (FREESTYLE LIBRE 14 DAY READER) DEVI, 1 each by Does not apply route daily., Disp: 1 Device, Rfl: 1 .  Continuous Blood Gluc Sensor (FREESTYLE LIBRE 14 DAY SENSOR) MISC, 1 each by Does not apply route every 14 (fourteen) days., Disp: 2 each, Rfl: 5 .  Insulin Degludec (TRESIBA FLEXTOUCH) 200 UNIT/ML SOPN, Inject 20 Units into the skin daily., Disp: 9 mL, Rfl: 0 .  metFORMIN (GLUCOPHAGE XR) 750 MG 24 hr tablet, Take 1 tablet (750 mg total) by mouth daily with breakfast., Disp: 60 tablet, Rfl: 2 .  Semaglutide (OZEMPIC) 0.25 or 0.5 MG/DOSE SOPN, Inject 0.5 mg into the skin once a week., Disp: 3  mL, Rfl: 2  Allergies  Allergen Reactions  . Peanuts [Peanut Oil] Anaphylaxis  . 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  Respiratory: Negative for cough and shortness of breath.   Cardiovascular: Negative for chest pain or palpitations.  Gastrointestinal: Negative for abdominal pain, no bowel changes.  Musculoskeletal: Negative for gait problem or joint swelling.  Skin: healing abscess right leg  Neurological: Negative for dizziness or headache.  No other specific complaints in a complete review of systems (except as listed in HPI above).  Objective  Vitals:   12/18/17 0750  BP: 122/80  Pulse: 99  Resp: 14  SpO2: 98%  Weight: (!) 314 lb 11.2 oz (142.7 kg)  Height: 5' 6" (1.676 m)    Body mass index is 50.79 kg/m.  Physical Exam  Constitutional: Patient appears well-developed and well-nourished. Obese No distress.  HEENT: head atraumatic, normocephalic, pupils equal and reactive to light,neck supple, throat within normal limits Cardiovascular: Normal rate, regular rhythm and normal heart sounds.  No murmur heard. No BLE edema. Pulmonary/Chest: Effort normal and breath sounds normal. No respiratory distress. Abdominal: Soft.  There is no tenderness. Psychiatric: Patient has a normal mood and affect. behavior is normal. Judgment and thought content normal.  Recent Results (from the past 2160 hour(s))  CBG monitoring, ED     Status: Abnormal   Collection Time: 12/03/17 10:53 PM  Result Value Ref Range   Glucose-Capillary 248 (H) 65 - 99 mg/dL   Comment 1 Notify RN    Comment 2 Document in Chart   Basic metabolic panel     Status: Abnormal   Collection Time: 12/03/17 10:53 PM  Result Value Ref Range   Sodium 135 135 - 145 mmol/L   Potassium 3.6 3.5 - 5.1 mmol/L   Chloride 103 101 - 111 mmol/L   CO2 22 22 - 32 mmol/L   Glucose, Bld 273 (H) 65 - 99 mg/dL   BUN 10 6 -  20 mg/dL   Creatinine, Ser 0.66 0.44 - 1.00 mg/dL   Calcium 9.4 8.9 - 10.3 mg/dL   GFR calc non Af Amer >60 >60 mL/min   GFR calc Af Amer >60 >60 mL/min    Comment: (NOTE) The eGFR has been calculated using the CKD EPI equation. This calculation has not been validated in all clinical situations. eGFR's persistently <60 mL/min signify possible Chronic Kidney Disease.    Anion gap 10 5 - 15  CBC     Status: Abnormal   Collection Time: 12/03/17 10:53 PM  Result Value Ref Range   WBC 16.6 (H) 4.0 - 10.5 K/uL   RBC 4.77 3.87 - 5.11 MIL/uL   Hemoglobin 12.6 12.0 - 15.0 g/dL   HCT 37.5 36.0 - 46.0 %   MCV 78.6 78.0 - 100.0 fL   MCH 26.4 26.0 - 34.0 pg   MCHC 33.6 30.0 - 36.0 g/dL   RDW 13.5 11.5 - 15.5 %   Platelets 300 150 - 400 K/uL  Urinalysis, Routine w reflex microscopic     Status: Abnormal   Collection Time: 12/03/17 11:00 PM  Result Value Ref Range   Color, Urine YELLOW YELLOW   APPearance HAZY (A) CLEAR   Specific Gravity, Urine 1.021 1.005 - 1.030   pH 6.0 5.0 - 8.0   Glucose, UA >=500 (A) NEGATIVE mg/dL   Hgb urine dipstick NEGATIVE NEGATIVE   Bilirubin Urine NEGATIVE NEGATIVE   Ketones, ur NEGATIVE NEGATIVE mg/dL   Protein, ur NEGATIVE NEGATIVE mg/dL   Nitrite NEGATIVE NEGATIVE   Leukocytes, UA NEGATIVE NEGATIVE   RBC / HPF 0-5 0 - 5 RBC/hpf   WBC, UA 0-5 0 - 5 WBC/hpf   Bacteria, UA RARE (A) NONE SEEN   Squamous Epithelial / LPF 0-5 (A) NONE SEEN  I-Stat beta hCG blood, ED     Status: None   Collection Time: 12/03/17 11:07 PM  Result Value Ref Range   I-stat hCG, quantitative <5.0 <5 mIU/mL   Comment 3            Comment:   GEST. AGE  CONC.  (mIU/mL)   <=1 WEEK        5 - 50     2 WEEKS       50 - 500     3 WEEKS       100 - 10,000     4 WEEKS     1,000 - 30,000        FEMALE AND NON-PREGNANT FEMALE:     LESS THAN 5 mIU/mL   POCT HgB A1C     Status: Abnormal   Collection Time: 12/18/17  8:04 AM  Result Value Ref Range   Hemoglobin A1C 9.8   POCT UA  - Microalbumin     Status: Abnormal   Collection Time: 12/18/17  8:04 AM  Result Value Ref Range   Microalbumin Ur, POC 20 mg/L   Creatinine, POC  mg/dL   Albumin/Creatinine Ratio, Urine, POC      Diabetic Foot Exam: Diabetic Foot Exam - Simple   Simple Foot Form Diabetic Foot exam was performed with the following findings:  Yes 12/18/2017  8:29 AM  Visual Inspection No deformities, no ulcerations, no other skin breakdown bilaterally:  Yes Sensation Testing Intact to touch and monofilament testing bilaterally:  Yes Pulse Check Posterior Tibialis and Dorsalis pulse intact bilaterally:  Yes Comments     PHQ2/9: Depression screen [facility] 2/9 01/31/2017 12/19/2016 09/21/2016 07/20/2015 05/06/2015  Decreased Interest 0 0 0 0 1  Down, Depressed, Hopeless 0 0 0 0 1  PHQ - 2 Score 0 0 0 0 2  Altered sleeping - - - - 3  Tired, decreased energy - - - - 3  Change in appetite - - - - 3  Feeling bad or failure about yourself  - - - - 0  Moving slowly or fidgety/restless - - - - 1  Suicidal thoughts - - - - 0  PHQ-9 Score - - - - 12     Fall Risk: Fall Risk  12/18/2017 09/17/2017 01/31/2017 12/19/2016 09/21/2016  Falls in the past year? No No Yes Yes No  Number falls in past yr: - - 1 1 -  Injury with Fall? - - Yes Yes -  Comment - - - Hurt her right shoulder -    Functional Status Survey: Is the patient deaf or have difficulty hearing?: No Does the patient have difficulty seeing, even when wearing glasses/contacts?: No Does the patient have difficulty concentrating, remembering, or making decisions?: No Does the patient have difficulty walking or climbing stairs?: No Does the patient have difficulty dressing or bathing?: No Does the patient have difficulty doing errands alone such as visiting a doctor's office or shopping?: No    Assessment & Plan  1. Type 2 diabetes mellitus with microalbuminuria, with long-term current use of insulin (HCC)  - POCT HgB A1C - POCT UA -  Microalbumin - Insulin Degludec (TRESIBA FLEXTOUCH) 200 UNIT/ML SOPN; Inject 20 Units into the skin daily.  Dispense: 9 mL; Refill: 0 - Semaglutide (OZEMPIC) 0.25 or 0.5 MG/DOSE SOPN; Inject 0.5 mg into the skin once a week.  Dispense: 3 mL; Refill: 2 - metFORMIN (GLUCOPHAGE XR) 750 MG 24 hr tablet; Take 1 tablet (750 mg total) by mouth daily with breakfast.  Dispense: 60 tablet; Refill: 2 - Continuous Blood Gluc Receiver (FREESTYLE LIBRE 14 DAY READER) DEVI; 1 each by Does not apply route daily.  Dispense: 1 Device; Refill: 1 - Continuous Blood Gluc Sensor (FREESTYLE LIBRE 14 DAY SENSOR) MISC; 1 each by Does not  apply route every 14 (fourteen) days.  Dispense: 2 each; Refill: 5  2. Extreme obesity  Discussed with the patient the risk posed by an increased BMI. Discussed importance of portion control, calorie counting and at least 150 minutes of physical activity weekly. Avoid sweet beverages and drink more water. Eat at least 6 servings of fruit and vegetables daily  Start Ozempic, hopefully that will help with weight loss  3. 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 Dyslipidemia associated with type 2 diabetes mellitus (HCC)  - Insulin Degludec (TRESIBA FLEXTOUCH) 200 UNIT/ML SOPN; Inject 20 Units into the skin daily.  Dispense: 9 mL; Refill: 0 - Semaglutide (OZEMPIC) 0.25 or 0.5 MG/DOSE SOPN; Inject 0.5 mg into the skin once a week.  Dispense: 3 mL; Refill: 2 - atorvastatin (LIPITOR) 40 MG tablet; Take 1 tablet (40 mg total) by mouth daily.  Dispense: 30 tablet; Refill: 2 - metFORMIN (GLUCOPHAGE XR) 750 MG 24 hr tablet; Take 1 tablet (750 mg total) by mouth daily with breakfast.  Dispense: 60 tablet; Refill: 2 - Continuous Blood Gluc Receiver (FREESTYLE LIBRE 14 DAY READER) DEVI; 1 each by Does not apply route daily.  Dispense: 1 Device; Refill: 1 - Continuous Blood Gluc Sensor (FREESTYLE LIBRE 14 DAY SENSOR) MISC; 1 each by Does not apply route every 14 (fourteen) days.  Dispense: 2 each;  Refill: 5  5. Central sleep apnea  Discussed importance of using CPAP every night   6. NASH (nonalcoholic steatohepatitis)  Needs to lose weight   7. Dyslipidemia  - atorvastatin (LIPITOR) 40 MG tablet; Take 1 tablet (40 mg total) by mouth daily.  Dispense: 30 tablet; Refill: 2  

## 2017-12-24 DIAGNOSIS — Z719 Counseling, unspecified: Secondary | ICD-10-CM | POA: Diagnosis not present

## 2018-01-10 DIAGNOSIS — Z719 Counseling, unspecified: Secondary | ICD-10-CM | POA: Diagnosis not present

## 2018-01-17 DIAGNOSIS — Z719 Counseling, unspecified: Secondary | ICD-10-CM | POA: Diagnosis not present

## 2018-01-28 ENCOUNTER — Ambulatory Visit (INDEPENDENT_AMBULATORY_CARE_PROVIDER_SITE_OTHER): Payer: 59 | Admitting: Family Medicine

## 2018-01-28 ENCOUNTER — Encounter: Payer: Self-pay | Admitting: Family Medicine

## 2018-01-28 VITALS — BP 116/72 | HR 112 | Temp 98.4°F | Resp 18 | Ht 66.0 in | Wt 318.9 lb

## 2018-01-28 DIAGNOSIS — E785 Hyperlipidemia, unspecified: Secondary | ICD-10-CM | POA: Diagnosis not present

## 2018-01-28 DIAGNOSIS — E1169 Type 2 diabetes mellitus with other specified complication: Secondary | ICD-10-CM | POA: Diagnosis not present

## 2018-01-28 DIAGNOSIS — E1129 Type 2 diabetes mellitus with other diabetic kidney complication: Secondary | ICD-10-CM | POA: Diagnosis not present

## 2018-01-28 DIAGNOSIS — J301 Allergic rhinitis due to pollen: Secondary | ICD-10-CM

## 2018-01-28 DIAGNOSIS — R809 Proteinuria, unspecified: Secondary | ICD-10-CM | POA: Diagnosis not present

## 2018-01-28 DIAGNOSIS — Z794 Long term (current) use of insulin: Secondary | ICD-10-CM

## 2018-01-28 MED ORDER — LORCASERIN HCL ER 20 MG PO TB24: 1.0000 | ORAL_TABLET | Freq: Every day | ORAL | 2 refills | Status: DC

## 2018-01-28 MED ORDER — FREESTYLE LIBRE 14 DAY READER DEVI: 1.0000 | Freq: Every day | 1 refills | Status: DC

## 2018-01-28 MED ORDER — MONTELUKAST SODIUM 10 MG PO TABS: 10.0000 mg | ORAL_TABLET | Freq: Every day | ORAL | 2 refills | Status: DC

## 2018-01-28 MED ORDER — LEVOCETIRIZINE DIHYDROCHLORIDE 5 MG PO TABS: 5.0000 mg | ORAL_TABLET | Freq: Every evening | ORAL | 2 refills | Status: DC

## 2018-01-28 MED ORDER — INSULIN DEGLUDEC 200 UNIT/ML ~~LOC~~ SOPN: 20.0000 [IU] | PEN_INJECTOR | Freq: Every day | SUBCUTANEOUS | 1 refills | Status: DC

## 2018-01-28 NOTE — Progress Notes (Signed)
Name: Erin Good   MRN: [id number]    DOB: [birth year]   Date:01/28/2018       Progress Note  Subjective  Chief Complaint  Chief Complaint  Patient presents with  . Follow-up  . Diabetes    Patient Insurance did not cover the [employer], [place] [employer]. Can't tell a difference with new medication  . URI    Onset-2 weeks, sinus drainage, dry cough and will sometimes have a color phlegm, headache, bilateral ear pressure. Patient has tried Advil Sinus and Mucinex Max    HPI  DM II: she is using Ozempic weekly, but has not increased dose of [place] yet, explained that she needs to titrate dose up, she states fasting around 170 , but no longer above 200 all the time. She denies polyphagia, but she has polydipsia.   Obesity: she has been cooking at home, logging her calories, eating between 1100 -1500 calories per day. Eating more salads. She would like to try medication, we discussed options and we will try Belviq  AR: she has seasonal allergies, noticed nasal congestion, sneezing and facial pressure, no fever or chills. She has been taking advil sinus and mucinex, we will try xyzal, does not like nasal spray   Patient Active Problem List   Diagnosis Date Noted  . Vitamin D deficiency 01/31/2017  . Right shoulder tendinitis 01/31/2017  . Tendinosis 01/31/2017  . Bell's palsy 12/19/2016  . Tachycardia 09/30/2016  . Depression with anxiety 05/06/2015  . Allergic rhinitis 04/20/2015  . Anxiety and depression 04/20/2015  . 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 Gastro-esophageal reflux disease without esophagitis 04/20/2015  . Dysmetabolic syndrome 00/76/2263  . Extreme obesity 04/20/2015  . NASH (nonalcoholic steatohepatitis) 04/20/2015  . Allergy to nuts 04/20/2015  . Calculus of kidney 04/20/2015  . Type 2 diabetes mellitus with renal manifestations ([place]) 04/20/2015  . Central sleep apnea 11/26/2008  . Dyslipidemia 07/02/2008  .  Leukocytosis 07/29/2007    Past Surgical History:  Procedure Laterality Date  . CESAREAN SECTION     X 2  . RIGHT OOPHORECTOMY Right 2001   benign tumor  . TUBAL LIGATION  2007    Family History  Problem Relation Age of Onset  . Diabetes Mother   . Breast cancer Paternal Grandmother 22  . Cancer Paternal Grandmother   . Cancer Paternal Aunt   . Cancer Maternal Grandmother   . Heart disease Neg Hx     Social History   Socioeconomic History  . Marital status: Married    Spouse name: Roderic Palau  . Number of children: 2  . Years of education: College  . Highest education level: Not on file  Occupational History  . Occupation: Optometrist  Social Needs  . Financial resource strain: Not on file  . Food insecurity:    Worry: Not on file    Inability: Not on file  . Transportation needs:    Medical: Not on file    Non-medical: Not on file  Tobacco Use  . Smoking status: Never Smoker  . Smokeless tobacco: Never Used  Substance and Sexual Activity  . Alcohol use: No  . Drug use: No  . Sexual activity: Yes    Partners: Male    Birth control/protection: Surgical  Lifestyle  . Physical activity:    Days per week: Not on file    Minutes per session: Not on file  . Stress: Not on file  Relationships  . Social connections:    Talks  on phone: Not on file    Gets together: Not on file    Attends religious service: Not on file    Active member of club or organization: Not on file    Attends meetings of clubs or organizations: Not on file    Relationship status: Not on file  . Intimate partner violence:    Fear of current or ex partner: Not on file    Emotionally abused: Not on file    Physically abused: Not on file    Forced sexual activity: Not on file  Other Topics Concern  . Not on file  Social History Narrative  . Not on file     Current Outpatient Medications:  .  atorvastatin (LIPITOR) 40 MG tablet, Take 1 tablet (40 mg total) by mouth daily., Disp: 30  tablet, Rfl: 2 .  cholecalciferol (VITAMIN D) 1000 units tablet, Take 1 tablet (1,000 Units total) by mouth daily., Disp: 30 tablet, Rfl: 0 .  clindamycin (CLEOCIN) 150 MG capsule, Take 2 capsules (300 mg total) by mouth 3 (three) times daily. May dispense as 179m capsules, Disp: 60 capsule, Rfl: 0 .  clonazePAM (KLONOPIN) 0.5 MG tablet, Take 0.5 mg by mouth only at the onset of a seizure, Disp: , Rfl: 3 .  EPINEPHrine (EPIPEN 2-PAK) 0.3 mg/0.3 mL IJ SOAJ injection, Inject 0.3 mg into the muscle once as needed (for anaphylaxis). , Disp: , Rfl:  .  Insulin Degludec (TRESIBA FLEXTOUCH) 200 UNIT/ML SOPN, Inject 20 Units into the skin daily., Disp: 9 mL, Rfl: 0 .  Insulin Pen Needle (NOVOFINE) 32G X 6 MM MISC, 1 each daily by Does not apply route., Disp: 100 each, Rfl: 0 .  metFORMIN (GLUCOPHAGE XR) 750 MG 24 hr tablet, Take 1 tablet (750 mg total) by mouth daily with breakfast., Disp: 60 tablet, Rfl: 2 .  Semaglutide (OZEMPIC) 0.25 or 0.5 MG/DOSE SOPN, Inject 0.5 mg into the skin once a week., Disp: 3 mL, Rfl: 2 .  zonisamide (ZONEGRAN) 100 MG capsule, Take 500 mg by mouth at bedtime., Disp: , Rfl:  .  Continuous Blood Gluc Receiver (FREESTYLE LIBRE 14 DAY READER) DEVI, 1 each by Does not apply route daily. (Patient not taking: Reported on 01/28/2018), Disp: 1 Device, Rfl: 1 .  Continuous Blood Gluc Sensor (FREESTYLE LIBRE 14 DAY SENSOR) MISC, 1 each by Does not apply route every 14 (fourteen) days. (Patient not taking: Reported on 01/28/2018), Disp: 2 each, Rfl: 5  Allergies  Allergen Reactions  . Peanuts [Peanut Oil] Anaphylaxis  . 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  Respiratory: Positive  for cough but no  shortness of breath.   Cardiovascular: Negative for chest pain or palpitations.  Gastrointestinal: Negative for abdominal pain, no bowel changes.  Musculoskeletal: Negative for gait  problem or joint swelling.  Skin: Negative for rash.  Neurological: Negative for dizziness or headache.  No other specific complaints in a complete review of systems (except as listed in HPI above).   Objective  Vitals:   01/28/18 1451  BP: 116/72  Pulse: (!) 112  Resp: 18  Temp: 98.4 F (36.9 C)  TempSrc: Oral  SpO2: 97%  Weight: (!) 318 lb 14.4 oz (144.7 kg)  Height: 5' 6"  (1.676 m)    Body mass index is 51.47 kg/m.  Physical Exam  Constitutional: Patient appears well-developed and well-nourished. Obese No distress.  HEENT: head atraumatic, normocephalic, pupils equal and reactive to light, neck supple, throat within normal limits Cardiovascular: Normal rate, regular rhythm and normal heart sounds.  No murmur heard. No BLE edema. Pulmonary/Chest: Effort normal and breath sounds normal. No respiratory distress. Abdominal: Soft.  There is no tenderness. Psychiatric: Patient has a normal mood and affect. behavior is normal. Judgment and thought content normal.  Recent Results (from the past 2160 hour(s))  CBG monitoring, ED     Status: Abnormal   Collection Time: 12/03/17 10:53 PM  Result Value Ref Range   Glucose-Capillary 248 (H) 65 - 99 mg/dL   Comment 1 Notify RN    Comment 2 Document in Chart   Basic metabolic panel     Status: Abnormal   Collection Time: 12/03/17 10:53 PM  Result Value Ref Range   Sodium 135 135 - 145 mmol/L   Potassium 3.6 3.5 - 5.1 mmol/L   Chloride 103 101 - 111 mmol/L   CO2 22 22 - 32 mmol/L   Glucose, Bld 273 (H) 65 - 99 mg/dL   BUN 10 6 - 20 mg/dL   Creatinine, Ser 0.66 0.44 - 1.00 mg/dL   Calcium 9.4 8.9 - 10.3 mg/dL   GFR calc non Af Amer >60 >60 mL/min   GFR calc Af Amer >60 >60 mL/min    Comment: (NOTE) The eGFR has been calculated using the CKD EPI equation. This calculation has not been validated in all clinical situations. eGFR's persistently <60 mL/min signify possible Chronic Kidney Disease.    Anion gap 10 5 - 15  CBC      Status: Abnormal   Collection Time: 12/03/17 10:53 PM  Result Value Ref Range   WBC 16.6 (H) 4.0 - 10.5 K/uL   RBC 4.77 3.87 - 5.11 MIL/uL   Hemoglobin 12.6 12.0 - 15.0 g/dL   HCT 37.5 36.0 - 46.0 %   MCV 78.6 78.0 - 100.0 fL   MCH 26.4 26.0 - 34.0 pg   MCHC 33.6 30.0 - 36.0 g/dL   RDW 13.5 11.5 - 15.5 %   Platelets 300 150 - 400 K/uL  Urinalysis, Routine w reflex microscopic     Status: Abnormal   Collection Time: 12/03/17 11:00 PM  Result Value Ref Range   Color, Urine YELLOW YELLOW   APPearance HAZY (A) CLEAR   Specific Gravity, Urine 1.021 1.005 - 1.030   pH 6.0 5.0 - 8.0   Glucose, UA >=500 (A) NEGATIVE mg/dL   Hgb urine dipstick NEGATIVE NEGATIVE   Bilirubin Urine NEGATIVE NEGATIVE   Ketones, ur NEGATIVE NEGATIVE mg/dL   Protein, ur NEGATIVE NEGATIVE mg/dL   Nitrite NEGATIVE NEGATIVE   Leukocytes, UA NEGATIVE NEGATIVE   RBC / HPF 0-5 0 - 5 RBC/hpf   WBC, UA 0-5 0 - 5 WBC/hpf   Bacteria, UA RARE (A) NONE SEEN   Squamous Epithelial / LPF 0-5 (A) NONE SEEN  I-Stat beta hCG blood, ED     Status: None   Collection Time: 12/03/17 11:07 PM  Result Value Ref Range   I-stat hCG, quantitative <5.0 <5 mIU/mL   Comment 3            Comment:   GEST. AGE      CONC.  (mIU/mL)   <=1 WEEK        5 - 50     2 WEEKS       50 - 500     3 WEEKS  100 - 10,000     4 WEEKS     1,000 - 30,000        FEMALE AND NON-PREGNANT FEMALE:     LESS THAN 5 mIU/mL   POCT HgB A1C     Status: Abnormal   Collection Time: 12/18/17  8:04 AM  Result Value Ref Range   Hemoglobin A1C 9.8   POCT UA - Microalbumin     Status: Abnormal   Collection Time: 12/18/17  8:04 AM  Result Value Ref Range   Microalbumin Ur, POC 20 mg/L   Creatinine, POC  mg/dL   Albumin/Creatinine Ratio, Urine, POC       PHQ2/9: Depression screen [facility] 2/9 01/31/2017 12/19/2016 09/21/2016 07/20/2015 05/06/2015  Decreased Interest 0 0 0 0 1  Down, Depressed, Hopeless 0 0 0 0 1  PHQ - 2 Score 0 0 0 0 2  Altered sleeping - - -  - 3  Tired, decreased energy - - - - 3  Change in appetite - - - - 3  Feeling bad or failure about yourself  - - - - 0  Moving slowly or fidgety/restless - - - - 1  Suicidal thoughts - - - - 0  PHQ-9 Score - - - - 12     Fall Risk: Fall Risk  12/18/2017 09/17/2017 01/31/2017 12/19/2016 09/21/2016  Falls in the past year? No No Yes Yes No  Number falls in past yr: - - 1 1 -  Injury with Fall? - - Yes Yes -  Comment - - - Hurt her right shoulder -    Assessment & Plan  1. Type 2 diabetes mellitus with microalbuminuria, with long-term current use of insulin (HCC)  - Insulin Degludec (TRESIBA FLEXTOUCH) 200 UNIT/ML SOPN; Inject 20-50 Units into the skin daily.  Dispense: 9 mL; Refill: 1 - Continuous Blood Gluc Receiver (FREESTYLE LIBRE 14 DAY READER) DEVI; 1 each by Does not apply route daily.  Dispense: 1 Device; Refill: 1  2. Seasonal allergic rhinitis due to pollen  - levocetirizine (XYZAL) 5 MG tablet; Take 1 tablet (5 mg total) by mouth every evening.  Dispense: 30 tablet; Refill: 2  - singulair ( to help with cough)   3. Dyslipidemia associated with type 2 diabetes mellitus (HCC)  - Insulin Degludec (TRESIBA FLEXTOUCH) 200 UNIT/ML SOPN; Inject 20-50 Units into the skin daily.  Dispense: 9 mL; Refill: 1 - Continuous Blood Gluc Receiver (FREESTYLE LIBRE 14 DAY READER) DEVI; 1 each by Does not apply route daily.  Dispense: 1 Device; Refill: 1  4. Morbid obesity (HCC)  - Lorcaserin HCl ER (BELVIQ XR) 20 MG TB24; Take 1 tablet by mouth daily.  Dispense: 30 tablet; Refill: 2

## 2018-01-28 NOTE — Patient Instructions (Addendum)
ozempic  -once a week medication - regulates sugar after meals Tresiba - daily - regulates morning sugar You need to go up by 2 units every 2 days on Tresiba, to a max of 50 units to keep sugar in the morning between 100-140

## 2018-02-10 DIAGNOSIS — J019 Acute sinusitis, unspecified: Secondary | ICD-10-CM | POA: Diagnosis not present

## 2018-02-10 DIAGNOSIS — R05 Cough: Secondary | ICD-10-CM | POA: Diagnosis not present

## 2018-03-31 ENCOUNTER — Emergency Department (HOSPITAL_COMMUNITY): Payer: 59

## 2018-03-31 ENCOUNTER — Emergency Department (HOSPITAL_COMMUNITY)
Admission: EM | Admit: 2018-03-31 | Discharge: 2018-03-31 | Disposition: A | Discharge status: Home / Self Care | Payer: 59 | Attending: Emergency Medicine | Admitting: Emergency Medicine

## 2018-03-31 ENCOUNTER — Encounter (HOSPITAL_COMMUNITY): Payer: Self-pay

## 2018-03-31 DIAGNOSIS — R1031 Right lower quadrant pain: Secondary | ICD-10-CM | POA: Diagnosis present

## 2018-03-31 DIAGNOSIS — Z794 Long term (current) use of insulin: Secondary | ICD-10-CM | POA: Diagnosis not present

## 2018-03-31 DIAGNOSIS — N7011 Chronic salpingitis: Secondary | ICD-10-CM

## 2018-03-31 DIAGNOSIS — E118 Type 2 diabetes mellitus with unspecified complications: Secondary | ICD-10-CM | POA: Insufficient documentation

## 2018-03-31 DIAGNOSIS — D259 Leiomyoma of uterus, unspecified: Secondary | ICD-10-CM | POA: Diagnosis not present

## 2018-03-31 DIAGNOSIS — Z79899 Other long term (current) drug therapy: Secondary | ICD-10-CM | POA: Insufficient documentation

## 2018-03-31 DIAGNOSIS — B379 Candidiasis, unspecified: Secondary | ICD-10-CM | POA: Diagnosis not present

## 2018-03-31 DIAGNOSIS — N390 Urinary tract infection, site not specified: Secondary | ICD-10-CM | POA: Diagnosis not present

## 2018-03-31 DIAGNOSIS — R109 Unspecified abdominal pain: Secondary | ICD-10-CM | POA: Diagnosis not present

## 2018-03-31 LAB — CBC
HEMATOCRIT: 39.4 % (ref 36.0–46.0)
Hemoglobin: 12.6 g/dL (ref 12.0–15.0)
MCH: 25.5 pg — AB (ref 26.0–34.0)
MCHC: 32 g/dL (ref 30.0–36.0)
MCV: 79.6 fL (ref 78.0–100.0)
PLATELETS: 331 10*3/uL (ref 150–400)
RBC: 4.95 MIL/uL (ref 3.87–5.11)
RDW: 13.1 % (ref 11.5–15.5)
WBC: 16.7 10*3/uL — ABNORMAL HIGH (ref 4.0–10.5)

## 2018-03-31 LAB — URINALYSIS, ROUTINE W REFLEX MICROSCOPIC
BILIRUBIN URINE: NEGATIVE
Glucose, UA: >=500 mg/dL — AB
Hgb urine dipstick: NEGATIVE
Ketones, ur: 5 mg/dL — AB
Nitrite: NEGATIVE
PH: 6 (ref 5.0–8.0)
Protein, ur: NEGATIVE mg/dL
SPECIFIC GRAVITY, URINE: 1.027 (ref 1.005–1.030)

## 2018-03-31 LAB — COMPREHENSIVE METABOLIC PANEL
ALBUMIN: 3.5 g/dL (ref 3.5–5.0)
ALT: 19 U/L (ref 14–54)
AST: 17 U/L (ref 15–41)
Alkaline Phosphatase: 102 U/L (ref 38–126)
Anion gap: 8 (ref 5–15)
BUN: 5 mg/dL — AB (ref 6–20)
CHLORIDE: 101 mmol/L (ref 101–111)
CO2: 24 mmol/L (ref 22–32)
CREATININE: 0.65 mg/dL (ref 0.44–1.00)
Calcium: 9.3 mg/dL (ref 8.9–10.3)
GFR calc Af Amer: >60 mL/min (ref >60)
GFR calc non Af Amer: >60 mL/min (ref >60)
GLUCOSE: 319 mg/dL — AB (ref 65–99)
POTASSIUM: 4.2 mmol/L (ref 3.5–5.1)
SODIUM: 133 mmol/L — AB (ref 135–145)
Total Bilirubin: 0.5 mg/dL (ref 0.3–1.2)
Total Protein: 7.7 g/dL (ref 6.5–8.1)

## 2018-03-31 LAB — I-STAT BETA HCG BLOOD, ED (MC, WL, AP ONLY)

## 2018-03-31 LAB — WET PREP, GENITAL
CLUE CELLS WET PREP: NONE SEEN
Sperm: NONE SEEN
Trich, Wet Prep: NONE SEEN

## 2018-03-31 LAB — CBG MONITORING, ED: Glucose-Capillary: 205 mg/dL — ABNORMAL HIGH (ref 65–99)

## 2018-03-31 LAB — LIPASE, BLOOD: LIPASE: 28 U/L (ref 11–51)

## 2018-03-31 MED ORDER — FLUCONAZOLE 150 MG PO TABS
150.0000 mg | ORAL_TABLET | Freq: Once | ORAL | Status: AC
  Administered 2018-03-31: 150 mg via ORAL
  Filled 2018-03-31: qty 1

## 2018-03-31 MED ORDER — SODIUM CHLORIDE 0.9 % IV BOLUS
1000.0000 mL | Freq: Once | INTRAVENOUS | Status: AC
Start: 2018-03-31 — End: 2018-03-31
  Administered 2018-03-31: 1000 mL via INTRAVENOUS

## 2018-03-31 MED ORDER — ONDANSETRON HCL 4 MG/2ML IJ SOLN
4.0000 mg | Freq: Once | INTRAMUSCULAR | Status: AC
  Administered 2018-03-31: 4 mg via INTRAVENOUS
  Filled 2018-03-31: qty 2

## 2018-03-31 MED ORDER — STERILE WATER FOR INJECTION IJ SOLN
INTRAMUSCULAR | Status: AC
Start: 2018-03-31 — End: 2018-03-31
  Administered 2018-03-31: 1 mL
  Filled 2018-03-31: qty 10

## 2018-03-31 MED ORDER — CEFTRIAXONE SODIUM 250 MG IJ SOLR
250.0000 mg | Freq: Once | INTRAMUSCULAR | Status: AC
  Administered 2018-03-31: 250 mg via INTRAMUSCULAR
  Filled 2018-03-31: qty 250

## 2018-03-31 MED ORDER — MORPHINE SULFATE (PF) 4 MG/ML IV SOLN
4.0000 mg | Freq: Once | INTRAVENOUS | Status: AC
  Administered 2018-03-31: 4 mg via INTRAVENOUS
  Filled 2018-03-31: qty 1

## 2018-03-31 MED ORDER — IOHEXOL 300 MG/ML  SOLN
100.0000 mL | Freq: Once | INTRAMUSCULAR | Status: AC | PRN
  Administered 2018-03-31: 100 mL via INTRAVENOUS

## 2018-03-31 MED ORDER — AZITHROMYCIN 250 MG PO TABS
1000.0000 mg | ORAL_TABLET | Freq: Once | ORAL | Status: AC
  Administered 2018-03-31: 1000 mg via ORAL
  Filled 2018-03-31: qty 4

## 2018-03-31 MED ORDER — CEPHALEXIN 500 MG PO CAPS: 500.0000 mg | ORAL_CAPSULE | Freq: Four times a day (QID) | ORAL | 0 refills | Status: AC

## 2018-03-31 NOTE — Discharge Instructions (Addendum)
You were given a prescription for antibiotics. Please take the antibiotic prescription fully.   I have prescribed a new medication for you today. It is important that when you pick the prescription up you discuss the potential interactions of this medication with other medications you are taking, including over the counter medications, with the pharmacists.   This new medication has potential side effects. Be sure to contact your primary care provider or return to the emergency department if you are experiencing new symptoms that you are unable to tolerate after starting the medication. You need to receive medical evaluation immediately if you start to experience blistering of the skin, rash, swelling, or difficulty breathing as these signs could indicate a more serious medication side effect.   Please follow up with your primary care provider tomorrow as scheduled for re-evaluation of your symptoms. Please follow up with your OB-GYN for further evaluation of your hydrosalpinx.  Please return to the ER sooner if you have any new or worsening symptoms, or if you have any of the following symptoms:  Abdominal pain that does not go away.  You have a fever.  You keep throwing up (vomiting).  The pain is felt only in portions of the abdomen. Pain in the right side could possibly be appendicitis. In an adult, pain in the left lower portion of the abdomen could be colitis or diverticulitis.  You pass bloody or black tarry stools.  There is bright red blood in the stool.  The constipation stays for more than 4 days.  There is belly (abdominal) or rectal pain.  You do not seem to be getting better.  You have any questions or concerns.

## 2018-03-31 NOTE — ED Notes (Signed)
Patient transported to [facility] 

## 2018-03-31 NOTE — ED Provider Notes (Signed)
[facility]ERGENCY DEPARTMENT Provider Note   CSN: [id number] Arrival date & time: 03/31/18  1150     History   Chief Complaint Chief Complaint  Patient presents with  . Abdominal Pain    HPI Erin Good is a 42 y.o. female.  HPI   Patient is a 42 year old female with history of T2 DM, kidney stones, hyperlipidemia who presents the emergency department today complaining of right lower quadrant abdominal pain which has been constant for the last 5 to 6 days.  She states the pain has worsened since onset and is severe in nature.  It radiates to the right flank and into the right groin area down the lateral portion of her thigh.  States pain feels like a pressure.  She has not tried any medications for her symptoms.  She endorses chills, subjective fevers, nausea and decreased appetite, but denies any vomiting, diarrhea, constipation, blood in her stool or urine, dysuria, hematuria, frequency, urgency, vaginal bleeding, vaginal discharge, vaginal irritation.  She denies any concern for STD as she is currently married and monogamous with her husband.  She does state that she has some dyspareunia with intercourse 2 weeks ago.  She also states she has a history of left oophorectomy after she had "grapefruit sized cyst "on the ovary.  She states that her pain feels somewhat similar to when she had the cyst on her ovary previously.  States the symptoms are not consistent with when she has had kidney stones previously.  Patient also states that her blood sugars have been elevated in the 300s all week which is abnormal for her. Denies unilateral leg swelling or erythema. No h/o DVT/PE. No h/o CA. No recent surgeries, hospitalizations, or extended periods of travel.   Past Medical History:  Diagnosis Date  . Allergic rhinitis   . Breast discharge 06/05/2017   2 weeks ago left  . Breast mass 12/06/2016   left  . Diabetes mellitus without complication ([place])   .  Dyslipidemia   . Epilepsy ([place])   . Epilepsy ([place])   . Galactorrhea   . Hx gestational diabetes   . Kidney stones   . Obesity   . Seizures ([place])   . Sleep apnea   . Syncope and collapse   . Tachycardia     Patient Active Problem List   Diagnosis Date Noted  . Vitamin D deficiency 01/31/2017  . Right shoulder tendinitis 01/31/2017  . Tendinosis 01/31/2017  . Bell's palsy 12/19/2016  . Tachycardia 09/30/2016  . Depression with anxiety 05/06/2015  . Allergic rhinitis 04/20/2015  . Anxiety and depression 04/20/2015  . 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 Gastro-esophageal reflux disease without esophagitis 04/20/2015  . Dysmetabolic syndrome 11/13/3233  . Extreme obesity 04/20/2015  . NASH (nonalcoholic steatohepatitis) 04/20/2015  . Allergy to nuts 04/20/2015  . Calculus of kidney 04/20/2015  . Type 2 diabetes mellitus with renal manifestations ([place]) 04/20/2015  . Central sleep apnea 11/26/2008  . Dyslipidemia 07/02/2008  . Leukocytosis 07/29/2007    Past Surgical History:  Procedure Laterality Date  . CESAREAN SECTION     X 2  . RIGHT OOPHORECTOMY Right 2001   benign tumor  . TUBAL LIGATION  2007     OB History    Gravida  2   Para  2   Term      Preterm      AB      Living        SAB  TAB      Ectopic      Multiple      Live Births           Obstetric Comments  Menstrual age: 49  Age 1st Pregnancy: 15           Home Medications    Prior to Admission medications   Medication Sig Start Date End Date Taking? Authorizing Provider  clonazePAM (KLONOPIN) 0.5 MG tablet Take 0.5 mg by mouth only at the onset of a seizure 08/14/16  Yes [provider]  EPINEPHrine (EPIPEN 2-PAK) 0.3 mg/0.3 mL IJ SOAJ injection Inject 0.3 mg into the muscle once as needed (for anaphylaxis).  06/14/14  Yes [provider]  fluticasone (FLONASE) 50 MCG/ACT nasal spray Place 2 sprays into both nostrils daily as needed. 01/28/18   Yes [provider]  Insulin Degludec (TRESIBA FLEXTOUCH) 200 UNIT/ML SOPN Inject 20-50 Units into the skin daily. Patient taking differently: Inject 20-50 Units into the skin daily. Per sliding scale 01/28/18  Yes Sowles, Drue Stager, MD  Insulin Glargine-Lixisenatide (SOLIQUA) 100-33 UNT-MCG/ML SOPN Inject 22 Units into the skin daily.   Yes [provider]  levocetirizine (XYZAL) 5 MG tablet Take 1 tablet (5 mg total) by mouth every evening. Patient taking differently: Take 5 mg by mouth daily as needed for allergies.  01/28/18  Yes Sowles, Drue Stager, MD  montelukast (SINGULAIR) 10 MG tablet Take 1 tablet (10 mg total) by mouth at bedtime. 01/28/18  Yes Steele Sizer, MD  OVER THE COUNTER MEDICATION Take 1 packet by mouth daily. The Nature Made Diabetes Health Pack (Multivitamin, Fish Oil, Vitamin D3, Magnesium,Vitamin C, Alpha Lipoic Acid, Chromium)   Yes [provider]  zonisamide (ZONEGRAN) 100 MG capsule Take 400 mg by mouth at bedtime.    Yes Roque Cash, MD  atorvastatin (LIPITOR) 40 MG tablet Take 1 tablet (40 mg total) by mouth daily. Patient not taking: Reported on 03/31/2018 12/18/17   Steele Sizer, MD  cephALEXin (KEFLEX) 500 MG capsule Take 1 capsule (500 mg total) by mouth 4 (four) times daily for 7 days. 03/31/18 04/07/18  Breeanne Oblinger S, PA-C  cholecalciferol (VITAMIN D) 1000 units tablet Take 1 tablet (1,000 Units total) by mouth daily. Patient not taking: Reported on 03/31/2018 12/18/17   Steele Sizer, MD  Continuous Blood Gluc Receiver (FREESTYLE LIBRE 14 DAY READER) DEVI 1 each by Does not apply route daily. 01/28/18   Steele Sizer, MD  Continuous Blood Gluc Sensor (FREESTYLE LIBRE 14 DAY SENSOR) MISC 1 each by Does not apply route every 14 (fourteen) days. 12/18/17   Steele Sizer, MD  Insulin Pen Needle (NOVOFINE) 32G X 6 MM MISC 1 each daily by Does not apply route. 09/22/17   Steele Sizer, MD  Lorcaserin HCl ER (BELVIQ XR) 20 MG TB24 Take 1  tablet by mouth daily. 01/28/18   Steele Sizer, MD  metFORMIN (GLUCOPHAGE XR) 750 MG 24 hr tablet Take 1 tablet (750 mg total) by mouth daily with breakfast. Patient not taking: Reported on 03/31/2018 12/18/17   Steele Sizer, MD  Semaglutide (OZEMPIC) 0.25 or 0.5 MG/DOSE SOPN Inject 0.5 mg into the skin once a week. Patient not taking: Reported on 03/31/2018 12/18/17   Steele Sizer, MD    Family History Family History  Problem Relation Age of Onset  . Diabetes Mother   . Breast cancer Paternal Grandmother 7  . Cancer Paternal Grandmother   . Cancer Paternal Aunt   . Cancer Maternal Grandmother   .  Heart disease Neg Hx     Social History Social History   Tobacco Use  . Smoking status: Never Smoker  . Smokeless tobacco: Never Used  Substance Use Topics  . Alcohol use: No  . Drug use: No     Allergies   Peanuts [peanut oil] and Aspirin   Review of Systems Review of Systems  Constitutional: Positive for appetite change, chills and fever.  HENT: Negative for congestion, ear pain, rhinorrhea and sore throat.   Eyes: Negative for pain and visual disturbance.  Respiratory: Negative for cough and shortness of breath.   Cardiovascular: Negative for chest pain and leg swelling.  Gastrointestinal: Positive for abdominal pain and nausea. Negative for blood in stool, constipation, diarrhea and vomiting.  Genitourinary: Positive for dyspareunia, flank pain and pelvic pain. Negative for decreased urine volume, dysuria, frequency, hematuria, urgency, vaginal bleeding and vaginal discharge.  Musculoskeletal: Negative for back pain.  Skin: Negative for rash.  Neurological: Negative for dizziness, seizures, light-headedness, numbness and headaches.  All other systems reviewed and are negative.  Physical Exam Updated Vital Signs BP 111/74   Pulse 93   Temp 98.8 F (37.1 C) (Oral)   Resp 16   LMP 03/05/2018 (Approximate)   SpO2 99%   Physical Exam  Constitutional: She  appears well-developed and well-nourished.  Non-toxic appearance. She does not appear ill. She appears distressed.  HENT:  Head: Normocephalic and atraumatic.  Mouth/Throat: Oropharynx is clear and moist.  Eyes: Conjunctivae are normal. No scleral icterus.  Neck: Neck supple.  Cardiovascular: Normal rate, regular rhythm and normal heart sounds.  No murmur heard. Pulmonary/Chest: Effort normal and breath sounds normal. No stridor. No respiratory distress. She has no wheezes. She has no rales.  Abdominal: Soft. Bowel sounds are normal. There is tenderness at McBurney's point. There is no rigidity, no rebound and negative Murphy's sign.  TTP to RLQ and right suprapubic area with guarding. Mild right CVA TTP.   Genitourinary:  Genitourinary Comments: Chaperone present during exam Pelvic exam: normal external genitalia without evidence of trauma. VULVA: normal appearing vulva with no masses, tenderness or lesion. VAGINA: normal appearing vagina with normal color and scant amount of white discharge CERVIX: normal appearing cervix without lesions, cervical motion tenderness absent, cervical os closed with out purulent discharge; Wet prep and DNA probe for chlamydia and GC obtained.   ADNEXA: right adnexa with TTP, normal size, s/p left oophorectomy UTERUS: uterus is normal size, shape, consistency, mild TTP  Musculoskeletal: She exhibits no edema.  No calf TTP, erythema, swelling. No TTP throughout the RLE. No palpable cord.   Neurological: She is alert.  Skin: Skin is warm and dry. Capillary refill takes less than 2 seconds.  Psychiatric: She has a normal mood and affect.  Nursing note and vitals reviewed.  ED Treatments / Results  Labs (all labs ordered are listed, but only abnormal results are displayed) Labs Reviewed  WET PREP, GENITAL - Abnormal; Notable for the following components:      Result Value   Yeast Wet Prep HPF POC PRESENT (*)    WBC, Wet Prep HPF POC MANY (*)    All other  components within normal limits  COMPREHENSIVE METABOLIC PANEL - Abnormal; Notable for the following components:   Sodium 133 (*)    Glucose, Bld 319 (*)    BUN 5 (*)    All other components within normal limits  CBC - Abnormal; Notable for the following components:   WBC 16.7 (*)  MCH 25.5 (*)    All other components within normal limits  URINALYSIS, ROUTINE W REFLEX MICROSCOPIC - Abnormal; Notable for the following components:   APPearance HAZY (*)    Glucose, UA >=500 (*)    Ketones, ur 5 (*)    Leukocytes, UA LARGE (*)    Bacteria, UA RARE (*)    All other components within normal limits  CBG MONITORING, ED - Abnormal; Notable for the following components:   Glucose-Capillary 205 (*)    All other components within normal limits  URINE CULTURE  LIPASE, BLOOD  I-STAT BETA HCG BLOOD, ED ([facility], [facility], [facility] ONLY)  GC/CHLAMYDIA PROBE AMP ([place]) NOT AT [facility]    EKG None  Radiology US Transvaginal Non-ob  Result Date: 03/31/2018 CLINICAL DATA:  Right pelvic pain for 2-3 days. Prior left oophorectomy. EXAM: TRANSABDOMINAL AND TRANSVAGINAL ULTRASOUND OF PELVIS DOPPLER ULTRASOUND OF OVARIES TECHNIQUE: Both transabdominal and transvaginal ultrasound examinations of the pelvis were performed. Transabdominal technique was performed for global imaging of the pelvis including uterus, ovaries, adnexal regions, and pelvic cul-de-sac. It was necessary to proceed with endovaginal exam following the transabdominal exam to visualize the endometrium and adnexa. Color and duplex Doppler ultrasound was utilized to evaluate blood flow to the ovaries. COMPARISON:  Today's CT, dictated separately. FINDINGS: Uterus Measurements: 9.7 x 5.3 x 6.3 cm. Small uterine fibroids. Example within the anterior uterine fundus at 9 mm. Within the left uterine body at 2.9 cm. Endometrium Thickness: Normal, 10 mm.  No focal abnormality visualized. Right ovary Measurements: 3.5 x 4.4 x 3.8 cm.  Normal in morphology. Left  ovary Surgically absent Pulsed Doppler evaluation of the right ovary demonstrates normal low-resistance arterial and venous waveforms. Possible hydrosalpinx, as evidenced by a cylindrical structure in the right adnexa on image 72. Other findings No abnormal free fluid. IMPRESSION: 1. Degraded exam secondary to patient body habitus. 2. Small volume uterine fibroids. 3. Normal appearance of the right ovary, given above limitations. Possible right-sided hydrosalpinx. Correlate with risk factors. Electronically Signed   By: Abigail Miyamoto M.D.   On: 03/31/2018 14:49   US Pelvis Complete  Result Date: 03/31/2018 CLINICAL DATA:  Right pelvic pain for 2-3 days. Prior left oophorectomy. EXAM: TRANSABDOMINAL AND TRANSVAGINAL ULTRASOUND OF PELVIS DOPPLER ULTRASOUND OF OVARIES TECHNIQUE: Both transabdominal and transvaginal ultrasound examinations of the pelvis were performed. Transabdominal technique was performed for global imaging of the pelvis including uterus, ovaries, adnexal regions, and pelvic cul-de-sac. It was necessary to proceed with endovaginal exam following the transabdominal exam to visualize the endometrium and adnexa. Color and duplex Doppler ultrasound was utilized to evaluate blood flow to the ovaries. COMPARISON:  Today's CT, dictated separately. FINDINGS: Uterus Measurements: 9.7 x 5.3 x 6.3 cm. Small uterine fibroids. Example within the anterior uterine fundus at 9 mm. Within the left uterine body at 2.9 cm. Endometrium Thickness: Normal, 10 mm.  No focal abnormality visualized. Right ovary Measurements: 3.5 x 4.4 x 3.8 cm.  Normal in morphology. Left ovary Surgically absent Pulsed Doppler evaluation of the right ovary demonstrates normal low-resistance arterial and venous waveforms. Possible hydrosalpinx, as evidenced by a cylindrical structure in the right adnexa on image 72. Other findings No abnormal free fluid. IMPRESSION: 1. Degraded exam secondary to patient body habitus. 2. Small volume uterine  fibroids. 3. Normal appearance of the right ovary, given above limitations. Possible right-sided hydrosalpinx. Correlate with risk factors. Electronically Signed   By: Abigail Miyamoto M.D.   On: 03/31/2018 14:49   Ct Abdomen  Pelvis W Contrast  Result Date: 03/31/2018 CLINICAL DATA:  Abdominal pain in the right lower quadrant radiating to the right flank and thigh for 2 days. EXAM: CT ABDOMEN AND PELVIS WITH CONTRAST TECHNIQUE: Multidetector CT imaging of the abdomen and pelvis was performed using the standard protocol following bolus administration of intravenous contrast. CONTRAST:  169m OMNIPAQUE IOHEXOL 300 MG/ML  SOLN COMPARISON:  Multiple exams, including 03/29/2012 FINDINGS: Body habitus reduces diagnostic sensitivity and specificity. Lower chest: Unremarkable Hepatobiliary: Diffuse hepatic steatosis. Gallbladder unremarkable. Pancreas: Unremarkable Spleen: Unremarkable Adrenals/Urinary Tract: Adrenal glands normal. 4 mm and adjacent 2 mm left kidney lower pole nonobstructive renal calculus. There is a separate 2 mm left kidney lower pole nonobstructive renal calculus and a 3 mm left mid kidney calculus. Separate 2 mm left mid kidney calculus. There is a punctate 1-2 mm calcification in the vicinity of the right distal ureter on image 85/3 which could conceivably be a nonobstructive ureteral calculus, but is more likely to be a small phlebolith. No associated hydroureter or hydronephrosis. Stomach/Bowel: A structure believed to represent the appendix tracks along the right paracolic gutter from the cecum towards the inferior margin of the right hepatic lobe. No appreciable findings of inflammation. Vascular/Lymphatic: A right common iliac node at the level of the bifurcation measures 1.2 cm in short axis, previously 1.1 cm on 03/29/2012. An aortocaval node measures 1.3 cm in short axis on image 49/3, previously the same. Reproductive: Enlarged rupture believed to represent the right ovary 4.9 by 3.6 cm on  image 75/3, formerly 5.4 by 4.7 cm. Other: No supplemental non-categorized findings. Musculoskeletal: Lower lumbar degenerative facet arthropathy. IMPRESSION: 1. The appendix appears normal. 2. Potential enlarged right ovary, although the adnexal prominence seen today was even more prominent in 2013. 3. Mild retroperitoneal adenopathy, chronic, uncertain significance. Not appreciably changed from 2013. 4. Diffuse hepatic steatosis. 5. Punctate calcifications in the pelvis are probably phleboliths, and there is no associated hydronephrosis or hydroureter. There is a small chance that one of the 1-2 mm calcifications in the vicinity of the right distal ureter could be a nonobstructive ureteral calculus, although I doubt it. 6. There are nonobstructive left renal calculi. Electronically Signed   By: WVan ClinesM.D.   On: 03/31/2018 15:13   [place]/ven Flow Abd Pelv Doppler  Result Date: 03/31/2018 CLINICAL DATA:  Right pelvic pain for 2-3 days. Prior left oophorectomy. EXAM: TRANSABDOMINAL AND TRANSVAGINAL ULTRASOUND OF PELVIS DOPPLER ULTRASOUND OF OVARIES TECHNIQUE: Both transabdominal and transvaginal ultrasound examinations of the pelvis were performed. Transabdominal technique was performed for global imaging of the pelvis including uterus, ovaries, adnexal regions, and pelvic cul-de-sac. It was necessary to proceed with endovaginal exam following the transabdominal exam to visualize the endometrium and adnexa. Color and duplex Doppler ultrasound was utilized to evaluate blood flow to the ovaries. COMPARISON:  Today's CT, dictated separately. FINDINGS: Uterus Measurements: 9.7 x 5.3 x 6.3 cm. Small uterine fibroids. Example within the anterior uterine fundus at 9 mm. Within the left uterine body at 2.9 cm. Endometrium Thickness: Normal, 10 mm.  No focal abnormality visualized. Right ovary Measurements: 3.5 x 4.4 x 3.8 cm.  Normal in morphology. Left ovary Surgically absent Pulsed Doppler evaluation of  the right ovary demonstrates normal low-resistance arterial and venous waveforms. Possible hydrosalpinx, as evidenced by a cylindrical structure in the right adnexa on image 72. Other findings No abnormal free fluid. IMPRESSION: 1. Degraded exam secondary to patient body habitus. 2. Small volume uterine fibroids. 3. Normal appearance of the  right ovary, given above limitations. Possible right-sided hydrosalpinx. Correlate with risk factors. Electronically Signed   By: Abigail Miyamoto M.D.   On: 03/31/2018 14:49    Procedures Procedures (including critical care time)  Medications Ordered in ED Medications  sodium chloride 0.9 % bolus 1,000 mL (0 mLs Intravenous Stopped 03/31/18 1650)  ondansetron (ZOFRAN) injection 4 mg (4 mg Intravenous Given 03/31/18 1334)  morphine 4 MG/ML injection 4 mg (4 mg Intravenous Given 03/31/18 1334)  iohexol (OMNIPAQUE) 300 MG/ML solution 100 mL (100 mLs Intravenous Contrast Given 03/31/18 1435)  cefTRIAXone (ROCEPHIN) injection 250 mg (250 mg Intramuscular Given 03/31/18 1646)  azithromycin (ZITHROMAX) tablet 1,000 mg (1,000 mg Oral Given 03/31/18 1647)  fluconazole (DIFLUCAN) tablet 150 mg (150 mg Oral Given 03/31/18 1647)  sterile water (preservative free) injection (1 mL  Given 03/31/18 1650)     Initial Impression / Assessment and Plan / ED Course  I have reviewed the triage vital signs and the nursing notes.  Pertinent labs & imaging results that were available during my care of the patient were reviewed by me and considered in my medical decision making (see chart for details).  3:38 PM CONSULT with on call OB-GYN, who states that patient can f/u in the office as outpt regarding hydrosalpinx. Recommended managing pain and testing for gc/chlamydia.   Final Clinical Impressions(s) / ED Diagnoses   Final diagnoses:  Hydrosalpinx  Urinary tract infection without hematuria, site unspecified  Yeast infection   42 year old female complaining of right lower quadrant  and right pelvic pain ongoing for the last 5 to 6 days.  Afebrile here with normal vital signs. Right lower quadrant, right suprapubic, and right CVA tenderness on exam. Pelvic exam with uterine and right adnexal TTP, no CMT or purulent discharge noted.  Patient has no unilateral leg swelling, erythema, or TTP to suggest deep vein thrombosis.  Doubt dissection or other vascular process at this time.  Labs with leukocytosis to 16. CMP with elevated glucose in the 300s, however elevated no anion gap to suggest DKA.  Her kidney function and liver function are normal.  Her lipase is normal. Will send for culture.  Negative pregnancy test.   Her UA has leukocytes, glucosuria and ketonuria.  Given her history of ovarian cyst which required removal will obtain pelvic ultrasound to rule out torsion, cyst, or mass to the right ovary.  Will also order CT abdomen pelvis to rule out appendicitis, nephrolithiasis, or other acute abdominal process.  CT with normal appendix. Enlarged right ovary and possible 1-42m calcification in the vicinity of the right distal ureter suggesting possible stone. UKore[place]ith suggestion of hydrosalpinx. No torsion. Ob-gyn consulted as above.  Tx'ed prophylactically for gc/chlamydia and for yeast infection in the ED. Also gave rx for abx to tx UTI given leukocytes in UA and questionable ureteral stone on CT. Culture sent. Advised pt to f/u with ob-gyn in regards to hydrosalpinx. Pt also has pcp appt tomorrow AM. Discussed strict return precautions for new or worsening symptoms. All questions answered and pt comfortable with plan for discharge.  ED Discharge Orders        Ordered    cephALEXin (KEFLEX) 500 MG capsule  4 times daily     03/31/18 [address] Makaylah Oddo S, PA-C 04/01/18 [id number]   GSherwood Gambler MD 04/01/18 [id number]

## 2018-03-31 NOTE — ED Triage Notes (Signed)
Pt presents for RLQ pain with radiation to R flank and thigh x 2-3 days. Denies N/V/D.

## 2018-03-31 NOTE — ED Notes (Signed)
Pt reports that her blood sugars have been elevated the past couple days and that is not normal for her. Reports a CBG of 316 this AM

## 2018-04-01 ENCOUNTER — Encounter: Payer: Self-pay | Admitting: Family Medicine

## 2018-04-01 ENCOUNTER — Ambulatory Visit (INDEPENDENT_AMBULATORY_CARE_PROVIDER_SITE_OTHER): Payer: 59 | Admitting: Family Medicine

## 2018-04-01 VITALS — BP 110/80 | HR 112 | Resp 16 | Ht 66.0 in | Wt 315.4 lb

## 2018-04-01 DIAGNOSIS — N200 Calculus of kidney: Secondary | ICD-10-CM

## 2018-04-01 DIAGNOSIS — G40409 Other generalized epilepsy and epileptic syndromes, not intractable, without status epilepticus: Secondary | ICD-10-CM | POA: Diagnosis not present

## 2018-04-01 DIAGNOSIS — E785 Hyperlipidemia, unspecified: Secondary | ICD-10-CM | POA: Diagnosis not present

## 2018-04-01 DIAGNOSIS — E1129 Type 2 diabetes mellitus with other diabetic kidney complication: Secondary | ICD-10-CM

## 2018-04-01 DIAGNOSIS — E1169 Type 2 diabetes mellitus with other specified complication: Secondary | ICD-10-CM | POA: Diagnosis not present

## 2018-04-01 DIAGNOSIS — K7581 Nonalcoholic steatohepatitis (NASH): Secondary | ICD-10-CM

## 2018-04-01 DIAGNOSIS — Z794 Long term (current) use of insulin: Secondary | ICD-10-CM

## 2018-04-01 DIAGNOSIS — R1031 Right lower quadrant pain: Secondary | ICD-10-CM

## 2018-04-01 DIAGNOSIS — R809 Proteinuria, unspecified: Secondary | ICD-10-CM | POA: Diagnosis not present

## 2018-04-01 LAB — GC/CHLAMYDIA PROBE AMP (~~LOC~~) NOT AT ARMC
CHLAMYDIA, DNA PROBE: NEGATIVE
NEISSERIA GONORRHEA: NEGATIVE

## 2018-04-01 LAB — URINE CULTURE

## 2018-04-01 LAB — POCT GLYCOSYLATED HEMOGLOBIN (HGB A1C): Hemoglobin A1C: 10.4 % — AB (ref 4.0–5.6)

## 2018-04-01 NOTE — Progress Notes (Signed)
Name: Erin Good   MRN: [id number]    DOB: Mar 14, 1976   Date:04/01/2018       Progress Note  Subjective  Chief Complaint  Chief Complaint  Patient presents with  . Gastroesophageal Reflux  . Diabetes  . Anxiety  . Depression  . Pyelonephritis    She was evaluated at ED yesterday. She may have a kidney stone. She is being treated for UTI. She has constant pressure x 1 week. 5/10 pain score.    HPI   DMII: she was seen in Feb 2018 with Bell's palsy and was not using any of her medications at the time. hgbA1C 9.9 10.3 , 10.9%, 10.4% 9.4%,  9.8% and up again at 10.4%, explained that I will need to refer her to Endo. She was on [place] but insurance stopped covering, it worked best for her to increase compliance, she is currently on [place] , needs to titrate dose up. She states fasting has been around 120 and is terrified of going up on the dose ( mother died of insulin overdose), however over the past couple of weeks she has been feeling sick and glucose is spiking to 300's. She had to stop Synjardi because of severe yeast infection, she was given metformin but also worries about taking medications. She did not bring her sugar log again. She denies polyphagia, has polydipsia but has noticed  Polyuria. She has dyslipidemia and fatty liver and needs to get glucose under control   OSA:  she has been wearing CPAP 4-5 times a week. She states sometimes she forgets to put it on.  Right shoulder impingement syndrome: started August 2017. She had a seizure that caused her to fall and injury her right shoulder. She was seen by Cassie Freer , had MRI and advised to have PT, however unable to keep appointments because of her work schedule and cost. She had a steroid injection without much improvement and has been seeing a chiropractor ( [facility] ), only having pain at night now, unchanged and stable  Seizure disorder: she sees Dr. Lysle Rubens, had one seizure Summer  2017, she is doing well since. Taking medication as prescribed. She is on higher dose of medication since episode, she finished rx vitamin D and is currently taking otc supplementation. Unchanged   Obesity: weight is stable, she states she eats smaller portions, she is trying to avoid carbohydrates. Drinking more water, Diet sodas only, she has been cooking at home. She is not very physically active and will try to incorporate some walking daily, 10 minutes daily    Right lower quadrant pain started one week ago, she started with pain/pressure on RLQ, and radiates to lower back and over the weekend noticed pain radiating to right thigh also. She states symptoms got worse and went to [facility], had pelvic US and CT scan, she has possible hydrosalpinx and also kidney stones. She was given antibiotics that were not filled yet. We will refer her to Urologist and consider gyn referral if needed.   Fatty liver: discussed importance of life style modification    Patient Active Problem List   Diagnosis Date Noted  . Vitamin D deficiency 01/31/2017  . Right shoulder tendinitis 01/31/2017  . Tendinosis 01/31/2017  . Bell's palsy 12/19/2016  . Tachycardia 09/30/2016  . Depression with anxiety 05/06/2015  . Allergic rhinitis 04/20/2015  . Anxiety and depression 04/20/2015  . 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 Gastro-esophageal reflux disease without esophagitis 04/20/2015  . Dysmetabolic  syndrome 04/20/2015  . Extreme obesity 04/20/2015  . NASH (nonalcoholic steatohepatitis) 04/20/2015  . Allergy to nuts 04/20/2015  . Calculus of kidney 04/20/2015  . Type 2 diabetes mellitus with renal manifestations ([place]) 04/20/2015  . Central sleep apnea 11/26/2008  . Dyslipidemia 07/02/2008  . Leukocytosis 07/29/2007    Past Surgical History:  Procedure Laterality Date  . CESAREAN SECTION     X 2  . RIGHT OOPHORECTOMY Right 2001   benign tumor  . TUBAL LIGATION  2007    Family History   Problem Relation Age of Onset  . Diabetes Mother   . Breast cancer Paternal Grandmother 61  . Cancer Paternal Grandmother   . Cancer Paternal Aunt   . Cancer Maternal Grandmother   . Heart disease Neg Hx     Social History   Socioeconomic History  . Marital status: Married    Spouse name: Roderic Palau  . Number of children: 2  . Years of education: College  . Highest education level: Not on file  Occupational History  . Occupation: Optometrist  Social Needs  . Financial resource strain: Not on file  . Food insecurity:    Worry: Not on file    Inability: Not on file  . Transportation needs:    Medical: Not on file    Non-medical: Not on file  Tobacco Use  . Smoking status: Never Smoker  . Smokeless tobacco: Never Used  Substance and Sexual Activity  . Alcohol use: No  . Drug use: No  . Sexual activity: Yes    Partners: Male    Birth control/protection: Surgical  Lifestyle  . Physical activity:    Days per week: Not on file    Minutes per session: Not on file  . Stress: Not on file  Relationships  . Social connections:    Talks on phone: Not on file    Gets together: Not on file    Attends religious service: Not on file    Active member of club or organization: Not on file    Attends meetings of clubs or organizations: Not on file    Relationship status: Not on file  . Intimate partner violence:    Fear of current or ex partner: Not on file    Emotionally abused: Not on file    Physically abused: Not on file    Forced sexual activity: Not on file  Other Topics Concern  . Not on file  Social History Narrative  . Not on file     Current Outpatient Medications:  .  cephALEXin (KEFLEX) 500 MG capsule, Take 1 capsule (500 mg total) by mouth 4 (four) times daily for 7 days., Disp: 28 capsule, Rfl: 0 .  cholecalciferol (VITAMIN D) 1000 units tablet, Take 1 tablet (1,000 Units total) by mouth daily., Disp: 30 tablet, Rfl: 0 .  clonazePAM (KLONOPIN) 0.5 MG tablet,  Take 0.5 mg by mouth only at the onset of a seizure, Disp: , Rfl: 3 .  Continuous Blood Gluc Receiver (FREESTYLE LIBRE 14 DAY READER) DEVI, 1 each by Does not apply route daily., Disp: 1 Device, Rfl: 1 .  Continuous Blood Gluc Sensor (FREESTYLE LIBRE 14 DAY SENSOR) MISC, 1 each by Does not apply route every 14 (fourteen) days., Disp: 2 each, Rfl: 5 .  EPINEPHrine (EPIPEN 2-PAK) 0.3 mg/0.3 mL IJ SOAJ injection, Inject 0.3 mg into the muscle once as needed (for anaphylaxis). , Disp: , Rfl:  .  fluticasone (FLONASE) 50 MCG/ACT nasal spray, Place 2  sprays into both nostrils daily as needed., Disp: , Rfl: 12 .  Insulin Glargine-Lixisenatide (SOLIQUA) 100-33 UNT-MCG/ML SOPN, Inject 22 Units into the skin daily., Disp: , Rfl:  .  Insulin Pen Needle (NOVOFINE) 32G X 6 MM MISC, 1 each daily by Does not apply route., Disp: 100 each, Rfl: 0 .  levocetirizine (XYZAL) 5 MG tablet, Take 1 tablet (5 mg total) by mouth every evening. (Patient taking differently: Take 5 mg by mouth daily as needed for allergies. ), Disp: 30 tablet, Rfl: 2 .  Lorcaserin HCl ER (BELVIQ XR) 20 MG TB24, Take 1 tablet by mouth daily., Disp: 30 tablet, Rfl: 2 .  metFORMIN (GLUCOPHAGE XR) 750 MG 24 hr tablet, Take 1 tablet (750 mg total) by mouth daily with breakfast., Disp: 60 tablet, Rfl: 2 .  montelukast (SINGULAIR) 10 MG tablet, Take 1 tablet (10 mg total) by mouth at bedtime., Disp: 30 tablet, Rfl: 2 .  OVER THE COUNTER MEDICATION, Take 1 packet by mouth daily. The Nature Made Diabetes Health Pack (Multivitamin, Fish Oil, Vitamin D3, Magnesium,Vitamin C, Alpha Lipoic Acid, Chromium), Disp: , Rfl:  .  zonisamide (ZONEGRAN) 100 MG capsule, Take 400 mg by mouth at bedtime. , Disp: , Rfl:   Allergies  Allergen Reactions  . Peanuts [Peanut Oil] Anaphylaxis  . 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  Respiratory: Negative for cough and  shortness of breath.   Cardiovascular: Negative for chest pain or palpitations.  Gastrointestinal: Positive  for abdominal pain but no bowel changes.  Musculoskeletal: positive  for gait problem ( when she first gets up in am) but  joint swelling.  Skin: Negative for rash.  Neurological: Negative for dizziness or headache.  No other specific complaints in a complete review of systems (except as listed in HPI above).  Objective  Vitals:   04/01/18 0936  BP: 110/80  Pulse: (!) 112  Resp: 16  SpO2: 97%  Weight: (!) 315 lb 6.4 oz (143.1 kg)  Height: 5' 6" (1.676 m)    Body mass index is 50.91 kg/m.  Physical Exam   Constitutional: Patient appears well-developed and well-nourished. Obese  No distress.  HEENT: head atraumatic, normocephalic, pupils equal and reactive to light,  neck supple, throat within normal limits Cardiovascular: Normal rate, regular rhythm and normal heart sounds.  No murmur heard. No BLE edema. Pulmonary/Chest: Effort normal and breath sounds normal. No respiratory distress. Abdominal: Soft.  There is right lower quadrant tenderness and left upper quadrant pain, voluntary guarding, negative CVA tenderness  Psychiatric: Patient has a normal mood and affect. behavior is normal. Judgment and thought content normal.    Recent Results (from the past 2160 hour(s))  Urinalysis, Routine w reflex microscopic     Status: Abnormal   Collection Time: 03/31/18 11:57 AM  Result Value Ref Range   Color, Urine YELLOW YELLOW   APPearance HAZY (A) CLEAR   Specific Gravity, Urine 1.027 1.005 - 1.030   pH 6.0 5.0 - 8.0   Glucose, UA >=500 (A) NEGATIVE mg/dL   Hgb urine dipstick NEGATIVE NEGATIVE   Bilirubin Urine NEGATIVE NEGATIVE   Ketones, ur 5 (A) NEGATIVE mg/dL   Protein, ur NEGATIVE NEGATIVE mg/dL   Nitrite NEGATIVE NEGATIVE   Leukocytes, UA LARGE (A) NEGATIVE   RBC / HPF 0-5 0 - 5 RBC/hpf   WBC, UA 11-20 0 - 5 WBC/hpf  Bacteria, UA RARE (A) NONE SEEN    Squamous Epithelial / LPF 6-10 0 - 5   Mucus PRESENT     Comment: Performed at [facility], [place] [address] [place], [place] [address]  Lipase, blood     Status: None   Collection Time: 03/31/18 12:00 PM  Result Value Ref Range   Lipase 28 11 - 51 U/L    Comment: Performed at [facility], [place] [address] [place], [place] [address]  Comprehensive metabolic panel     Status: Abnormal   Collection Time: 03/31/18 12:00 PM  Result Value Ref Range   Sodium 133 (L) 135 - 145 mmol/L   Potassium 4.2 3.5 - 5.1 mmol/L   Chloride 101 101 - 111 mmol/L   CO2 24 22 - 32 mmol/L   Glucose, Bld 319 (H) 65 - 99 mg/dL   BUN 5 (L) 6 - 20 mg/dL   Creatinine, Ser 0.65 0.44 - 1.00 mg/dL   Calcium 9.3 8.9 - 10.3 mg/dL   Total Protein 7.7 6.5 - 8.1 g/dL   Albumin 3.5 3.5 - 5.0 g/dL   AST 17 15 - 41 U/L   ALT 19 14 - 54 U/L   Alkaline Phosphatase 102 38 - 126 U/L   Total Bilirubin 0.5 0.3 - 1.2 mg/dL   GFR calc non Af Amer >60 >60 mL/min   GFR calc Af Amer >60 >60 mL/min    Comment: (NOTE) The eGFR has been calculated using the CKD EPI equation. This calculation has not been validated in all clinical situations. eGFR's persistently <60 mL/min signify possible Chronic Kidney Disease.    Anion gap 8 5 - 15    Comment: Performed at [facility] [address], [place], [place] [address]  CBC     Status: Abnormal   Collection Time: 03/31/18 12:00 PM  Result Value Ref Range   WBC 16.7 (H) 4.0 - 10.5 K/uL   RBC 4.95 3.87 - 5.11 MIL/uL   Hemoglobin 12.6 12.0 - 15.0 g/dL   HCT 39.4 36.0 - 46.0 %   MCV 79.6 78.0 - 100.0 fL   MCH 25.5 (L) 26.0 - 34.0 pg   MCHC 32.0 30.0 - 36.0 g/dL   RDW 13.1 11.5 - 15.5 %   Platelets 331 150 - 400 K/uL    Comment: Performed at [facility] [address], [place], [place] [address]  I-Stat beta hCG blood, ED     Status: None   Collection Time: 03/31/18 12:05 PM  Result Value Ref Range   I-stat hCG, quantitative <5.0 <5 mIU/mL   Comment 3             Comment:   GEST. AGE      CONC.  (mIU/mL)   <=1 WEEK        5 - 50     2 WEEKS       50 - 500     3 WEEKS       100 - 10,000     4 WEEKS     1,000 - 30,000        FEMALE AND NON-PREGNANT FEMALE:     LESS THAN 5 mIU/mL   Wet prep, genital     Status: Abnormal   Collection Time: 03/31/18  1:51 PM  Result Value Ref Range   Yeast Wet Prep HPF POC PRESENT (A) NONE SEEN   Trich, Wet Prep NONE SEEN NONE SEEN  Clue Cells Wet Prep HPF POC NONE SEEN NONE SEEN   WBC, Wet Prep HPF POC MANY (A) NONE SEEN   Sperm NONE SEEN     Comment: Performed at [facility], [address] [place], [place] [address]  POC CBG, ED     Status: Abnormal   Collection Time: 03/31/18  3:43 PM  Result Value Ref Range   Glucose-Capillary 205 (H) 65 - 99 mg/dL  POCT HgB A1C     Status: Abnormal   Collection Time: 04/01/18 10:05 AM  Result Value Ref Range   Hemoglobin A1C 10.4 (A) 4.0 - 5.6 %   HbA1c, POC (prediabetic range)  5.7 - 6.4 %   HbA1c, POC (controlled diabetic range)  0.0 - 7.0 %      PHQ2/9: Depression screen [facility] 2/9 04/01/2018 01/31/2017 12/19/2016 09/21/2016 07/20/2015  Decreased Interest 1 0 0 0 0  Down, Depressed, Hopeless 0 0 0 0 0  PHQ - 2 Score 1 0 0 0 0  Altered sleeping 0 - - - -  Tired, decreased energy 2 - - - -  Change in appetite 1 - - - -  Feeling bad or failure about yourself  0 - - - -  Trouble concentrating 0 - - - -  Moving slowly or fidgety/restless 1 - - - -  Suicidal thoughts 0 - - - -  PHQ-9 Score 5 - - - -  Difficult doing work/chores Not difficult at all - - - -   She is feeling sick, states symptoms present for only a couple of weeks.   Fall Risk: Fall Risk  04/01/2018 12/18/2017 09/17/2017 01/31/2017 12/19/2016  Falls in the past year? No No No Yes Yes  Number falls in past yr: - - - 1 1  Injury with Fall? - - - Yes Yes  Comment - - - - Hurt her right shoulder      Assessment & Plan  1. Type 2 diabetes mellitus with microalbuminuria, with long-term  current use of insulin (HCC)  - POCT HgB A1C - Ambulatory referral to Endocrinology Go up to 60 units of [place] and referral to Endo, unable to get patient to goal   2. Dyslipidemia associated with type 2 diabetes mellitus ([place])  - Ambulatory referral to Endocrinology  3. Morbid obesity ([place])  Discussed with the patient the risk posed by an increased BMI. Discussed importance of portion control, calorie counting and at least 150 minutes of physical activity weekly. Avoid sweet beverages and drink more water. Eat at least 6 servings of fruit and vegetables daily   4. 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 NASH (nonalcoholic steatohepatitis)  Also seen on CT done at [facility], discussed weight loss and life style modification  6. Dyslipidemia  She refuses medications  7. Acute right lower quadrant pain  - Ambulatory referral to Urology  8. Nephrolithiasis  - Ambulatory referral to Urology

## 2018-04-17 ENCOUNTER — Encounter: Payer: Self-pay | Admitting: Endocrinology

## 2018-06-08 DIAGNOSIS — M25571 Pain in right ankle and joints of right foot: Secondary | ICD-10-CM | POA: Diagnosis not present

## 2018-06-14 IMAGING — MR MR SHOULDER*R* W/O CM
4 of 5 series · 27 of 40 positions shown · non-contrast
Comparison: None.

CLINICAL DATA: Right shoulder pain and weakness for 2 months status
post fall.

EXAM:
MRI OF THE RIGHT SHOULDER WITHOUT CONTRAST
TECHNIQUE: Multiplanar, multisequence MR imaging of the shoulder was performed.
No intravenous contrast was administered.

[Series 3: T2 fat-sat · axial · 4.0mm · 0.55mm/px · z∈[-18,+62]mm · 8 of 20 slices shown (1 of 3)]
[im 1/20]
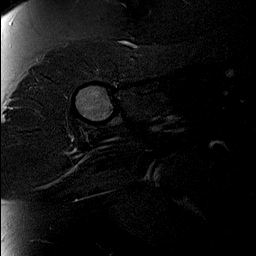
[im 3/20]
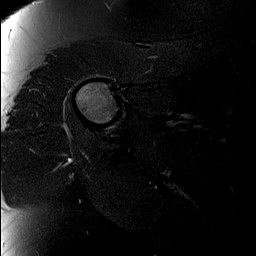
[im 6/20]
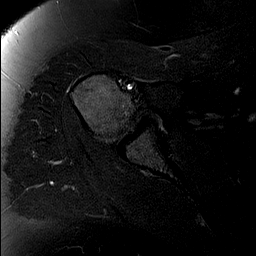
[im 9/20]
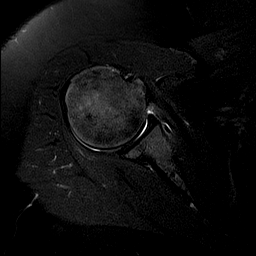
[im 11/20]
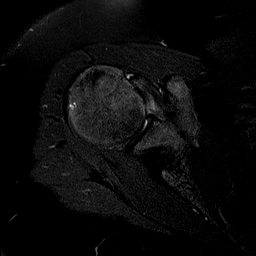
[im 14/20]
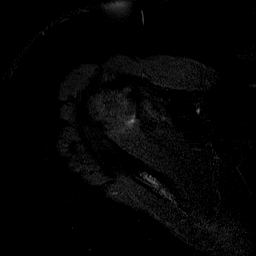
[im 17/20]
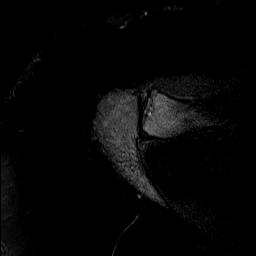
[im 20/20]
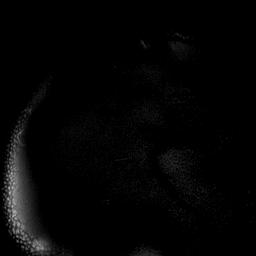

[Series 4: T2 fat-sat · oblique · 4.0mm · 0.55mm/px · 8 of 20 slices shown (2 of 3)]
[im 1/20]
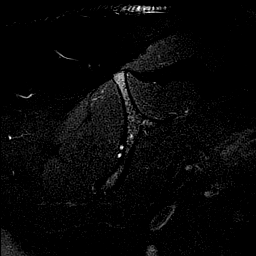
[im 3/20]
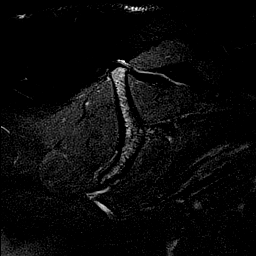
[im 6/20]
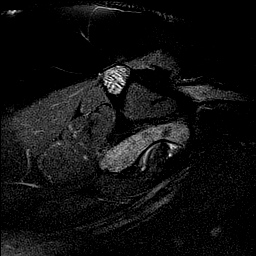
[im 9/20]
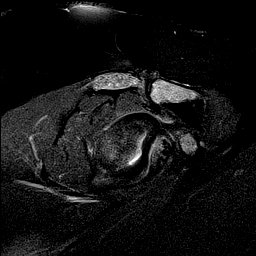
[im 11/20]
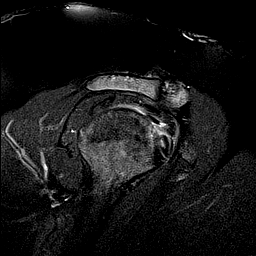
[im 14/20]
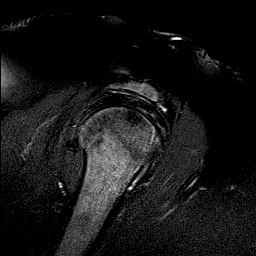
[im 17/20]
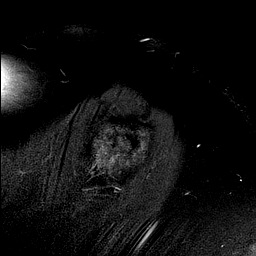
[im 20/20]
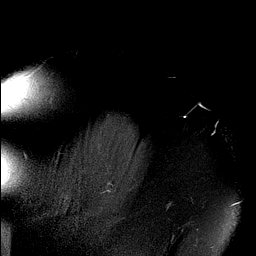

[Series 6: T2 fat-sat · oblique · 4.0mm · 0.27mm/px · 3 of 19 slices shown (3 of 3)]
[im 3/19]
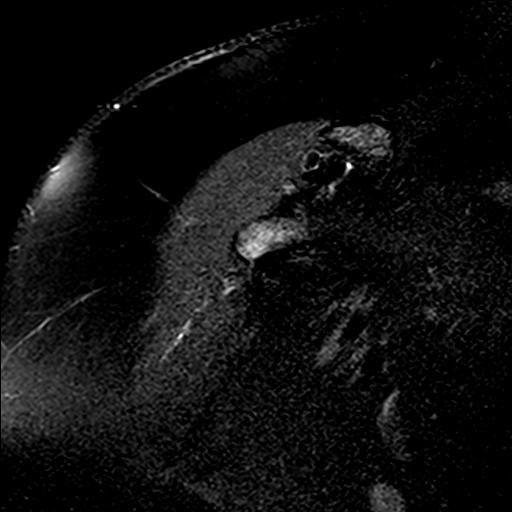
[im 11/19]
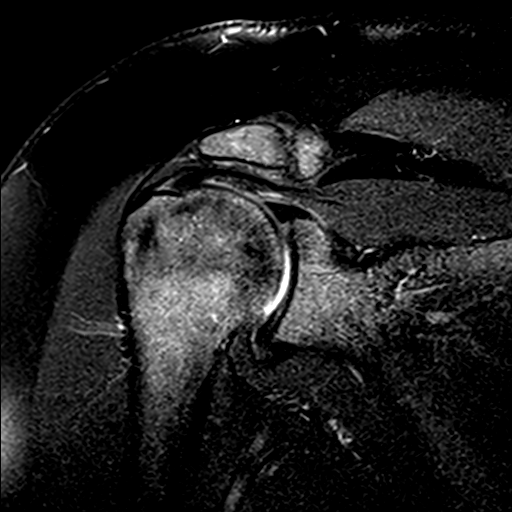
[im 16/19]
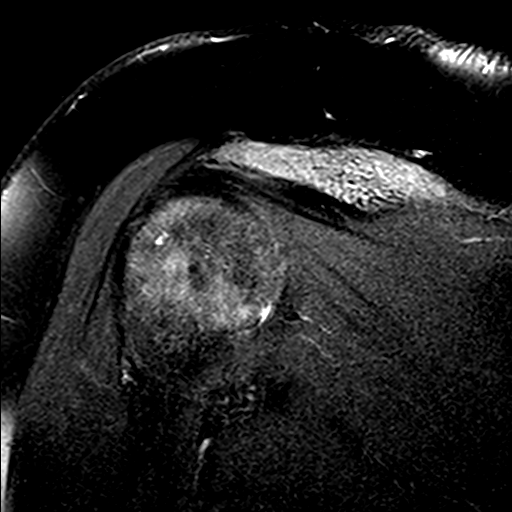

[Series 7: PD · oblique · 4.0mm · 0.22mm/px · 8 of 19 slices shown]
[im 1/19]
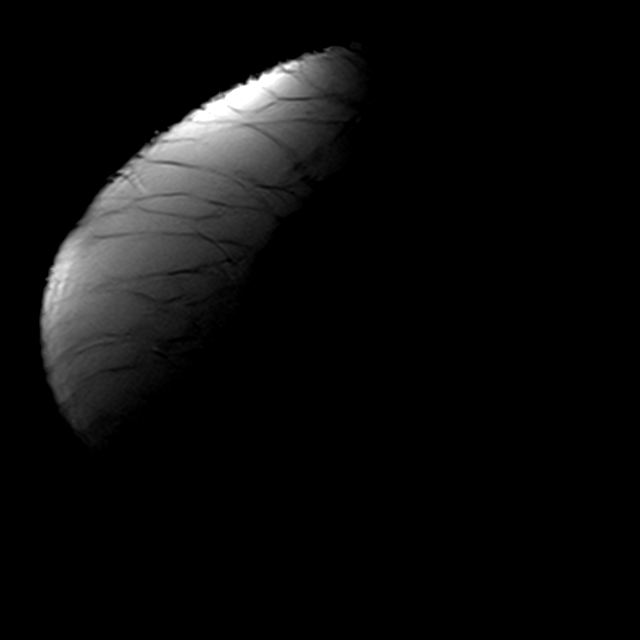
[im 3/19]
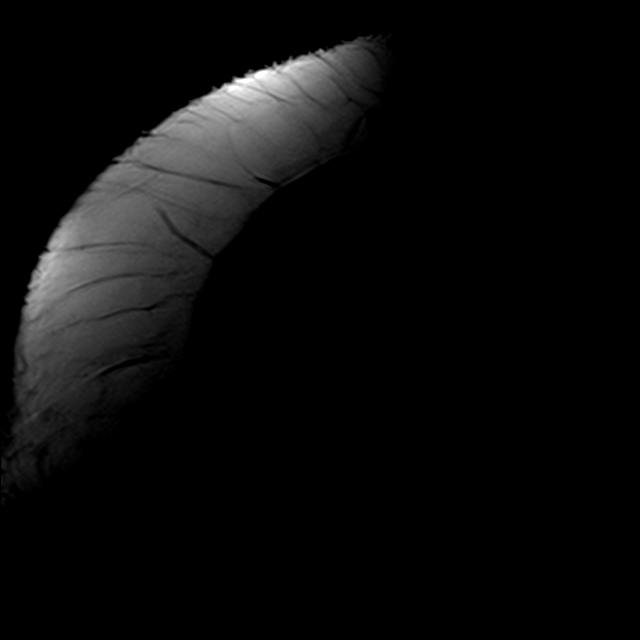
[im 6/19]
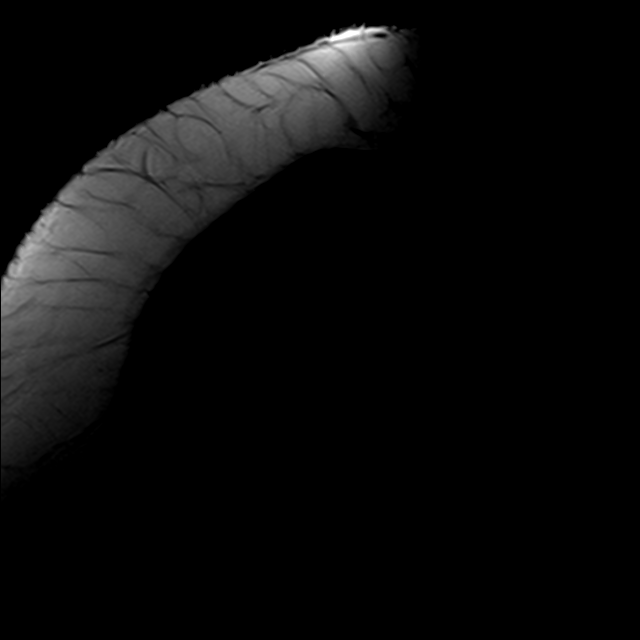
[im 8/19]
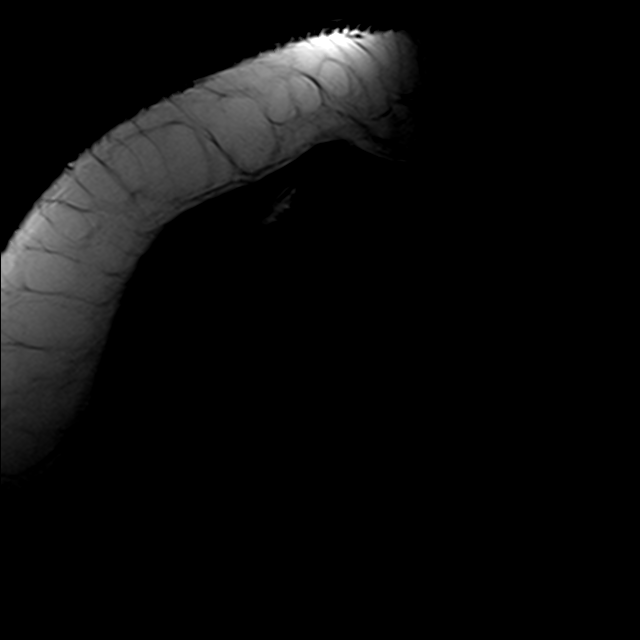
[im 11/19]
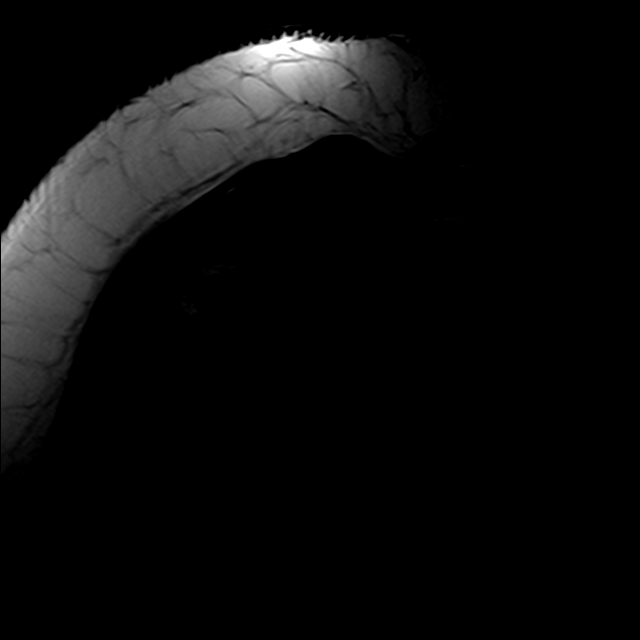
[im 13/19]
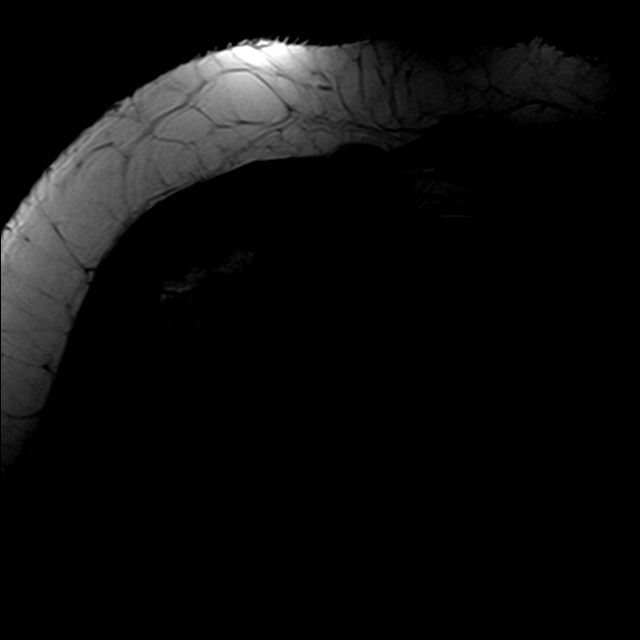
[im 16/19]
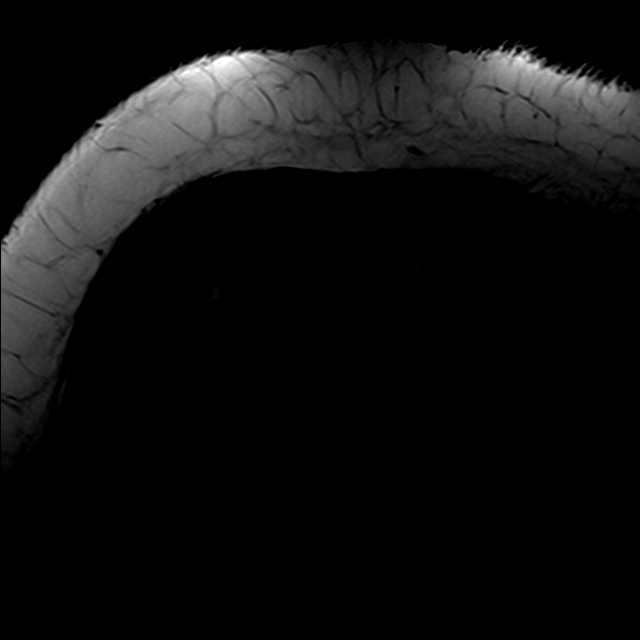
[im 19/19]
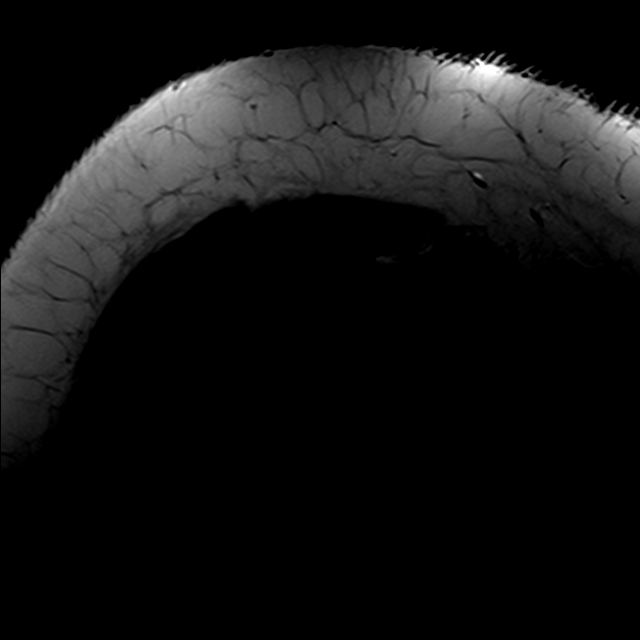

[27 of 40 positions shown; findings below may reference images not displayed]

FINDINGS: Rotator cuff: Mild tendinosis of the supraspinatus tendon with a
small insertional interstitial tear. Infraspinatus tendon is intact.
Teres minor tendon is intact. Mild tendinosis of the subscapularis
tendon.

Muscles: No atrophy or fatty replacement of nor abnormal signal
within, the muscles of the rotator cuff.

Biceps long head:  Intact.

Acromioclavicular Joint: Mild arthropathy of the acromioclavicular
joint. Type II acromion. No subacromial/subdeltoid bursal fluid.

Glenohumeral Joint: No joint effusion. No chondral defect.

Labrum: Limited evaluation secondary lack of intra-articular
contrast. Blunting of the posterior labrum likely reflecting tear.

Bones: No focal marrow signal abnormality. No fracture or
dislocation.

Other: No fluid collection or hematoma.
IMPRESSION: 1. Mild tendinosis of the supraspinatus tendon with a small
insertional interstitial tear.
2. Mild tendinosis of the subscapularis tendon.

## 2018-07-16 ENCOUNTER — Encounter: Payer: 59 | Admitting: Family Medicine

## 2018-08-05 DIAGNOSIS — J069 Acute upper respiratory infection, unspecified: Secondary | ICD-10-CM | POA: Diagnosis not present

## 2018-08-05 DIAGNOSIS — J029 Acute pharyngitis, unspecified: Secondary | ICD-10-CM | POA: Diagnosis not present

## 2018-08-27 DIAGNOSIS — N390 Urinary tract infection, site not specified: Secondary | ICD-10-CM | POA: Diagnosis not present

## 2018-08-27 DIAGNOSIS — R309 Painful micturition, unspecified: Secondary | ICD-10-CM | POA: Diagnosis not present

## 2018-08-27 DIAGNOSIS — E119 Type 2 diabetes mellitus without complications: Secondary | ICD-10-CM | POA: Diagnosis not present

## 2018-09-10 ENCOUNTER — Emergency Department (HOSPITAL_COMMUNITY)
Admission: EM | Admit: 2018-09-10 | Discharge: 2018-09-10 | Disposition: A | Discharge status: Home / Self Care | Payer: 59 | Attending: Emergency Medicine | Admitting: Emergency Medicine

## 2018-09-10 ENCOUNTER — Emergency Department (HOSPITAL_COMMUNITY): Payer: 59

## 2018-09-10 ENCOUNTER — Encounter (HOSPITAL_COMMUNITY): Payer: Self-pay | Admitting: Emergency Medicine

## 2018-09-10 DIAGNOSIS — E119 Type 2 diabetes mellitus without complications: Secondary | ICD-10-CM | POA: Diagnosis not present

## 2018-09-10 DIAGNOSIS — Z794 Long term (current) use of insulin: Secondary | ICD-10-CM | POA: Diagnosis not present

## 2018-09-10 DIAGNOSIS — N3000 Acute cystitis without hematuria: Secondary | ICD-10-CM | POA: Insufficient documentation

## 2018-09-10 DIAGNOSIS — Z91010 Allergy to peanuts: Secondary | ICD-10-CM | POA: Insufficient documentation

## 2018-09-10 DIAGNOSIS — Z79899 Other long term (current) drug therapy: Secondary | ICD-10-CM | POA: Insufficient documentation

## 2018-09-10 DIAGNOSIS — N200 Calculus of kidney: Secondary | ICD-10-CM | POA: Diagnosis not present

## 2018-09-10 DIAGNOSIS — R109 Unspecified abdominal pain: Secondary | ICD-10-CM | POA: Diagnosis present

## 2018-09-10 LAB — URINALYSIS, ROUTINE W REFLEX MICROSCOPIC
BILIRUBIN URINE: NEGATIVE
Glucose, UA: >=500 mg/dL — AB
Ketones, ur: 5 mg/dL — AB
Nitrite: NEGATIVE
PROTEIN: NEGATIVE mg/dL
SPECIFIC GRAVITY, URINE: 1.024 (ref 1.005–1.030)
pH: 7 (ref 5.0–8.0)

## 2018-09-10 LAB — COMPREHENSIVE METABOLIC PANEL
ALT: 23 U/L (ref 0–44)
AST: 18 U/L (ref 15–41)
Albumin: 3.5 g/dL (ref 3.5–5.0)
Alkaline Phosphatase: 94 U/L (ref 38–126)
Anion gap: 7 (ref 5–15)
BILIRUBIN TOTAL: 0.4 mg/dL (ref 0.3–1.2)
BUN: 5 mg/dL — AB (ref 6–20)
CHLORIDE: 101 mmol/L (ref 98–111)
CO2: 27 mmol/L (ref 22–32)
CREATININE: 0.57 mg/dL (ref 0.44–1.00)
Calcium: 9.1 mg/dL (ref 8.9–10.3)
GFR calc Af Amer: >60 mL/min (ref >60)
Glucose, Bld: 297 mg/dL — ABNORMAL HIGH (ref 70–99)
Potassium: 3.5 mmol/L (ref 3.5–5.1)
Sodium: 135 mmol/L (ref 135–145)
Total Protein: 7.4 g/dL (ref 6.5–8.1)

## 2018-09-10 LAB — CBC WITH DIFFERENTIAL/PLATELET
Abs Immature Granulocytes: 0.06 10*3/uL (ref 0.00–0.07)
Basophils Absolute: 0 10*3/uL (ref 0.0–0.1)
Basophils Relative: 0 %
Eosinophils Absolute: 0 10*3/uL (ref 0.0–0.5)
Eosinophils Relative: 0 %
HCT: 41.1 % (ref 36.0–46.0)
HEMOGLOBIN: 12.9 g/dL (ref 12.0–15.0)
IMMATURE GRANULOCYTES: 1 %
LYMPHS ABS: 3.5 10*3/uL (ref 0.7–4.0)
Lymphocytes Relative: 27 %
MCH: 25.3 pg — ABNORMAL LOW (ref 26.0–34.0)
MCHC: 31.4 g/dL (ref 30.0–36.0)
MCV: 80.7 fL (ref 80.0–100.0)
MONOS PCT: 6 %
Monocytes Absolute: 0.8 10*3/uL (ref 0.1–1.0)
NEUTROS ABS: 8.5 10*3/uL — AB (ref 1.7–7.7)
NEUTROS PCT: 66 %
Platelets: 284 10*3/uL (ref 150–400)
RBC: 5.09 MIL/uL (ref 3.87–5.11)
RDW: 13.2 % (ref 11.5–15.5)
WBC: 12.8 10*3/uL — ABNORMAL HIGH (ref 4.0–10.5)
nRBC: 0 % (ref 0.0–0.2)

## 2018-09-10 LAB — LIPASE, BLOOD: LIPASE: 27 U/L (ref 11–51)

## 2018-09-10 LAB — I-STAT CG4 LACTIC ACID, ED: LACTIC ACID, VENOUS: 1.06 mmol/L (ref 0.5–1.9)

## 2018-09-10 LAB — POC URINE PREG, ED: PREG TEST UR: NEGATIVE

## 2018-09-10 MED ORDER — IOHEXOL 300 MG/ML  SOLN
100.0000 mL | Freq: Once | INTRAMUSCULAR | Status: AC | PRN
  Administered 2018-09-10: 100 mL via INTRAVENOUS

## 2018-09-10 MED ORDER — MORPHINE SULFATE (PF) 4 MG/ML IV SOLN
4.0000 mg | Freq: Once | INTRAVENOUS | Status: DC
  Filled 2018-09-10: qty 1

## 2018-09-10 MED ORDER — SODIUM CHLORIDE 0.9 % IV SOLN
1.0000 g | Freq: Once | INTRAVENOUS | Status: AC
  Administered 2018-09-10: 1 g via INTRAVENOUS
  Filled 2018-09-10: qty 10

## 2018-09-10 MED ORDER — ONDANSETRON HCL 4 MG/2ML IJ SOLN
4.0000 mg | Freq: Once | INTRAMUSCULAR | Status: AC
  Administered 2018-09-10: 4 mg via INTRAVENOUS
  Filled 2018-09-10: qty 2

## 2018-09-10 MED ORDER — ONDANSETRON 4 MG PO TBDP: 4.0000 mg | ORAL_TABLET | Freq: Three times a day (TID) | ORAL | 0 refills | Status: DC | PRN

## 2018-09-10 MED ORDER — CEPHALEXIN 500 MG PO CAPS: 500.0000 mg | ORAL_CAPSULE | Freq: Three times a day (TID) | ORAL | 0 refills | Status: DC

## 2018-09-10 MED ORDER — FENTANYL CITRATE (PF) 100 MCG/2ML IJ SOLN
50.0000 ug | Freq: Once | INTRAMUSCULAR | Status: DC
  Filled 2018-09-10: qty 2

## 2018-09-10 MED ORDER — SODIUM CHLORIDE 0.9 % IV SOLN
1000.0000 mL | Freq: Once | INTRAVENOUS | Status: AC
  Administered 2018-09-10: 1000 mL via INTRAVENOUS

## 2018-09-10 NOTE — ED Notes (Signed)
Patient states she is unable to urinate at this time. 

## 2018-09-10 NOTE — ED Triage Notes (Signed)
Pt presents to ED for assessment after being diagnosed with a UTI 2 weeks ago, and now returns still feeling bad, having lower back pain, intermittent chills at home.  Pt finished Macrobid over 1 week ago.

## 2018-09-10 NOTE — ED Notes (Signed)
Pt stable, ambulatory, states understanding of discharge instructions 

## 2018-09-10 NOTE — ED Provider Notes (Signed)
Pt signed out to me by previous provider. She is a 42 year old female who was treated for UTI last week at [facility] with macrobid. A culture was done and she was told that the bacteria was sensitive to macrobid. She's been having fever, chills, nausea, flank pain. CT abdomen/pelvis was ordered to assess for pyelo. She has a leukocytosis. Lactic acid is normal  CT is normal. UA still appears consistent with UTI although is somewhat contaminated. Will treat for clinical pyelo. She was given a dose of Rocephin and Keflex rx. Urine culture was sent.         Recardo Evangelist, PA-C 09/10/18 2110    Carmin Muskrat, MD 09/11/18 [phone]

## 2018-09-10 NOTE — ED Notes (Signed)
Patient transported to CT 

## 2018-09-10 NOTE — ED Notes (Signed)
Gave pt coke and graham crackers, told her to inform us of how she is tolerating it

## 2018-09-10 NOTE — ED Notes (Signed)
Pt ambulated independently to restroom, gait steady and even

## 2018-09-10 NOTE — ED Provider Notes (Signed)
[facility] EMERGENCY DEPARTMENT Provider Note   CSN: [id number] Arrival date & time: 09/10/18  1313     History   Chief Complaint No chief complaint on file.   HPI Erin Good is a 42 y.o. female.  The history is provided by the patient. No language interpreter was used.     43 year old female with history of diabetes, obesity, seizures, kidney stones presenting for evaluation of abdominal pain.  Patient mention for the past 2 weeks she has had abdominal discomfort.  States she was initially evaluated for her condition, was diagnosed with having urinary tract infection, and was placed on Macrobid.  Symptoms did improve but now she is endorsing subjective fever, chills, generalized body aches, fatigue, back pain and lower abdominal pain.  Symptom is moderate in severity, rates pain as 9 out of 10.  States burning urination has since resolved.  No complaints of chest pain, shortness of breath, productive cough, vaginal bleeding or vaginal discharge.  Past Medical History:  Diagnosis Date  . Allergic rhinitis   . Breast discharge 06/05/2017   2 weeks ago left  . Breast mass 12/06/2016   left  . Diabetes mellitus without complication ([place])   . Dyslipidemia   . Epilepsy ([place])   . Epilepsy ([place])   . Galactorrhea   . Hx gestational diabetes   . Kidney stones   . Obesity   . Seizures ([place])   . Sleep apnea   . Syncope and collapse   . Tachycardia     Patient Active Problem List   Diagnosis Date Noted  . Vitamin D deficiency 01/31/2017  . Right shoulder tendinitis 01/31/2017  . Tendinosis 01/31/2017  . Bell's palsy 12/19/2016  . Tachycardia 09/30/2016  . Depression with anxiety 05/06/2015  . Allergic rhinitis 04/20/2015  . Anxiety and depression 04/20/2015  . 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 Gastro-esophageal reflux disease without esophagitis 04/20/2015  . Dysmetabolic syndrome 32/95/1884  . Extreme obesity 04/20/2015  . NASH  (nonalcoholic steatohepatitis) 04/20/2015  . Allergy to nuts 04/20/2015  . Calculus of kidney 04/20/2015  . Type 2 diabetes mellitus with renal manifestations ([place]) 04/20/2015  . Central sleep apnea 11/26/2008  . Dyslipidemia 07/02/2008  . Leukocytosis 07/29/2007    Past Surgical History:  Procedure Laterality Date  . CESAREAN SECTION     X 2  . RIGHT OOPHORECTOMY Right 2001   benign tumor  . TUBAL LIGATION  2007     OB History    Gravida  2   Para  2   Term      Preterm      AB      Living        SAB      TAB      Ectopic      Multiple      Live Births           Obstetric Comments  Menstrual age: 72  Age 1st Pregnancy: 46           Home Medications    Prior to Admission medications   Medication Sig Start Date End Date Taking? Authorizing Provider  cholecalciferol (VITAMIN D) 1000 units tablet Take 1 tablet (1,000 Units total) by mouth daily. 12/18/17   Steele Sizer, MD  clonazePAM (KLONOPIN) 0.5 MG tablet Take 0.5 mg by mouth only at the onset of a seizure 08/14/16   [provider]  Continuous Blood Gluc Receiver (FREESTYLE LIBRE [place]) DEVI  1 each by Does not apply route daily. 01/28/18   Steele Sizer, MD  Continuous Blood Gluc Sensor (FREESTYLE LIBRE 14 DAY SENSOR) MISC 1 each by Does not apply route every 14 (fourteen) days. 12/18/17   Steele Sizer, MD  EPINEPHrine (EPIPEN 2-PAK) 0.3 mg/0.3 mL IJ SOAJ injection Inject 0.3 mg into the muscle once as needed (for anaphylaxis).  06/14/14   [provider]  fluticasone (FLONASE) 50 MCG/ACT nasal spray Place 2 sprays into both nostrils daily as needed. 01/28/18   [provider]  Insulin Glargine-Lixisenatide (SOLIQUA) 100-33 UNT-MCG/ML SOPN Inject 22 Units into the skin daily.    [provider]  Insulin Pen Needle (NOVOFINE) 32G X 6 MM MISC 1 each daily by Does not apply route. 09/22/17   Steele Sizer, MD  levocetirizine (XYZAL) 5 MG tablet Take 1  tablet (5 mg total) by mouth every evening. Patient taking differently: Take 5 mg by mouth daily as needed for allergies.  01/28/18   Steele Sizer, MD  Lorcaserin HCl ER (BELVIQ XR) 20 MG TB24 Take 1 tablet by mouth daily. 01/28/18   Steele Sizer, MD  metFORMIN (GLUCOPHAGE XR) 750 MG 24 hr tablet Take 1 tablet (750 mg total) by mouth daily with breakfast. 12/18/17   Ancil Boozer, Drue Stager, MD  montelukast (SINGULAIR) 10 MG tablet Take 1 tablet (10 mg total) by mouth at bedtime. 01/28/18   Steele Sizer, MD  OVER THE COUNTER MEDICATION Take 1 packet by mouth daily. The Nature Made Diabetes Health Pack (Multivitamin, Fish Oil, Vitamin D3, Magnesium,Vitamin C, Alpha Lipoic Acid, Chromium)    [provider]  zonisamide (ZONEGRAN) 100 MG capsule Take 400 mg by mouth at bedtime.     Roque Cash, MD    Family History Family History  Problem Relation Age of Onset  . Diabetes Mother   . Breast cancer Paternal Grandmother 20  . Cancer Paternal Grandmother   . Cancer Paternal Aunt   . Cancer Maternal Grandmother   . Heart disease Neg Hx     Social History Social History   Tobacco Use  . Smoking status: Never Smoker  . Smokeless tobacco: Never Used  Substance Use Topics  . Alcohol use: No  . Drug use: No     Allergies   Peanuts [peanut oil] and Aspirin   Review of Systems Review of Systems  All other systems reviewed and are negative.    Physical Exam Updated Vital Signs BP (!) 101/50   Pulse 93   Temp 98.6 F (37 C) (Oral)   Resp 16   SpO2 98%   Physical Exam  Constitutional: She appears well-developed and well-nourished. No distress.  Obese female nontoxic in appearance  HENT:  Head: Atraumatic.  Eyes: Conjunctivae are normal.  Neck: Neck supple.  Cardiovascular: Normal rate and regular rhythm.  Pulmonary/Chest: Effort normal and breath sounds normal.  Abdominal: Soft. Bowel sounds are normal. She exhibits no distension. There is tenderness (Suprapubic  tenderness without guarding or rebound tenderness.).  Genitourinary:  Genitourinary Comments: No CVA tenderness  Neurological: She is alert.  Skin: No rash noted.  Psychiatric: She has a normal mood and affect.  Nursing note and vitals reviewed.    ED Treatments / Results  Labs (all labs ordered are listed, but only abnormal results are displayed) Labs Reviewed  CBC WITH DIFFERENTIAL/PLATELET - Abnormal; Notable for the following components:      Result Value   WBC 12.8 (*)    MCH 25.3 (*)    Neutro  Abs 8.5 (*)    All other components within normal limits  COMPREHENSIVE METABOLIC PANEL - Abnormal; Notable for the following components:   Glucose, Bld 297 (*)    BUN 5 (*)    All other components within normal limits  LIPASE, BLOOD  URINALYSIS, ROUTINE W REFLEX MICROSCOPIC  POC URINE PREG, ED  I-STAT CG4 LACTIC ACID, ED    EKG None  Radiology No results found.  Procedures Procedures (including critical care time)  Medications Ordered in ED Medications  fentaNYL (SUBLIMAZE) injection 50 mcg (50 mcg Intravenous Not Given 09/10/18 1351)  ondansetron (ZOFRAN) injection 4 mg (4 mg Intravenous Given 09/10/18 1338)  0.9 %  sodium chloride infusion (1,000 mLs Intravenous New Bag/Given 09/10/18 1339)     Initial Impression / Assessment and Plan / ED Course  I have reviewed the triage vital signs and the nursing notes.  Pertinent labs & imaging results that were available during my care of the patient were reviewed by me and considered in my medical decision making (see chart for details).     BP (!) 101/50   Pulse 93   Temp 98.6 F (37 C) (Oral)   Resp 16   SpO2 98%    Final Clinical Impressions(s) / ED Diagnoses   Final diagnoses:  None    ED Discharge Orders    None     1:28 PM Patient initially was treated for urinary tract infection with Macrobid and now she is having back pain and generalized body aches.  Possible pyelonephritis causing her sxs.  Will  check labs including UA.  Pt has low BP of 101/50, will check lactic acid and rectal temp.  Pt will likely benefit from abd/pelvis CT to r/o pyelo.    Care discussed with Janetta Hora, PA-C who will f/u with abd/pelvis CT and determine disposition.    Domenic Moras, PA-C 09/10/18 1514    Carmin Muskrat, MD 09/11/18 [phone]

## 2018-09-10 NOTE — Discharge Instructions (Signed)
Take Keflex three times daily for 10 days for UTI Take Zofran as needed for nausea Please return if you are worsening

## 2018-09-11 LAB — URINE CULTURE: Culture: >=100000 — AB

## 2018-09-12 ENCOUNTER — Other Ambulatory Visit: Payer: Self-pay | Admitting: Family Medicine

## 2018-09-12 ENCOUNTER — Telehealth: Payer: Self-pay | Admitting: *Deleted

## 2018-09-12 NOTE — Telephone Encounter (Signed)
Post ED Visit - Positive Culture Follow-up  Culture report reviewed by antimicrobial stewardship pharmacist:  []  Elenor Quinones, Pharm.D. []  Heide Guile, Pharm.D., BCPS AQ-ID []  Parks Neptune, Pharm.D., BCPS []  Alycia Rossetti, Pharm.D., BCPS []  [place], [place].D., BCPS, AAHIVP []  Legrand Como, Pharm.D., BCPS, AAHIVP []  Salome Arnt, PharmD, BCPS []  Johnnette Gourd, PharmD, BCPS []  Hughes Better, PharmD, BCPS []  Leeroy Cha, PharmD Tamela Gammon, PharmD  Positive urine culture Treated with Cephalexin, organism sensitive to the same and no further patient follow-up is required at this time.  Harlon Flor [facility] 09/12/2018, 9:07 AM

## 2018-09-17 ENCOUNTER — Ambulatory Visit (INDEPENDENT_AMBULATORY_CARE_PROVIDER_SITE_OTHER): Payer: 59 | Admitting: Family Medicine

## 2018-09-17 ENCOUNTER — Encounter: Payer: Self-pay | Admitting: Family Medicine

## 2018-09-17 VITALS — BP 124/86 | HR 100 | Temp 98.4°F | Resp 16 | Ht 66.0 in | Wt 314.4 lb

## 2018-09-17 DIAGNOSIS — E785 Hyperlipidemia, unspecified: Secondary | ICD-10-CM

## 2018-09-17 DIAGNOSIS — N3090 Cystitis, unspecified without hematuria: Secondary | ICD-10-CM

## 2018-09-17 DIAGNOSIS — R809 Proteinuria, unspecified: Secondary | ICD-10-CM

## 2018-09-17 DIAGNOSIS — B354 Tinea corporis: Secondary | ICD-10-CM

## 2018-09-17 DIAGNOSIS — Z794 Long term (current) use of insulin: Secondary | ICD-10-CM

## 2018-09-17 DIAGNOSIS — E1129 Type 2 diabetes mellitus with other diabetic kidney complication: Secondary | ICD-10-CM | POA: Diagnosis not present

## 2018-09-17 MED ORDER — FLUCONAZOLE 150 MG PO TABS: 150.0000 mg | ORAL_TABLET | Freq: Once | ORAL | 1 refills | Status: AC

## 2018-09-17 MED ORDER — INSULIN GLARGINE-LIXISENATIDE 100-33 UNT-MCG/ML ~~LOC~~ SOPN: 22.0000 [IU] | PEN_INJECTOR | Freq: Every day | SUBCUTANEOUS | 0 refills | Status: DC

## 2018-09-17 NOTE — Patient Instructions (Addendum)
Restart Soliqua at 15 units daily for 3 days, then increase by 2 units every 2-3 days until fasting blood sugar is between 80-140.  Maximum dose of 22 units.  If still not at goal at 22 units, call our office.

## 2018-09-17 NOTE — Progress Notes (Signed)
Name: Erin Good   MRN: [id number]    DOB: 1976-03-15   Date:09/17/2018       Progress Note  Subjective  Chief Complaint  Chief Complaint  Patient presents with  . Follow-up    UC and ER for UTI  . Rash    between legs    HPI  08/27/2018 - went to UC for UTI symptoms, took Macrobid x5 days and she did not improve with this. On 09/10/2018 she went to the ER for vomiting, weakness, abdominal pain, and near-syncope.  She had CT abdomen pelvis which showed no pyelonephritis, mildly elevated WBC, normal kidney function, and elevated blood glucose. She was given 1g Rocephin IV and sent home on Keflex 566m TID x10 days. She is still having abdominal discomfort (states it has improved some), and developed a painful rash to her inner thighs.  Denies fevers/chills, no longer having near-syncope or weakness.  DM: BG was quite elevated in the ER.  She has been out of her [place] several weeks.  She was using 22 units, but has been off for so long that we will restart today at starting dose and increase slowly.  Discussed how uncontrolled DM contributes to candida and complex bacterial infections.   Patient Active Problem List   Diagnosis Date Noted  . Vitamin D deficiency 01/31/2017  . Right shoulder tendinitis 01/31/2017  . Tendinosis 01/31/2017  . Bell's palsy 12/19/2016  . Tachycardia 09/30/2016  . Depression with anxiety 05/06/2015  . Allergic rhinitis 04/20/2015  . Anxiety and depression 04/20/2015  . 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 Gastro-esophageal reflux disease without esophagitis 04/20/2015  . Dysmetabolic syndrome 059/56/3875 . Extreme obesity 04/20/2015  . NASH (nonalcoholic steatohepatitis) 04/20/2015  . Allergy to nuts 04/20/2015  . Calculus of kidney 04/20/2015  . Type 2 diabetes mellitus with renal manifestations ([place] 04/20/2015  . Central sleep apnea 11/26/2008  . Dyslipidemia 07/02/2008  . Leukocytosis 07/29/2007    Social History     Tobacco Use  . Smoking status: Never Smoker  . Smokeless tobacco: Never Used  Substance Use Topics  . Alcohol use: No     Current Outpatient Medications:  .  cephALEXin (KEFLEX) 500 MG capsule, Take 1 capsule (500 mg total) by mouth 3 (three) times daily., Disp: 30 capsule, Rfl: 0 .  cholecalciferol (VITAMIN D) 1000 units tablet, Take 1 tablet (1,000 Units total) by mouth daily., Disp: 30 tablet, Rfl: 0 .  clonazePAM (KLONOPIN) 0.5 MG tablet, Take 0.5 mg by mouth only at the onset of a seizure, Disp: , Rfl: 3 .  Continuous Blood Gluc Receiver (FREESTYLE LIBRE 14 DAY READER) DEVI, 1 each by Does not apply route daily., Disp: 1 Device, Rfl: 1 .  Continuous Blood Gluc Sensor (FREESTYLE LIBRE 14 DAY SENSOR) MISC, 1 each by Does not apply route every 14 (fourteen) days., Disp: 2 each, Rfl: 5 .  EPINEPHrine (EPIPEN 2-PAK) 0.3 mg/0.3 mL IJ SOAJ injection, Inject 0.3 mg into the muscle once as needed (for anaphylaxis). , Disp: , Rfl:  .  fluticasone (FLONASE) 50 MCG/ACT nasal spray, Place 2 sprays into both nostrils daily as needed., Disp: , Rfl: 12 .  Insulin Glargine-Lixisenatide (SOLIQUA) 100-33 UNT-MCG/ML SOPN, Inject 22 Units into the skin daily., Disp: , Rfl:  .  Insulin Pen Needle (NOVOFINE) 32G X 6 MM MISC, 1 each daily by Does not apply route., Disp: 100 each, Rfl: 0 .  levocetirizine (XYZAL) 5 MG tablet, Take 1 tablet (5  mg total) by mouth every evening. (Patient taking differently: Take 5 mg by mouth daily as needed for allergies. ), Disp: 30 tablet, Rfl: 2 .  Lorcaserin HCl ER (BELVIQ XR) 20 MG TB24, Take 1 tablet by mouth daily., Disp: 30 tablet, Rfl: 2 .  metFORMIN (GLUCOPHAGE XR) 750 MG 24 hr tablet, Take 1 tablet (750 mg total) by mouth daily with breakfast., Disp: 60 tablet, Rfl: 2 .  montelukast (SINGULAIR) 10 MG tablet, Take 1 tablet (10 mg total) by mouth at bedtime., Disp: 30 tablet, Rfl: 2 .  ondansetron (ZOFRAN ODT) 4 MG disintegrating tablet, Take 1 tablet (4 mg total) by  mouth every 8 (eight) hours as needed for nausea or vomiting., Disp: 8 tablet, Rfl: 0 .  OVER THE COUNTER MEDICATION, Take 1 packet by mouth daily. The Nature Made Diabetes Health Pack (Multivitamin, Fish Oil, Vitamin D3, Magnesium,Vitamin C, Alpha Lipoic Acid, Chromium), Disp: , Rfl:  .  zonisamide (ZONEGRAN) 100 MG capsule, Take 400 mg by mouth at bedtime. , Disp: , Rfl:   Allergies  Allergen Reactions  . Peanuts [Peanut Oil] Anaphylaxis  . Aspirin Other (See Comments)    Does take because of her epilepsy/seizure     I personally reviewed active problem list, medication list, allergies, lab results with the patient/caregiver today.  ROS  Constitutional: Negative for fever or weight change.  Respiratory: Negative for cough and shortness of breath.   Cardiovascular: Negative for chest pain or palpitations.  Gastrointestinal: Positive for abdominal pain, no bowel changes.  Musculoskeletal: Negative for gait problem or joint swelling.  Skin: Positive for rash.  Neurological: Negative for dizziness or headache.  No other specific complaints in a complete review of systems (except as listed in HPI above).   Objective  Vitals:   09/17/18 0719  BP: 124/86  Pulse: 100  Resp: 16  Temp: 98.4 F (36.9 C)  TempSrc: Oral  SpO2: 97%  Weight: (!) 314 lb 6.4 oz (142.6 kg)  Height: 5' 6"  (1.676 m)   Body mass index is 50.75 kg/m.  Nursing Note and Vital Signs reviewed.  Physical Exam  Constitutional: Patient appears well-developed and well-nourished. No distress.  HENT: Head: Normocephalic and atraumatic. Eyes: Conjunctivae and EOM are normal. No scleral icterus.  Pupils are equal, round, and reactive to light.  Neck: Normal range of motion. Neck supple. No JVD present. No thyromegaly present.  Cardiovascular: Normal rate, regular rhythm and normal heart sounds.  No murmur heard. No BLE edema. Pulmonary/Chest: Effort normal and breath sounds normal. No respiratory  distress. Abdominal: Soft. Bowel sounds are normal, no distension. There is no tenderness. No masses are palpable. No CVA tenderness Musculoskeletal: Normal range of motion, no joint effusions. No gross deformities Neurological: Pt is alert and oriented to person, place, and time. No cranial nerve deficit. Coordination, balance, strength, speech and gait are normal.  Skin: Skin is warm and dry. Erythematous, scaling rash to bilateral inner thighs, no vaginal involvement. Psychiatric: Patient has a normal mood and affect. behavior is normal. Judgment and thought content normal.  No results found for this or any previous visit (from the past 72 hour(s)).  Assessment & Plan  1. Cystitis without hematuria Her symptoms are improving, so we will maintain the Keflex for the time being.  If she is worsening or failing to continue improvement by Friday 09/19/18, we will consider switching abx - she will call if not improving/worsening symptoms.  2. Type 2 diabetes mellitus with microalbuminuria, with long-term current use of  insulin ([place]) - Discussed DM care in detail, how this contributes to complex infections and yeast infections. She verbalizes understanding. We will restart soliqua and work back up to 22 units. - Insulin Glargine-Lixisenatide (SOLIQUA) 100-33 UNT-MCG/ML SOPN; Inject 22 Units into the skin daily.  Dispense: 9 mL; Refill: 0 - Hemoglobin A1c  3. Tinea corporis - fluconazole (DIFLUCAN) 150 MG tablet; Take 1 tablet (150 mg total) by mouth once for 1 dose. May repeat in 72 hours if not improving.  Dispense: 1 tablet; Refill: 1  4. Dyslipidemia - Lipid panel  -Red flags and when to present for emergency care or RTC including fever >101.91F, chest pain, shortness of breath, new/worsening/un-resolving symptoms, frank hematuria reviewed with patient at time of visit. Follow up and care instructions discussed and provided in AVS.

## 2018-10-05 ENCOUNTER — Other Ambulatory Visit: Payer: Self-pay | Admitting: Family Medicine

## 2018-10-05 DIAGNOSIS — Z794 Long term (current) use of insulin: Principal | ICD-10-CM

## 2018-10-05 DIAGNOSIS — R809 Proteinuria, unspecified: Principal | ICD-10-CM

## 2018-10-05 DIAGNOSIS — E1129 Type 2 diabetes mellitus with other diabetic kidney complication: Secondary | ICD-10-CM

## 2018-10-07 DIAGNOSIS — J0100 Acute maxillary sinusitis, unspecified: Secondary | ICD-10-CM | POA: Diagnosis not present

## 2018-10-07 DIAGNOSIS — E119 Type 2 diabetes mellitus without complications: Secondary | ICD-10-CM | POA: Diagnosis not present

## 2018-10-07 DIAGNOSIS — B373 Candidiasis of vulva and vagina: Secondary | ICD-10-CM | POA: Diagnosis not present

## 2018-12-16 DIAGNOSIS — E119 Type 2 diabetes mellitus without complications: Secondary | ICD-10-CM | POA: Diagnosis not present

## 2018-12-16 DIAGNOSIS — H109 Unspecified conjunctivitis: Secondary | ICD-10-CM | POA: Diagnosis not present

## 2018-12-16 DIAGNOSIS — R5383 Other fatigue: Secondary | ICD-10-CM | POA: Diagnosis not present

## 2018-12-16 DIAGNOSIS — N898 Other specified noninflammatory disorders of vagina: Secondary | ICD-10-CM | POA: Diagnosis not present

## 2018-12-17 DIAGNOSIS — N898 Other specified noninflammatory disorders of vagina: Secondary | ICD-10-CM | POA: Diagnosis not present

## 2018-12-21 ENCOUNTER — Ambulatory Visit (INDEPENDENT_AMBULATORY_CARE_PROVIDER_SITE_OTHER): Payer: 59

## 2018-12-21 ENCOUNTER — Other Ambulatory Visit: Payer: Self-pay

## 2018-12-21 ENCOUNTER — Ambulatory Visit (HOSPITAL_COMMUNITY)
Admission: EM | Admit: 2018-12-21 | Discharge: 2018-12-21 | Disposition: A | Discharge status: Home / Self Care | Payer: 59 | Attending: Family Medicine | Admitting: Family Medicine

## 2018-12-21 ENCOUNTER — Encounter (HOSPITAL_COMMUNITY): Payer: Self-pay | Admitting: *Deleted

## 2018-12-21 DIAGNOSIS — J111 Influenza due to unidentified influenza virus with other respiratory manifestations: Secondary | ICD-10-CM

## 2018-12-21 DIAGNOSIS — R05 Cough: Secondary | ICD-10-CM | POA: Diagnosis not present

## 2018-12-21 DIAGNOSIS — R69 Illness, unspecified: Secondary | ICD-10-CM

## 2018-12-21 HISTORY — DX: Simple febrile convulsions: R56.00

## 2018-12-21 MED ORDER — OSELTAMIVIR PHOSPHATE 75 MG PO CAPS: 75.0000 mg | ORAL_CAPSULE | Freq: Two times a day (BID) | ORAL | 0 refills | Status: DC

## 2018-12-21 NOTE — ED Provider Notes (Signed)
[place]   [id number] 12/21/18 Arrival Time: 1108   CC: URI symptoms   SUBJECTIVE: History from: patient.  Erin Good is a 43 y.o. female who presents with abrupt onset of congestion, runny nose, cough, body aches, and fever with tmax of 103.  Denies known sick exposure or precipitating event.  Has tried OTC medications without relief.  Denies aggravating factors.  Reports previous symptoms in the past and diagnosed with pneumonia.   Denies sinus pain, SOB, wheezing, chest pain, nausea, changes in bowel or bladder habits.    Received flu shot this year: no.  ROS: As per HPI.  Past Medical History:  Diagnosis Date  . Allergic rhinitis   . Breast discharge 06/05/2017   2 weeks ago left  . Breast mass 12/06/2016   left  . Diabetes mellitus without complication ([place])   . Dyslipidemia   . Epilepsy ([place])   . Epilepsy ([place])   . Febrile seizures ([place])   . Galactorrhea   . Hx gestational diabetes   . Kidney stones   . Obesity   . Seizures ([place])   . Sleep apnea   . Syncope and collapse   . Tachycardia    Past Surgical History:  Procedure Laterality Date  . CESAREAN SECTION     X 2  . RIGHT OOPHORECTOMY Right 2001   benign tumor  . TUBAL LIGATION  2007   Allergies  Allergen Reactions  . Peanuts [Peanut Oil] Anaphylaxis  . Aspirin Other (See Comments)    Does take because of her epilepsy/seizure    No current facility-administered medications on file prior to encounter.    Current Outpatient Medications on File Prior to Encounter  Medication Sig Dispense Refill  . cholecalciferol (VITAMIN D) 1000 units tablet Take 1 tablet (1,000 Units total) by mouth daily. 30 tablet 0  . clonazePAM (KLONOPIN) 0.5 MG tablet Take 0.5 mg by mouth only at the onset of a seizure  3  . Insulin Glargine-Lixisenatide (SOLIQUA) 100-33 UNT-MCG/ML SOPN Inject 22 Units into the skin daily. 9 mL 0  . OVER THE COUNTER MEDICATION Take 1 packet by mouth daily. The Nature Made  Diabetes Health Pack (Multivitamin, Fish Oil, Vitamin D3, Magnesium,Vitamin C, Alpha Lipoic Acid, Chromium)    . zonisamide (ZONEGRAN) 100 MG capsule Take 400 mg by mouth at bedtime.     . cephALEXin (KEFLEX) 500 MG capsule Take 1 capsule (500 mg total) by mouth 3 (three) times daily. 30 capsule 0  . Continuous Blood Gluc Receiver (FREESTYLE LIBRE 14 DAY READER) DEVI 1 each by Does not apply route daily. 1 Device 1  . Continuous Blood Gluc Sensor (FREESTYLE LIBRE 14 DAY SENSOR) MISC 1 each by Does not apply route every 14 (fourteen) days. 2 each 5  . EPINEPHrine (EPIPEN 2-PAK) 0.3 mg/0.3 mL IJ SOAJ injection Inject 0.3 mg into the muscle once as needed (for anaphylaxis).     . fluticasone (FLONASE) 50 MCG/ACT nasal spray Place 2 sprays into both nostrils daily as needed.  12  . Insulin Pen Needle (NOVOFINE) 32G X 6 MM MISC 1 each daily by Does not apply route. 100 each 0  . levocetirizine (XYZAL) 5 MG tablet Take 1 tablet (5 mg total) by mouth every evening. (Patient taking differently: Take 5 mg by mouth daily as needed for allergies. ) 30 tablet 2  . Lorcaserin HCl ER (BELVIQ XR) 20 MG TB24 Take 1 tablet by mouth daily. 30 tablet 2  . metFORMIN (GLUCOPHAGE XR) 750  MG 24 hr tablet Take 1 tablet (750 mg total) by mouth daily with breakfast. 60 tablet 2  . montelukast (SINGULAIR) 10 MG tablet Take 1 tablet (10 mg total) by mouth at bedtime. 30 tablet 2  . ondansetron (ZOFRAN ODT) 4 MG disintegrating tablet Take 1 tablet (4 mg total) by mouth every 8 (eight) hours as needed for nausea or vomiting. 8 tablet 0   Social History   Socioeconomic History  . Marital status: Married    Spouse name: Roderic Palau  . Number of children: 2  . Years of education: College  . Highest education level: Not on file  Occupational History  . Occupation: Optometrist  Social Needs  . Financial resource strain: Not on file  . Food insecurity:    Worry: Not on file    Inability: Not on file  . Transportation needs:     Medical: Not on file    Non-medical: Not on file  Tobacco Use  . Smoking status: Never Smoker  . Smokeless tobacco: Never Used  Substance and Sexual Activity  . Alcohol use: No  . Drug use: No  . Sexual activity: Not on file  Lifestyle  . Physical activity:    Days per week: Not on file    Minutes per session: Not on file  . Stress: Not on file  Relationships  . Social connections:    Talks on phone: Not on file    Gets together: Not on file    Attends religious service: Not on file    Active member of club or organization: Not on file    Attends meetings of clubs or organizations: Not on file    Relationship status: Not on file  . Intimate partner violence:    Fear of current or ex partner: Not on file    Emotionally abused: Not on file    Physically abused: Not on file    Forced sexual activity: Not on file  Other Topics Concern  . Not on file  Social History Narrative  . Not on file   Family History  Problem Relation Age of Onset  . Diabetes Mother   . Breast cancer Paternal Grandmother 46  . Cancer Paternal Grandmother   . Cancer Paternal Aunt   . Cancer Maternal Grandmother   . Heart disease Neg Hx     OBJECTIVE:  Vitals:   12/21/18 1133  BP: (!) 141/57  Pulse: (!) 130  Resp: (!) 22  Temp: 99.4 F (37.4 C)  TempSrc: Oral  SpO2: 99%     General appearance: alert; appears fatigued, but nontoxic; speaking in full sentences and tolerating own secretions HEENT: NCAT; Ears: EACs clear, TMs pearly gray; Eyes: PERRL.  EOM grossly intact. Sinuses: nontender; Nose: nares patent without rhinorrhea, Throat: oropharynx clear, tonsils non erythematous or enlarged, uvula midline  Neck: supple without LAD Lungs: unlabored respirations, symmetrical air entry; cough: mild; no respiratory distress; CTAB Heart: regular rate and rhythm.  Radial pulses 2+ symmetrical bilaterally Skin: warm and dry Psychological: alert and cooperative; normal mood and affect  DIAGNOSTIC  STUDIES:  Dg Chest 2 View  Result Date: 12/21/2018 CLINICAL DATA:  Fever and cough for 3 days. EXAM: CHEST - 2 VIEW COMPARISON:  Radiographs 06/07/2016. FINDINGS: The heart size and mediastinal contours are normal. The lungs are clear. There is no pleural effusion or pneumothorax. No acute osseous findings are identified. IMPRESSION: Stable chest.  No active cardiopulmonary process. Electronically Signed   By: Caryl Comes.D.  On: 12/21/2018 13:03     ASSESSMENT & PLAN:  1. Influenza-like illness     Meds ordered this encounter  Medications  . oseltamivir (TAMIFLU) 75 MG capsule    Sig: Take 1 capsule (75 mg total) by mouth every 12 (twelve) hours.    Dispense:  10 capsule    Refill:  0    Order Specific Question:   Supervising Provider    Answer:   Raylene Everts [[id number]]   Chest x-ray does not show pneumonia or bronchitis.  Pending radiology review.  We will follow up with you regarding abnormal findings. Get plenty of rest and push fluids You may use OTC zyrtec and/or flonase as needed for runny nose and/or congestion Tamiflu prescribed.  Take as directed and to completion Continue to alternate ibuprofen and/or tylenol as needed for fever, body aches, and pain Follow up with PCP this week to ensure symptoms are improving Return or go to ER if you have any new or worsening symptoms fever, chills, nausea, vomiting, chest pain, cough, shortness of breath, worsening cough, wheezing, abdominal pain, changes in bowel or bladder habits, fever that does not moderate with medication, etc...  Reviewed expectations re: course of current medical issues. Questions answered. Outlined signs and symptoms indicating need for more acute intervention. Patient verbalized understanding. After Visit Summary given.         Lestine Box, PA-C 12/21/18 1604

## 2018-12-21 NOTE — ED Triage Notes (Signed)
C/O starting with fever and productive cough up to 103, 2 days ago; has been taking Tyl and Sudafed.  C/O "my lungs feel sore".  Last dose Tyl @ 0600 today.

## 2018-12-21 NOTE — Discharge Instructions (Addendum)
Chest x-ray does not show pneumonia or bronchitis.  Pending radiology review.  We will follow up with you regarding abnormal findings. Get plenty of rest and push fluids You may use OTC zyrtec and/or flonase as needed for runny nose and/or congestion Tamiflu prescribed.  Take as directed and to completion Continue to alternate ibuprofen and/or tylenol as needed for fever, body aches, and pain Follow up with PCP this week to ensure symptoms are improving Return or go to ER if you have any new or worsening symptoms fever, chills, nausea, vomiting, chest pain, cough, shortness of breath, worsening cough, wheezing, abdominal pain, changes in bowel or bladder habits, fever that does not moderate with medication, etc..Marland Kitchen

## 2019-01-23 ENCOUNTER — Telehealth: Payer: Self-pay | Admitting: Family Medicine

## 2019-01-23 DIAGNOSIS — Z794 Long term (current) use of insulin: Principal | ICD-10-CM

## 2019-01-23 DIAGNOSIS — R809 Proteinuria, unspecified: Principal | ICD-10-CM

## 2019-01-23 DIAGNOSIS — E1129 Type 2 diabetes mellitus with other diabetic kidney complication: Secondary | ICD-10-CM

## 2019-01-25 NOTE — Telephone Encounter (Signed)
Please call patient to schedule a follow up in the next 1-2 weeks.

## 2019-01-26 NOTE — Telephone Encounter (Signed)
Appt made for 3.31.2020 with Erin Good

## 2019-01-28 ENCOUNTER — Telehealth: Payer: Self-pay | Admitting: Family Medicine

## 2019-01-28 NOTE — Telephone Encounter (Signed)
Sorry correction generic flonase. Pt has an appt on 02-03-2019

## 2019-01-28 NOTE — Telephone Encounter (Signed)
Refill request for general medication. Flonase to [facility]  Last office visit 09/17/2018   Follow up on 02/03/2019

## 2019-01-28 NOTE — Telephone Encounter (Signed)
appt made for tomorrow morning

## 2019-01-28 NOTE — Telephone Encounter (Addendum)
Copied from [place] [id number]. Topic: General - Other >> Jan 28, 2019  9:50 AM Lennox Solders wrote: Reason for CRM: pt left on refill voice mail. Pt would like generic flonase send to [facility] [address]

## 2019-01-29 ENCOUNTER — Encounter: Payer: 59 | Admitting: Obstetrics and Gynecology

## 2019-01-29 ENCOUNTER — Other Ambulatory Visit: Payer: Self-pay

## 2019-01-29 ENCOUNTER — Encounter: Payer: Self-pay | Admitting: Family Medicine

## 2019-01-29 ENCOUNTER — Ambulatory Visit (INDEPENDENT_AMBULATORY_CARE_PROVIDER_SITE_OTHER): Payer: 59 | Admitting: Family Medicine

## 2019-01-29 VITALS — BP 128/76 | HR 94 | Temp 98.4°F | Resp 16 | Ht 66.0 in | Wt 310.8 lb

## 2019-01-29 DIAGNOSIS — E1129 Type 2 diabetes mellitus with other diabetic kidney complication: Secondary | ICD-10-CM | POA: Diagnosis not present

## 2019-01-29 DIAGNOSIS — E559 Vitamin D deficiency, unspecified: Secondary | ICD-10-CM

## 2019-01-29 DIAGNOSIS — B3731 Acute candidiasis of vulva and vagina: Secondary | ICD-10-CM

## 2019-01-29 DIAGNOSIS — G40409 Other generalized epilepsy and epileptic syndromes, not intractable, without status epilepticus: Secondary | ICD-10-CM

## 2019-01-29 DIAGNOSIS — Z79899 Other long term (current) drug therapy: Secondary | ICD-10-CM

## 2019-01-29 DIAGNOSIS — B373 Candidiasis of vulva and vagina: Secondary | ICD-10-CM

## 2019-01-29 DIAGNOSIS — J301 Allergic rhinitis due to pollen: Secondary | ICD-10-CM

## 2019-01-29 DIAGNOSIS — E785 Hyperlipidemia, unspecified: Secondary | ICD-10-CM | POA: Diagnosis not present

## 2019-01-29 DIAGNOSIS — E1169 Type 2 diabetes mellitus with other specified complication: Secondary | ICD-10-CM | POA: Diagnosis not present

## 2019-01-29 DIAGNOSIS — Z1239 Encounter for other screening for malignant neoplasm of breast: Secondary | ICD-10-CM

## 2019-01-29 DIAGNOSIS — R809 Proteinuria, unspecified: Secondary | ICD-10-CM | POA: Diagnosis not present

## 2019-01-29 DIAGNOSIS — Z794 Long term (current) use of insulin: Secondary | ICD-10-CM

## 2019-01-29 LAB — POCT GLYCOSYLATED HEMOGLOBIN (HGB A1C): Hemoglobin A1C: 10 % — AB (ref 4.0–5.6)

## 2019-01-29 MED ORDER — FLUCONAZOLE 150 MG PO TABS: 150.0000 mg | ORAL_TABLET | ORAL | 0 refills | Status: DC

## 2019-01-29 MED ORDER — MONTELUKAST SODIUM 10 MG PO TABS: 10.0000 mg | ORAL_TABLET | Freq: Every day | ORAL | 2 refills | Status: DC

## 2019-01-29 MED ORDER — INSULIN GLARGINE-LIXISENATIDE 100-33 UNT-MCG/ML ~~LOC~~ SOPN: 50.0000 [IU] | PEN_INJECTOR | Freq: Every day | SUBCUTANEOUS | 0 refills | Status: DC

## 2019-01-29 MED ORDER — METFORMIN HCL ER 750 MG PO TB24: 1500.0000 mg | ORAL_TABLET | Freq: Every day | ORAL | 0 refills | Status: DC

## 2019-01-29 MED ORDER — INSULIN PEN NEEDLE 32G X 6 MM MISC: 1.0000 | Freq: Every day | 0 refills | Status: DC

## 2019-01-29 NOTE — Progress Notes (Signed)
Name: Erin Good   MRN: [id number]    DOB: January 27, 1976   Date:01/29/2019       Progress Note  Subjective  Chief Complaint  Chief Complaint  Patient presents with  . Medication Refill    HPI  DMII: she was seen in Feb 2018 with Bell's palsy and was not using any of her medications at the time. hgbA1C 9.9 10.3 , 10.9%, 10.4%9.4%,  9.8%, 10.4%, and now is 10 %  , explained long term risk of uncontrolled DM. She is afraid of insulin because her mother died from hypoglycemia. She has not been checking glucose at home. She states she eats very healthy, switching from sodas to sparkling water. She needs to titrate soliqua as recommended, we will resume metformin and she will start checking glucose with free style libre  Seizure disorder: she sees Dr. Lysle Rubens, had one seizure Summer 2017, she is doing well since. Taking medication as prescribed. She is on higher dose of medication since episode,she finished rx vitamin D and is currently taking otc supplementation, we will recheck Vitamin D level and other labs today   Obesity: weight is stable, she states she eats smaller portions, she is trying to avoid carbohydrates. Drinking more water, Diet sodas or sparking water. She lost 5 lbs since last visit  Dyslipidemia: she does not like taking medication. No chest pain or palpitation   Patient Active Problem List   Diagnosis Date Noted  . Vitamin D deficiency 01/31/2017  . Right shoulder tendinitis 01/31/2017  . Tendinosis 01/31/2017  . Bell's palsy 12/19/2016  . Tachycardia 09/30/2016  . Depression with anxiety 05/06/2015  . Allergic rhinitis 04/20/2015  . Anxiety and depression 04/20/2015  . 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 Gastro-esophageal reflux disease without esophagitis 04/20/2015  . Dysmetabolic syndrome 54/62/7035  . Extreme obesity 04/20/2015  . NASH (nonalcoholic steatohepatitis) 04/20/2015  . Allergy to nuts 04/20/2015  . Calculus of kidney  04/20/2015  . Type 2 diabetes mellitus with renal manifestations ([place]) 04/20/2015  . Central sleep apnea 11/26/2008  . Dyslipidemia 07/02/2008  . Leukocytosis 07/29/2007    Past Surgical History:  Procedure Laterality Date  . CESAREAN SECTION     X 2  . RIGHT OOPHORECTOMY Right 2001   benign tumor  . TUBAL LIGATION  2007    Family History  Problem Relation Age of Onset  . Diabetes Mother   . Breast cancer Paternal Grandmother 96  . Cancer Paternal Grandmother   . Cancer Paternal Aunt   . Cancer Maternal Grandmother   . Heart disease Neg Hx     Social History   Socioeconomic History  . Marital status: Married    Spouse name: Roderic Palau  . Number of children: 2  . Years of education: College  . Highest education level: Not on file  Occupational History  . Occupation: Optometrist  Social Needs  . Financial resource strain: Not hard at all  . Food insecurity:    Worry: Never true    Inability: Never true  . Transportation needs:    Medical: No    Non-medical: No  Tobacco Use  . Smoking status: Never Smoker  . Smokeless tobacco: Never Used  Substance and Sexual Activity  . Alcohol use: No  . Drug use: No  . Sexual activity: Not on file  Lifestyle  . Physical activity:    Days per week: 0 days    Minutes per session: 0 min  . Stress: Only a little  Relationships  .  Social connections:    Talks on phone: More than three times a week    Gets together: More than three times a week    Attends religious service: More than 4 times per year    Active member of club or organization: Yes    Attends meetings of clubs or organizations: More than 4 times per year    Relationship status: Married  . Intimate partner violence:    Fear of current or ex partner: No    Emotionally abused: No    Physically abused: No    Forced sexual activity: No  Other Topics Concern  . Not on file  Social History Narrative  . Not on file     Current Outpatient Medications:  .   cholecalciferol (VITAMIN D) 1000 units tablet, Take 1 tablet (1,000 Units total) by mouth daily., Disp: 30 tablet, Rfl: 0 .  clonazePAM (KLONOPIN) 0.5 MG tablet, Take 0.5 mg by mouth only at the onset of a seizure, Disp: , Rfl: 3 .  Continuous Blood Gluc Sensor (FREESTYLE LIBRE 14 DAY SENSOR) MISC, 1 each by Does not apply route every 14 (fourteen) days., Disp: 2 each, Rfl: 5 .  EPINEPHrine (EPIPEN 2-PAK) 0.3 mg/0.3 mL IJ SOAJ injection, Inject 0.3 mg into the muscle once as needed (for anaphylaxis). , Disp: , Rfl:  .  fluticasone (FLONASE) 50 MCG/ACT nasal spray, Place 2 sprays into both nostrils daily as needed., Disp: , Rfl: 12 .  Insulin Glargine-Lixisenatide (SOLIQUA) 100-33 UNT-MCG/ML SOPN, Inject 50 Units into the skin daily., Disp: 15 pen, Rfl: 0 .  Insulin Pen Needle (NOVOFINE) 32G X 6 MM MISC, 1 each by Does not apply route daily., Disp: 100 each, Rfl: 0 .  metFORMIN (GLUCOPHAGE XR) 750 MG 24 hr tablet, Take 2 tablets (1,500 mg total) by mouth daily., Disp: 180 tablet, Rfl: 0 .  montelukast (SINGULAIR) 10 MG tablet, Take 1 tablet (10 mg total) by mouth at bedtime., Disp: 30 tablet, Rfl: 2 .  OVER THE COUNTER MEDICATION, Take 1 packet by mouth daily. The Nature Made Diabetes Health Pack (Multivitamin, Fish Oil, Vitamin D3, Magnesium,Vitamin C, Alpha Lipoic Acid, Chromium), Disp: , Rfl:  .  zonisamide (ZONEGRAN) 100 MG capsule, Take 400 mg by mouth at bedtime. , Disp: , Rfl:  .  fluconazole (DIFLUCAN) 150 MG tablet, Take 1 tablet (150 mg total) by mouth every other day., Disp: 3 tablet, Rfl: 0  Allergies  Allergen Reactions  . Peanuts [Peanut Oil] Anaphylaxis  . Aspirin Other (See Comments)    Does take because of her epilepsy/seizure     I personally reviewed active problem list, medication list, allergies, family history, social history with the patient/caregiver today.   ROS  Constitutional: Negative for fever or weight change.  Respiratory: Negative for cough and shortness of  breath.   Cardiovascular: Negative for chest pain or palpitations.  Gastrointestinal: Negative for abdominal pain, no bowel changes.  Musculoskeletal: Negative for gait problem or joint swelling.  Skin: Negative for rash.  Neurological: Negative for dizziness or headache.  No other specific complaints in a complete review of systems (except as listed in HPI above).  Objective  Vitals:   01/29/19 0815  BP: 128/76  Pulse: 94  Resp: 16  Temp: 98.4 F (36.9 C)  TempSrc: Oral  SpO2: 99%  Weight: (!) 310 lb 12.8 oz (141 kg)  Height: 5' 6"  (1.676 m)    Body mass index is 50.16 kg/m.  Physical Exam   Constitutional: Patient appears  well-developed and well-nourished. Obese  No distress.  HEENT: head atraumatic, normocephalic, pupils equal and reactive to light, neck supple, throat within normal limits Cardiovascular: Normal rate, regular rhythm and normal heart sounds.  No murmur heard. No BLE edema. Pulmonary/Chest: Effort normal and breath sounds normal. No respiratory distress. Abdominal: Soft.  There is no tenderness. Psychiatric: Patient has a normal mood and affect. behavior is normal. Judgment and thought content normal.  Recent Results (from the past 2160 hour(s))  POCT HgB A1C     Status: Abnormal   Collection Time: 01/29/19  8:43 AM  Result Value Ref Range   Hemoglobin A1C 10.0 (A) 4.0 - 5.6 %   HbA1c POC (<> result, manual entry)     HbA1c, POC (prediabetic range)     HbA1c, POC (controlled diabetic range)      Diabetic Foot Exam: Diabetic Foot Exam - Simple   Simple Foot Form Diabetic Foot exam was performed with the following findings:  Yes 01/29/2019  8:43 AM  Visual Inspection No deformities, no ulcerations, no other skin breakdown bilaterally:  Yes Sensation Testing Intact to touch and monofilament testing bilaterally:  Yes Pulse Check Posterior Tibialis and Dorsalis pulse intact bilaterally:  Yes Comments      PHQ2/9: Depression screen [facility] 2/9  09/17/2018 04/01/2018 01/31/2017 12/19/2016 09/21/2016  Decreased Interest 0 1 0 0 0  Down, Depressed, Hopeless 0 0 0 0 0  PHQ - 2 Score 0 1 0 0 0  Altered sleeping 0 0 - - -  Tired, decreased energy 0 2 - - -  Change in appetite 0 1 - - -  Feeling bad or failure about yourself  0 0 - - -  Trouble concentrating 0 0 - - -  Moving slowly or fidgety/restless 0 1 - - -  Suicidal thoughts 0 0 - - -  PHQ-9 Score 0 5 - - -  Difficult doing work/chores Not difficult at all Not difficult at all - - -    Fall Risk: Fall Risk  01/29/2019 09/17/2018 04/01/2018 12/18/2017 09/17/2017  Falls in the past year? 0 0 No No No  Number falls in past yr: 0 - - - -  Injury with Fall? 0 - - - -  Comment - - - - -    Assessment & Plan  1. Type 2 diabetes mellitus with microalbuminuria, with long-term current use of insulin (HCC)  - POCT HgB A1C - Urine Microalbumin w/creat. ratio - Insulin Glargine-Lixisenatide (SOLIQUA) 100-33 UNT-MCG/ML SOPN; Inject 50 Units into the skin daily.  Dispense: 15 pen; Refill: 0 - metFORMIN (GLUCOPHAGE XR) 750 MG 24 hr tablet; Take 2 tablets (1,500 mg total) by mouth daily.  Dispense: 180 tablet; Refill: 0  2. Seasonal allergic rhinitis due to pollen  - montelukast (SINGULAIR) 10 MG tablet; Take 1 tablet (10 mg total) by mouth at bedtime.  Dispense: 30 tablet; Refill: 2  3. Morbid obesity ([place])  Discussed with the patient the risk posed by an increased BMI. Discussed importance of portion control, calorie counting and at least 150 minutes of physical activity weekly. Avoid sweet beverages and drink more water. Eat at least 6 servings of fruit and vegetables daily   4. Dyslipidemia associated with type 2 diabetes mellitus (HCC)  - metFORMIN (GLUCOPHAGE XR) 750 MG 24 hr tablet; Take 2 tablets (1,500 mg total) by mouth daily.  Dispense: 180 tablet; Refill: 0  5. 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 Dyslipidemia  -  Lipid  panel  7. Vitamin D deficiency  - VITAMIN D 25 Hydroxy (Vit-D Deficiency, Fractures)  8. Breast cancer screening  - MM Digital Screening; Future  9. Long-term use of high-risk medication  - COMPLETE METABOLIC PANEL WITH GFR - CBC with Differential/Platelet  10. Yeast vaginitis  - fluconazole (DIFLUCAN) 150 MG tablet; Take 1 tablet (150 mg total) by mouth every other day.  Dispense: 3 tablet; Refill: 0

## 2019-01-30 LAB — COMPLETE METABOLIC PANEL WITH GFR
AG Ratio: 1.3 (calc) (ref 1.0–2.5)
ALBUMIN MSPROF: 4.3 g/dL (ref 3.6–5.1)
ALT: 15 U/L (ref 6–29)
AST: 14 U/L (ref 10–30)
Alkaline phosphatase (APISO): 106 U/L (ref 31–125)
BUN: 13 mg/dL (ref 7–25)
CALCIUM: 9.6 mg/dL (ref 8.6–10.2)
CO2: 21 mmol/L (ref 20–32)
CREATININE: 0.66 mg/dL (ref 0.50–1.10)
Chloride: 102 mmol/L (ref 98–110)
GFR, Est African American: 127 mL/min/{1.73_m2} (ref >=60)
GFR, Est Non African American: 110 mL/min/{1.73_m2} (ref >=60)
GLOBULIN: 3.4 g/dL (ref 1.9–3.7)
Glucose, Bld: 288 mg/dL — ABNORMAL HIGH (ref 65–99)
Potassium: 4.3 mmol/L (ref 3.5–5.3)
SODIUM: 134 mmol/L — AB (ref 135–146)
Total Bilirubin: 0.3 mg/dL (ref 0.2–1.2)
Total Protein: 7.7 g/dL (ref 6.1–8.1)

## 2019-01-30 LAB — CBC WITH DIFFERENTIAL/PLATELET
ABSOLUTE MONOCYTES: 615 {cells}/uL (ref 200–950)
BASOS PCT: 0.3 %
Basophils Absolute: 43 cells/uL (ref 0–200)
Eosinophils Absolute: 14 cells/uL — ABNORMAL LOW (ref 15–500)
Eosinophils Relative: 0.1 %
HCT: 41.4 % (ref 35.0–45.0)
Hemoglobin: 13.5 g/dL (ref 11.7–15.5)
Lymphs Abs: 2960 cells/uL (ref 850–3900)
MCH: 25.7 pg — ABNORMAL LOW (ref 27.0–33.0)
MCHC: 32.6 g/dL (ref 32.0–36.0)
MCV: 78.7 fL — ABNORMAL LOW (ref 80.0–100.0)
MPV: 11.8 fL (ref 7.5–12.5)
Monocytes Relative: 4.3 %
Neutro Abs: 10668 cells/uL — ABNORMAL HIGH (ref 1500–7800)
Neutrophils Relative %: 74.6 %
PLATELETS: 338 10*3/uL (ref 140–400)
RBC: 5.26 10*6/uL — ABNORMAL HIGH (ref 3.80–5.10)
RDW: 14.2 % (ref 11.0–15.0)
TOTAL LYMPHOCYTE: 20.7 %
WBC: 14.3 10*3/uL — ABNORMAL HIGH (ref 3.8–10.8)

## 2019-01-30 LAB — LIPID PANEL
Cholesterol: 219 mg/dL — ABNORMAL HIGH (ref <200)
HDL: 42 mg/dL — AB (ref >=50)
LDL Cholesterol (Calc): 154 mg/dL (calc) — ABNORMAL HIGH
Non-HDL Cholesterol (Calc): 177 mg/dL (calc) — ABNORMAL HIGH (ref <130)
Total CHOL/HDL Ratio: 5.2 (calc) — ABNORMAL HIGH (ref <5.0)
Triglycerides: 112 mg/dL (ref <150)

## 2019-01-30 LAB — MICROALBUMIN / CREATININE URINE RATIO
CREATININE, URINE: 134 mg/dL (ref 20–275)
Microalb Creat Ratio: 30 mcg/mg creat — ABNORMAL HIGH (ref <30)
Microalb, Ur: 4 mg/dL

## 2019-01-30 LAB — VITAMIN D 25 HYDROXY (VIT D DEFICIENCY, FRACTURES): VIT D 25 HYDROXY: 22 ng/mL — AB (ref 30–100)

## 2019-02-03 ENCOUNTER — Ambulatory Visit: Payer: 59 | Admitting: Family Medicine

## 2019-02-04 ENCOUNTER — Encounter: Payer: Self-pay | Admitting: Family Medicine

## 2019-02-11 ENCOUNTER — Telehealth: Payer: Self-pay | Admitting: Family Medicine

## 2019-02-11 NOTE — Telephone Encounter (Signed)
Copied from [place] [id number]. Topic: General - Other >> Feb 11, 2019 10:54 AM Valla Leaver wrote: Reason for CRM: Joycelyn Schmid with [employer] calling to get short term disability info regarding restrictions t was placed on. OFB#[id number]

## 2019-02-12 ENCOUNTER — Other Ambulatory Visit: Payer: Self-pay | Admitting: Family Medicine

## 2019-02-12 DIAGNOSIS — R809 Proteinuria, unspecified: Principal | ICD-10-CM

## 2019-02-12 DIAGNOSIS — E1169 Type 2 diabetes mellitus with other specified complication: Secondary | ICD-10-CM

## 2019-02-12 DIAGNOSIS — Z794 Long term (current) use of insulin: Principal | ICD-10-CM

## 2019-02-12 DIAGNOSIS — E785 Hyperlipidemia, unspecified: Secondary | ICD-10-CM

## 2019-02-12 DIAGNOSIS — E1129 Type 2 diabetes mellitus with other diabetic kidney complication: Secondary | ICD-10-CM

## 2019-02-12 NOTE — Telephone Encounter (Signed)
Filled in dates and re-faxed.

## 2019-02-13 NOTE — Telephone Encounter (Signed)
Put paperwork on your desk for amendment.

## 2019-02-13 NOTE — Telephone Encounter (Signed)
Lattie Haw with [employer] calling back because they are needing "restrictions and limitations" for those dates, 2/16-2/23. Dates are ok, just need this other information.   She is requesting a call back instead of refaxing with this information  [phone] Claim number  [id number]

## 2019-02-13 NOTE — Telephone Encounter (Signed)
Refill request for diabetic medication:   Freestyle Libre  14 day sensor  Last office visit pertaining to diabetes: 01/29/2019  Lab Results  Component Value Date   HGBA1C 10.0 (A) 01/29/2019    Follow-ups on file. 05/13/2019

## 2019-02-13 NOTE — Telephone Encounter (Signed)
Put the dates in could you please just list the restrictions.

## 2019-05-13 ENCOUNTER — Other Ambulatory Visit: Payer: Self-pay

## 2019-05-13 ENCOUNTER — Ambulatory Visit (INDEPENDENT_AMBULATORY_CARE_PROVIDER_SITE_OTHER): Payer: 59 | Admitting: Family Medicine

## 2019-05-13 ENCOUNTER — Encounter: Payer: Self-pay | Admitting: Family Medicine

## 2019-05-13 VITALS — Wt 316.0 lb

## 2019-05-13 DIAGNOSIS — E559 Vitamin D deficiency, unspecified: Secondary | ICD-10-CM

## 2019-05-13 DIAGNOSIS — G40409 Other generalized epilepsy and epileptic syndromes, not intractable, without status epilepticus: Secondary | ICD-10-CM

## 2019-05-13 DIAGNOSIS — E785 Hyperlipidemia, unspecified: Secondary | ICD-10-CM

## 2019-05-13 DIAGNOSIS — E1169 Type 2 diabetes mellitus with other specified complication: Secondary | ICD-10-CM

## 2019-05-13 DIAGNOSIS — K7581 Nonalcoholic steatohepatitis (NASH): Secondary | ICD-10-CM

## 2019-05-13 DIAGNOSIS — E1129 Type 2 diabetes mellitus with other diabetic kidney complication: Secondary | ICD-10-CM

## 2019-05-13 DIAGNOSIS — R221 Localized swelling, mass and lump, neck: Secondary | ICD-10-CM

## 2019-05-13 DIAGNOSIS — D72829 Elevated white blood cell count, unspecified: Secondary | ICD-10-CM

## 2019-05-13 DIAGNOSIS — Z794 Long term (current) use of insulin: Secondary | ICD-10-CM

## 2019-05-13 DIAGNOSIS — R809 Proteinuria, unspecified: Secondary | ICD-10-CM

## 2019-05-13 NOTE — Progress Notes (Signed)
Name: Erin Good   MRN: [id number]    DOB: [birth year]   Date:05/13/2019       Progress Note  Subjective  Chief Complaint  Chief Complaint  Patient presents with   Diabetes   Dyslipidemia   Obesity   Depression   Anxiety    I connected with  Brandee Markin Gant-Pegues  on 05/13/19 at  8:40 AM EDT by a video enabled telemedicine application and verified that I am speaking with the correct person using two identifiers.  I discussed the limitations of evaluation and management by telemedicine and the availability of in person appointments. The patient expressed understanding and agreed to proceed. Staff also discussed with the patient that there may be a patient responsible charge related to this service. Patient Location: at home  Provider Location: [facility]   HPI  DMII: she was seen in Feb 2018 with Bell's palsy and was not using any of her medications at the time. hgbA1C 9.9 10.3 , 10.9%, 10.4%9.4%,9.8%, 10.4%, and it was 10 % on her last visit   , explained long term risk of uncontrolled DM. She was  afraid of insulin because her mother died from hypoglycemia, but since last visit she has been using [place] daily , currently 44 units, advised to go up to 50 units. She tried [employer] but did not register. She states fasting around 180, post-prandial in the 250's. She is willing to see Endocrinologist.  She states she eats very healthy, cooking at home, she still eats buns with her burgers - discussed changing to lettuce wrap, switching from sodas to sparkling water.   Seizure disorder: she sees Dr. Lysle Rubens, had one seizure Summer 2017, she is doing well since. Taking medication as prescribed. She is on higher dose of medication since episode,she finished rx vitamin D and is currently taking otc supplementation, last Vitamin D level improved, still taking supplementation   Obesity: weight is stable, she states she eats smaller portions, she is  trying to avoid carbohydrates. Drinking more water, Diet sodas or sparking water. She has gained 6 lbs since last visit.   Dyslipidemia: she does not like taking medication. No chest pain or palpitation . It was a virtual visit and hopefully endo will check it during her next visit   Leucocytosis: we will recheck when she comes in next time.  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   Gastro-esophageal reflux disease without esophagitis 40/98/1191   Dysmetabolic syndrome 47/82/9562   Extreme obesity 04/20/2015   NASH (nonalcoholic steatohepatitis) 04/20/2015   Allergy to nuts 04/20/2015   Calculus of kidney 04/20/2015   Type 2 diabetes mellitus with renal manifestations ([place]) 04/20/2015   Central sleep apnea 11/26/2008   Dyslipidemia 07/02/2008   Leukocytosis 07/29/2007    Past Surgical History:  Procedure Laterality Date   CESAREAN SECTION     X 2   RIGHT OOPHORECTOMY Right 2001   benign tumor   TUBAL LIGATION  2007    Family History  Problem Relation Age of Onset   Diabetes Mother    Breast cancer Paternal Grandmother 79   Cancer Paternal Grandmother    Cancer Paternal Aunt    Cancer Maternal Grandmother    Heart disease Neg Hx     Social History   Socioeconomic History   Marital status: Married    Spouse name: Roderic Palau   Number of children: 2   Years of education: College   Highest education level: Not on file  Occupational History     Occupation: Scientist, research (life sciences) strain: Not hard at all   Food insecurity    Worry: Never true    Inability: Never true   Transportation needs    Medical: No    Non-medical: No  Tobacco Use   Smoking status: Never Smoker   Smokeless tobacco: Never Used  Substance and Sexual Activity   Alcohol use: No   Drug use: No   Sexual activity: Not on file  Lifestyle   Physical activity    Days per week: 0 days    Minutes per session: 0 min   Stress: Only a little  Relationships   Social connections    Talks on phone: More than three times a week    Gets together: More than three times a week    Attends religious service: More than 4 times per year    Active member of club or organization: Yes    Attends meetings of clubs or organizations: More than 4 times per year    Relationship status: Married   Intimate partner violence    Fear of current or ex partner: No    Emotionally abused: No    Physically abused: No    Forced sexual activity: No  Other Topics Concern   Not on file  Social History Narrative   Not on file     Current Outpatient Medications:    cholecalciferol (VITAMIN D) 1000 units tablet, Take 1 tablet (1,000 Units total) by mouth daily., Disp: 30 tablet, Rfl: 0   clonazePAM (KLONOPIN) 0.5 MG tablet, Take 0.5 mg by mouth only at the onset of a seizure, Disp: , Rfl: 3   Continuous Blood Gluc Sensor (FREESTYLE LIBRE 14 DAY SENSOR) MISC, USE 1 EVERY 14 DAYS AS DIRECTED, Disp: 2 each, Rfl: 5   EPINEPHrine (EPIPEN 2-PAK) 0.3 mg/0.3 mL IJ SOAJ injection, Inject 0.3 mg into the muscle once as needed (for anaphylaxis). , Disp: , Rfl:    fluticasone (FLONASE) 50 MCG/ACT nasal spray, Place 2 sprays into both nostrils daily as needed., Disp: , Rfl: 12   Insulin Glargine-Lixisenatide (SOLIQUA) 100-33 UNT-MCG/ML SOPN, Inject 50 Units into the skin daily., Disp: 15 pen, Rfl: 0   Insulin Pen Needle (NOVOFINE) 32G X 6 MM MISC, 1 each  by Does not apply route daily., Disp: 100 each, Rfl: 0   metFORMIN (GLUCOPHAGE XR) 750 MG 24 hr tablet, Take 2 tablets (1,500 mg total) by mouth daily., Disp: 180 tablet, Rfl: 0   montelukast (SINGULAIR) 10 MG tablet, Take 1 tablet (10 mg total) by mouth at bedtime., Disp: 30 tablet, Rfl: 2   OVER THE COUNTER MEDICATION, Take 1 packet by mouth daily. The Nature Made Diabetes Health Pack (Multivitamin, Fish Oil, Vitamin D3, Magnesium,Vitamin C, Alpha Lipoic Acid, Chromium), Disp: , Rfl:    zonisamide (ZONEGRAN) 100 MG capsule, Take 400 mg by mouth at bedtime. , Disp: , Rfl:    fluconazole (  DIFLUCAN) 150 MG tablet, Take 1 tablet (150 mg total) by mouth every other day. (Patient not taking: Reported on 05/13/2019), Disp: 3 tablet, Rfl: 0  Allergies  Allergen Reactions   Peanuts [Peanut Oil] Anaphylaxis   Aspirin Other (See Comments)    Does take because of her epilepsy/seizure     I personally reviewed active problem list, medication list, allergies, family history, social history with the patient/caregiver today.   ROS  Ten systems reviewed and is negative except as mentioned in HPI   Objective  Virtual encounter, vitals not obtained, her weigh at home was 316 lbs   There is no height or weight on file to calculate BMI.  Physical Exam  Awake, alert and oriented  PHQ2/9: Depression screen [facility] 2/9 05/13/2019 09/17/2018 04/01/2018 01/31/2017 12/19/2016  Decreased Interest 0 0 1 0 0  Down, Depressed, Hopeless 0 0 0 0 0  PHQ - 2 Score 0 0 1 0 0  Altered sleeping 0 0 0 - -  Tired, decreased energy 1 0 2 - -  Change in appetite 0 0 1 - -  Feeling bad or failure about yourself  0 0 0 - -  Trouble concentrating 0 0 0 - -  Moving slowly or fidgety/restless 0 0 1 - -  Suicidal thoughts 0 0 0 - -  PHQ-9 Score 1 0 5 - -  Difficult doing work/chores Not difficult at all Not difficult at all Not difficult at all - -   PHQ-2/9 Result is negative.    Fall Risk: Fall Risk  05/13/2019  01/29/2019 09/17/2018 04/01/2018 12/18/2017  Falls in the past year? 0 0 0 No No  Number falls in past yr: 0 0 - - -  Injury with Fall? 0 0 - - -  Comment - - - - -     Assessment & Plan  1. Type 2 diabetes mellitus with microalbuminuria, with long-term current use of insulin ([place])  - Ambulatory referral to Endocrinology  2. Dyslipidemia associated with type 2 diabetes mellitus ([place])  - Ambulatory referral to Endocrinology  3. Morbid obesity ([place])  Discussed with the patient the risk posed by an increased BMI. Discussed importance of portion control, calorie counting and at least 150 minutes of physical activity weekly. Avoid sweet beverages and drink more water. Eat at least 6 servings of fruit and vegetables daily   4. Dyslipidemia  Does not want statins at this time  5. 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 Vitamin D deficiency  Continue supplementation   7. NASH (nonalcoholic steatohepatitis)  Explained importance of weight loss and DM control   8. Lump in neck  She will see endo, but explained that if not checked or if Endo thinks not her thyroid she will need to come in for evaluation in our office, and get [place] or referral for biopsy   9. Leukocytosis, unspecified type  Explained that we will need to recheck labs.   I discussed the assessment and treatment plan with the patient. The patient was provided an opportunity to ask questions and all were answered. The patient agreed with the plan and demonstrated an understanding of the instructions.  The patient was advised to call back or seek an in-person evaluation if the symptoms worsen or if the condition fails to improve as anticipated.  I provided 25  minutes of non-face-to-face time during this encounter.

## 2019-05-18 ENCOUNTER — Other Ambulatory Visit: Payer: Self-pay | Admitting: Family Medicine

## 2019-05-18 ENCOUNTER — Encounter: Payer: Self-pay | Admitting: Family Medicine

## 2019-05-18 DIAGNOSIS — R928 Other abnormal and inconclusive findings on diagnostic imaging of breast: Secondary | ICD-10-CM

## 2019-05-20 ENCOUNTER — Telehealth: Payer: Self-pay | Admitting: Family Medicine

## 2019-05-20 NOTE — Telephone Encounter (Signed)
General/Other - appointment The patient called to make an in-person appointment with Dr. Ancil Boozer but we were placed on hold for over 10 minutes. Please make this patient an appointment so that Dr. Ancil Boozer can look at the knots on her head.

## 2019-05-20 NOTE — Telephone Encounter (Signed)
appt scheduled for Monday 7.20.20

## 2019-05-25 ENCOUNTER — Other Ambulatory Visit: Payer: Self-pay

## 2019-05-25 ENCOUNTER — Encounter: Payer: Self-pay | Admitting: Family Medicine

## 2019-05-25 ENCOUNTER — Ambulatory Visit (INDEPENDENT_AMBULATORY_CARE_PROVIDER_SITE_OTHER): Payer: 59 | Admitting: Family Medicine

## 2019-05-25 VITALS — BP 132/84 | HR 89 | Temp 96.8°F | Resp 16 | Ht 66.0 in | Wt 321.1 lb

## 2019-05-25 DIAGNOSIS — E1169 Type 2 diabetes mellitus with other specified complication: Secondary | ICD-10-CM

## 2019-05-25 DIAGNOSIS — R221 Localized swelling, mass and lump, neck: Secondary | ICD-10-CM

## 2019-05-25 DIAGNOSIS — E1129 Type 2 diabetes mellitus with other diabetic kidney complication: Secondary | ICD-10-CM | POA: Diagnosis not present

## 2019-05-25 DIAGNOSIS — R809 Proteinuria, unspecified: Secondary | ICD-10-CM

## 2019-05-25 DIAGNOSIS — B373 Candidiasis of vulva and vagina: Secondary | ICD-10-CM | POA: Diagnosis not present

## 2019-05-25 DIAGNOSIS — Z794 Long term (current) use of insulin: Secondary | ICD-10-CM

## 2019-05-25 DIAGNOSIS — E010 Iodine-deficiency related diffuse (endemic) goiter: Secondary | ICD-10-CM

## 2019-05-25 DIAGNOSIS — B3731 Acute candidiasis of vulva and vagina: Secondary | ICD-10-CM

## 2019-05-25 DIAGNOSIS — E785 Hyperlipidemia, unspecified: Secondary | ICD-10-CM

## 2019-05-25 MED ORDER — FLUCONAZOLE 150 MG PO TABS: 150.0000 mg | ORAL_TABLET | ORAL | 0 refills | Status: DC

## 2019-05-25 MED ORDER — METFORMIN HCL ER 750 MG PO TB24: 1500.0000 mg | ORAL_TABLET | Freq: Every day | ORAL | 0 refills | Status: DC

## 2019-05-25 MED ORDER — SOLIQUA 100-33 UNT-MCG/ML ~~LOC~~ SOPN: 50.0000 [IU] | PEN_INJECTOR | Freq: Every day | SUBCUTANEOUS | 0 refills | Status: DC

## 2019-05-25 NOTE — Progress Notes (Signed)
Name: Erin Good   MRN: [id number]    DOB: 1976/01/27   Date:05/25/2019       Progress Note  Subjective  Chief Complaint  Chief Complaint  Patient presents with  . Cyst    Onset-1 month, right side of her neck is swollen-denies any pain just states it is getting bigger    HPI  Anterior neck mass: she noticed a mass on right anterior neck about two months ago, she thinks it is getting larger, no pain or redness, denies dysphagia, hair loss or dry skin. No change in bowel movements. She has gained weight 11 lbs in the past 4 months but has been cooking at home and not sure why she is gaining weight  DMII: sensor for Free Style did not work for her, she states fsbs in am's has been higher in the 200's, she states not taking metformin because it was not at the pharmacy ( back ordered) advised to pick it up today .She has been drinking wine coolers, still has bread , explained the need to eliminate breads, pasta, pizza and rice.   Recurrent yeast infection: needs refill of Diflucan    Patient Active Problem List   Diagnosis Date Noted  . Vitamin D deficiency 01/31/2017  . Right shoulder tendinitis 01/31/2017  . Tendinosis 01/31/2017  . Bell's palsy 12/19/2016  . Tachycardia 09/30/2016  . Depression with anxiety 05/06/2015  . Allergic rhinitis 04/20/2015  . Anxiety and depression 04/20/2015  . 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 Gastro-esophageal reflux disease without esophagitis 04/20/2015  . Dysmetabolic syndrome 81/82/9937  . Extreme obesity 04/20/2015  . NASH (nonalcoholic steatohepatitis) 04/20/2015  . Allergy to nuts 04/20/2015  . Calculus of kidney 04/20/2015  . Type 2 diabetes mellitus with renal manifestations ([place]) 04/20/2015  . Central sleep apnea 11/26/2008  . Dyslipidemia 07/02/2008  . Leukocytosis 07/29/2007    Past Surgical History:  Procedure Laterality Date  . CESAREAN SECTION     X 2  . RIGHT OOPHORECTOMY Right 2001   benign tumor   . TUBAL LIGATION  2007    Family History  Problem Relation Age of Onset  . Diabetes Mother   . Breast cancer Paternal Grandmother 64  . Cancer Paternal Grandmother   . Cancer Paternal Aunt   . Cancer Maternal Grandmother   . Heart disease Neg Hx     Social History   Socioeconomic History  . Marital status: Married    Spouse name: Roderic Palau  . Number of children: 2  . Years of education: College  . Highest education level: Not on file  Occupational History  . Occupation: Optometrist  Social Needs  . Financial resource strain: Not hard at all  . Food insecurity    Worry: Never true    Inability: Never true  . Transportation needs    Medical: No    Non-medical: No  Tobacco Use  . Smoking status: Never Smoker  . Smokeless tobacco: Never Used  Substance and Sexual Activity  . Alcohol use: No  . Drug use: No  . Sexual activity: Not on file  Lifestyle  . Physical activity    Days per week: 0 days    Minutes per session: 0 min  . Stress: Only a little  Relationships  . Social connections    Talks on phone: More than three times a week    Gets together: More than three times a week    Attends religious service: More than 4 times per  year    Active member of club or organization: Yes    Attends meetings of clubs or organizations: More than 4 times per year    Relationship status: Married  . Intimate partner violence    Fear of current or ex partner: No    Emotionally abused: No    Physically abused: No    Forced sexual activity: No  Other Topics Concern  . Not on file  Social History Narrative  . Not on file     Current Outpatient Medications:  .  cholecalciferol (VITAMIN D) 1000 units tablet, Take 1 tablet (1,000 Units total) by mouth daily., Disp: 30 tablet, Rfl: 0 .  clonazePAM (KLONOPIN) 0.5 MG tablet, Take 0.5 mg by mouth only at the onset of a seizure, Disp: , Rfl: 3 .  EPINEPHrine (EPIPEN 2-PAK) 0.3 mg/0.3 mL IJ SOAJ injection, Inject 0.3 mg into the  muscle once as needed (for anaphylaxis). , Disp: , Rfl:  .  fluticasone (FLONASE) 50 MCG/ACT nasal spray, Place 2 sprays into both nostrils daily as needed., Disp: , Rfl: 12 .  Insulin Pen Needle (NOVOFINE) 32G X 6 MM MISC, 1 each by Does not apply route daily., Disp: 100 each, Rfl: 0 .  montelukast (SINGULAIR) 10 MG tablet, Take 1 tablet (10 mg total) by mouth at bedtime., Disp: 30 tablet, Rfl: 2 .  OVER THE COUNTER MEDICATION, Take 1 packet by mouth daily. The Nature Made Diabetes Health Pack (Multivitamin, Fish Oil, Vitamin D3, Magnesium,Vitamin C, Alpha Lipoic Acid, Chromium), Disp: , Rfl:  .  zonisamide (ZONEGRAN) 100 MG capsule, Take 400 mg by mouth at bedtime. , Disp: , Rfl:  .  fluconazole (DIFLUCAN) 150 MG tablet, Take 1 tablet (150 mg total) by mouth every other day., Disp: 3 tablet, Rfl: 0 .  Insulin Glargine-Lixisenatide (SOLIQUA) 100-33 UNT-MCG/ML SOPN, Inject 50 Units into the skin daily., Disp: 15 pen, Rfl: 0 .  metFORMIN (GLUCOPHAGE XR) 750 MG 24 hr tablet, Take 2 tablets (1,500 mg total) by mouth daily., Disp: 180 tablet, Rfl: 0  Allergies  Allergen Reactions  . Peanuts [Peanut Oil] Anaphylaxis  . Aspirin Other (See Comments)    Does take because of her epilepsy/seizure     I personally reviewed active problem list, medication list, allergies, family history, social history with the patient/caregiver today.   ROS  Constitutional: Negative for fever, positive for  weight change.  Respiratory: Negative for cough and shortness of breath.   Cardiovascular: Negative for chest pain or palpitations.  Gastrointestinal: Negative for abdominal pain, no bowel changes.  Musculoskeletal: Negative for gait problem or joint swelling.  Skin: Negative for rash.  Neurological: Negative for dizziness or headache.  No other specific complaints in a complete review of systems (except as listed in HPI above).  Objective  Vitals:   05/25/19 0919  BP: 132/84  Pulse: 89  Resp: 16  Temp:  (!) 96.8 F (36 C)  TempSrc: Temporal  SpO2: 98%  Weight: (!) 321 lb 1.6 oz (145.7 kg)  Height: 5' 6"  (1.676 m)    Body mass index is 51.83 kg/m.  Physical Exam  Constitutional: Patient appears well-developed and well-nourished. Obese  No distress.  HEENT: head atraumatic, normocephalic, pupils equal and reactive to light,neck supple, she has a lump on right side of neck, likely thyromegaly Cardiovascular: Normal rate, regular rhythm and normal heart sounds.  No murmur heard. No BLE edema. Pulmonary/Chest: Effort normal and breath sounds normal. No respiratory distress. Abdominal: Soft.  There is no  tenderness. Psychiatric: Patient has a normal mood and affect. behavior is normal. Judgment and thought content normal.   PHQ2/9: Depression screen [facility] 2/9 05/25/2019 05/13/2019 09/17/2018 04/01/2018 01/31/2017  Decreased Interest 0 0 0 1 0  Down, Depressed, Hopeless 0 0 0 0 0  PHQ - 2 Score 0 0 0 1 0  Altered sleeping 0 0 0 0 -  Tired, decreased energy 2 1 0 2 -  Change in appetite 0 0 0 1 -  Feeling bad or failure about yourself  0 0 0 0 -  Trouble concentrating 0 0 0 0 -  Moving slowly or fidgety/restless 0 0 0 1 -  Suicidal thoughts 0 0 0 0 -  PHQ-9 Score 2 1 0 5 -  Difficult doing work/chores Not difficult at all Not difficult at all Not difficult at all Not difficult at all -    phq 9 is negative   Fall Risk: Fall Risk  05/25/2019 05/13/2019 01/29/2019 09/17/2018 04/01/2018  Falls in the past year? 0 0 0 0 No  Number falls in past yr: 0 0 0 - -  Injury with Fall? 0 0 0 - -  Comment - - - - -     Functional Status Survey: Is the patient deaf or have difficulty hearing?: No Does the patient have difficulty seeing, even when wearing glasses/contacts?: Yes Does the patient have difficulty concentrating, remembering, or making decisions?: No Does the patient have difficulty walking or climbing stairs?: No Does the patient have difficulty dressing or bathing?: No Does the  patient have difficulty doing errands alone such as visiting a doctor's office or shopping?: No    Assessment & Plan  1. Type 2 diabetes mellitus with microalbuminuria, with long-term current use of insulin (HCC)  - metFORMIN (GLUCOPHAGE XR) 750 MG 24 hr tablet; Take 2 tablets (1,500 mg total) by mouth daily.  Dispense: 180 tablet; Refill: 0 - Insulin Glargine-Lixisenatide (SOLIQUA) 100-33 UNT-MCG/ML SOPN; Inject 50 Units into the skin daily.  Dispense: 15 pen; Refill: 0 - Hemoglobin A1c  2. Yeast vaginitis  - fluconazole (DIFLUCAN) 150 MG tablet; Take 1 tablet (150 mg total) by mouth every other day.  Dispense: 3 tablet; Refill: 0  3. Dyslipidemia associated with type 2 diabetes mellitus (HCC)  - metFORMIN (GLUCOPHAGE XR) 750 MG 24 hr tablet; Take 2 tablets (1,500 mg total) by mouth daily.  Dispense: 180 tablet; Refill: 0  4. Neck mass  - US THYROID; Future - Thyroid Panel With TSH  5. Thyromegaly  - Thyroid Panel With TSH

## 2019-05-26 ENCOUNTER — Other Ambulatory Visit: Payer: Self-pay | Admitting: Family Medicine

## 2019-05-26 DIAGNOSIS — Z794 Long term (current) use of insulin: Secondary | ICD-10-CM

## 2019-05-26 DIAGNOSIS — E1129 Type 2 diabetes mellitus with other diabetic kidney complication: Secondary | ICD-10-CM

## 2019-05-26 LAB — THYROID PANEL WITH TSH
Free Thyroxine Index: 2.6 (ref 1.4–3.8)
T3 Uptake: 29 % (ref 22–35)
T4, Total: 9 ug/dL (ref 5.1–11.9)
TSH: 1.35 mIU/L

## 2019-05-26 LAB — HEMOGLOBIN A1C
Hgb A1c MFr Bld: 10.9 % of total Hgb — ABNORMAL HIGH (ref <5.7)
Mean Plasma Glucose: 266 (calc)
eAG (mmol/L): 14.7 (calc)

## 2019-06-01 ENCOUNTER — Other Ambulatory Visit: Payer: Self-pay

## 2019-06-01 ENCOUNTER — Ambulatory Visit
Admission: RE | Admit: 2019-06-01 | Discharge: 2019-06-01 | Disposition: A | Discharge status: Home / Self Care | Payer: 59 | Source: Ambulatory Visit | Attending: Family Medicine | Admitting: Family Medicine

## 2019-06-01 DIAGNOSIS — R221 Localized swelling, mass and lump, neck: Secondary | ICD-10-CM | POA: Insufficient documentation

## 2019-06-02 ENCOUNTER — Ambulatory Visit
Admission: RE | Admit: 2019-06-02 | Discharge: 2019-06-02 | Disposition: A | Discharge status: Home / Self Care | Payer: 59 | Source: Ambulatory Visit | Attending: Family Medicine | Admitting: Family Medicine

## 2019-06-02 DIAGNOSIS — R928 Other abnormal and inconclusive findings on diagnostic imaging of breast: Secondary | ICD-10-CM | POA: Diagnosis present

## 2019-06-03 ENCOUNTER — Telehealth: Payer: Self-pay

## 2019-06-03 NOTE — Telephone Encounter (Signed)
Copied from [place] [id number]. Topic: General - Other >> Jun 03, 2019 11:26 AM Celene Kras A wrote: Reason for CRM: Pt called and states she was told by PCP that she needed to schedule a biopsy. Pt states she is not sure if she should wait until after her appt with the endocrinologist, schedule the biopsy with PCP, or get in contact with Endocrinology about being seen sooner so she can get the biopsy scheduled sooner. Please advise.

## 2019-06-30 ENCOUNTER — Other Ambulatory Visit: Payer: Self-pay

## 2019-07-03 ENCOUNTER — Encounter: Payer: Self-pay | Admitting: Internal Medicine

## 2019-07-03 ENCOUNTER — Other Ambulatory Visit: Payer: Self-pay

## 2019-07-03 ENCOUNTER — Ambulatory Visit (INDEPENDENT_AMBULATORY_CARE_PROVIDER_SITE_OTHER): Payer: 59 | Admitting: Internal Medicine

## 2019-07-03 VITALS — BP 138/88 | HR 97 | Ht 66.0 in | Wt 318.0 lb

## 2019-07-03 DIAGNOSIS — E1169 Type 2 diabetes mellitus with other specified complication: Secondary | ICD-10-CM | POA: Diagnosis not present

## 2019-07-03 DIAGNOSIS — R809 Proteinuria, unspecified: Secondary | ICD-10-CM

## 2019-07-03 DIAGNOSIS — Z794 Long term (current) use of insulin: Secondary | ICD-10-CM

## 2019-07-03 DIAGNOSIS — E042 Nontoxic multinodular goiter: Secondary | ICD-10-CM

## 2019-07-03 DIAGNOSIS — E785 Hyperlipidemia, unspecified: Secondary | ICD-10-CM

## 2019-07-03 DIAGNOSIS — E1129 Type 2 diabetes mellitus with other diabetic kidney complication: Secondary | ICD-10-CM

## 2019-07-03 MED ORDER — ONETOUCH DELICA LANCETS 33G MISC: 3 refills | Status: DC

## 2019-07-03 MED ORDER — INSULIN PEN NEEDLE 32G X 4 MM MISC: 3 refills | Status: AC

## 2019-07-03 MED ORDER — LANTUS SOLOSTAR 100 UNIT/ML ~~LOC~~ SOPN: 50.0000 [IU] | PEN_INJECTOR | Freq: Every day | SUBCUTANEOUS | 5 refills | Status: DC

## 2019-07-03 MED ORDER — OZEMPIC (1 MG/DOSE) 2 MG/1.5ML ~~LOC~~ SOPN: 1.0000 mg | PEN_INJECTOR | SUBCUTANEOUS | 5 refills | Status: DC

## 2019-07-03 MED ORDER — METFORMIN HCL ER 750 MG PO TB24: 1500.0000 mg | ORAL_TABLET | Freq: Every day | ORAL | 3 refills | Status: DC

## 2019-07-03 MED ORDER — ONETOUCH VERIO VI STRP: ORAL_STRIP | 3 refills | Status: DC

## 2019-07-03 NOTE — Progress Notes (Addendum)
Patient ID: [name], female   DOB: 01-May-1976, 43 y.o.   MRN: [id number]   HPI: Erin Good is a 43 y.o.-year-old female, referred by her PCP, Dr. Ancil Boozer, for management of DM2, dx in ~2015, insulin-dependent since 2019, uncontrolled, with microalbuminuria.  Reviewed latest HbA1c level: Lab Results  Component Value Date   HGBA1C 10.9 (H) 05/25/2019   HGBA1C 10.0 (A) 01/29/2019   HGBA1C 10.4 (A) 04/01/2018   HGBA1C 9.8 12/18/2017   HGBA1C 9.4 09/17/2017   HGBA1C 10.9 06/13/2017   HGBA1C 10.3 (H) 01/31/2017   HGBA1C 9.9 10/03/2016   HGBA1C 9.0 (A) 09/14/2014   Pt is on a regimen of: - Metformin ER 750 mg 2x a day, with meals - Soliqua (Lantus + Lixisenatide) 50 units daily in am  Pt checks her sugars 1-2x a day and they are: - am: 220-230 - 2h after b'fast: n/c - before lunch: n/c - 2h after lunch: 170-180 - before dinner: n/c - 2h after dinner: n/c - bedtime: n/c - nighttime: n/c Lowest sugar was 96 (after a meal); + hypoglycemia awareness at 100. Highest sugar was 300.  Glucometer: [employer] advance  Pt's meals are: - Breakfast: 2 eggs + 3 pieces of bacon - snack: fruit - Lunch: salad - Dinner:meat + veggie + starch (rice + pasta) - Snacks: 1  Reducing sodas: Coke >> sparkling water  - no CKD, last BUN/creatinine:  Lab Results  Component Value Date   BUN 13 01/29/2019   BUN 5 (L) 09/10/2018   CREATININE 0.66 01/29/2019   CREATININE 0.57 09/10/2018  She is not on an ACE inhibitor/ARB.  Last ACR was slightly high: Lab Results  Component Value Date   MICRALBCREAT 30 (H) 01/29/2019   - + HL; last set of lipids: Lab Results  Component Value Date   CHOL 219 (H) 01/29/2019   HDL 42 (L) 01/29/2019   LDLCALC 154 (H) 01/29/2019   TRIG 112 01/29/2019   CHOLHDL 5.2 (H) 01/29/2019  She is not on a statin.  - last eye exam was in 10/2018. No DR.   - no numbness and tingling in her feet.  Pt has FH of DM in mother, MGF.  She also has a  history of thyroid nodules:  Reviewed thyroid ultrasound report and images from 06/01/2019:  Parenchymal Echotexture: Mildly heterogenous Isthmus: 0.4 cm thickness Right lobe: 5 x 2.4 x 2.8 cm Left lobe: 4.5 x 2 x 2.1 cm  Estimated total number of nodules >/= 1 cm: 4  Nodule # 1: Location: Right; Mid Maximum size: 1.6 cm; Other 2 dimensions: 1.5 x 1.4 cm Composition: mixed cystic and solid (1) Echogenicity: hypoechoic (2) *Given size (>/= 1.5 - 2.4 cm) and appearance, a follow-up ultrasound in 1 year should be considered based on TI-RADS criteria. _________________________________________________________  Nodule # 2: Location: Right; Inferior Maximum size: 2.3 cm; Other 2 dimensions: 1.9 x 1.9 cm Composition: solid/almost completely solid (2) Echogenicity: hypoechoic (2) **Given size (>/= 1.5 cm) and appearance, fine needle aspiration of this moderately suspicious nodule should be considered based on TI-RADS criteria. _________________________________________________  Nodule # 3: Location: Left; Inferior Maximum size: 1.4 cm; Other 2 dimensions: 1.2 x 1.2 cm Composition: solid/almost completely solid (2) Echogenicity: isoechoic (1) Given size (<1.4 cm) and appearance, this nodule does NOT meet TI-RADS criteria for biopsy or dedicated follow-up. _________________________________________________________  Nodule # 4: Location: Left; Mid Maximum size: 1.8 cm; Other 2 dimensions: 1.6 x 1.3 cm Composition: solid/almost completely solid (2) Echogenicity: hypoechoic (2) **  Given size (>/= 1.5 cm) and appearance, fine needle aspiration of this moderately suspicious nodule should be considered based on TI-RADS criteria.  IMPRESSION: 1. Thyromegaly with bilateral nodules. 2. Recommend FNA biopsy of moderately suspicious 1.8 cm mid left AND 2.3 cm inferior right nodules. 3. Recommend annual/biennial ultrasound follow-up of additional nodules as above, until stability x5  years confirmed.  Her thyroid tests have been normal: Lab Results  Component Value Date   TSH 1.35 05/25/2019   TSH 1.070 07/28/2015   TSH 1.13 10/15/2014   Pt denies: - feeling nodules in neck - hoarseness - choking - SOB with lying down But + dysphagia - when drinking with a straw.   She also has a history of PCOS and distant history of epilepsy at 43 years old.  ROS: Constitutional: + Weight gain, no weight loss, + fatigue, no subjective hyperthermia, no subjective hypothermia, no nocturia Eyes: no blurry vision, no xerophthalmia ENT: no sore throat, + see HPI, no tinnitus, no hypoacusis Cardiovascular: no CP, no SOB, no palpitations, no leg swelling Respiratory: no cough, no SOB, no wheezing Gastrointestinal: no N, no V, no D, no C, no acid reflux Musculoskeletal: no muscle, no joint aches Skin: no rash, no hair loss Neurological: no tremors, no numbness or tingling/no dizziness/no HAs Psychiatric: no depression, no anxiety  Past Medical History:  Diagnosis Date  . Allergic rhinitis   . Breast discharge 06/05/2017   2 weeks ago left  . Breast mass 12/06/2016   left  . Diabetes mellitus without complication ([place])   . Dyslipidemia   . Epilepsy ([place])   . Epilepsy ([place])   . Febrile seizures ([place])   . Galactorrhea   . Hx gestational diabetes   . Kidney stones   . Obesity   . Seizures ([place])   . Sleep apnea   . Syncope and collapse   . Tachycardia    Past Surgical History:  Procedure Laterality Date  . BREAST BIOPSY Left 2018   benign  . CESAREAN SECTION     X 2  . RIGHT OOPHORECTOMY Right 2001   benign tumor  . TUBAL LIGATION  2007   Social History   Socioeconomic History  . Marital status: Married    Spouse name: Roderic Palau  . Number of children: 2  . Years of education: College  . Highest education level: Not on file  Occupational History  . Occupation: Optometrist  Social Needs  . Financial resource strain: Not hard at all  . Food insecurity     Worry: Never true    Inability: Never true  . Transportation needs    Medical: No    Non-medical: No  Tobacco Use  . Smoking status: Never Smoker  . Smokeless tobacco: Never Used  Substance and Sexual Activity  . Alcohol use: No  . Drug use: No  . Sexual activity: Not on file  Lifestyle  . Physical activity    Days per week: 0 days    Minutes per session: 0 min  . Stress: Only a little  Relationships  . Social connections    Talks on phone: More than three times a week    Gets together: More than three times a week    Attends religious service: More than 4 times per year    Active member of club or organization: Yes    Attends meetings of clubs or organizations: More than 4 times per year    Relationship status: Married  . Intimate partner violence  Fear of current or ex partner: No    Emotionally abused: No    Physically abused: No    Forced sexual activity: No  Other Topics Concern  . Not on file  Social History Narrative  . Not on file   Meds: - Metformin ER 750 mg 2x a day, with meals - Soliqua (Lantus + Lixisenatide) 50 units dialy in am + at Current Outpatient Medications on File Prior to Visit  Medication Sig Dispense Refill  . cholecalciferol (VITAMIN D) 1000 units tablet Take 1 tablet (1,000 Units total) by mouth daily. 30 tablet 0  . clonazePAM (KLONOPIN) 0.5 MG tablet Take 0.5 mg by mouth only at the onset of a seizure  3  . EPINEPHrine (EPIPEN 2-PAK) 0.3 mg/0.3 mL IJ SOAJ injection Inject 0.3 mg into the muscle once as needed (for anaphylaxis).     . fluticasone (FLONASE) 50 MCG/ACT nasal spray Place 2 sprays into both nostrils daily as needed.  12  . montelukast (SINGULAIR) 10 MG tablet Take 1 tablet (10 mg total) by mouth at bedtime. 30 tablet 2  . OVER THE COUNTER MEDICATION Take 1 packet by mouth daily. The Nature Made Diabetes Health Pack (Multivitamin, Fish Oil, Vitamin D3, Magnesium,Vitamin C, Alpha Lipoic Acid, Chromium)    . zonisamide (ZONEGRAN)  100 MG capsule Take 400 mg by mouth at bedtime.     . fluconazole (DIFLUCAN) 150 MG tablet Take 1 tablet (150 mg total) by mouth every other day. (Patient not taking: Reported on 07/03/2019) 3 tablet 0   No current facility-administered medications on file prior to visit.    Allergies  Allergen Reactions  . Peanuts [Peanut Oil] Anaphylaxis  . Aspirin Other (See Comments)    Does take because of her epilepsy/seizure    Family History  Problem Relation Age of Onset  . Diabetes Mother   . Breast cancer Paternal Grandmother 27  . Cancer Paternal Grandmother   . Cancer Paternal Aunt   . Cancer Maternal Grandmother   . Heart disease Neg Hx    PE: BP 138/88 (BP Location: Left Arm, Patient Position: Sitting, Cuff Size: Large)   Pulse 97   Ht 5' 6"  (1.676 m)   Wt (!) 318 lb (144.2 kg)   SpO2 98%   BMI 51.33 kg/m  Wt Readings from Last 3 Encounters:  07/03/19 (!) 318 lb (144.2 kg)  05/25/19 (!) 321 lb 1.6 oz (145.7 kg)  05/13/19 (!) 316 lb (143.3 kg)   Constitutional: Obese, in NAD Eyes: PERRLA, EOMI, no exophthalmos ENT: moist mucous membranes, no thyromegaly, no cervical lymphadenopathy Cardiovascular: RRR, No MRG Respiratory: CTA B Gastrointestinal: abdomen soft, NT, ND, BS+ Musculoskeletal: no deformities, strength intact in all 4 Skin: moist, warm, no rashes Neurological: no tremor with outstretched hands, DTR normal in all 4  ASSESSMENT: 1. DM2, insulin-dependent, uncontrolled, with complications - MAU  2.  Thyroid nodules  PLAN:  1. atient with long-standing, uncontrolled diabetes, on oral antidiabetic regimen + long-acting insulin and daily GLP-1 receptor agonist, which became insufficient.  Latest HbA1c was reviewed with the patient and this was very high, and 10.9%.  Her sugars are very high in the morning and not lower late during the day, but still above target. -At this visit, we discussed about the concept of insulin resistance and I strongly advised her to  improve her diet by reducing fatty foods.  She is eating eggs and bacon every morning, which increases her insulin resistance and therefore worsens her diabetes.  Discussed  about other, healthier, breakfast foods.  I also explained that limiting fat will also help her decrease her LDL, which is now above target, at 145.  She deferred the referral to nutrition for now, but will consider this at next visit. -In the meantime, since she is having GI symptoms with metformin we move this with meals, as she is not taking it before meals.  Would not change the dose for now.  Since we cannot increase the dose of Soliqua beyond 50 units, and since we need to move the insulin at night to improve morning sugars, I advised her to stop Soliqua and switch to Lantus at bedtime and weekly Ozempic at high dose 1 mg.  Given coupon card. - I suggested to:  Patient Instructions  I will order the thyroid biopsy for you.  Please let me know if you do not hear from [facility] for imaging within the next week.  Please continue: - Metformin ER 750 mg twice a day but move it with meals  Stop Soliqua.  Start: - Lantus 50 units at bedtime (tomorrow take it at lunch) - Ozempic 1 mg weekly  Please return in 3 months with your sugar log.   - Strongly advised her to start checking sugars at different times of the day - check 1-2x a day, rotating checks - discussed about CBG targets for treatment: 80-130 mg/dL before meals and <180 mg/dL after meals; target HbA1c <7%. - given sugar log and advised how to fill it and to bring it at next appt  - given foot care handout and explained the principles  - given instructions for hypoglycemia management "15-15 rule"  - advised for yearly eye exams-she is up-to-date - Return to clinic in 3 mo with sugar log   2.  Thyroid nodules -No neck compression symptoms -Reviewed together latest thyroid test and these were normal - I explained that the risk of cancer is low and thyroid nodules are  very common. -Reviewed the report of her thyroid ultrasound along with the patient: She has a total of 4 nodules, 2 in every thyroid lobe, and of these, 2 nodules appear worrisome >> we need to biopsy these.  She agrees.  We will order the biopsies today.  -We discussed that if these are not cancerous, we will follow up clinically and with ultrasounds  Clinical History: Right inferior 2.3cm; Other 2 dimensions: 1.9 x 1.9cm,  Solid / almost completely solid, Hypoechoic, TI-RADS total points 4  Specimen Submitted: A. THYROID, RLP, FINE NEEDLE ASPIRATION:  DIAGNOSIS:  - Consistent with benign follicular nodule (Bethesda category II)  SPECIMEN ADEQUACY:  Satisfactory for evaluation   Clinical History: Left mid 1.8cm; Other 2 dimensions: 1.6 x 1.3cm, Solid  / almost completely solid, Hypoechoic, TI-RADS total points 4  Specimen Submitted: A. THYROID, LMP, FINE NEEDLE ASPIRATION:  DIAGNOSIS:  - Consistent with benign follicular nodule (Bethesda category II)  SPECIMEN ADEQUACY:  Satisfactory for evaluation  Philemon Kingdom, MD PhD [facility] Endocrinology

## 2019-07-03 NOTE — Patient Instructions (Addendum)
I will order the thyroid biopsy for you.  Please let me know if you do not hear from [facility] for imaging within the next week.  Please continue: - Metformin ER 750 mg twice a day but move it with meals  Stop Soliqua.  Start: - Lantus 50 units at bedtime (tomorrow take it at lunch) - Ozempic 1 mg weekly  Please return in 3 months with your sugar log.   PATIENT INSTRUCTIONS FOR TYPE 2 DIABETES:  **Please join MyChart!** - see attached instructions about how to join if you have not done so already.  DIET AND EXERCISE Diet and exercise is an important part of diabetic treatment.  We recommended aerobic exercise in the form of brisk walking (working between 40-60% of maximal aerobic capacity, similar to brisk walking) for 150 minutes per week (such as 30 minutes five days per week) along with 3 times per week performing 'resistance' training (using various gauge rubber tubes with handles) 5-10 exercises involving the major muscle groups (upper body, lower body and core) performing 10-15 repetitions (or near fatigue) each exercise. Start at half the above goal but build slowly to reach the above goals. If limited by weight, joint pain, or disability, we recommend daily walking in a swimming pool with water up to waist to reduce pressure from joints while allow for adequate exercise.    BLOOD GLUCOSES Monitoring your blood glucoses is important for continued management of your diabetes. Please check your blood glucoses 2-4 times a day: fasting, before meals and at bedtime (you can rotate these measurements - e.g. one day check before the 3 meals, the next day check before 2 of the meals and before bedtime, etc.).   HYPOGLYCEMIA (low blood sugar) Hypoglycemia is usually a reaction to not eating, exercising, or taking too much insulin/ other diabetes drugs.  Symptoms include tremors, sweating, hunger, confusion, headache, etc. Treat IMMEDIATELY with 15 grams of Carbs: . 4 glucose tablets .  cup  regular juice/soda . 2 tablespoons raisins . 4 teaspoons sugar . 1 tablespoon honey Recheck blood glucose in 15 mins and repeat above if still symptomatic/blood glucose <100.  RECOMMENDATIONS TO REDUCE YOUR RISK OF DIABETIC COMPLICATIONS: * Take your prescribed MEDICATION(S) * Follow a DIABETIC diet: Complex carbs, fiber rich foods, (monounsaturated and polyunsaturated) fats * AVOID saturated/trans fats, high fat foods, >2,300 mg salt per day. * EXERCISE at least 5 times a week for 30 minutes or preferably daily.  * DO NOT SMOKE OR DRINK more than 1 drink a day. * Check your FEET every day. Do not wear tightfitting shoes. Contact us if you develop an ulcer * See your EYE doctor once a year or more if needed * Get a FLU shot once a year * Get a PNEUMONIA vaccine once before and once after age 36 years  GOALS:  * Your Hemoglobin A1c of <7%  * fasting sugars need to be <130 * after meals sugars need to be <180 (2h after you start eating) * Your Systolic BP should be 962 or lower  * Your Diastolic BP should be 80 or lower  * Your HDL (Good Cholesterol) should be 40 or higher  * Your LDL (Bad Cholesterol) should be 100 or lower. * Your Triglycerides should be 150 or lower  * Your Urine microalbumin (kidney function) should be <30 * Your Body Mass Index should be 25 or lower    Please consider the following ways to cut down carbs and fat and increase fiber and  micronutrients in your diet: - substitute whole grain for white bread or pasta - substitute brown rice for white rice - substitute 90-calorie flat bread pieces for slices of bread when possible - substitute sweet potatoes or yams for white potatoes - substitute humus for margarine - substitute tofu for cheese when possible - substitute almond or rice milk for regular milk (would not drink soy milk daily due to concern for soy estrogen influence on breast cancer risk) - substitute dark chocolate for other sweets when possible -  substitute water - can add lemon or orange slices for taste - for diet sodas (artificial sweeteners will trick your body that you can eat sweets without getting calories and will lead you to overeating and weight gain in the long run) - do not skip breakfast or other meals (this will slow down the metabolism and will result in more weight gain over time)  - can try smoothies made from fruit and almond/rice milk in am instead of regular breakfast - can also try old-fashioned (not instant) oatmeal made with almond/rice milk in am - order the dressing on the side when eating salad at a restaurant (pour less than half of the dressing on the salad) - eat as little meat as possible - can try juicing, but should not forget that juicing will get rid of the fiber, so would alternate with eating raw veg./fruits or drinking smoothies - use as little oil as possible, even when using olive oil - can dress a salad with a mix of balsamic vinegar and lemon juice, for e.g. - use agave nectar, stevia sugar, or regular sugar rather than artificial sweateners - steam or broil/roast veggies  - snack on veggies/fruit/nuts (unsalted, preferably) when possible, rather than processed foods - reduce or eliminate aspartame in diet (it is in diet sodas, chewing gum, etc) Read the labels!  Try to read Dr. Janene Harvey book: "Program for Reversing Diabetes" for other ideas for healthy eating.

## 2019-08-05 ENCOUNTER — Ambulatory Visit
Admission: RE | Admit: 2019-08-05 | Discharge: 2019-08-05 | Disposition: A | Discharge status: Home / Self Care | Payer: 59 | Source: Ambulatory Visit | Attending: Internal Medicine | Admitting: Internal Medicine

## 2019-08-05 ENCOUNTER — Other Ambulatory Visit (HOSPITAL_COMMUNITY)
Admission: RE | Admit: 2019-08-05 | Discharge: 2019-08-05 | Disposition: A | Discharge status: Home / Self Care | Payer: 59 | Source: Ambulatory Visit | Attending: Radiology | Admitting: Radiology

## 2019-08-05 DIAGNOSIS — E042 Nontoxic multinodular goiter: Secondary | ICD-10-CM

## 2019-08-05 DIAGNOSIS — E041 Nontoxic single thyroid nodule: Secondary | ICD-10-CM | POA: Diagnosis present

## 2019-08-06 LAB — CYTOLOGY - NON PAP

## 2019-09-15 ENCOUNTER — Telehealth: Payer: Self-pay | Admitting: Family Medicine

## 2019-09-15 NOTE — Telephone Encounter (Signed)
Patient has called and said that she had an appt for a cpe with her ob/ gyn and they cancelled and that dr Ancil Boozer told her she could come her. She did not know if it was for a cpe w/ pap or if just a pap. Please clarify if cpe w pap or just a pap. Thnaks

## 2019-10-07 ENCOUNTER — Other Ambulatory Visit: Payer: Self-pay

## 2019-10-08 ENCOUNTER — Encounter: Payer: 59 | Admitting: Family Medicine

## 2019-10-09 ENCOUNTER — Ambulatory Visit (INDEPENDENT_AMBULATORY_CARE_PROVIDER_SITE_OTHER): Payer: 59 | Admitting: Internal Medicine

## 2019-10-09 ENCOUNTER — Encounter: Payer: Self-pay | Admitting: Internal Medicine

## 2019-10-09 ENCOUNTER — Other Ambulatory Visit: Payer: Self-pay

## 2019-10-09 DIAGNOSIS — R809 Proteinuria, unspecified: Secondary | ICD-10-CM | POA: Diagnosis not present

## 2019-10-09 DIAGNOSIS — E785 Hyperlipidemia, unspecified: Secondary | ICD-10-CM

## 2019-10-09 DIAGNOSIS — E1129 Type 2 diabetes mellitus with other diabetic kidney complication: Secondary | ICD-10-CM

## 2019-10-09 DIAGNOSIS — E042 Nontoxic multinodular goiter: Secondary | ICD-10-CM | POA: Diagnosis not present

## 2019-10-09 DIAGNOSIS — E1169 Type 2 diabetes mellitus with other specified complication: Secondary | ICD-10-CM | POA: Diagnosis not present

## 2019-10-09 DIAGNOSIS — Z794 Long term (current) use of insulin: Secondary | ICD-10-CM

## 2019-10-09 NOTE — Progress Notes (Signed)
Patient ID: Erin Good, female   DOB: 1976/08/20, 43 y.o.   MRN: [id number]   Patient location: Home My location: Office Persons participating in the virtual visit: patient, provider  Referring Provider: Steele Sizer, MD  I connected with the patient on 10/09/19 at  8:02 AM EST by a video enabled telemedicine application and verified that I am speaking with the correct person.   I discussed the limitations of evaluation and management by telemedicine and the availability of in person appointments. The patient expressed understanding and agreed to proceed.   Details of the encounter are shown below.  HPI: Erin Good is a 43 y.o.-year-old female, initially referred by her PCP, Dr. Ancil Boozer, presenting for follow-up for DM2, dx in ~2015, insulin-dependent since 2019, uncontrolled, with microalbuminuria and also for thyroid nodules.  Last visit 3 months ago.  She is working from home.  Reviewed HbA1c levels: Lab Results  Component Value Date   HGBA1C 10.9 (H) 05/25/2019   HGBA1C 10.0 (A) 01/29/2019   HGBA1C 10.4 (A) 04/01/2018   HGBA1C 9.8 12/18/2017   HGBA1C 9.4 09/17/2017   HGBA1C 10.9 06/13/2017   HGBA1C 10.3 (H) 01/31/2017   HGBA1C 9.9 10/03/2016   HGBA1C 9.0 (A) 09/14/2014   Pt was on a regimen of: - Metformin ER 750 mg 2x a day, with meals - Soliqua (Lantus + Lixisenatide) 50 units daily in am  At last visit, we changed to: - Metformin ER 750 mg 2x a day with meals - Lantus 50 units at bedtime  - Ozempic 1 mg weekly  Please return in 3 months with your sugar log. Pt checks her sugars 1-2 times a day: - am: 220-230 >> 146-200, 280 - 2h after b'fast: n/c - before lunch: n/c - 2h after lunch: 170-180 >> 98-118 - before dinner: n/c >> 120-150 - 2h after dinner: n/c - bedtime: n/c - nighttime: n/c Lowest sugar was 96 (after a meal) >> 98; + hypoglycemia awareness at 100 Highest sugar was 300 >> 280  Glucometer: [employer] advance  Pt's meals are: -  Breakfast: 2 eggs + 3 pieces of bacon - snack: fruit - Lunch: salad - Dinner:meat + veggie + starch (rice + pasta) - Snacks: 1  Reducing sodas: Coke >> sparkling water  -No CKD, last BUN/creatinine:  Lab Results  Component Value Date   BUN 13 01/29/2019   BUN 5 (L) 09/10/2018   CREATININE 0.66 01/29/2019   CREATININE 0.57 09/10/2018  She is not on ACE inhibitor/ARB.  Latest ACR was slightly high: Lab Results  Component Value Date   MICRALBCREAT 30 (H) 01/29/2019   -+ HL; last set of lipids: Lab Results  Component Value Date   CHOL 219 (H) 01/29/2019   HDL 42 (L) 01/29/2019   LDLCALC 154 (H) 01/29/2019   TRIG 112 01/29/2019   CHOLHDL 5.2 (H) 01/29/2019  She is not on a statin.  - last eye exam was in 10/2018: No DR  - No numbness and tingling in her feet.  Pt has FH of DM in mother, MGF.  Thyroid nodules:  Reviewed previous work-up along with the patient: 06/01/2019: Thyroid ultrasound: Parenchymal Echotexture: Mildly heterogenous Isthmus: 0.4 cm thickness Right lobe: 5 x 2.4 x 2.8 cm Left lobe: 4.5 x 2 x 2.1 cm  Estimated total number of nodules >/= 1 cm: 4  Nodule # 1: Location: Right; Mid Maximum size: 1.6 cm; Other 2 dimensions: 1.5 x 1.4 cm Composition: mixed cystic and solid (1) Echogenicity: hypoechoic (2) *Given  size (>/= 1.5 - 2.4 cm) and appearance, a follow-up ultrasound in 1 year should be considered based on TI-RADS criteria. _________________________________________________________  Nodule # 2: Location: Right; Inferior Maximum size: 2.3 cm; Other 2 dimensions: 1.9 x 1.9 cm Composition: solid/almost completely solid (2) Echogenicity: hypoechoic (2) **Given size (>/= 1.5 cm) and appearance, fine needle aspiration of this moderately suspicious nodule should be considered based on TI-RADS criteria. _________________________________________________  Nodule # 3: Location: Left; Inferior Maximum size: 1.4 cm; Other 2 dimensions: 1.2 x  1.2 cm Composition: solid/almost completely solid (2) Echogenicity: isoechoic (1) Given size (<1.4 cm) and appearance, this nodule does NOT meet TI-RADS criteria for biopsy or dedicated follow-up. _________________________________________________________  Nodule # 4: Location: Left; Mid Maximum size: 1.8 cm; Other 2 dimensions: 1.6 x 1.3 cm Composition: solid/almost completely solid (2) Echogenicity: hypoechoic (2) **Given size (>/= 1.5 cm) and appearance, fine needle aspiration of this moderately suspicious nodule should be considered based on TI-RADS criteria.  IMPRESSION: 1. Thyromegaly with bilateral nodules. 2. Recommend FNA biopsy of moderately suspicious 1.8 cm mid left AND 2.3 cm inferior right nodules. 3. Recommend annual/biennial ultrasound follow-up of additional nodules as above, until stability x5 years confirmed.  08/05/2019: FNA of the 2 nodules: Clinical History: Right inferior 2.3cm; Other 2 dimensions: 1.9 x 1.9cm,  Solid / almost completely solid, Hypoechoic, TI-RADS total points 4  Specimen Submitted: A. THYROID, RLP, FINE NEEDLE ASPIRATION:  DIAGNOSIS:  - Consistent with benign follicular nodule (Bethesda category II)  SPECIMEN ADEQUACY:  Satisfactory for evaluation   Clinical History: Left mid 1.8cm; Other 2 dimensions: 1.6 x 1.3cm, Solid  / almost completely solid, Hypoechoic, TI-RADS total points 4  Specimen Submitted: A. THYROID, LMP, FINE NEEDLE ASPIRATION:  DIAGNOSIS:  - Consistent with benign follicular nodule (Bethesda category II)  SPECIMEN ADEQUACY:  Satisfactory for evaluation  Reviewed her TFTs: Lab Results  Component Value Date   TSH 1.35 05/25/2019   TSH 1.070 07/28/2015   TSH 1.13 10/15/2014   Pt denies: - feeling nodules in neck - hoarseness - dysphagia - choking - SOB with lying down  She also has a history of PCOS and distant history of epilepsy at 43 years old.  ROS: Constitutional: no weight gain/+ 11 lbs weight  loss, no fatigue, no subjective hyperthermia, no subjective hypothermia Eyes: no blurry vision, no xerophthalmia ENT: no sore throat, + see HPI Cardiovascular: no CP/no SOB/no palpitations/no leg swelling Respiratory: no cough/no SOB/no wheezing Gastrointestinal: no N/no V/no D/no C/no acid reflux Musculoskeletal: no muscle aches/no joint aches Skin: no rashes, no hair loss Neurological: no tremors/no numbness/no tingling/no dizziness  I reviewed pt's medications, allergies, PMH, social hx, family hx, and changes were documented in the history of present illness. Otherwise, unchanged from my initial visit note.  Past Medical History:  Diagnosis Date  . Allergic rhinitis   . Breast discharge 06/05/2017   2 weeks ago left  . Breast mass 12/06/2016   left  . Diabetes mellitus without complication ([place])   . Dyslipidemia   . Epilepsy ([place])   . Epilepsy ([place])   . Febrile seizures ([place])   . Galactorrhea   . Hx gestational diabetes   . Kidney stones   . Obesity   . Seizures ([place])   . Sleep apnea   . Syncope and collapse   . Tachycardia    Past Surgical History:  Procedure Laterality Date  . BREAST BIOPSY Left 2018   benign  . CESAREAN SECTION     X 2  .  RIGHT OOPHORECTOMY Right 2001   benign tumor  . TUBAL LIGATION  2007   Social History   Socioeconomic History  . Marital status: Married    Spouse name: Roderic Palau  . Number of children: 2  . Years of education: College  . Highest education level: Not on file  Occupational History  . Occupation: Optometrist  Social Needs  . Financial resource strain: Not hard at all  . Food insecurity    Worry: Never true    Inability: Never true  . Transportation needs    Medical: No    Non-medical: No  Tobacco Use  . Smoking status: Never Smoker  . Smokeless tobacco: Never Used  Substance and Sexual Activity  . Alcohol use: No  . Drug use: No  . Sexual activity: Not on file  Lifestyle  . Physical activity    Days per week:  0 days    Minutes per session: 0 min  . Stress: Only a little  Relationships  . Social connections    Talks on phone: More than three times a week    Gets together: More than three times a week    Attends religious service: More than 4 times per year    Active member of club or organization: Yes    Attends meetings of clubs or organizations: More than 4 times per year    Relationship status: Married  . Intimate partner violence    Fear of current or ex partner: No    Emotionally abused: No    Physically abused: No    Forced sexual activity: No  Other Topics Concern  . Not on file  Social History Narrative  . Not on file   Meds: - Metformin ER 750 mg 2x a day, with meals - Soliqua (Lantus + Lixisenatide) 50 units dialy in am + at Current Outpatient Medications on File Prior to Visit  Medication Sig Dispense Refill  . cholecalciferol (VITAMIN D) 1000 units tablet Take 1 tablet (1,000 Units total) by mouth daily. 30 tablet 0  . clonazePAM (KLONOPIN) 0.5 MG tablet Take 0.5 mg by mouth only at the onset of a seizure  3  . EPINEPHrine (EPIPEN 2-PAK) 0.3 mg/0.3 mL IJ SOAJ injection Inject 0.3 mg into the muscle once as needed (for anaphylaxis).     . fluconazole (DIFLUCAN) 150 MG tablet Take 1 tablet (150 mg total) by mouth every other day. (Patient not taking: Reported on 07/03/2019) 3 tablet 0  . fluticasone (FLONASE) 50 MCG/ACT nasal spray Place 2 sprays into both nostrils daily as needed.  12  . glucose blood (ONETOUCH VERIO) test strip Use 2x a day with OneTouch Verio Flex 200 each 3  . Insulin Glargine (LANTUS SOLOSTAR) 100 UNIT/ML Solostar Pen Inject 50 Units into the skin daily. 10 pen 5  . Insulin Pen Needle 32G X 4 MM MISC Use 1x a day 100 each 3  . metFORMIN (GLUCOPHAGE XR) 750 MG 24 hr tablet Take 2 tablets (1,500 mg total) by mouth daily. 180 tablet 3  . montelukast (SINGULAIR) 10 MG tablet Take 1 tablet (10 mg total) by mouth at bedtime. 30 tablet 2  . OneTouch Delica  Lancets 96E MISC Use 2x a day with OneTouch Verio Flex 200 each 3  . OVER THE COUNTER MEDICATION Take 1 packet by mouth daily. The Nature Made Diabetes Health Pack (Multivitamin, Fish Oil, Vitamin D3, Magnesium,Vitamin C, Alpha Lipoic Acid, Chromium)    . Semaglutide, 1 MG/DOSE, (OZEMPIC, 1 MG/DOSE,) 2 MG/1.5ML  SOPN Inject 1 mg into the skin once a week. 2 pen 5  . zonisamide (ZONEGRAN) 100 MG capsule Take 400 mg by mouth at bedtime.      No current facility-administered medications on file prior to visit.    Allergies  Allergen Reactions  . Peanuts [Peanut Oil] Anaphylaxis  . Aspirin Other (See Comments)    Does take because of her epilepsy/seizure    Family History  Problem Relation Age of Onset  . Diabetes Mother   . Breast cancer Paternal Grandmother 58  . Cancer Paternal Grandmother   . Cancer Paternal Aunt   . Cancer Maternal Grandmother   . Heart disease Neg Hx    PE: There were no vitals taken for this visit. Wt Readings from Last 3 Encounters:  07/03/19 (!) 318 lb (144.2 kg)  05/25/19 (!) 321 lb 1.6 oz (145.7 kg)  05/13/19 (!) 316 lb (143.3 kg)   Constitutional:  in NAD  The physical exam was not performed (virtual visit).  ASSESSMENT: 1. DM2, insulin-dependent, uncontrolled, with complications - MAU  2.  Thyroid nodules  3. HL  PLAN:  1. Patient with longstanding, uncontrolled, type 2 diabetes, on oral antidiabetic regimen with metformin ER, also daily long-acting insulin and weekly GLP-1 receptor agonist, regimen changed at last visit at that time, HbA1c was high at 10.9%.  Her sugars were very high in the morning and lower later in the day but still above target.  At last visit, we discussed about the concept of insulin resistance and I strongly advised her to improve her diet by reducing fatty foods.  I gave her examples of healthier foods.  We also moved Metformin with meals since she was having GI symptoms with it.  We stopped [place] since she was at the  upper limit for dosing and will switch to Ozempic and Lantus. -At this visit, sugars are much better, almost at goal during the day (especially midday), but still high in the morning.  She is not checking sugars after dinner or at bedtime and I advised her to start doing so to see if the reason for the high blood sugars in the morning is hyperglycemia after dinner.  In the meantime, I advised her to increase Lantus to 55 units at bedtime and also to try to move the entire metformin dose with dinner.  She is having some loose stools with the current regimen so I advised her that if this is not possible, to split it back to the current regimen. -She feels better after she started Ozempic and she also lost 11 pounds! No GI side effects.  We will continue the same dose. - I suggested to:  Patient Instructions  Please continue: - Metformin ER 750 mg 2x a day with meals (try to take 1500 mg with dinner) - Ozempic 1 mg weekly  Please increase: - Lantus to 55 units at bedtime   Check some sugars 2h after dinner or at bedtime.  Please return in 3 months with your sugar log.   - we will check her HbA1c when she returns to the clinic - advised to check sugars at different times of the day - 2x a day, rotating check times - advised for yearly eye exams >> she is UTD - return to clinic in 3 months   2.  Thyroid nodules -She denies neck compression symptoms -The results of her most recent ultrasound and also the 2 thyroid nodule biopsies performed since last visit.  These were benign. -  We will repeat another thyroid ultrasound in a year  3. HL - Reviewed latest lipid panel from earlier in the year: LDL high, HDL low, joint size at goal Lab Results  Component Value Date   CHOL 219 (H) 01/29/2019   HDL 42 (L) 01/29/2019   LDLCALC 154 (H) 01/29/2019   TRIG 112 01/29/2019   CHOLHDL 5.2 (H) 01/29/2019  -She is not on a statin -We will need to recheck her lipid panel after her diabetes improves and  may need a statin afterwards if LDL remains high  Philemon Kingdom, MD PhD [facility]

## 2019-10-09 NOTE — Patient Instructions (Addendum)
Please continue: - Metformin ER 750 mg 2x a day with meals (try to take 1500 mg with dinner) - Ozempic 1 mg weekly  Please increase: - Lantus to 55 units at bedtime   Check some sugars 2h after dinner or at bedtime.  Please return in 3 months with your sugar log.

## 2019-11-12 ENCOUNTER — Encounter: Payer: Self-pay | Admitting: Family Medicine

## 2019-11-12 ENCOUNTER — Other Ambulatory Visit (HOSPITAL_COMMUNITY)
Admission: RE | Admit: 2019-11-12 | Discharge: 2019-11-12 | Disposition: A | Discharge status: Home / Self Care | Payer: 59 | Source: Ambulatory Visit | Attending: Family Medicine | Admitting: Family Medicine

## 2019-11-12 ENCOUNTER — Ambulatory Visit (INDEPENDENT_AMBULATORY_CARE_PROVIDER_SITE_OTHER): Payer: 59 | Admitting: Family Medicine

## 2019-11-12 ENCOUNTER — Other Ambulatory Visit: Payer: Self-pay

## 2019-11-12 VITALS — BP 130/80 | HR 118 | Temp 97.3°F | Resp 16 | Ht 65.5 in | Wt 308.4 lb

## 2019-11-12 DIAGNOSIS — G40409 Other generalized epilepsy and epileptic syndromes, not intractable, without status epilepticus: Secondary | ICD-10-CM

## 2019-11-12 DIAGNOSIS — Z124 Encounter for screening for malignant neoplasm of cervix: Secondary | ICD-10-CM | POA: Diagnosis present

## 2019-11-12 DIAGNOSIS — Z794 Long term (current) use of insulin: Secondary | ICD-10-CM | POA: Diagnosis not present

## 2019-11-12 DIAGNOSIS — E1129 Type 2 diabetes mellitus with other diabetic kidney complication: Secondary | ICD-10-CM

## 2019-11-12 DIAGNOSIS — J301 Allergic rhinitis due to pollen: Secondary | ICD-10-CM

## 2019-11-12 DIAGNOSIS — E785 Hyperlipidemia, unspecified: Secondary | ICD-10-CM

## 2019-11-12 DIAGNOSIS — E042 Nontoxic multinodular goiter: Secondary | ICD-10-CM

## 2019-11-12 DIAGNOSIS — M25552 Pain in left hip: Secondary | ICD-10-CM

## 2019-11-12 DIAGNOSIS — Z0000 Encounter for general adult medical examination without abnormal findings: Secondary | ICD-10-CM

## 2019-11-12 DIAGNOSIS — N941 Unspecified dyspareunia: Secondary | ICD-10-CM

## 2019-11-12 DIAGNOSIS — Z79899 Other long term (current) drug therapy: Secondary | ICD-10-CM

## 2019-11-12 DIAGNOSIS — E559 Vitamin D deficiency, unspecified: Secondary | ICD-10-CM

## 2019-11-12 DIAGNOSIS — N920 Excessive and frequent menstruation with regular cycle: Secondary | ICD-10-CM

## 2019-11-12 DIAGNOSIS — R809 Proteinuria, unspecified: Secondary | ICD-10-CM

## 2019-11-12 DIAGNOSIS — Z23 Encounter for immunization: Secondary | ICD-10-CM

## 2019-11-12 DIAGNOSIS — G4731 Primary central sleep apnea: Secondary | ICD-10-CM

## 2019-11-12 DIAGNOSIS — E1169 Type 2 diabetes mellitus with other specified complication: Secondary | ICD-10-CM

## 2019-11-12 LAB — POCT GLYCOSYLATED HEMOGLOBIN (HGB A1C): Hemoglobin A1C: 8 % — AB (ref 4.0–5.6)

## 2019-11-12 NOTE — Progress Notes (Signed)
Name: Erin Good   MRN: [id number]    DOB: 09-28-1976   Date:11/12/2019       Progress Note  Subjective  Chief Complaint  Chief Complaint  Patient presents with  . Annual Exam    HPI  Patient presents for annual CPE.  DMII: with history of dyslipidemia, obesity and microalbuminuria. She is seeing Dr. Renne Crigler and is now on Lantus and Metformin, Ozempic and is cutting down on portion and carbohydrates. She states average 150's fasting, highest of 200, low of 90's. No polyphagia, polydipsia or polyuria. Eye exam is due and she will schedule it A1C has gone down from 10.9 to 8   Niland Mal Seizure: it has been a few years, still taking medications and see neurolgoist, driving and no recent problems.   Obesity: lost 10 lbs since last year, portion control, not exercising enough  Thyroid nodules: biopsy negative  Vitamin D deficiency: taking supplementation   Left hip pain: going on since Fall 2020, but worse in the past month, states unable to lift her left leg when internally rotate, pain is intermittent when she goes from sitting to standing or standing to sitting. Legs feels like is locking. Pain is described as aching and sometimes sharp. She fell from a portable chair into the concrete floor in August 2020 but pain was not severe at that time   Diet: cutting down on carbohydrates also cutting down on portion size  Exercise: discussed 150 minutes per week   USPSTF grade A and B recommendations    Office Visit from 11/12/2019 in [facility]  AUDIT-C Score  0     Depression: Phq 9 is  negative Depression screen [facility] 2/9 11/12/2019 05/25/2019 05/13/2019 09/17/2018 04/01/2018  Decreased Interest 0 0 0 0 1  Down, Depressed, Hopeless 0 0 0 0 0  PHQ - 2 Score 0 0 0 0 1  Altered sleeping 0 0 0 0 0  Tired, decreased energy 0 2 1 0 2  Change in appetite 0 0 0 0 1  Feeling bad or failure about yourself  0 0 0 0 0  Trouble concentrating 0 0 0 0 0  Moving slowly or  fidgety/restless 0 0 0 0 1  Suicidal thoughts 0 0 0 0 0  PHQ-9 Score 0 2 1 0 5  Difficult doing work/chores - Not difficult at all Not difficult at all Not difficult at all Not difficult at all   Hypertension: BP Readings from Last 3 Encounters:  11/12/19 130/80  07/03/19 138/88  05/25/19 132/84   Obesity: Wt Readings from Last 3 Encounters:  11/12/19 (!) 308 lb 6.4 oz (139.9 kg)  07/03/19 (!) 318 lb (144.2 kg)  05/25/19 (!) 321 lb 1.6 oz (145.7 kg)   BMI Readings from Last 3 Encounters:  11/12/19 50.54 kg/m  07/03/19 51.33 kg/m  05/25/19 51.83 kg/m     Hep C Screening: today  STD testing and prevention (HIV/chl/gon/syphilis): N/A Intimate partner violence: negative  Sexual History (Partners/Practices/Protection from [employer] hx STI/Pregnancy Plans): married, one partner, no history of STI, regular cycles, heavy cycles and has severe cramping and seems to be getting worse over the past couple of years  Pain during Intercourse: she has pain during intercourse over the past year , described as stabbing sensation inside going on for about one year Menstrual History/LMP/Abnormal Bleeding: LMP 10/31/2019, heavy with clots and cramping  Incontinence Symptoms: no symptoms   Breast cancer:  - Last Mammogram: 05/2019 - BRCA gene screening:  N/A  Osteoporosis: Discussed high calcium and vitamin D supplementation, weight bearing exercises  Cervical cancer screening: today   Skin cancer: Discussed monitoring for atypical lesions  Colorectal cancer: start at 45  ECG: 2018   Advanced Care Planning: A voluntary discussion about advance care planning including the explanation and discussion of advance directives.  Discussed health care proxy and Living will, and the patient was able to identify a health care proxy as husband   Patient does not have a living will at present time.  Lipids: Lab Results  Component Value Date   CHOL 219 (H) 01/29/2019   CHOL 218 (H) 06/13/2017   CHOL  214 (H) 10/03/2016   Lab Results  Component Value Date   HDL 42 (L) 01/29/2019   HDL 39 (L) 06/13/2017   HDL 38 (L) 10/03/2016   Lab Results  Component Value Date   LDLCALC 154 (H) 01/29/2019   LDLCALC 158 (H) 06/13/2017   LDLCALC 149 (H) 10/03/2016   Lab Results  Component Value Date   TRIG 112 01/29/2019   TRIG 104 06/13/2017   TRIG 133 10/03/2016   Lab Results  Component Value Date   CHOLHDL 5.2 (H) 01/29/2019   CHOLHDL 5.6 (H) 06/13/2017   CHOLHDL 5.6 (H) 10/03/2016   No results found for: LDLDIRECT  Glucose: Glucose, Bld  Date Value Ref Range Status  01/29/2019 288 (H) 65 - 99 mg/dL Final    Comment:    .            Fasting reference interval . For someone without known diabetes, a glucose value >125 mg/dL indicates that they may have diabetes and this should be confirmed with a follow-up test. .   09/10/2018 297 (H) 70 - 99 mg/dL Final  03/31/2018 319 (H) 65 - 99 mg/dL Final   Glucose-Capillary  Date Value Ref Range Status  03/31/2018 205 (H) 65 - 99 mg/dL Final  12/03/2017 248 (H) 65 - 99 mg/dL Final  12/01/2016 172 (H) 65 - 99 mg/dL Final    Patient Active Problem List   Diagnosis Date Noted  . Vitamin D deficiency 01/31/2017  . Right shoulder tendinitis 01/31/2017  . Tendinosis 01/31/2017  . Bell's palsy 12/19/2016  . Tachycardia 09/30/2016  . Depression with anxiety 05/06/2015  . Allergic rhinitis 04/20/2015  . Anxiety and depression 04/20/2015  . 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 Gastro-esophageal reflux disease without esophagitis 04/20/2015  . Dysmetabolic syndrome 48/18/5631  . Extreme obesity 04/20/2015  . NASH (nonalcoholic steatohepatitis) 04/20/2015  . Allergy to nuts 04/20/2015  . Calculus of kidney 04/20/2015  . Type 2 diabetes mellitus with renal manifestations ([place]) 04/20/2015  . Central sleep apnea 11/26/2008  . Dyslipidemia 07/02/2008  . Leukocytosis 07/29/2007    Past Surgical History:  Procedure  Laterality Date  . BREAST BIOPSY Left 2018   benign  . CESAREAN SECTION     X 2  . RIGHT OOPHORECTOMY Right 2001   benign tumor  . TUBAL LIGATION  2007    Family History  Problem Relation Age of Onset  . Diabetes Mother   . Breast cancer Paternal Grandmother 54  . Cancer Paternal Grandmother   . Cancer Paternal Aunt   . Cancer Maternal Grandmother   . Heart disease Neg Hx     Social History   Socioeconomic History  . Marital status: Married    Spouse name: Roderic Palau  . Number of children: 2  . Years of education: College  . Highest education  level: Not on file  Occupational History  . Occupation: Optometrist  Tobacco Use  . Smoking status: Never Smoker  . Smokeless tobacco: Never Used  Substance and Sexual Activity  . Alcohol use: No  . Drug use: No  . Sexual activity: Not on file  Other Topics Concern  . Not on file  Social History Narrative  . Not on file   Social Determinants of Health   Financial Resource Strain: Low Risk   . Difficulty of Paying Living Expenses: Not hard at all  Food Insecurity: No Food Insecurity  . Worried About Charity fundraiser in the Last Year: Never true  . Ran Out of Food in the Last Year: Never true  Transportation Needs: No Transportation Needs  . Lack of Transportation (Medical): No  . Lack of Transportation (Non-Medical): No  Physical Activity: Inactive  . Days of Exercise per Week: 0 days  . Minutes of Exercise per Session: 0 min  Stress: No Stress Concern Present  . Feeling of Stress : Not at all  Social Connections: Somewhat Isolated  . Frequency of Communication with Friends and Family: More than three times a week  . Frequency of Social Gatherings with Friends and Family: More than three times a week  . Attends Religious Services: Never  . Active Member of Clubs or Organizations: No  . Attends Archivist Meetings: Never  . Marital Status: Married  Human resources officer Violence: Not At Risk  . Fear of Current  or Ex-Partner: No  . Emotionally Abused: No  . Physically Abused: No  . Sexually Abused: No     Current Outpatient Medications:  .  cholecalciferol (VITAMIN D) 1000 units tablet, Take 1 tablet (1,000 Units total) by mouth daily., Disp: 30 tablet, Rfl: 0 .  clonazePAM (KLONOPIN) 0.5 MG tablet, Take 0.5 mg by mouth only at the onset of a seizure, Disp: , Rfl: 3 .  EPINEPHrine (EPIPEN 2-PAK) 0.3 mg/0.3 mL IJ SOAJ injection, Inject 0.3 mg into the muscle once as needed (for anaphylaxis). , Disp: , Rfl:  .  fluticasone (FLONASE) 50 MCG/ACT nasal spray, Place 2 sprays into both nostrils daily as needed., Disp: , Rfl: 12 .  glucose blood (ONETOUCH VERIO) test strip, Use 2x a day with OneTouch Verio Flex, Disp: 200 each, Rfl: 3 .  Insulin Glargine (LANTUS SOLOSTAR) 100 UNIT/ML Solostar Pen, Inject 50 Units into the skin daily., Disp: 10 pen, Rfl: 5 .  Insulin Pen Needle 32G X 4 MM MISC, Use 1x a day, Disp: 100 each, Rfl: 3 .  metFORMIN (GLUCOPHAGE XR) 750 MG 24 hr tablet, Take 2 tablets (1,500 mg total) by mouth daily., Disp: 180 tablet, Rfl: 3 .  montelukast (SINGULAIR) 10 MG tablet, Take 1 tablet (10 mg total) by mouth at bedtime., Disp: 30 tablet, Rfl: 2 .  OneTouch Delica Lancets 02D MISC, Use 2x a day with OneTouch Verio Flex, Disp: 200 each, Rfl: 3 .  OVER THE COUNTER MEDICATION, Take 1 packet by mouth daily. The Nature Made Diabetes Health Pack (Multivitamin, Fish Oil, Vitamin D3, Magnesium,Vitamin C, Alpha Lipoic Acid, Chromium), Disp: , Rfl:  .  Semaglutide, 1 MG/DOSE, (OZEMPIC, 1 MG/DOSE,) 2 MG/1.5ML SOPN, Inject 1 mg into the skin once a week., Disp: 2 pen, Rfl: 5 .  zonisamide (ZONEGRAN) 100 MG capsule, Take 400 mg by mouth at bedtime. , Disp: , Rfl:   Allergies  Allergen Reactions  . Peanuts [Peanut Oil] Anaphylaxis  . Aspirin Other (See Comments)  Does take because of her epilepsy/seizure      ROS  Constitutional: Negative for fever, positive for  weight change.  Respiratory:  Negative for cough and shortness of breath.   Cardiovascular: Negative for chest pain or palpitations.  Gastrointestinal: Negative for abdominal pain, no bowel changes.  Musculoskeletal: Negative for gait problem or joint swelling.  Skin: Negative for rash.  Neurological: Negative for dizziness or headache.  No other specific complaints in a complete review of systems (except as listed in HPI above).  Objective  Vitals:   11/12/19 1003  BP: 130/80  Pulse: (!) 118  Resp: 16  Temp: (!) 97.3 F (36.3 C)  TempSrc: Temporal  SpO2: 97%  Weight: (!) 308 lb 6.4 oz (139.9 kg)  Height: 5' 5.5" (1.664 m)    Body mass index is 50.54 kg/m.  Physical Exam  Constitutional: Patient appears well-developed and well-nourished. No distress.  HENT: Head: Normocephalic and atraumatic. Ears: B TMs ok, no erythema or effusion; Nose: Nose normal. Mouth/Throat: not done Eyes: Conjunctivae and EOM are normal. Pupils are equal, round, and reactive to light. No scleral icterus.  Neck: Normal range of motion. Neck supple. No JVD present. Cardiovascular: Normal rate, regular rhythm and normal heart sounds.  No murmur heard. No BLE edema. Pulmonary/Chest: Effort normal and breath sounds normal. No respiratory distress. Abdominal: Soft. Bowel sounds are normal, no distension. There is no tenderness. no masses Breast: no lumps or masses, no nipple discharge or rashes FEMALE GENITALIA:  External genitalia normal External urethra normal Vaginal vault normal without discharge or lesions Cervix normal without discharge or lesions Bimanual exam normal without masses, but very difficult exam secondary to weight  RECTAL: no rectal masses or hemorrhoids Musculoskeletal: pain during abduction and internal rotation of left hip Neurological: he is alert and oriented to person, place, and time. No cranial nerve deficit. Coordination, balance, strength, speech and gait are normal.  Skin: Skin is warm and dry. No rash  noted. No erythema.  Psychiatric: Patient has a normal mood and affect. behavior is normal. Judgment and thought content normal.   Fall Risk: Fall Risk  11/12/2019 05/25/2019 05/13/2019 01/29/2019 09/17/2018  Falls in the past year? 0 0 0 0 0  Number falls in past yr: 0 0 0 0 -  Injury with Fall? 0 0 0 0 -  Comment - - - - -     Functional Status Survey: Is the patient deaf or have difficulty hearing?: No Does the patient have difficulty seeing, even when wearing glasses/contacts?: No Does the patient have difficulty concentrating, remembering, or making decisions?: No Does the patient have difficulty walking or climbing stairs?: No Does the patient have difficulty dressing or bathing?: No Does the patient have difficulty doing errands alone such as visiting a doctor's office or shopping?: No   Assessment & Plan  1. Type 2 diabetes mellitus with microalbuminuria, with long-term current use of insulin (HCC)  - POCT HgB A1C - Microalbumin / creatinine urine ratio  2. Need for immunization against influenza  - Flu Vaccine QUAD 36+ mos IM  3. Well adult exam   4. Cervical cancer screening  - Cytology - PAP  5. Multiple thyroid nodules   6. Morbid obesity ([place])   7. Vitamin D deficiency  - VITAMIN D 25 Hydroxy (Vit-D Deficiency, Fractures)  8. Central sleep apnea  Not compliant   9. Dyslipidemia associated with type 2 diabetes mellitus (HCC)  - COMPLETE METABOLIC PANEL WITH GFR - Lipid panel  10.  Dyspareunia in female  - US Pelvic Complete With Transvaginal; Future  11. Menorrhagia with regular cycle  - US Pelvic Complete With Transvaginal; Future  12. Long-term use of high-risk medication  - COMPLETE METABOLIC PANEL WITH GFR - CBC with Differential/Platelet  13. 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 Seasonal allergic rhinitis due to pollen   15. Left hip pain  - Ambulatory referral to Orthopedic Surgery  -USPSTF grade A and B recommendations  reviewed with patient; age-appropriate recommendations, preventive care, screening tests, etc discussed and encouraged; healthy living encouraged; see AVS for patient education given to patient -Discussed importance of 150 minutes of physical activity weekly, eat two servings of fish weekly, eat one serving of tree nuts ( cashews, pistachios, pecans, almonds.Marland Kitchen) every other day, eat 6 servings of fruit/vegetables daily and drink plenty of water and avoid sweet beverages.

## 2019-11-12 NOTE — Patient Instructions (Signed)

## 2019-11-13 LAB — CBC WITH DIFFERENTIAL/PLATELET
Absolute Monocytes: 640 cells/uL (ref 200–950)
Basophils Absolute: 48 cells/uL (ref 0–200)
Basophils Relative: 0.3 %
Eosinophils Absolute: 16 cells/uL (ref 15–500)
Eosinophils Relative: 0.1 %
HCT: 38.4 % (ref 35.0–45.0)
Hemoglobin: 12.8 g/dL (ref 11.7–15.5)
Lymphs Abs: 3936 cells/uL — ABNORMAL HIGH (ref 850–3900)
MCH: 26.5 pg — ABNORMAL LOW (ref 27.0–33.0)
MCHC: 33.3 g/dL (ref 32.0–36.0)
MCV: 79.5 fL — ABNORMAL LOW (ref 80.0–100.0)
MPV: 11.5 fL (ref 7.5–12.5)
Monocytes Relative: 4 %
Neutro Abs: 11360 cells/uL — ABNORMAL HIGH (ref 1500–7800)
Neutrophils Relative %: 71 %
Platelets: 370 10*3/uL (ref 140–400)
RBC: 4.83 10*6/uL (ref 3.80–5.10)
RDW: 13.1 % (ref 11.0–15.0)
Total Lymphocyte: 24.6 %
WBC: 16 10*3/uL — ABNORMAL HIGH (ref 3.8–10.8)

## 2019-11-13 LAB — COMPLETE METABOLIC PANEL WITH GFR
AG Ratio: 1.2 (calc) (ref 1.0–2.5)
ALT: 18 U/L (ref 6–29)
AST: 13 U/L (ref 10–30)
Albumin: 4.1 g/dL (ref 3.6–5.1)
Alkaline phosphatase (APISO): 94 U/L (ref 31–125)
BUN: 9 mg/dL (ref 7–25)
CO2: 24 mmol/L (ref 20–32)
Calcium: 9.4 mg/dL (ref 8.6–10.2)
Chloride: 103 mmol/L (ref 98–110)
Creat: 0.65 mg/dL (ref 0.50–1.10)
GFR, Est African American: 127 mL/min/{1.73_m2} (ref >=60)
GFR, Est Non African American: 110 mL/min/{1.73_m2} (ref >=60)
Globulin: 3.4 g/dL (calc) (ref 1.9–3.7)
Glucose, Bld: 175 mg/dL — ABNORMAL HIGH (ref 65–99)
Potassium: 3.9 mmol/L (ref 3.5–5.3)
Sodium: 137 mmol/L (ref 135–146)
Total Bilirubin: 0.3 mg/dL (ref 0.2–1.2)
Total Protein: 7.5 g/dL (ref 6.1–8.1)

## 2019-11-13 LAB — MICROALBUMIN / CREATININE URINE RATIO
Creatinine, Urine: 263 mg/dL (ref 20–275)
Microalb Creat Ratio: 33 mcg/mg creat — ABNORMAL HIGH (ref <30)
Microalb, Ur: 8.8 mg/dL

## 2019-11-13 LAB — LIPID PANEL
Cholesterol: 205 mg/dL — ABNORMAL HIGH (ref <200)
HDL: 39 mg/dL — ABNORMAL LOW (ref >=50)
LDL Cholesterol (Calc): 142 mg/dL (calc) — ABNORMAL HIGH
Non-HDL Cholesterol (Calc): 166 mg/dL (calc) — ABNORMAL HIGH (ref <130)
Total CHOL/HDL Ratio: 5.3 (calc) — ABNORMAL HIGH (ref <5.0)
Triglycerides: 119 mg/dL (ref <150)

## 2019-11-13 LAB — VITAMIN D 25 HYDROXY (VIT D DEFICIENCY, FRACTURES): Vit D, 25-Hydroxy: 15 ng/mL — ABNORMAL LOW (ref 30–100)

## 2019-11-15 ENCOUNTER — Other Ambulatory Visit: Payer: Self-pay | Admitting: Family Medicine

## 2019-11-15 MED ORDER — VITAMIN D (ERGOCALCIFEROL) 1.25 MG (50000 UNIT) PO CAPS: 50000.0000 [IU] | ORAL_CAPSULE | ORAL | 0 refills | Status: DC

## 2019-11-18 LAB — CYTOLOGY - PAP
Chlamydia: NEGATIVE
Comment: NEGATIVE
Comment: NEGATIVE
Comment: NEGATIVE
Comment: NORMAL
Diagnosis: HIGH — AB
High risk HPV: NEGATIVE
Neisseria Gonorrhea: NEGATIVE
Trichomonas: NEGATIVE

## 2019-11-20 ENCOUNTER — Telehealth: Payer: Self-pay

## 2019-11-20 ENCOUNTER — Encounter: Payer: Self-pay | Admitting: Family Medicine

## 2019-11-20 DIAGNOSIS — R87619 Unspecified abnormal cytological findings in specimens from cervix uteri: Secondary | ICD-10-CM

## 2019-11-20 DIAGNOSIS — Z124 Encounter for screening for malignant neoplasm of cervix: Secondary | ICD-10-CM

## 2019-11-20 NOTE — Telephone Encounter (Signed)
Pt notified, referral ordered

## 2019-12-01 ENCOUNTER — Other Ambulatory Visit: Payer: Self-pay | Admitting: Family Medicine

## 2019-12-01 MED ORDER — ROSUVASTATIN CALCIUM 10 MG PO TABS: 10.0000 mg | ORAL_TABLET | Freq: Every day | ORAL | 1 refills | Status: DC

## 2019-12-16 ENCOUNTER — Other Ambulatory Visit (HOSPITAL_COMMUNITY)
Admission: RE | Admit: 2019-12-16 | Discharge: 2019-12-16 | Disposition: A | Discharge status: Home / Self Care | Payer: 59 | Source: Ambulatory Visit | Attending: Obstetrics and Gynecology | Admitting: Obstetrics and Gynecology

## 2019-12-16 ENCOUNTER — Other Ambulatory Visit: Payer: Self-pay

## 2019-12-16 ENCOUNTER — Encounter: Payer: Self-pay | Admitting: Obstetrics and Gynecology

## 2019-12-16 ENCOUNTER — Ambulatory Visit (INDEPENDENT_AMBULATORY_CARE_PROVIDER_SITE_OTHER): Payer: 59 | Admitting: Obstetrics and Gynecology

## 2019-12-16 VITALS — BP 127/87 | HR 101 | Ht 65.5 in | Wt 312.5 lb

## 2019-12-16 DIAGNOSIS — R87611 Atypical squamous cells cannot exclude high grade squamous intraepithelial lesion on cytologic smear of cervix (ASC-H): Secondary | ICD-10-CM | POA: Insufficient documentation

## 2019-12-16 NOTE — Progress Notes (Signed)
    GYNECOLOGY CLINIC COLPOSCOPY PROCEDURE NOTE  44 y.o. G2P2 here for colposcopy for ASC cannot exclude high grade lesion [facility]) pap smear on 11/12/2019. Discussed role for HPV in cervical dysplasia, need for surveillance.  Patient given informed consent, signed copy in the chart, time out was performed.  Placed in lithotomy position. Cervix viewed with speculum and colposcope after application of acetic acid.   Colposcopy adequate? Yes  no mosaicism, no abnormal vasculature and punctation noted at 9 o'clock near cervical os; corresponding biopsies obtained.  ECC specimen obtained. All specimens were labeled and sent to pathology.  Patient was given post procedure instructions.  Will follow up pathology and manage accordingly; patient will be contacted with results and recommendations.  Routine preventative health maintenance measures emphasized.    [name], MD [facility]

## 2019-12-16 NOTE — Addendum Note (Signed)
Addended by: Edwyna Shell on: 12/16/2019 12:06 PM   Modules accepted: Orders

## 2019-12-16 NOTE — Patient Instructions (Signed)

## 2019-12-16 NOTE — Progress Notes (Signed)
Pt present for colposcopy. LMP 12/01/19. Pt stated that she is doing well no problems.

## 2019-12-17 LAB — SURGICAL PATHOLOGY

## 2020-02-02 ENCOUNTER — Other Ambulatory Visit: Payer: Self-pay | Admitting: Internal Medicine

## 2020-02-15 ENCOUNTER — Other Ambulatory Visit: Payer: Self-pay | Admitting: Internal Medicine

## 2020-02-20 ENCOUNTER — Other Ambulatory Visit: Payer: Self-pay | Admitting: Family Medicine

## 2020-02-20 DIAGNOSIS — J301 Allergic rhinitis due to pollen: Secondary | ICD-10-CM

## 2020-02-20 NOTE — Telephone Encounter (Signed)
Requested Prescriptions  Pending Prescriptions Disp Refills  . montelukast (SINGULAIR) 10 MG tablet [Pharmacy Med Name: MONTELUKAST SOD 10 MG TABLET] 90 tablet 0    Sig: TAKE 1 TABLET BY MOUTH EVERYDAY AT BEDTIME     Pulmonology:  Leukotriene Inhibitors Passed - 02/20/2020  9:40 AM      Passed - Valid encounter within last 12 months    Recent Outpatient Visits          3 months ago Type 2 diabetes mellitus with microalbuminuria, with long-term current use of insulin [facility])   [facility] [place], Drue Stager, MD   9 months ago Type 2 diabetes mellitus with microalbuminuria, with long-term current use of insulin [facility])   [facility] [place], Drue Stager, MD   9 months ago Type 2 diabetes mellitus with microalbuminuria, with long-term current use of insulin [facility])   [facility] [place], Drue Stager, MD   1 year ago Type 2 diabetes mellitus with microalbuminuria, with long-term current use of insulin [facility])   [facility] Steele Sizer, MD   1 year ago Cystitis without hematuria   [place], [place]      Future Appointments            In 2 months Steele Sizer, MD [facility], [facility]

## 2020-03-08 ENCOUNTER — Encounter: Payer: Self-pay | Admitting: Emergency Medicine

## 2020-03-08 ENCOUNTER — Emergency Department
Admission: EM | Admit: 2020-03-08 | Discharge: 2020-03-08 | Disposition: A | Discharge status: Home / Self Care | Payer: 59 | Attending: Emergency Medicine | Admitting: Emergency Medicine

## 2020-03-08 ENCOUNTER — Other Ambulatory Visit: Payer: Self-pay

## 2020-03-08 ENCOUNTER — Emergency Department: Payer: 59

## 2020-03-08 DIAGNOSIS — E119 Type 2 diabetes mellitus without complications: Secondary | ICD-10-CM | POA: Insufficient documentation

## 2020-03-08 DIAGNOSIS — Z794 Long term (current) use of insulin: Secondary | ICD-10-CM | POA: Diagnosis not present

## 2020-03-08 DIAGNOSIS — R079 Chest pain, unspecified: Secondary | ICD-10-CM

## 2020-03-08 DIAGNOSIS — G40909 Epilepsy, unspecified, not intractable, without status epilepticus: Secondary | ICD-10-CM | POA: Insufficient documentation

## 2020-03-08 DIAGNOSIS — Z87442 Personal history of urinary calculi: Secondary | ICD-10-CM | POA: Insufficient documentation

## 2020-03-08 LAB — CBC
HCT: 35.8 % — ABNORMAL LOW (ref 36.0–46.0)
Hemoglobin: 11.9 g/dL — ABNORMAL LOW (ref 12.0–15.0)
MCH: 25.8 pg — ABNORMAL LOW (ref 26.0–34.0)
MCHC: 33.2 g/dL (ref 30.0–36.0)
MCV: 77.7 fL — ABNORMAL LOW (ref 80.0–100.0)
Platelets: 321 10*3/uL (ref 150–400)
RBC: 4.61 MIL/uL (ref 3.87–5.11)
RDW: 14.4 % (ref 11.5–15.5)
WBC: 12.9 10*3/uL — ABNORMAL HIGH (ref 4.0–10.5)
nRBC: 0 % (ref 0.0–0.2)

## 2020-03-08 LAB — BASIC METABOLIC PANEL
Anion gap: 7 (ref 5–15)
BUN: 8 mg/dL (ref 6–20)
CO2: 23 mmol/L (ref 22–32)
Calcium: 8.8 mg/dL — ABNORMAL LOW (ref 8.9–10.3)
Chloride: 106 mmol/L (ref 98–111)
Creatinine, Ser: 0.68 mg/dL (ref 0.44–1.00)
GFR calc Af Amer: >60 mL/min (ref >60)
GFR calc non Af Amer: >60 mL/min (ref >60)
Glucose, Bld: 256 mg/dL — ABNORMAL HIGH (ref 70–99)
Potassium: 3.5 mmol/L (ref 3.5–5.1)
Sodium: 136 mmol/L (ref 135–145)

## 2020-03-08 LAB — TROPONIN I (HIGH SENSITIVITY)
Troponin I (High Sensitivity): 19 ng/L — ABNORMAL HIGH (ref <18)
Troponin I (High Sensitivity): 20 ng/L — ABNORMAL HIGH (ref <18)

## 2020-03-08 LAB — POCT PREGNANCY, URINE: Preg Test, Ur: NEGATIVE

## 2020-03-08 MED ORDER — IOHEXOL 350 MG/ML SOLN
75.0000 mL | Freq: Once | INTRAVENOUS | Status: AC | PRN
  Administered 2020-03-08: 75 mL via INTRAVENOUS
  Filled 2020-03-08: qty 75

## 2020-03-08 NOTE — ED Provider Notes (Signed)
[facility] Emergency Department Provider Note  Time seen: 2:50 PM  I have reviewed the triage vital signs and the nursing notes.   HISTORY  Chief Complaint Arm Pain and Chest Pain   HPI Erin Good is a 44 y.o. female with a past medical history of diabetes, epilepsy, presents to the emergency department for chest pain.  According to the patient for the past 2 weeks or so she has been experiencing very mild chest pain to the left chest worse with deep inspiration.  Denies any cough or fever.  Patient states her symptoms started after receiving the [place] Covid vaccine.  States occasional radiation of the pain into the left arm and shoulder.  Currently describes the pain as mild to moderate with a deep inspiration.   Past Medical History:  Diagnosis Date  . Allergic rhinitis   . Breast discharge 06/05/2017   2 weeks ago left  . Breast mass 12/06/2016   left  . Diabetes mellitus without complication ([place])   . Dyslipidemia   . Epilepsy ([place])   . Epilepsy ([place])   . Febrile seizures ([place])   . Galactorrhea   . Hx gestational diabetes   . Kidney stones   . Obesity   . Seizures ([place])   . Sleep apnea   . Syncope and collapse   . Tachycardia     Patient Active Problem List   Diagnosis Date Noted  . Vitamin D deficiency 01/31/2017  . Right shoulder tendinitis 01/31/2017  . Tendinosis 01/31/2017  . Bell's palsy 12/19/2016  . Tachycardia 09/30/2016  . Depression with anxiety 05/06/2015  . Allergic rhinitis 04/20/2015  . Anxiety and depression 04/20/2015  . 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 Gastro-esophageal reflux disease without esophagitis 04/20/2015  . Dysmetabolic syndrome 93/23/5573  . Extreme obesity 04/20/2015  . NASH (nonalcoholic steatohepatitis) 04/20/2015  . Allergy to nuts 04/20/2015  . Calculus of kidney 04/20/2015  . Type 2 diabetes mellitus with renal manifestations ([place]) 04/20/2015  . Central sleep apnea  11/26/2008  . Dyslipidemia 07/02/2008  . Leukocytosis 07/29/2007    Past Surgical History:  Procedure Laterality Date  . BREAST BIOPSY Left 2018   benign  . CESAREAN SECTION     X 2  . RIGHT OOPHORECTOMY Right 2001   benign tumor  . TUBAL LIGATION  2007    Prior to Admission medications   Medication Sig Start Date End Date Taking? Authorizing Provider  cholecalciferol (VITAMIN D) 1000 units tablet Take 1 tablet (1,000 Units total) by mouth daily. 12/18/17   Steele Sizer, MD  clonazePAM (KLONOPIN) 0.5 MG tablet Take 0.5 mg by mouth only at the onset of a seizure 08/14/16   [provider]  EPINEPHrine (EPIPEN 2-PAK) 0.3 mg/0.3 mL IJ SOAJ injection Inject 0.3 mg into the muscle once as needed (for anaphylaxis).  06/14/14   [provider]  fluticasone (FLONASE) 50 MCG/ACT nasal spray Place 2 sprays into both nostrils daily as needed. 01/28/18   [provider]  glucose blood (ONETOUCH VERIO) test strip Use 2x a day with OneTouch Verio Flex 07/03/19   Philemon Kingdom, MD  Insulin Glargine (LANTUS SOLOSTAR) 100 UNIT/ML Solostar Pen Inject 50 Units into the skin daily. 07/03/19   Philemon Kingdom, MD  Insulin Pen Needle 32G X 4 MM MISC Use 1x a day 07/03/19   Philemon Kingdom, MD  metFORMIN (GLUCOPHAGE XR) 750 MG 24 hr tablet Take 2 tablets (1,500 mg total) by mouth daily. 07/03/19  Philemon Kingdom, MD  montelukast (SINGULAIR) 10 MG tablet TAKE 1 TABLET BY MOUTH EVERYDAY AT BEDTIME 02/20/20   Steele Sizer, MD  OneTouch Delica Lancets 74Y MISC Use 2x a day with OneTouch Verio Flex 07/03/19   Philemon Kingdom, MD  OVER THE COUNTER MEDICATION Take 1 packet by mouth daily. The Nature Made Diabetes Health Pack (Multivitamin, Fish Oil, Vitamin D3, Magnesium,Vitamin C, Alpha Lipoic Acid, Chromium)    [provider]  OZEMPIC, 1 MG/DOSE, 2 MG/1.5ML SOPN INJECT 1 MG INTO THE SKIN ONCE A WEEK. 02/16/20   Philemon Kingdom, MD  rosuvastatin (CRESTOR) 10 MG tablet  Take 1 tablet (10 mg total) by mouth daily. 12/01/19   Steele Sizer, MD  Vitamin D, Ergocalciferol, (DRISDOL) 1.25 MG (50000 UT) CAPS capsule Take 1 capsule (50,000 Units total) by mouth every 7 (seven) days. 11/15/19   Steele Sizer, MD  zonisamide (ZONEGRAN) 100 MG capsule Take 400 mg by mouth at bedtime.     Roque Cash, MD    Allergies  Allergen Reactions  . Peanuts [Peanut Oil] Anaphylaxis  . Aspirin Other (See Comments)    Does take because of her epilepsy/seizure     Family History  Problem Relation Age of Onset  . Diabetes Mother   . Breast cancer Paternal Grandmother 84  . Cancer Paternal Grandmother   . Cancer Paternal Aunt   . Cancer Maternal Grandmother   . Heart disease Neg Hx     Social History Social History   Tobacco Use  . Smoking status: Never Smoker  . Smokeless tobacco: Never Used  Substance Use Topics  . Alcohol use: Yes    Comment: occass  . Drug use: No    Review of Systems Constitutional: Negative for fever. Cardiovascular: Left chest pain worse with deep inspiration Respiratory: Negative for shortness of breath. Gastrointestinal: Negative for abdominal pain Musculoskeletal: Negative for musculoskeletal complaints Skin: Negative for skin complaints  Neurological: Negative for headache All other ROS negative  ____________________________________________   PHYSICAL EXAM:  VITAL SIGNS: ED Triage Vitals [03/08/20 1404]  Enc Vitals Group     BP (!) 146/81     Pulse Rate (!) 103     Resp 16     Temp 98.7 F (37.1 C)     Temp Source Oral     SpO2 99 %     Weight (!) 312 lb (141.5 kg)     Height 5' 6"  (1.676 m)     Head Circumference      Peak Flow      Pain Score 7     Pain Loc      Pain Edu?      Excl. in [place]?     Constitutional: Alert and oriented. Well appearing and in no distress. Eyes: Normal exam ENT      Head: Normocephalic and atraumatic.      Mouth/Throat: Mucous membranes are moist. Cardiovascular: Normal rate,  regular rhythm. No murmur Respiratory: Normal respiratory effort without tachypnea nor retractions. Breath sounds are clear  Gastrointestinal: Soft and nontender. No distention.  Musculoskeletal: Nontender with normal range of motion in all extremities. No lower extremity tenderness or edema. Neurologic:  Normal speech and language. No gross focal neurologic deficits  Skin:  Skin is warm, dry and intact.  Psychiatric: Mood and affect are normal.   ____________________________________________    EKG  EKG viewed and interpreted by myself shows sinus tachycardia 105 bpm with a narrow QRS, normal axis, normal intervals, no concerning ST changes.  ____________________________________________    RADIOLOGY  Chest x-ray is negative  ____________________________________________   INITIAL IMPRESSION / ASSESSMENT AND PLAN / ED COURSE  Pertinent labs & imaging results that were available during my care of the patient were reviewed by me and considered in my medical decision making (see chart for details).   Patient presents to the emergency department with left chest pain worse with deep inspiration.  Differential would include ACS, PE, chest wall pain.  Patient's labs show a slightly elevated troponin at 19.  Patient is mildly tachycardic around 100.  Given the pleuritic nature of the pain and the slight troponin elevation we will repeat a second troponin as well as obtain a CTA of the chest to rule out pulmonary embolism.  Patient agreeable to plan of care.  Patient CTA is negative for acute abnormality.  Repeat troponin unchanged.  We will discharge with PCP follow-up.  Discussed my normal chest pain return precautions.  Patient agreeable to plan of care.  Erin Good was evaluated in Emergency Department on 03/08/2020 for the symptoms described in the history of present illness. She was evaluated in the context of the global COVID-19 pandemic, which necessitated consideration that  the patient might be at risk for infection with the SARS-CoV-2 virus that causes COVID-19. Institutional protocols and algorithms that pertain to the evaluation of patients at risk for COVID-19 are in a state of rapid change based on information released by regulatory bodies including the CDC and federal and state organizations. These policies and algorithms were followed during the patient's care in the ED.  ____________________________________________   FINAL CLINICAL IMPRESSION(S) / ED DIAGNOSES  Left chest pain   Harvest Dark, MD 03/08/20 1630

## 2020-03-08 NOTE — ED Triage Notes (Signed)
First Nurse Note:  C/O left shoulder pain radiating to left upper chest.  Patient is AAOx3.  Skin warm and dry. No SOB/ DOE.  NAD

## 2020-03-08 NOTE — ED Triage Notes (Signed)
Patient reports pain in left shoulder and down left arm for several weeks. States the pain started after she got her second COVID vaccine in that arm. Patient reports pain has also started to radiate into left chest. States she noticed she was more short of breath than normal.

## 2020-03-08 NOTE — ED Notes (Signed)
ED Provider at bedside. 

## 2020-03-25 ENCOUNTER — Other Ambulatory Visit: Payer: Self-pay | Admitting: Family Medicine

## 2020-03-25 DIAGNOSIS — Z1239 Encounter for other screening for malignant neoplasm of breast: Secondary | ICD-10-CM

## 2020-04-20 DIAGNOSIS — M7542 Impingement syndrome of left shoulder: Secondary | ICD-10-CM | POA: Insufficient documentation

## 2020-04-27 ENCOUNTER — Other Ambulatory Visit: Payer: Self-pay | Admitting: Sports Medicine

## 2020-04-27 DIAGNOSIS — M25512 Pain in left shoulder: Secondary | ICD-10-CM

## 2020-05-03 ENCOUNTER — Encounter: Payer: Self-pay | Admitting: Skilled Nursing Facility1

## 2020-05-03 ENCOUNTER — Other Ambulatory Visit: Payer: Self-pay

## 2020-05-03 ENCOUNTER — Encounter: Payer: 59 | Attending: General Surgery | Admitting: Skilled Nursing Facility1

## 2020-05-03 DIAGNOSIS — E669 Obesity, unspecified: Secondary | ICD-10-CM | POA: Diagnosis present

## 2020-05-03 NOTE — Progress Notes (Signed)
Nutrition Assessment for Bariatric Surgery Medical Nutrition Therapy  Patient was seen on 05/03/2020 for Pre-Operative Nutrition Assessment. Letter of approval faxed to [facility] Surgery bariatric surgery program coordinator on 05/03/2020.   Referral stated Supervised Weight Loss (SWL) visits needed: not listed  Planned surgery: Sleeve Gastrectomy  Pt expectation of surgery: to get healthier Pt expectation of dietitian: to educate    NUTRITION ASSESSMENT   Anthropometrics  Start weight at [facility]: 316.1 lbs (date: 05/03/2020)  Height: 67 in BMI: 49.51 kg/m2     Clinical  Medical hx: epilepsy, DM type 2 Medications: ozempic, metformin, lantus   Labs: A1C 8.0 Notable signs/symptoms: none reported  Any previous Deficiencies? No  Micronutrient Nutrition Focused Physical Exam: Hair: No issues observed Eyes: No issues observed Mouth: No issues observed Neck: No issues observed Nails: No issues observed Skin: No issues observed  Lifestyle & Dietary Hx  Pt states she has had diabetes for about 4 years. Pt states she thinks her A1C is 7.9. (in Epic 8). Pt states she does not wear her C-PAP every night.  Pt states she has stopped eating out and has been watching her portion sizes. Pt states she has understood a better place when to stop eating.  Pt states she checks her blood sugar every morning and every night and before lunch: fasting: 175-220; before lunch: 135; before bed: 150-160.  24-Hr Dietary Recall First Meal: spinach and cheese eggs + fruit Snack:  Second Meal: carrots and imitation crab Snack:  Third Meal: chicken + green beans + alfredo pasta  Snack:  Beverages: diet soda, gatorade, water   Estimated Energy Needs Calories: 1600 Carbohydrate: 180g Protein: 120g Fat: 44g   NUTRITION DIAGNOSIS  Overweight/obesity ([place]-3.3) related to past poor dietary habits and physical inactivity as evidenced by patient w/ planned sleeve gastrectomy surgery following  dietary guidelines for continued weight loss.    NUTRITION INTERVENTION  Nutrition counseling (C-1) and education (E-2) to facilitate bariatric surgery goals.   Pre-Op Goals Reviewed with the Patient . Track food and beverage intake (pen and paper, MyFitness Pal, Baritastic app, etc.) . Make healthy food choices while monitoring portion sizes . Consume 3 meals per day or try to eat every 3-5 hours . Avoid concentrated sugars and fried foods . Keep sugar & fat in the single digits per serving on food labels . Practice CHEWING your food (aim for applesauce consistency) . Practice not drinking 15 minutes before, during, and 30 minutes after each meal and snack . Avoid all carbonated beverages (ex: soda, sparkling beverages)  . Limit caffeinated beverages (ex: coffee, tea, energy drinks) . Avoid all sugar-sweetened beverages (ex: regular soda, sports drinks)  . Avoid alcohol  . Aim for 64-100 ounces of FLUID daily (with at least half of fluid intake being plain water)  . Aim for at least 60-80 grams of PROTEIN daily . Look for a liquid protein source that contains ?15 g protein and ?5 g carbohydrate (ex: shakes, drinks, shots) . Make a list of non-food related activities . Physical activity is an important part of a healthy lifestyle so keep it moving! The goal is to reach 150 minutes of exercise per week, including cardiovascular and weight baring activity.  *Goals that are bolded indicate the pt would like to start working towards these  Handouts Provided Include  . Bariatric Surgery handouts (Nutrition Visits, Pre-Op Goals, Protein Shakes, Vitamins & Minerals)  Learning Style & Readiness for Change Teaching method utilized: Visual & Auditory  Demonstrated degree of  understanding via: Teach Back  Barriers to learning/adherence to lifestyle change: none identified   RD's Notes for Next Visit . To discuss pre-op goals . To discuss food log . To review blood sugar numbers . To review  check list for meal thoughts      MONITORING & EVALUATION Dietary intake, weekly physical activity, body weight, and pre-op goals reached at next nutrition visit.    Next Steps  Patient is to follow up at [facility] for Pre-Op Class >2 weeks before surgery for further nutrition education.

## 2020-05-05 ENCOUNTER — Encounter: Payer: Self-pay | Admitting: Family Medicine

## 2020-05-13 ENCOUNTER — Ambulatory Visit: Payer: 59 | Admitting: Family Medicine

## 2020-05-20 ENCOUNTER — Ambulatory Visit: Payer: 59 | Admitting: Dietician

## 2020-05-25 ENCOUNTER — Other Ambulatory Visit: Payer: Self-pay

## 2020-05-27 ENCOUNTER — Other Ambulatory Visit: Payer: 59

## 2020-05-27 ENCOUNTER — Ambulatory Visit: Payer: Self-pay | Admitting: Internal Medicine

## 2020-05-30 ENCOUNTER — Encounter: Payer: Self-pay | Admitting: Family Medicine

## 2020-05-30 ENCOUNTER — Ambulatory Visit (INDEPENDENT_AMBULATORY_CARE_PROVIDER_SITE_OTHER): Payer: 59 | Admitting: Family Medicine

## 2020-05-30 ENCOUNTER — Other Ambulatory Visit: Payer: Self-pay

## 2020-05-30 ENCOUNTER — Other Ambulatory Visit: Payer: Self-pay | Admitting: Family Medicine

## 2020-05-30 VITALS — BP 136/80 | HR 110 | Temp 97.1°F | Resp 16 | Ht 65.5 in | Wt 320.0 lb

## 2020-05-30 DIAGNOSIS — R809 Proteinuria, unspecified: Secondary | ICD-10-CM

## 2020-05-30 DIAGNOSIS — E042 Nontoxic multinodular goiter: Secondary | ICD-10-CM | POA: Diagnosis not present

## 2020-05-30 DIAGNOSIS — E1129 Type 2 diabetes mellitus with other diabetic kidney complication: Secondary | ICD-10-CM | POA: Diagnosis not present

## 2020-05-30 DIAGNOSIS — Z794 Long term (current) use of insulin: Secondary | ICD-10-CM

## 2020-05-30 DIAGNOSIS — M75112 Incomplete rotator cuff tear or rupture of left shoulder, not specified as traumatic: Secondary | ICD-10-CM

## 2020-05-30 DIAGNOSIS — Z1159 Encounter for screening for other viral diseases: Secondary | ICD-10-CM

## 2020-05-30 DIAGNOSIS — E1169 Type 2 diabetes mellitus with other specified complication: Secondary | ICD-10-CM | POA: Diagnosis not present

## 2020-05-30 DIAGNOSIS — E785 Hyperlipidemia, unspecified: Secondary | ICD-10-CM | POA: Diagnosis not present

## 2020-05-30 DIAGNOSIS — D72829 Elevated white blood cell count, unspecified: Secondary | ICD-10-CM

## 2020-05-30 DIAGNOSIS — G4731 Primary central sleep apnea: Secondary | ICD-10-CM

## 2020-05-30 DIAGNOSIS — R000 Tachycardia, unspecified: Secondary | ICD-10-CM

## 2020-05-30 DIAGNOSIS — G40409 Other generalized epilepsy and epileptic syndromes, not intractable, without status epilepticus: Secondary | ICD-10-CM

## 2020-05-30 DIAGNOSIS — B379 Candidiasis, unspecified: Secondary | ICD-10-CM

## 2020-05-30 LAB — POCT GLYCOSYLATED HEMOGLOBIN (HGB A1C): Hemoglobin A1C: 8.3 % — AB (ref 4.0–5.6)

## 2020-05-30 MED ORDER — FLUCONAZOLE 150 MG PO TABS: 150.0000 mg | ORAL_TABLET | ORAL | 0 refills | Status: DC

## 2020-05-30 MED ORDER — ATENOLOL 25 MG PO TABS: 25.0000 mg | ORAL_TABLET | Freq: Every day | ORAL | 0 refills | Status: DC

## 2020-05-30 MED ORDER — DAPAGLIFLOZIN PROPANEDIOL 10 MG PO TABS: 10.0000 mg | ORAL_TABLET | Freq: Every day | ORAL | 2 refills | Status: DC

## 2020-05-30 MED ORDER — ROSUVASTATIN CALCIUM 10 MG PO TABS: 10.0000 mg | ORAL_TABLET | Freq: Every day | ORAL | 1 refills | Status: DC

## 2020-05-30 NOTE — Progress Notes (Signed)
Name: Erin Good   MRN: [id number]    DOB: 05-Mar-1976   Date:05/30/2020       Progress Note  Subjective  Chief Complaint  Chief Complaint  Patient presents with  . Surgical Clearance    She has severe should pain and needs to have surgery.    HPI  Left shoulder pain: she states symptoms started around April 2021 after COVID-19 vaccine, she states she does recall slipping in her shower before the vaccine. She went to [facility] but PT was not helping, she switched to [employer] and had MRI and was diagnosed with rotator cuff tear. She came in for pre-op clearance. A1C is 8.3 %, needs to be below 7.5 %.    Morbid obesity: she is seeing a nutritionist and plans on having bariatric surgery in October , she is monitor her intake, portion control, her weight is not going down, she states she continues to gain weight.   DMII: she was seeing endocrinologist Dr. Cruzita Lederer , but last visit was Dec 2020. She states she has been compliant with her medications and denies side effects. No polyphagia, polydipsia or polyuria. Glucose at home has been around 178 fasting, after meals it goes down to 120. Usually higher in am's. Eye exam is due . We will add Jardiance today, we will also give her diflucan . Discussed a low carbohydrate diet around 100 g per day   Leucocytosis: we will recheck level, she has seen Dr. Janese Banks back in 2018 and was given reassurance   Tachycardia: chronic with possible surgery in one month  Patient Active Problem List   Diagnosis Date Noted  . Vitamin D deficiency 01/31/2017  . Right shoulder tendinitis 01/31/2017  . Tendinosis 01/31/2017  . Bell's palsy 12/19/2016  . Tachycardia 09/30/2016  . Depression with anxiety 05/06/2015  . Allergic rhinitis 04/20/2015  . Anxiety and depression 04/20/2015  . 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 Gastro-esophageal reflux disease without esophagitis 04/20/2015  . Dysmetabolic syndrome 30/07/2329  . Extreme  obesity 04/20/2015  . NASH (nonalcoholic steatohepatitis) 04/20/2015  . Allergy to nuts 04/20/2015  . Calculus of kidney 04/20/2015  . Type 2 diabetes mellitus with renal manifestations ([place]) 04/20/2015  . Central sleep apnea 11/26/2008  . Dyslipidemia 07/02/2008  . Leukocytosis 07/29/2007    Past Surgical History:  Procedure Laterality Date  . BREAST BIOPSY Left 2018   benign  . CESAREAN SECTION     X 2  . RIGHT OOPHORECTOMY Right 2001   benign tumor  . TUBAL LIGATION  2007    Family History  Problem Relation Age of Onset  . Diabetes Mother   . Breast cancer Paternal Grandmother 36  . Cancer Paternal Grandmother   . Cancer Paternal Aunt   . Cancer Maternal Grandmother   . Heart disease Neg Hx     Social History   Tobacco Use  . Smoking status: Never Smoker  . Smokeless tobacco: Never Used  Substance Use Topics  . Alcohol use: Yes    Comment: occass     Current Outpatient Medications:  .  clonazePAM (KLONOPIN) 0.5 MG tablet, Take 0.5 mg by mouth only at the onset of a seizure, Disp: , Rfl: 3 .  EPINEPHrine (EPIPEN 2-PAK) 0.3 mg/0.3 mL IJ SOAJ injection, Inject 0.3 mg into the muscle once as needed (for anaphylaxis). , Disp: , Rfl:  .  fluticasone (FLONASE) 50 MCG/ACT nasal spray, Place 2 sprays into both nostrils daily as needed., Disp: ,  Rfl: 12 .  glucose blood (ONETOUCH VERIO) test strip, Use 2x a day with OneTouch Verio Flex, Disp: 200 each, Rfl: 3 .  Insulin Glargine (LANTUS SOLOSTAR) 100 UNIT/ML Solostar Pen, Inject 50 Units into the skin daily., Disp: 10 pen, Rfl: 5 .  Insulin Pen Needle 32G X 4 MM MISC, Use 1x a day, Disp: 100 each, Rfl: 3 .  metFORMIN (GLUCOPHAGE XR) 750 MG 24 hr tablet, Take 2 tablets (1,500 mg total) by mouth daily., Disp: 180 tablet, Rfl: 3 .  montelukast (SINGULAIR) 10 MG tablet, TAKE 1 TABLET BY MOUTH EVERYDAY AT BEDTIME, Disp: 90 tablet, Rfl: 0 .  OneTouch Delica Lancets 32K MISC, Use 2x a day with OneTouch Verio Flex, Disp: 200 each,  Rfl: 3 .  OVER THE COUNTER MEDICATION, Take 1 packet by mouth daily. The Nature Made Diabetes Health Pack (Multivitamin, Fish Oil, Vitamin D3, Magnesium,Vitamin C, Alpha Lipoic Acid, Chromium), Disp: , Rfl:  .  OZEMPIC, 1 MG/DOSE, 2 MG/1.5ML SOPN, INJECT 1 MG INTO THE SKIN ONCE A WEEK., Disp: 3 pen, Rfl: 5 .  rosuvastatin (CRESTOR) 10 MG tablet, Take 1 tablet (10 mg total) by mouth daily., Disp: 90 tablet, Rfl: 1 .  Vitamin D, Ergocalciferol, (DRISDOL) 1.25 MG (50000 UT) CAPS capsule, Take 1 capsule (50,000 Units total) by mouth every 7 (seven) days., Disp: 12 capsule, Rfl: 0 .  zonisamide (ZONEGRAN) 100 MG capsule, Take 400 mg by mouth at bedtime. , Disp: , Rfl:  .  atenolol (TENORMIN) 25 MG tablet, Take 1 tablet (25 mg total) by mouth daily., Disp: 30 tablet, Rfl: 0 .  dapagliflozin propanediol (FARXIGA) 10 MG TABS tablet, Take 1 tablet (10 mg total) by mouth daily before breakfast., Disp: 30 tablet, Rfl: 2 .  fluconazole (DIFLUCAN) 150 MG tablet, Take 1 tablet (150 mg total) by mouth once a week., Disp: 12 tablet, Rfl: 0 .  traMADol (ULTRAM) 50 MG tablet, tramadol 50 mg tablet  Take 1 tablet every 6 hours by oral route as needed., Disp: , Rfl:   Allergies  Allergen Reactions  . Peanuts [Peanut Oil] Anaphylaxis  . Aspirin Other (See Comments)    Does take because of her epilepsy/seizure     I personally reviewed active problem list, medication list, allergies, family history, social history, health maintenance with the patient/caregiver today.   ROS  Constitutional: Negative for fever , positive for weight change.  Respiratory: Negative for cough and shortness of breath.   Cardiovascular: Negative for chest pain or palpitations.  Gastrointestinal: Negative for abdominal pain, no bowel changes.  Musculoskeletal: Negative for gait problem or joint swelling.  Skin: Negative for rash.  Neurological: Negative for dizziness or headache.  No other specific complaints in a complete review of  systems (except as listed in HPI above).  Objective  Vitals:   05/30/20 1530  BP: (!) 136/80  Pulse: (!) 110  Resp: 16  Temp: (!) 97.1 F (36.2 C)  TempSrc: Temporal  SpO2: 99%  Weight: (!) 320 lb (145.2 kg)  Height: 5' 5.5" (1.664 m)    Body mass index is 52.44 kg/m.  Physical Exam  Constitutional: Patient appears well-developed and well-nourished. Obese No distress.  HEENT: head atraumatic, normocephalic, pupils equal and reactive to light, neck supple Cardiovascular: Normal rate, regular rhythm and normal heart sounds.  No murmur heard. No BLE edema. Pulmonary/Chest: Effort normal and breath sounds normal. No respiratory distress. Abdominal: Soft.  There is no tenderness. Psychiatric: Patient has a normal mood and affect. behavior is  normal. Judgment and thought content normal.   Recent Results (from the past 2160 hour(s))  Basic metabolic panel     Status: Abnormal   Collection Time: 03/08/20  2:05 PM  Result Value Ref Range   Sodium 136 135 - 145 mmol/L   Potassium 3.5 3.5 - 5.1 mmol/L   Chloride 106 98 - 111 mmol/L   CO2 23 22 - 32 mmol/L   Glucose, Bld 256 (H) 70 - 99 mg/dL    Comment: Glucose reference range applies only to samples taken after fasting for at least 8 hours.   BUN 8 6 - 20 mg/dL   Creatinine, Ser 0.68 0.44 - 1.00 mg/dL   Calcium 8.8 (L) 8.9 - 10.3 mg/dL   GFR calc non Af Amer >60 >60 mL/min   GFR calc Af Amer >60 >60 mL/min   Anion gap 7 5 - 15    Comment: Performed at [facility], [address] [place], [place] [address]  CBC     Status: Abnormal   Collection Time: 03/08/20  2:05 PM  Result Value Ref Range   WBC 12.9 (H) 4.0 - 10.5 K/uL   RBC 4.61 3.87 - 5.11 MIL/uL   Hemoglobin 11.9 (L) 12.0 - 15.0 g/dL   HCT 35.8 (L) 36 - 46 %   MCV 77.7 (L) 80.0 - 100.0 fL   MCH 25.8 (L) 26.0 - 34.0 pg   MCHC 33.2 30.0 - 36.0 g/dL   RDW 14.4 11.5 - 15.5 %   Platelets 321 150 - 400 K/uL   nRBC 0.0 0.0 - 0.2 %    Comment: Performed at  [facility], [place], [place] [address]  Troponin I (High Sensitivity)     Status: Abnormal   Collection Time: 03/08/20  2:05 PM  Result Value Ref Range   Troponin I (High Sensitivity) 19 (H) <18 ng/L    Comment: (NOTE) Elevated high sensitivity troponin I (hsTnI) values and significant  changes across serial measurements may suggest ACS but many other  chronic and acute conditions are known to elevate hsTnI results.  Refer to the "Links" section for chest pain algorithms and additional  guidance. Performed at [facility], [address] [place], [place] [address]   Pregnancy, urine POC     Status: None   Collection Time: 03/08/20  2:08 PM  Result Value Ref Range   Preg Test, Ur NEGATIVE NEGATIVE    Comment:        THE SENSITIVITY OF THIS METHODOLOGY IS >24 mIU/mL   Troponin I (High Sensitivity)     Status: Abnormal   Collection Time: 03/08/20  3:55 PM  Result Value Ref Range   Troponin I (High Sensitivity) 20 (H) <18 ng/L    Comment: (NOTE) Elevated high sensitivity troponin I (hsTnI) values and significant  changes across serial measurements may suggest ACS but many other  chronic and acute conditions are known to elevate hsTnI results.  Refer to the "Links" section for chest pain algorithms and additional  guidance. Performed at [facility], [address] [place], [place] [address]   POCT HgB A1C     Status: Abnormal   Collection Time: 05/30/20  4:00 PM  Result Value Ref Range   Hemoglobin A1C 8.3 (A) 4.0 - 5.6 %   HbA1c POC (<> result, manual entry)     HbA1c, POC (prediabetic range)     HbA1c, POC (controlled diabetic range)      Diabetic Foot  Exam: Diabetic Foot Exam - Simple   Simple Foot Form Diabetic Foot exam was performed with the following findings: Yes 05/30/2020  4:06 PM  Visual Inspection No deformities, no ulcerations, no other skin breakdown bilaterally: Yes Sensation Testing Intact to touch and  monofilament testing bilaterally: Yes Pulse Check Posterior Tibialis and Dorsalis pulse intact bilaterally: Yes Comments      PHQ2/9: Depression screen [facility] 2/9 05/30/2020 11/12/2019 05/25/2019 05/13/2019 09/17/2018  Decreased Interest 0 0 0 0 0  Down, Depressed, Hopeless 0 0 0 0 0  PHQ - 2 Score 0 0 0 0 0  Altered sleeping 0 0 0 0 0  Tired, decreased energy 0 0 2 1 0  Change in appetite 0 0 0 0 0  Feeling bad or failure about yourself  0 0 0 0 0  Trouble concentrating 0 0 0 0 0  Moving slowly or fidgety/restless 0 0 0 0 0  Suicidal thoughts 0 0 0 0 0  PHQ-9 Score 0 0 2 1 0  Difficult doing work/chores - - Not difficult at all Not difficult at all Not difficult at all    phq 9 is negative   Fall Risk: Fall Risk  05/30/2020 05/03/2020 11/12/2019 05/25/2019 05/13/2019  Falls in the past year? 0 0 0 0 0  Number falls in past yr: 0 - 0 0 0  Injury with Fall? 0 - 0 0 0  Comment - - - - -     Assessment & Plan  1. Dyslipidemia associated with type 2 diabetes mellitus (HCC)  - rosuvastatin (CRESTOR) 10 MG tablet; Take 1 tablet (10 mg total) by mouth daily.  Dispense: 90 tablet; Refill: 1 - POCT HgB A1C - Lipid panel - COMPLETE METABOLIC PANEL WITH GFR - dapagliflozin propanediol (FARXIGA) 10 MG TABS tablet; Take 1 tablet (10 mg total) by mouth daily before breakfast.  Dispense: 30 tablet; Refill: 2  2. Multiple thyroid nodules  Needs [place] yearly   3. Morbid obesity ([place])  Seeing dietician, plans on having bariatric surgery this Fall   4. Type 2 diabetes mellitus with microalbuminuria, with long-term current use of insulin (HCC)  - POCT HgB A1C Discussed following up with endo   5. Central sleep apnea  Needs to increase compliance   6. 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 Incomplete tear of left rotator cuff, unspecified whether traumatic  Return in one month for fructosamine check, cannot clear her yet since A1C is 8.3 % today   8. Need for hepatitis  C screening test  - Hepatitis C antibody  9. Leukocytosis, unspecified type  - CBC with Differential/Platelet  10. Tachycardia  - atenolol (TENORMIN) 25 MG tablet; Take 1 tablet (25 mg total) by mouth daily.  Dispense: 30 tablet; Refill: 0  11. Yeast infection  - fluconazole (DIFLUCAN) 150 MG tablet; Take 1 tablet (150 mg total) by mouth once a week.  Dispense: 12 tablet; Refill: 0

## 2020-06-13 ENCOUNTER — Other Ambulatory Visit: Payer: Self-pay | Admitting: Family Medicine

## 2020-06-13 NOTE — Telephone Encounter (Signed)
Requested medication (s) are due for refill today: no  Requested medication (s) are on the active medication list: yes  Last refill:  05/21/2020  Future visit scheduled: yes  Notes to clinic: this refill cannot be delegated    Requested Prescriptions  Pending Prescriptions Disp Refills   Vitamin D, Ergocalciferol, (DRISDOL) 1.25 MG (50000 UNIT) CAPS capsule [Pharmacy Med Name: VITAMIN D2 1.25MG(50,000 UNIT)] 4 capsule 2    Sig: Take 1 capsule (50,000 Units total) by mouth every 7 (seven) days.      Endocrinology:  Vitamins - Vitamin D Supplementation Failed - 06/13/2020  1:45 AM      Failed - 50,000 IU strengths are not delegated      Failed - Ca in normal range and within 360 days    Calcium  Date Value Ref Range Status  03/08/2020 8.8 (L) 8.9 - 10.3 mg/dL Final   Calcium, Ion  Date Value Ref Range Status  12/01/2016 1.28 1.15 - 1.40 mmol/L Final          Failed - Phosphate in normal range and within 360 days    No results found for: PHOS        Failed - Vitamin D in normal range and within 360 days    Vit D, 25-Hydroxy  Date Value Ref Range Status  11/12/2019 15 (L) 30 - 100 ng/mL Final    Comment:    Vitamin D Status         25-OH Vitamin D: . Deficiency:                    <20 ng/mL Insufficiency:             20 - 29 ng/mL Optimal:                 > or = 30 ng/mL . For 25-OH Vitamin D testing on patients on  D2-supplementation and patients for whom quantitation  of D2 and D3 fractions is required, the [employer](TM) 25-OH VIT D, (D2,D3), LC/MS/MS is recommended: order  code [id number] (patients >41yr). See Note 1 . Note 1 . For additional information, please refer to  http://education.Quest[facility].com/faq/FAQ199  (This link is being provided for informational/ educational purposes only.)           Passed - Valid encounter within last 12 months    Recent Outpatient Visits           2 weeks ago Dyslipidemia associated with type 2 diabetes mellitus  ([facility]   7 months ago Type 2 diabetes mellitus with microalbuminuria, with long-term current use of insulin ([facility]   [facility]   1 year ago Type 2 diabetes mellitus with microalbuminuria, with long-term current use of insulin ([facility]   [facility]   1 year ago Type 2 diabetes mellitus with microalbuminuria, with long-term current use of insulin ([facility]   [facility]   1 year ago Type 2 diabetes mellitus with microalbuminuria, with long-term current use of insulin ([facility]   [facility]       Future Appointments             In 2 weeks [facility]tee[facility]izer [facility]

## 2020-06-22 ENCOUNTER — Other Ambulatory Visit: Payer: Self-pay | Admitting: Family Medicine

## 2020-06-22 DIAGNOSIS — R000 Tachycardia, unspecified: Secondary | ICD-10-CM

## 2020-06-22 NOTE — Telephone Encounter (Signed)
Requested Prescriptions  Pending Prescriptions Disp Refills  . atenolol (TENORMIN) 25 MG tablet [Pharmacy Med Name: ATENOLOL 25 MG TABLET] 30 tablet 0    Sig: TAKE 1 TABLET BY MOUTH EVERY DAY     Cardiovascular:  Beta Blockers Passed - 06/22/2020  1:19 AM      Passed - Last BP in normal range    BP Readings from Last 1 Encounters:  05/30/20 (!) 136/80         Passed - Last Heart Rate in normal range    Pulse Readings from Last 1 Encounters:  05/30/20 (!) 110         Passed - Valid encounter within last 6 months    Recent Outpatient Visits          3 weeks ago Dyslipidemia associated with type 2 diabetes mellitus [facility])   [facility] [place], Drue Stager, MD   7 months ago Type 2 diabetes mellitus with microalbuminuria, with long-term current use of insulin [facility])   [facility] [place], Drue Stager, MD   1 year ago Type 2 diabetes mellitus with microalbuminuria, with long-term current use of insulin [facility])   [facility] [place], Drue Stager, MD   1 year ago Type 2 diabetes mellitus with microalbuminuria, with long-term current use of insulin [facility])   [facility] [place], Drue Stager, MD   1 year ago Type 2 diabetes mellitus with microalbuminuria, with long-term current use of insulin [facility])   [facility] Steele Sizer, MD      Future Appointments            In 1 week Steele Sizer, MD [facility], [facility]

## 2020-06-29 NOTE — Progress Notes (Signed)
Name: Erin Good   MRN: [id number]    DOB: January 08, 1976   Date:06/30/2020       Progress Note  Subjective  Chief Complaint  Chief Complaint  Patient presents with  . Diabetes    Follow up on A1c before surgery.    HPI  Left shoulder pain: she states symptoms started around April 2021 after COVID-19 vaccine, she states she does recall slipping in her shower before the vaccine. She went to [facility] but PT was not helping, she switched to [employer] and had MRI and was diagnosed with rotator cuff tear. She came in for pre-op clearance back in July but A1C was 8.3 %, needs to be below 7.5 %, she came in today to make sure she can have surgery.   Palpitation: she states over the past few weeks she has been feeling tired, palpitation that is worse at night   DMII: she was seeing endocrinologist Dr. Cruzita Lederer , but last visit was Dec 2020. She states she has been compliant with her medications and denies side effects. No polyphagia, polydipsia or polyuria. Glucose at home has been around 180-185  fasting, after meals it goes down to 130's . Usually higher in mornings.  She is taking Metformin, Lantus 50 units, Ozempic and we added [place] on her last visit in July. She states she eats dinner around 6 pm, she states she does not snack before bed, she takes lantus after breakfast. Discussed checking glucose in the middle of the night for a few days   Leucocytosis: we will recheck level, she has seen Dr. Janese Banks back in 2018 and was given reassurance we will recheck level prior to surgery   Tachycardia: chronic but having surgery soon, we started on beta blocker but now having worsening of symptoms, heart flutters / palpitation at night and feeling more fatigued than usual , discussed referral to cardiologist . She has some intermittent left side chest pain from her shoulder, not associated sob or diaphoresis   Patient Active Problem List   Diagnosis Date Noted  . Vitamin D  deficiency 01/31/2017  . Right shoulder tendinitis 01/31/2017  . Tendinosis 01/31/2017  . Bell's palsy 12/19/2016  . Tachycardia 09/30/2016  . Depression with anxiety 05/06/2015  . Allergic rhinitis 04/20/2015  . Anxiety and depression 04/20/2015  . 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 Gastro-esophageal reflux disease without esophagitis 04/20/2015  . Dysmetabolic syndrome 15/72/6203  . Extreme obesity 04/20/2015  . NASH (nonalcoholic steatohepatitis) 04/20/2015  . Allergy to nuts 04/20/2015  . Calculus of kidney 04/20/2015  . Type 2 diabetes mellitus with renal manifestations ([place]) 04/20/2015  . Central sleep apnea 11/26/2008  . Dyslipidemia 07/02/2008  . Leukocytosis 07/29/2007    Past Surgical History:  Procedure Laterality Date  . BREAST BIOPSY Left 2018   benign  . CESAREAN SECTION     X 2  . RIGHT OOPHORECTOMY Right 2001   benign tumor  . TUBAL LIGATION  2007    Family History  Problem Relation Age of Onset  . Diabetes Mother   . Breast cancer Paternal Grandmother 25  . Cancer Paternal Grandmother   . Cancer Paternal Aunt   . Cancer Maternal Grandmother   . Heart disease Neg Hx     Social History   Tobacco Use  . Smoking status: Never Smoker  . Smokeless tobacco: Never Used  Substance Use Topics  . Alcohol use: Yes    Comment: occass     Current Outpatient  Medications:  .  atenolol (TENORMIN) 25 MG tablet, TAKE 1 TABLET BY MOUTH EVERY DAY, Disp: 30 tablet, Rfl: 0 .  clonazePAM (KLONOPIN) 0.5 MG tablet, Take 0.5 mg by mouth only at the onset of a seizure, Disp: , Rfl: 3 .  Continuous Blood Gluc Sensor (FREESTYLE LIBRE 2 SENSOR) MISC, 1 each by Does not apply route every 14 (fourteen) days., Disp: 2 each, Rfl: 5 .  dapagliflozin propanediol (FARXIGA) 10 MG TABS tablet, Take 1 tablet (10 mg total) by mouth daily before breakfast., Disp: 30 tablet, Rfl: 2 .  EPINEPHrine (EPIPEN 2-PAK) 0.3 mg/0.3 mL IJ SOAJ injection, Inject 0.3 mg into the muscle  once as needed (for anaphylaxis). , Disp: , Rfl:  .  fluconazole (DIFLUCAN) 150 MG tablet, Take 1 tablet (150 mg total) by mouth once a week., Disp: 12 tablet, Rfl: 0 .  fluticasone (FLONASE) 50 MCG/ACT nasal spray, Place 2 sprays into both nostrils daily as needed., Disp: , Rfl: 12 .  glucose blood (ONETOUCH VERIO) test strip, Use 2x a day with OneTouch Verio Flex, Disp: 200 each, Rfl: 3 .  Insulin Glargine (LANTUS SOLOSTAR) 100 UNIT/ML Solostar Pen, Inject 50 Units into the skin daily., Disp: 10 pen, Rfl: 5 .  Insulin Pen Needle 32G X 4 MM MISC, Use 1x a day, Disp: 100 each, Rfl: 3 .  metFORMIN (GLUCOPHAGE XR) 750 MG 24 hr tablet, Take 2 tablets (1,500 mg total) by mouth daily., Disp: 180 tablet, Rfl: 3 .  montelukast (SINGULAIR) 10 MG tablet, TAKE 1 TABLET BY MOUTH EVERYDAY AT BEDTIME, Disp: 90 tablet, Rfl: 0 .  OneTouch Delica Lancets 96E MISC, Use 2x a day with OneTouch Verio Flex, Disp: 200 each, Rfl: 3 .  OVER THE COUNTER MEDICATION, Take 1 packet by mouth daily. The Nature Made Diabetes Health Pack (Multivitamin, Fish Oil, Vitamin D3, Magnesium,Vitamin C, Alpha Lipoic Acid, Chromium), Disp: , Rfl:  .  OZEMPIC, 1 MG/DOSE, 2 MG/1.5ML SOPN, INJECT 1 MG INTO THE SKIN ONCE A WEEK., Disp: 3 pen, Rfl: 5 .  rosuvastatin (CRESTOR) 10 MG tablet, Take 1 tablet (10 mg total) by mouth daily., Disp: 90 tablet, Rfl: 1 .  traMADol (ULTRAM) 50 MG tablet, tramadol 50 mg tablet  Take 1 tablet every 6 hours by oral route as needed., Disp: , Rfl:  .  Vitamin D, Ergocalciferol, (DRISDOL) 1.25 MG (50000 UNIT) CAPS capsule, TAKE 1 CAPSULE (50,000 UNITS TOTAL) BY MOUTH EVERY 7 (SEVEN) DAYS., Disp: 4 capsule, Rfl: 2 .  zonisamide (ZONEGRAN) 100 MG capsule, Take 400 mg by mouth at bedtime. , Disp: , Rfl:   Allergies  Allergen Reactions  . Peanuts [Peanut Oil] Anaphylaxis  . Aspirin Other (See Comments)    Does take because of her epilepsy/seizure     I personally reviewed active problem list, medication list,  allergies, family history, social history, health maintenance with the patient/caregiver today.   ROS  Ten systems reviewed and is negative except as mentioned in HPI   Objective   Vitals:   06/30/20 0853  BP: 130/90  Pulse: (!) 110  Resp: 16  Temp: 97.7 F (36.5 C)  TempSrc: Oral  SpO2: 99%  Weight: (!) 321 lb 9.6 oz (145.9 kg)  Height: 5' 5.5" (1.664 m)    Body mass index is 52.7 kg/m.  Physical Exam  Constitutional: Patient appears well-developed and well-nourished. Obese No distress.  HEENT: head atraumatic, normocephalic, pupils equal and reactive to light,  neck supple Cardiovascular: Tachycardia  regular rhythm and normal  heart sounds.  No murmur heard. No BLE edema. Pulmonary/Chest: Effort normal and breath sounds normal. No respiratory distress. Abdominal: Soft.  There is no tenderness. Muscular skeletal: wearing a sling on left arm, pain during rom of left shoulder Psychiatric: Patient has a normal mood and affect. behavior is normal. Judgment and thought content normal.  Recent Results (from the past 2160 hour(s))  POCT HgB A1C     Status: Abnormal   Collection Time: 05/30/20  4:00 PM  Result Value Ref Range   Hemoglobin A1C 8.3 (A) 4.0 - 5.6 %   HbA1c POC (<> result, manual entry)     HbA1c, POC (prediabetic range)     HbA1c, POC (controlled diabetic range)       PHQ2/9: Depression screen [facility] 2/9 06/30/2020 05/30/2020 11/12/2019 05/25/2019 05/13/2019  Decreased Interest 0 0 0 0 0  Down, Depressed, Hopeless 0 0 0 0 0  PHQ - 2 Score 0 0 0 0 0  Altered sleeping 0 0 0 0 0  Tired, decreased energy 0 0 0 2 1  Change in appetite 0 0 0 0 0  Feeling bad or failure about yourself  0 0 0 0 0  Trouble concentrating 0 0 0 0 0  Moving slowly or fidgety/restless 0 0 0 0 0  Suicidal thoughts 0 0 0 0 0  PHQ-9 Score 0 0 0 2 1  Difficult doing work/chores - - - Not difficult at all Not difficult at all  Some recent data might be hidden    phq 9 is negative  Fall  Risk: Fall Risk  06/30/2020 05/30/2020 05/03/2020 11/12/2019 05/25/2019  Falls in the past year? 1 0 0 0 0  Number falls in past yr: 0 0 - 0 0  Injury with Fall? 1 0 - 0 0  Comment - - - - -     Assessment & Plan  1. Type 2 diabetes mellitus with microalbuminuria, with long-term current use of insulin (HCC)  - Fructosamine - Hemoglobin A1c - Continuous Blood Gluc Sensor (FREESTYLE LIBRE 2 SENSOR) MISC; 1 each by Does not apply route every 14 (fourteen) days.  Dispense: 2 each; Refill: 5  Discussed importance of checking glucose during the night to make sure not having hypoglycemia and somogyi effect  2. Palpitation  - Ambulatory referral to Cardiology   3. Incomplete tear of left rotator cuff, unspecified whether traumatic

## 2020-06-30 ENCOUNTER — Ambulatory Visit (INDEPENDENT_AMBULATORY_CARE_PROVIDER_SITE_OTHER): Payer: 59 | Admitting: Family Medicine

## 2020-06-30 ENCOUNTER — Other Ambulatory Visit: Payer: Self-pay

## 2020-06-30 ENCOUNTER — Encounter: Payer: Self-pay | Admitting: Family Medicine

## 2020-06-30 VITALS — BP 130/90 | HR 110 | Temp 97.7°F | Resp 16 | Ht 65.5 in | Wt 321.6 lb

## 2020-06-30 DIAGNOSIS — E1129 Type 2 diabetes mellitus with other diabetic kidney complication: Secondary | ICD-10-CM

## 2020-06-30 DIAGNOSIS — R809 Proteinuria, unspecified: Secondary | ICD-10-CM

## 2020-06-30 DIAGNOSIS — M75112 Incomplete rotator cuff tear or rupture of left shoulder, not specified as traumatic: Secondary | ICD-10-CM

## 2020-06-30 DIAGNOSIS — R002 Palpitations: Secondary | ICD-10-CM | POA: Diagnosis not present

## 2020-06-30 DIAGNOSIS — Z794 Long term (current) use of insulin: Secondary | ICD-10-CM

## 2020-06-30 MED ORDER — FREESTYLE LIBRE 2 SENSOR MISC: 1.0000 | 5 refills | Status: DC

## 2020-07-01 ENCOUNTER — Ambulatory Visit (INDEPENDENT_AMBULATORY_CARE_PROVIDER_SITE_OTHER): Payer: 59 | Admitting: Cardiology

## 2020-07-01 ENCOUNTER — Encounter: Payer: Self-pay | Admitting: Cardiology

## 2020-07-01 VITALS — BP 110/78 | HR 95 | Ht 65.5 in | Wt 322.0 lb

## 2020-07-01 DIAGNOSIS — R002 Palpitations: Secondary | ICD-10-CM | POA: Diagnosis not present

## 2020-07-01 DIAGNOSIS — E7800 Pure hypercholesterolemia, unspecified: Secondary | ICD-10-CM

## 2020-07-01 NOTE — Progress Notes (Signed)
Cardiology Office Note:    Date:  07/01/2020   ID:  Erin Good, DOB [birth year], MRN [id number]  PCP:  Steele Sizer, MD  [facility] Cardiologist:  Kate Sable, MD  [place] Electrophysiologist:  None   Referring MD: Steele Sizer, MD   Chief Complaint  Patient presents with  . New Patient (Initial Visit)    Per patient she is here today to establish care due to recent palpitations after receiving COVID vaccine. Medications verbally reviewed with patient.    Erin Good is a 44 y.o. female who is being seen today for the evaluation of palpitations at the request of Steele Sizer, MD.   History of Present Illness:    Erin Good is a 44 y.o. female with a hx of hyperlipidemia, diabetes, seizures, obesity, who presents due to palpitations.  Patient states having symptoms of palpitations over the past 4 months.  Symptoms started after she had her Covid vaccine.  She describes symptoms as irregular/fast heartbeats occurring roughly 2-3 times a week, lasting less than 5 minutes.  Denies any chest pain, shortness of breath at rest or with exertion.  Denies any dizziness, presyncope or syncope.  She is not sure why she takes atenolol.  Takes Klonopin due to seizure disorder.  She denies any history of heart disease, denies smoking or drinking.  She has left shoulder bursitis and possible rotator cuff surgery being planned.  Also thinking of getting with reduction surgery in the future.  Past Medical History:  Diagnosis Date  . Allergic rhinitis   . Breast discharge 06/05/2017   2 weeks ago left  . Breast mass 12/06/2016   left  . Diabetes mellitus without complication ([place])   . Dyslipidemia   . Epilepsy ([place])   . Epilepsy ([place])   . Febrile seizures ([place])   . Galactorrhea   . Hx gestational diabetes   . Kidney stones   . Obesity   . Seizures ([place])   . Sleep apnea   . Syncope and collapse   . Tachycardia     Past Surgical  History:  Procedure Laterality Date  . BREAST BIOPSY Left 2018   benign  . CESAREAN SECTION     X 2  . RIGHT OOPHORECTOMY Right 2001   benign tumor  . TUBAL LIGATION  2007    Current Medications: Current Meds  Medication Sig  . atenolol (TENORMIN) 25 MG tablet TAKE 1 TABLET BY MOUTH EVERY DAY  . celecoxib (CELEBREX) 200 MG capsule Take 200 mg by mouth 2 (two) times daily.  . clonazePAM (KLONOPIN) 0.5 MG tablet Take 0.5 mg by mouth only at the onset of a seizure  . Continuous Blood Gluc Sensor (FREESTYLE LIBRE 2 SENSOR) MISC 1 each by Does not apply route every 14 (fourteen) days.  . dapagliflozin propanediol (FARXIGA) 10 MG TABS tablet Take 1 tablet (10 mg total) by mouth daily before breakfast.  . EPINEPHrine (EPIPEN 2-PAK) 0.3 mg/0.3 mL IJ SOAJ injection Inject 0.3 mg into the muscle once as needed (for anaphylaxis).   . fluconazole (DIFLUCAN) 150 MG tablet Take 1 tablet (150 mg total) by mouth once a week.  . fluticasone (FLONASE) 50 MCG/ACT nasal spray Place 2 sprays into both nostrils daily as needed.  Marland Kitchen glucose blood (ONETOUCH VERIO) test strip Use 2x a day with OneTouch Verio Flex  . Insulin Glargine (LANTUS SOLOSTAR) 100 UNIT/ML Solostar Pen Inject 50 Units into the skin daily.  . Insulin Pen Needle 32G X 4 MM  MISC Use 1x a day  . MAPAP 500 MG capsule Take 2 capsules by mouth every 8 (eight) hours.  . meloxicam (MOBIC) 15 MG tablet Take 15 mg by mouth 3 (three) times daily.  . metFORMIN (GLUCOPHAGE XR) 750 MG 24 hr tablet Take 2 tablets (1,500 mg total) by mouth daily.  . montelukast (SINGULAIR) 10 MG tablet TAKE 1 TABLET BY MOUTH EVERYDAY AT BEDTIME  . OneTouch Delica Lancets 35T MISC Use 2x a day with OneTouch Verio Flex  . OVER THE COUNTER MEDICATION Take 1 packet by mouth daily. The Nature Made Diabetes Health Pack (Multivitamin, Fish Oil, Vitamin D3, Magnesium,Vitamin C, Alpha Lipoic Acid, Chromium)  . OZEMPIC, 1 MG/DOSE, 2 MG/1.5ML SOPN INJECT 1 MG INTO THE SKIN ONCE A  WEEK.  . rosuvastatin (CRESTOR) 10 MG tablet Take 1 tablet (10 mg total) by mouth daily.  Marland Kitchen tiZANidine (ZANAFLEX) 2 MG tablet Take 2 mg by mouth at bedtime as needed.  . traMADol (ULTRAM) 50 MG tablet tramadol 50 mg tablet  Take 1 tablet every 6 hours by oral route as needed.  . Vitamin D, Ergocalciferol, (DRISDOL) 1.25 MG (50000 UNIT) CAPS capsule TAKE 1 CAPSULE (50,000 UNITS TOTAL) BY MOUTH EVERY 7 (SEVEN) DAYS.  Marland Kitchen zonisamide (ZONEGRAN) 100 MG capsule Take 400 mg by mouth at bedtime.      Allergies:   Peanuts [peanut oil] and Aspirin   Social History   Socioeconomic History  . Marital status: Married    Spouse name: Roderic Palau  . Number of children: 2  . Years of education: College  . Highest education level: Not on file  Occupational History  . Occupation: Optometrist  Tobacco Use  . Smoking status: Never Smoker  . Smokeless tobacco: Never Used  Vaping Use  . Vaping Use: Never used  Substance and Sexual Activity  . Alcohol use: Yes    Comment: occass  . Drug use: No  . Sexual activity: Yes    Partners: Male    Birth control/protection: Surgical  Other Topics Concern  . Not on file  Social History Narrative  . Not on file   Social Determinants of Health   Financial Resource Strain: Low Risk   . Difficulty of Paying Living Expenses: Not hard at all  Food Insecurity: No Food Insecurity  . Worried About Charity fundraiser in the Last Year: Never true  . Ran Out of Food in the Last Year: Never true  Transportation Needs: No Transportation Needs  . Lack of Transportation (Medical): No  . Lack of Transportation (Non-Medical): No  Physical Activity:   . Days of Exercise per Week: Not on file  . Minutes of Exercise per Session: Not on file  Stress: No Stress Concern Present  . Feeling of Stress : Not at all  Social Connections: Moderately Isolated  . Frequency of Communication with Friends and Family: More than three times a week  . Frequency of Social Gatherings with  Friends and Family: More than three times a week  . Attends Religious Services: Never  . Active Member of Clubs or Organizations: No  . Attends Archivist Meetings: Never  . Marital Status: Married     Family History: The patient's family history includes Breast cancer (age of onset: 54) in her paternal grandmother; Cancer in her maternal grandmother, paternal aunt, and paternal grandmother; Diabetes in her mother. There is no history of Heart disease.  ROS:   Please see the history of present illness.  All other systems reviewed and are negative.  EKGs/Labs/Other Studies Reviewed:    The following studies were reviewed today:   EKG:  EKG is  ordered today.  The ekg ordered today demonstrates normal sinus rhythm, normal ECG.  Recent Labs: 06/30/2020: ALT 20; BUN 12; Creat 0.66; Hemoglobin 12.4; Platelets 343; Potassium 4.1; Sodium 135  Recent Lipid Panel    Component Value Date/Time   CHOL 204 (H) 06/30/2020 0900   TRIG 118 06/30/2020 0900   HDL 46 (L) 06/30/2020 0900   CHOLHDL 4.4 06/30/2020 0900   VLDL 21 06/13/2017 0927   LDLCALC 135 (H) 06/30/2020 0900    Physical Exam:    VS:  BP 110/78 (BP Location: Right Arm, Patient Position: Sitting, Cuff Size: Large)   Pulse 95   Ht 5' 5.5" (1.664 m)   Wt (!) 322 lb (146.1 kg)   SpO2 98%   BMI 52.77 kg/m     Wt Readings from Last 3 Encounters:  07/01/20 (!) 322 lb (146.1 kg)  06/30/20 (!) 321 lb 9.6 oz (145.9 kg)  05/30/20 (!) 320 lb (145.2 kg)     GEN:  Well nourished, well developed in no acute distress, obese HEENT: Normal NECK: No JVD; No carotid bruits LYMPHATICS: No lymphadenopathy CARDIAC: RRR, no murmurs, rubs, gallops RESPIRATORY:  Clear to auscultation without rales, wheezing or rhonchi  ABDOMEN: Soft, non-tender, distended MUSCULOSKELETAL:  No edema; No deformity  SKIN: Warm and dry NEUROLOGIC:  Alert and oriented x 3 PSYCHIATRIC:  Normal affect   ASSESSMENT:    1. Palpitations   2.  Morbid obesity ([place])   3. Pure hypercholesterolemia    PLAN:    In order of problems listed above:  1. Patient with history of palpitations over the past 4 months.  Symptoms occur about 2 times weekly.  Will evaluate for any significant arrhythmias with a cardiac monitor x2 weeks. 2. Patient is morbidly obese, low-calorie diet, weight loss advised. 3. History of hyperlipidemia, on statin.  Follow up after cardiac monitor.   Medication Adjustments/Labs and Tests Ordered: Current medicines are reviewed at length with the patient today.  Concerns regarding medicines are outlined above.  Orders Placed This Encounter  Procedures  . LONG TERM MONITOR (3-14 DAYS)   No orders of the defined types were placed in this encounter.   Patient Instructions  Medication Instructions:  Your physician recommends that you continue on your current medications as directed. Please refer to the Current Medication list given to you today.   *If you need a refill on your cardiac medications before your next appointment, please call your pharmacy*   Lab Work: None Ordered If you have labs (blood work) drawn today and your tests are completely normal, you will receive your results only by: Marland Kitchen MyChart Message (if you have MyChart) OR . A paper copy in the mail If you have any lab test that is abnormal or we need to change your treatment, we will call you to review the results.   Testing/Procedures:  Your physician has recommended that you wear a Zio monitor for 2 weeks.  This monitor is a medical device that records the heart's electrical activity. Doctors most often use these monitors to diagnose arrhythmias. Arrhythmias are problems with the speed or rhythm of the heartbeat. The monitor is a small device applied to your chest. You can wear one while you do your normal daily activities. While wearing this monitor if you have any symptoms to push the button and record what  you felt. Once you have worn  this monitor for the period of time provider prescribed (Usually 14 days), you will return the monitor device in the postage paid box. Once it is returned they will download the data collected and provide [place] with a report which the provider will then review and we will call you with those results. Important tips:  1. Avoid showering during the first 24 hours of wearing the monitor. 2. Avoid excessive sweating to help maximize wear time. 3. Do not submerge the device, no hot tubs, and no swimming pools. 4. Keep any lotions or oils away from the patch. 5. After 24 hours you may shower with the patch on. Take brief showers with your back facing the shower head.  6. Do not remove patch once it has been placed because that will interrupt data and decrease adhesive wear time. 7. Push the button when you have any symptoms and write down what you were feeling. 8. Once you have completed wearing your monitor, remove and place into box which has postage paid and place in your outgoing mailbox.  9. If for some reason you have misplaced your box then call our office and we can provide another box and/or mail it off for you.         Follow-Up: At [facility], you and your health needs are our priority.  As part of our continuing mission to provide you with exceptional heart care, we have created designated Provider Care Teams.  These Care Teams include your primary Cardiologist (physician) and Advanced Practice Providers (APPs -  Physician Assistants and Nurse Practitioners) who all work together to provide you with the care you need, when you need it.  We recommend signing up for the patient portal called "MyChart".  Sign up information is provided on this After Visit Summary.  MyChart is used to connect with patients for Virtual Visits (Telemedicine).  Patients are able to view lab/test results, encounter notes, upcoming appointments, etc.  Non-urgent messages can be sent to your provider as well.     To learn more about what you can do with MyChart, go to [email or web address].    Your next appointment:   5 week(s)  The format for your next appointment:   In Person  Provider:   Kate Sable, MD   Other Instructions      Signed, Kate Sable, MD  07/01/2020 5:26 PM    [place]

## 2020-07-01 NOTE — Patient Instructions (Signed)
Medication Instructions:  Your physician recommends that you continue on your current medications as directed. Please refer to the Current Medication list given to you today.  *If you need a refill on your cardiac medications before your next appointment, please call your pharmacy*   Lab Work: None Ordered If you have labs (blood work) drawn today and your tests are completely normal, you will receive your results only by: . MyChart Message (if you have MyChart) OR . A paper copy in the mail If you have any lab test that is abnormal or we need to change your treatment, we will call you to review the results.   Testing/Procedures:  Your physician has recommended that you wear a Zio monitor for 2 weeks. This monitor is a medical device that records the heart's electrical activity. Doctors most often use these monitors to diagnose arrhythmias. Arrhythmias are problems with the speed or rhythm of the heartbeat. The monitor is a small device applied to your chest. You can wear one while you do your normal daily activities. While wearing this monitor if you have any symptoms to push the button and record what you felt. Once you have worn this monitor for the period of time provider prescribed (Usually 14 days), you will return the monitor device in the postage paid box. Once it is returned they will download the data collected and provide us with a report which the provider will then review and we will call you with those results. Important tips:  1. Avoid showering during the first 24 hours of wearing the monitor. 2. Avoid excessive sweating to help maximize wear time. 3. Do not submerge the device, no hot tubs, and no swimming pools. 4. Keep any lotions or oils away from the patch. 5. After 24 hours you may shower with the patch on. Take brief showers with your back facing the shower head.  6. Do not remove patch once it has been placed because that will interrupt data and decrease adhesive wear  time. 7. Push the button when you have any symptoms and write down what you were feeling. 8. Once you have completed wearing your monitor, remove and place into box which has postage paid and place in your outgoing mailbox.  9. If for some reason you have misplaced your box then call our office and we can provide another box and/or mail it off for you.         Follow-Up: At [facility], you and your health needs are our priority.  As part of our continuing mission to provide you with exceptional heart care, we have created designated Provider Care Teams.  These Care Teams include your primary Cardiologist (physician) and Advanced Practice Providers (APPs -  Physician Assistants and Nurse Practitioners) who all work together to provide you with the care you need, when you need it.  We recommend signing up for the patient portal called "MyChart".  Sign up information is provided on this After Visit Summary.  MyChart is used to connect with patients for Virtual Visits (Telemedicine).  Patients are able to view lab/test results, encounter notes, upcoming appointments, etc.  Non-urgent messages can be sent to your provider as well.   To learn more about what you can do with MyChart, go to [email or web address].    Your next appointment:   5 week(s)  The format for your next appointment:   In Person  Provider:   Brian Agbor-Etang, MD   Other Instructions   

## 2020-07-03 ENCOUNTER — Encounter: Payer: Self-pay | Admitting: Family Medicine

## 2020-07-04 ENCOUNTER — Other Ambulatory Visit: Payer: Self-pay | Admitting: Family Medicine

## 2020-07-04 DIAGNOSIS — E785 Hyperlipidemia, unspecified: Secondary | ICD-10-CM

## 2020-07-04 LAB — COMPLETE METABOLIC PANEL WITH GFR
AG Ratio: 1.3 (calc) (ref 1.0–2.5)
ALT: 20 U/L (ref 6–29)
AST: 14 U/L (ref 10–30)
Albumin: 4.1 g/dL (ref 3.6–5.1)
Alkaline phosphatase (APISO): 83 U/L (ref 31–125)
BUN: 12 mg/dL (ref 7–25)
CO2: 23 mmol/L (ref 20–32)
Calcium: 9.1 mg/dL (ref 8.6–10.2)
Chloride: 103 mmol/L (ref 98–110)
Creat: 0.66 mg/dL (ref 0.50–1.10)
GFR, Est African American: 126 mL/min/{1.73_m2} (ref >=60)
GFR, Est Non African American: 109 mL/min/{1.73_m2} (ref >=60)
Globulin: 3.2 g/dL (calc) (ref 1.9–3.7)
Glucose, Bld: 214 mg/dL — ABNORMAL HIGH (ref 65–99)
Potassium: 4.1 mmol/L (ref 3.5–5.3)
Sodium: 135 mmol/L (ref 135–146)
Total Bilirubin: 0.3 mg/dL (ref 0.2–1.2)
Total Protein: 7.3 g/dL (ref 6.1–8.1)

## 2020-07-04 LAB — CBC WITH DIFFERENTIAL/PLATELET
Absolute Monocytes: 638 cells/uL (ref 200–950)
Basophils Absolute: 44 cells/uL (ref 0–200)
Basophils Relative: 0.3 %
Eosinophils Absolute: 15 cells/uL (ref 15–500)
Eosinophils Relative: 0.1 %
HCT: 38 % (ref 35.0–45.0)
Hemoglobin: 12.4 g/dL (ref 11.7–15.5)
Lymphs Abs: 3132 cells/uL (ref 850–3900)
MCH: 25.9 pg — ABNORMAL LOW (ref 27.0–33.0)
MCHC: 32.6 g/dL (ref 32.0–36.0)
MCV: 79.5 fL — ABNORMAL LOW (ref 80.0–100.0)
MPV: 11.6 fL (ref 7.5–12.5)
Monocytes Relative: 4.4 %
Neutro Abs: 10672 cells/uL — ABNORMAL HIGH (ref 1500–7800)
Neutrophils Relative %: 73.6 %
Platelets: 343 10*3/uL (ref 140–400)
RBC: 4.78 10*6/uL (ref 3.80–5.10)
RDW: 14 % (ref 11.0–15.0)
Total Lymphocyte: 21.6 %
WBC: 14.5 10*3/uL — ABNORMAL HIGH (ref 3.8–10.8)

## 2020-07-04 LAB — FRUCTOSAMINE: Fructosamine: 272 umol/L (ref 205–285)

## 2020-07-04 LAB — HEMOGLOBIN A1C
Hgb A1c MFr Bld: 9 % of total Hgb — ABNORMAL HIGH (ref <5.7)
Mean Plasma Glucose: 212 (calc)
eAG (mmol/L): 11.7 (calc)

## 2020-07-04 LAB — HEPATITIS C ANTIBODY
Hepatitis C Ab: NONREACTIVE
SIGNAL TO CUT-OFF: 0.02 (ref <1.00)

## 2020-07-04 LAB — LIPID PANEL
Cholesterol: 204 mg/dL — ABNORMAL HIGH (ref <200)
HDL: 46 mg/dL — ABNORMAL LOW (ref >=50)
LDL Cholesterol (Calc): 135 mg/dL (calc) — ABNORMAL HIGH
Non-HDL Cholesterol (Calc): 158 mg/dL (calc) — ABNORMAL HIGH (ref <130)
Total CHOL/HDL Ratio: 4.4 (calc) (ref <5.0)
Triglycerides: 118 mg/dL (ref <150)

## 2020-07-04 NOTE — Addendum Note (Signed)
Addended by: Ronaldo Miyamoto on: 07/04/2020 04:29 PM   Modules accepted: Orders

## 2020-07-12 ENCOUNTER — Emergency Department (HOSPITAL_COMMUNITY)
Admission: EM | Admit: 2020-07-12 | Discharge: 2020-07-12 | Disposition: A | Discharge status: Home / Self Care | Payer: 59 | Attending: Emergency Medicine | Admitting: Emergency Medicine

## 2020-07-12 ENCOUNTER — Encounter (HOSPITAL_COMMUNITY): Payer: Self-pay | Admitting: Emergency Medicine

## 2020-07-12 DIAGNOSIS — R112 Nausea with vomiting, unspecified: Secondary | ICD-10-CM | POA: Insufficient documentation

## 2020-07-12 DIAGNOSIS — Z5321 Procedure and treatment not carried out due to patient leaving prior to being seen by health care provider: Secondary | ICD-10-CM | POA: Diagnosis not present

## 2020-07-12 DIAGNOSIS — R1030 Lower abdominal pain, unspecified: Secondary | ICD-10-CM | POA: Diagnosis not present

## 2020-07-12 DIAGNOSIS — M549 Dorsalgia, unspecified: Secondary | ICD-10-CM | POA: Diagnosis not present

## 2020-07-12 LAB — CBC
HCT: 39.1 % (ref 36.0–46.0)
Hemoglobin: 12.1 g/dL (ref 12.0–15.0)
MCH: 25.1 pg — ABNORMAL LOW (ref 26.0–34.0)
MCHC: 30.9 g/dL (ref 30.0–36.0)
MCV: 81.1 fL (ref 80.0–100.0)
Platelets: 377 10*3/uL (ref 150–400)
RBC: 4.82 MIL/uL (ref 3.87–5.11)
RDW: 13.8 % (ref 11.5–15.5)
WBC: 17 10*3/uL — ABNORMAL HIGH (ref 4.0–10.5)
nRBC: 0 % (ref 0.0–0.2)

## 2020-07-12 LAB — COMPREHENSIVE METABOLIC PANEL
ALT: 22 U/L (ref 0–44)
AST: 16 U/L (ref 15–41)
Albumin: 3.6 g/dL (ref 3.5–5.0)
Alkaline Phosphatase: 85 U/L (ref 38–126)
Anion gap: 11 (ref 5–15)
BUN: 7 mg/dL (ref 6–20)
CO2: 23 mmol/L (ref 22–32)
Calcium: 9.2 mg/dL (ref 8.9–10.3)
Chloride: 102 mmol/L (ref 98–111)
Creatinine, Ser: 0.64 mg/dL (ref 0.44–1.00)
GFR calc Af Amer: >60 mL/min (ref >60)
GFR calc non Af Amer: >60 mL/min (ref >60)
Glucose, Bld: 201 mg/dL — ABNORMAL HIGH (ref 70–99)
Potassium: 3.5 mmol/L (ref 3.5–5.1)
Sodium: 136 mmol/L (ref 135–145)
Total Bilirubin: 0.3 mg/dL (ref 0.3–1.2)
Total Protein: 7.8 g/dL (ref 6.5–8.1)

## 2020-07-12 LAB — URINALYSIS, ROUTINE W REFLEX MICROSCOPIC
Bilirubin Urine: NEGATIVE
Glucose, UA: NEGATIVE mg/dL
Ketones, ur: 5 mg/dL — AB
Nitrite: NEGATIVE
Protein, ur: 30 mg/dL — AB
Specific Gravity, Urine: 1.019 (ref 1.005–1.030)
pH: 6 (ref 5.0–8.0)

## 2020-07-12 LAB — LIPASE, BLOOD: Lipase: 48 U/L (ref 11–51)

## 2020-07-12 LAB — I-STAT BETA HCG BLOOD, ED (MC, WL, AP ONLY): I-stat hCG, quantitative: <5 m[IU]/mL (ref <5)

## 2020-07-12 MED ORDER — ONDANSETRON 4 MG PO TBDP
4.0000 mg | ORAL_TABLET | Freq: Once | ORAL | Status: AC | PRN
  Administered 2020-07-12: 4 mg via ORAL

## 2020-07-12 NOTE — ED Notes (Signed)
Pt states that she is leaving because she has to go home and take her medications

## 2020-07-12 NOTE — ED Triage Notes (Signed)
Pt endorses lower abd cramping and back pain since 3 am. Reports a hx of ruptured cysts that required sx in 2001. Endorses N/V.

## 2020-07-13 ENCOUNTER — Encounter: Payer: Self-pay | Admitting: Family Medicine

## 2020-07-13 ENCOUNTER — Encounter: Payer: Self-pay | Admitting: Internal Medicine

## 2020-07-13 ENCOUNTER — Other Ambulatory Visit: Payer: Self-pay | Admitting: Internal Medicine

## 2020-07-13 ENCOUNTER — Other Ambulatory Visit: Payer: Self-pay

## 2020-07-13 ENCOUNTER — Telehealth: Payer: Self-pay

## 2020-07-13 ENCOUNTER — Encounter: Payer: Self-pay | Admitting: Intensive Care

## 2020-07-13 ENCOUNTER — Telehealth (INDEPENDENT_AMBULATORY_CARE_PROVIDER_SITE_OTHER): Payer: 59 | Admitting: Internal Medicine

## 2020-07-13 ENCOUNTER — Emergency Department
Admission: EM | Admit: 2020-07-13 | Discharge: 2020-07-13 | Disposition: A | Discharge status: Home / Self Care | Payer: 59 | Attending: Emergency Medicine | Admitting: Emergency Medicine

## 2020-07-13 ENCOUNTER — Telehealth: Payer: Self-pay | Admitting: Obstetrics and Gynecology

## 2020-07-13 ENCOUNTER — Emergency Department: Payer: 59

## 2020-07-13 DIAGNOSIS — E119 Type 2 diabetes mellitus without complications: Secondary | ICD-10-CM | POA: Insufficient documentation

## 2020-07-13 DIAGNOSIS — R102 Pelvic and perineal pain: Secondary | ICD-10-CM | POA: Insufficient documentation

## 2020-07-13 DIAGNOSIS — R824 Acetonuria: Secondary | ICD-10-CM

## 2020-07-13 DIAGNOSIS — Z91010 Allergy to peanuts: Secondary | ICD-10-CM | POA: Insufficient documentation

## 2020-07-13 DIAGNOSIS — R739 Hyperglycemia, unspecified: Secondary | ICD-10-CM | POA: Diagnosis not present

## 2020-07-13 DIAGNOSIS — R112 Nausea with vomiting, unspecified: Secondary | ICD-10-CM | POA: Diagnosis not present

## 2020-07-13 DIAGNOSIS — R1030 Lower abdominal pain, unspecified: Secondary | ICD-10-CM | POA: Diagnosis not present

## 2020-07-13 DIAGNOSIS — Z20822 Contact with and (suspected) exposure to covid-19: Secondary | ICD-10-CM | POA: Diagnosis not present

## 2020-07-13 DIAGNOSIS — E1129 Type 2 diabetes mellitus with other diabetic kidney complication: Secondary | ICD-10-CM

## 2020-07-13 DIAGNOSIS — Z794 Long term (current) use of insulin: Secondary | ICD-10-CM | POA: Insufficient documentation

## 2020-07-13 DIAGNOSIS — N946 Dysmenorrhea, unspecified: Secondary | ICD-10-CM | POA: Insufficient documentation

## 2020-07-13 DIAGNOSIS — R809 Proteinuria, unspecified: Secondary | ICD-10-CM

## 2020-07-13 DIAGNOSIS — D72829 Elevated white blood cell count, unspecified: Secondary | ICD-10-CM

## 2020-07-13 LAB — COMPREHENSIVE METABOLIC PANEL
ALT: 23 U/L (ref 0–44)
AST: 22 U/L (ref 15–41)
Albumin: 4 g/dL (ref 3.5–5.0)
Alkaline Phosphatase: 84 U/L (ref 38–126)
Anion gap: 11 (ref 5–15)
BUN: 6 mg/dL (ref 6–20)
CO2: 22 mmol/L (ref 22–32)
Calcium: 9 mg/dL (ref 8.9–10.3)
Chloride: 101 mmol/L (ref 98–111)
Creatinine, Ser: 0.67 mg/dL (ref 0.44–1.00)
GFR calc Af Amer: >60 mL/min (ref >60)
GFR calc non Af Amer: >60 mL/min (ref >60)
Glucose, Bld: 236 mg/dL — ABNORMAL HIGH (ref 70–99)
Potassium: 3.7 mmol/L (ref 3.5–5.1)
Sodium: 134 mmol/L — ABNORMAL LOW (ref 135–145)
Total Bilirubin: 0.6 mg/dL (ref 0.3–1.2)
Total Protein: 8.6 g/dL — ABNORMAL HIGH (ref 6.5–8.1)

## 2020-07-13 LAB — URINALYSIS, COMPLETE (UACMP) WITH MICROSCOPIC
Bilirubin Urine: NEGATIVE
Glucose, UA: 50 mg/dL — AB
Ketones, ur: NEGATIVE mg/dL
Leukocytes,Ua: NEGATIVE
Nitrite: NEGATIVE
Protein, ur: NEGATIVE mg/dL
Specific Gravity, Urine: 1.015 (ref 1.005–1.030)
pH: 7 (ref 5.0–8.0)

## 2020-07-13 LAB — CBC
HCT: 38.8 % (ref 36.0–46.0)
Hemoglobin: 12.8 g/dL (ref 12.0–15.0)
MCH: 26 pg (ref 26.0–34.0)
MCHC: 33 g/dL (ref 30.0–36.0)
MCV: 78.7 fL — ABNORMAL LOW (ref 80.0–100.0)
Platelets: 394 10*3/uL (ref 150–400)
RBC: 4.93 MIL/uL (ref 3.87–5.11)
RDW: 13.8 % (ref 11.5–15.5)
WBC: 17.7 10*3/uL — ABNORMAL HIGH (ref 4.0–10.5)
nRBC: 0 % (ref 0.0–0.2)

## 2020-07-13 LAB — LIPASE, BLOOD: Lipase: 28 U/L (ref 11–51)

## 2020-07-13 LAB — HCG, QUANTITATIVE, PREGNANCY: hCG, Beta Chain, Quant, S: 1 m[IU]/mL (ref <5)

## 2020-07-13 LAB — SARS CORONAVIRUS 2 BY RT PCR (HOSPITAL ORDER, PERFORMED IN ~~LOC~~ HOSPITAL LAB): SARS Coronavirus 2: NEGATIVE

## 2020-07-13 MED ORDER — OXYCODONE-ACETAMINOPHEN 5-325 MG PO TABS
1.0000 | ORAL_TABLET | ORAL | Status: DC | PRN
  Administered 2020-07-13: 1 via ORAL
  Filled 2020-07-13: qty 1

## 2020-07-13 MED ORDER — SODIUM CHLORIDE 0.9 % IV BOLUS
1000.0000 mL | Freq: Once | INTRAVENOUS | Status: AC
  Administered 2020-07-13: 1000 mL via INTRAVENOUS

## 2020-07-13 MED ORDER — ONDANSETRON 4 MG PO TBDP: 4.0000 mg | ORAL_TABLET | Freq: Three times a day (TID) | ORAL | 0 refills | Status: DC | PRN

## 2020-07-13 MED ORDER — APAP-PAMABROM-PYRILAMINE 500-25-15 MG PO TABS: 2.0000 | ORAL_TABLET | Freq: Three times a day (TID) | ORAL | 0 refills | Status: DC | PRN

## 2020-07-13 NOTE — ED Provider Notes (Signed)
[facility] Emergency Department Provider Note  ____________________________________________  Time seen: Approximately 4:20 PM  I have reviewed the triage vital signs and the nursing notes.   HISTORY  Chief Complaint Pelvic Pain and Abdominal Pain    HPI Erin Good is a 44 y.o. female who presents the emergency department complaining of pelvic cramping, atypical menstrual cycle.  Patient states that 2 days ago she started her menstrual cycle when she expected it.  Patient states that she started to have some cramping which she typically associates with passing of clots.  Instead of passing clots, patient has stopped having any vaginal bleeding.  Patient states that the cramping is still there.  She called her primary care and was referred to the emergency department for imaging and possible fluids for possible dehydration.  She states that with the cramping she has developed some nausea, vomiting, diarrhea.  No fevers or chills.  No hematic emesis.  No blood in her stools.  Patient states that the cramping is typical for her menstrual cycle, it is just atypical that she only had 1 day of vaginal bleeding.         Past Medical History:  Diagnosis Date  . Allergic rhinitis   . Breast discharge 06/05/2017   2 weeks ago left  . Breast mass 12/06/2016   left  . Diabetes mellitus without complication ([place])   . Dyslipidemia   . Epilepsy ([place])   . Epilepsy ([place])   . Febrile seizures ([place])   . Galactorrhea   . Hx gestational diabetes   . Kidney stones   . Obesity   . Seizures ([place])   . Sleep apnea   . Syncope and collapse   . Tachycardia     Patient Active Problem List   Diagnosis Date Noted  . Vitamin D deficiency 01/31/2017  . Right shoulder tendinitis 01/31/2017  . Tendinosis 01/31/2017  . Bell's palsy 12/19/2016  . Tachycardia 09/30/2016  . Depression with anxiety 05/06/2015  . Allergic rhinitis 04/20/2015  . Anxiety and depression  04/20/2015  . 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 Gastro-esophageal reflux disease without esophagitis 04/20/2015  . Dysmetabolic syndrome 67/20/9470  . Extreme obesity 04/20/2015  . NASH (nonalcoholic steatohepatitis) 04/20/2015  . Allergy to nuts 04/20/2015  . Calculus of kidney 04/20/2015  . Type 2 diabetes mellitus with renal manifestations ([place]) 04/20/2015  . Central sleep apnea 11/26/2008  . Dyslipidemia 07/02/2008  . Leukocytosis 07/29/2007    Past Surgical History:  Procedure Laterality Date  . BREAST BIOPSY Left 2018   benign  . CESAREAN SECTION     X 2  . RIGHT OOPHORECTOMY Right 2001   benign tumor  . TUBAL LIGATION  2007    Prior to Admission medications   Medication Sig Start Date End Date Taking? Authorizing Provider  APAP-Pamabrom-Pyrilamine 500-25-15 MG TABS Take 2 tablets by mouth 3 (three) times daily as needed (Pelvic pain). 07/13/20   Aliha Diedrich, Roderic Palau D, PA-C  atenolol (TENORMIN) 25 MG tablet TAKE 1 TABLET BY MOUTH EVERY DAY 06/22/20   Steele Sizer, MD  celecoxib (CELEBREX) 200 MG capsule Take 200 mg by mouth 2 (two) times daily. 06/07/20   [provider]  clonazePAM (KLONOPIN) 0.5 MG tablet Take 0.5 mg by mouth only at the onset of a seizure 08/14/16   [provider]  Continuous Blood Gluc Sensor (FREESTYLE LIBRE 2 SENSOR) MISC 1 each by Does not apply route every 14 (fourteen) days. 06/30/20   Sowles, Drue Stager,  MD  dapagliflozin propanediol (FARXIGA) 10 MG TABS tablet Take 1 tablet (10 mg total) by mouth daily before breakfast. 05/30/20   Ancil Boozer, Drue Stager, MD  EPINEPHrine (EPIPEN 2-PAK) 0.3 mg/0.3 mL IJ SOAJ injection Inject 0.3 mg into the muscle once as needed (for anaphylaxis).  Patient not taking: Reported on 07/13/2020 06/14/14   [provider]  fluticasone (FLONASE) 50 MCG/ACT nasal spray Place 2 sprays into both nostrils daily as needed. 01/28/18   [provider]  glucose blood (ONETOUCH VERIO) test strip Use  2x a day with OneTouch Verio Flex 07/03/19   Philemon Kingdom, MD  Insulin Glargine (LANTUS SOLOSTAR) 100 UNIT/ML Solostar Pen Inject 50 Units into the skin daily. 07/03/19   Philemon Kingdom, MD  Insulin Pen Needle 32G X 4 MM MISC Use 1x a day 07/03/19   Philemon Kingdom, MD  MAPAP 500 MG capsule Take 2 capsules by mouth every 8 (eight) hours. 06/07/20   [provider]  meloxicam (MOBIC) 15 MG tablet Take 15 mg by mouth 3 (three) times daily. 06/18/20   [provider]  metFORMIN (GLUCOPHAGE XR) 750 MG 24 hr tablet Take 2 tablets (1,500 mg total) by mouth daily. 07/03/19   Philemon Kingdom, MD  montelukast (SINGULAIR) 10 MG tablet TAKE 1 TABLET BY MOUTH EVERYDAY AT BEDTIME 02/20/20   Steele Sizer, MD  ondansetron (ZOFRAN-ODT) 4 MG disintegrating tablet Take 1 tablet (4 mg total) by mouth every 8 (eight) hours as needed for nausea or vomiting. 07/13/20   Damere Brandenburg, Charline Bills, PA-C  OneTouch Delica Lancets 35T MISC Use 2x a day with [employer] 07/03/19   Philemon Kingdom, MD  OZEMPIC, 1 MG/DOSE, 2 MG/1.5ML SOPN INJECT 1 MG INTO THE SKIN ONCE A WEEK. 02/16/20   Philemon Kingdom, MD  rosuvastatin (CRESTOR) 10 MG tablet Take 1 tablet (10 mg total) by mouth daily. 05/30/20   Steele Sizer, MD  tiZANidine (ZANAFLEX) 2 MG tablet Take 2 mg by mouth at bedtime as needed. 06/07/20   [provider]  traMADol (ULTRAM) 50 MG tablet tramadol 50 mg tablet  Take 1 tablet every 6 hours by oral route as needed.    [provider]  Vitamin D, Ergocalciferol, (DRISDOL) 1.25 MG (50000 UNIT) CAPS capsule TAKE 1 CAPSULE (50,000 UNITS TOTAL) BY MOUTH EVERY 7 (SEVEN) DAYS. 06/13/20   Steele Sizer, MD  zonisamide (ZONEGRAN) 100 MG capsule Take 400 mg by mouth at bedtime.     Roque Cash, MD    Allergies Peanuts [peanut oil] and Aspirin  Family History  Problem Relation Age of Onset  . Diabetes Mother   . Breast cancer Paternal Grandmother 23  . Cancer Paternal  Grandmother   . Cancer Paternal Aunt   . Cancer Maternal Grandmother   . Heart disease Neg Hx     Social History Social History   Tobacco Use  . Smoking status: Never Smoker  . Smokeless tobacco: Never Used  Vaping Use  . Vaping Use: Never used  Substance Use Topics  . Alcohol use: Yes    Comment: occass  . Drug use: No     Review of Systems  Constitutional: No fever/chills Eyes: No visual changes. No discharge ENT: No upper respiratory complaints. Cardiovascular: no chest pain. Respiratory: no cough. No SOB. Gastrointestinal: No abdominal pain.  Positive for nausea, vomiting, diarrhea.  No constipation. Genitourinary: Negative for dysuria. No hematuria.  Menstrual cycle started 2 days ago, patient with typical pelvic cramping but sensation of vaginal bleeding. Musculoskeletal: Negative for  musculoskeletal pain. Skin: Negative for rash, abrasions, lacerations, ecchymosis. Neurological: Negative for headaches, focal weakness or numbness. 10-point ROS otherwise negative.  ____________________________________________   PHYSICAL EXAM:  VITAL SIGNS: ED Triage Vitals  Enc Vitals Group     BP 07/13/20 1344 (!) 169/91     Pulse Rate 07/13/20 1344 86     Resp 07/13/20 1344 16     Temp 07/13/20 1351 98.4 F (36.9 C)     Temp Source 07/13/20 1351 Oral     SpO2 07/13/20 1344 99 %     Weight 07/13/20 1345 (!) 316 lb (143.3 kg)     Height 07/13/20 1345 5' 7"  (1.702 m)     Head Circumference --      Peak Flow --      Pain Score 07/13/20 1345 10     Pain Loc --      Pain Edu? --      Excl. in [place]? --      Constitutional: Alert and oriented. Well appearing and in no acute distress. Eyes: Conjunctivae are normal. PERRL. EOMI. Head: Atraumatic. ENT:      Ears:       Nose: No congestion/rhinnorhea.      Mouth/Throat: Mucous membranes are moist.  Neck: No stridor.    Cardiovascular: Normal rate, regular rhythm. Normal S1 and S2.  Good peripheral  circulation. Respiratory: Normal respiratory effort without tachypnea or retractions. Lungs CTAB. Good air entry to the bases with no decreased or absent breath sounds. Gastrointestinal: Bowel sounds 4 quadrants. Soft and nontender to palpation.  Patient with tenderness in the suprapubic region only.  No extension into the lower quadrants.  This is midline.  No guarding or rigidity. No palpable masses. No distention. No CVA tenderness. Musculoskeletal: Full range of motion to all extremities. No gross deformities appreciated. Neurologic:  Normal speech and language. No gross focal neurologic deficits are appreciated.  Skin:  Skin is warm, dry and intact. No rash noted. Psychiatric: Mood and affect are normal. Speech and behavior are normal. Patient exhibits appropriate insight and judgement.   ____________________________________________   LABS (all labs ordered are listed, but only abnormal results are displayed)  Labs Reviewed  COMPREHENSIVE METABOLIC PANEL - Abnormal; Notable for the following components:      Result Value   Sodium 134 (*)    Glucose, Bld 236 (*)    Total Protein 8.6 (*)    All other components within normal limits  CBC - Abnormal; Notable for the following components:   WBC 17.7 (*)    MCV 78.7 (*)    All other components within normal limits  URINALYSIS, COMPLETE (UACMP) WITH MICROSCOPIC - Abnormal; Notable for the following components:   Color, Urine YELLOW (*)    APPearance HAZY (*)    Glucose, UA 50 (*)    Hgb urine dipstick MODERATE (*)    Bacteria, UA RARE (*)    All other components within normal limits  SARS CORONAVIRUS 2 BY RT PCR (HOSPITAL ORDER, [place] [facility])  LIPASE, BLOOD  HCG, QUANTITATIVE, PREGNANCY   ____________________________________________  EKG   ____________________________________________  RADIOLOGY I personally viewed and evaluated these images as part of my medical decision making, as well as reviewing  the written report by the radiologist.  US PELVIC COMPLETE W TRANSVAGINAL AND TORSION R/O  Result Date: 07/13/2020 CLINICAL DATA:  Pelvic pain for 3 days EXAM: TRANSABDOMINAL AND TRANSVAGINAL ULTRASOUND OF PELVIS DOPPLER ULTRASOUND OF OVARIES TECHNIQUE: Both transabdominal and transvaginal  ultrasound examinations of the pelvis were performed. Transabdominal technique was performed for global imaging of the pelvis including uterus, ovaries, adnexal regions, and pelvic cul-de-sac. It was necessary to proceed with endovaginal exam following the transabdominal exam to visualize the pelvic viscera. Color and duplex Doppler ultrasound was utilized to evaluate blood flow to the ovaries. COMPARISON:  09/10/2018 FINDINGS: Uterus Measurements: 10.3 x 5.1 by 5.9 cm = volume: 162 mL. No uterine masses are identified. During the transabdominal exam, complex fluid is seen within the endocervical canal. During the transvaginal portion of the exam, the technologist reports the patient passed a large blood clot with resolution of the cervical canal fluid. Endometrium Thickness: 7 mm.  No focal abnormality visualized. Right ovary Measurements: 3.7 x 2.8 x 2.9 cm = volume: 15.5 mL. Normal appearance/no adnexal mass. Left ovary Nonvisualized. Pulsed Doppler evaluation of the right ovary demonstrates normal low-resistance arterial and venous waveforms. Other findings No abnormal free fluid. IMPRESSION: 1. Passage of a large blood clot during the performance of the exam. 2. No abnormalities visualized within the uterus or right ovary. The left ovary is not identified. Electronically Signed   By: Randa Ngo M.D.   On: 07/13/2020 19:18    ____________________________________________    PROCEDURES  Procedure(s) performed:    Procedures    Medications  oxyCODONE-acetaminophen (PERCOCET/ROXICET) 5-325 MG per tablet 1 tablet (1 tablet Oral Given 07/13/20 1350)  sodium chloride 0.9 % bolus 1,000 mL (1,000 mLs Intravenous  New Bag/Given 07/13/20 1710)     ____________________________________________   INITIAL IMPRESSION / ASSESSMENT AND PLAN / ED COURSE  Pertinent labs & imaging results that were available during my care of the patient were reviewed by me and considered in my medical decision making (see chart for details).  Review of the [place] CSRS was performed in accordance of the [place] prior to dispensing any controlled drugs.           Patient's diagnosis is consistent with dysmenorrhea, pelvic pain.  Patient presented to the emergency department with pelvic pain after starting her menstrual cycle 2 days ago then abruptly stopping her vaginal bleeding.  Patient states that she had pressure like she needed to pass a clot but was unable to pass any further blood.  During the ultrasound, patient passed a large clot.  Patient states that with the passage she had immediate improvement of her symptoms.  I will prescribe pain patient Midol and Zofran for any additional symptom control needed.  Patient was given IV fluids in the emergency department.  No indication for further work-up at this time.  Follow-up primary care as needed..  Patient is given ED precautions to return to the ED for any worsening or new symptoms.     ____________________________________________  FINAL CLINICAL IMPRESSION(S) / ED DIAGNOSES  Final diagnoses:  Pelvic pain  Dysmenorrhea      NEW MEDICATIONS STARTED DURING THIS VISIT:  ED Discharge Orders         Ordered    ondansetron (ZOFRAN-ODT) 4 MG disintegrating tablet  Every 8 hours PRN        07/13/20 1958    APAP-Pamabrom-Pyrilamine 500-25-15 MG TABS  3 times daily PRN        07/13/20 1958              This chart was dictated using voice recognition software/Dragon. Despite best efforts to proofread, errors can occur which can change the meaning. Any change was purely unintentional.    Darletta Moll, PA-C 07/13/20  [id number]    Nena Polio, MD 07/13/20  [id number]

## 2020-07-13 NOTE — ED Triage Notes (Signed)
Patient c/o pelvic and abdominal pain. Reports she started her menstrual cycle Monday and it completely stopped on Tuesday. Reports she normally has lots of clots with period. Pt states "My PCP sent me here for scan due to pain/issue and fluids for dehydration"

## 2020-07-13 NOTE — Telephone Encounter (Signed)
Pt called in and stated that she went to the ED yesterday for pain in her lower abdominal. The pt was requesting to be seen today. I told the pt I will send a message to the provider the pt verbally understood. The pt did take the appt on the 10th this Friday. I sent a chat to Dr. Marcelline Mates letting her know what the pt asked for.

## 2020-07-13 NOTE — ED Notes (Signed)
Patient verbally reported she understands not driving after taking pain medication.

## 2020-07-13 NOTE — Telephone Encounter (Signed)
I spoke with the patient's husband Mr. Danae Orleans. He is concerned that his wife has to wait for 11 hours in the ED. He asked if there is anything our office can do to speed up the process of getting her seen or if there is anything he can do for her at home so she does not have to wait. I informed the husband that she needs to be seen in the ED due to her health conditions per Dr. Roxan Hockey. She has to be evaluated in an urgent setting to get the care she needs. He understands but is distraught with concern for his wife's well being.

## 2020-07-13 NOTE — ED Notes (Signed)
Pt unable to sign E-signature due to signature pad malfunction. Pt verbalized understanding of d/c instructions and had no additional questions or concerns for this RN or provider. Pt left with d/c instructions and gathered all personal belongings from room and removed them prior to ED departure.

## 2020-07-13 NOTE — Progress Notes (Signed)
Name: Erin Good   MRN: [id number]    DOB: 01-16-1976   Date:07/13/2020       Progress Note  Subjective  Chief Complaint  Chief Complaint  Patient presents with  . Vomiting    Started yesterday around 3am  . Abdominal Pain  . Back Pain    I connected with  Larena N Gant-Pegues on 07/13/20 at  9:20 AM EDT by telephone and verified that I am speaking with the correct person using two identifiers.  I discussed the limitations, risks, security and privacy concerns of performing an evaluation and management service by telephone and the availability of in person appointments. The patient expressed understanding and agreed to proceed. Staff also discussed with the patient that there may be a patient responsible charge related to this service. Patient Location: Home Provider Location: [facility] Additional Individuals present: none  HPI Patient is a 44 year old female patient of Dr. Ancil Boozer Presents for a phone visit with the above complaints. She did proceed to an emergency room setting last evening, waited for 8 hours although it was noted that she had to leave to go home and take her medications and did not stay to be seen. Noted the marked limitations with this evaluation by phone.  Patient noted sx's started at about 3 AM the night prior with cramping pain in lower abdomen, During the day, it got worse. Vomited at about 0800 yesterday, and vomited 7 X yesterday, drinking water, not eat all yesterday, nausea all day. No blood. Blood sugar was spiking, 294 yesterday was highest, 212 this morning. Not vomited today. Was given zofran in ER one dose. No fevers,   Still feels pressure in abdomen, like a bad cramp and feels into her back at times.  Noted usually clots with her menstrual period and is having presently, started a little early, not bleeding now. She notes often gets cramps until the clot is pushed out with her periods. She thinks pressure is building until clot is passed.  Usually lasts an hour or so, not 12 hours Has tried midol, advil, tylenol Pressure constant, notes pain is 10/10 and "unbearable" Location of pain is more pelvic area/suprapubic No relationship to meals No diarrhea or constipation  No blood with BM's , no dark stools No unusual diet prior, no alcohol States no chance is pregnant, had tubes tied in 2007, only has one ovary. Denied any dysuria. Some frequency,  No h/o increased UTI's, had kidney stones in her past.  Labs done at ER reviewed  Urine - + blood, trace leuk's. +  Ketones, and protein noted CBC with a white count of 17,000 Comp panel with a high glucose of 201 Lipase normal, pregnancy test negative    Patient Active Problem List   Diagnosis Date Noted  . Vitamin D deficiency 01/31/2017  . Right shoulder tendinitis 01/31/2017  . Tendinosis 01/31/2017  . Bell's palsy 12/19/2016  . Tachycardia 09/30/2016  . Depression with anxiety 05/06/2015  . Allergic rhinitis 04/20/2015  . Anxiety and depression 04/20/2015  . 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 Gastro-esophageal reflux disease without esophagitis 04/20/2015  . Dysmetabolic syndrome 03/00/9233  . Extreme obesity 04/20/2015  . NASH (nonalcoholic steatohepatitis) 04/20/2015  . Allergy to nuts 04/20/2015  . Calculus of kidney 04/20/2015  . Type 2 diabetes mellitus with renal manifestations ([place]) 04/20/2015  . Central sleep apnea 11/26/2008  . Dyslipidemia 07/02/2008  . Leukocytosis 07/29/2007    Past Surgical History:  Procedure Laterality Date  .  BREAST BIOPSY Left 2018   benign  . CESAREAN SECTION     X 2  . RIGHT OOPHORECTOMY Right 2001   benign tumor  . TUBAL LIGATION  2007    Family History  Problem Relation Age of Onset  . Diabetes Mother   . Breast cancer Paternal Grandmother 10  . Cancer Paternal Grandmother   . Cancer Paternal Aunt   . Cancer Maternal Grandmother   . Heart disease Neg Hx     Social History   Tobacco Use  .  Smoking status: Never Smoker  . Smokeless tobacco: Never Used  Substance Use Topics  . Alcohol use: Yes    Comment: occass     Current Outpatient Medications:  .  atenolol (TENORMIN) 25 MG tablet, TAKE 1 TABLET BY MOUTH EVERY DAY, Disp: 30 tablet, Rfl: 0 .  celecoxib (CELEBREX) 200 MG capsule, Take 200 mg by mouth 2 (two) times daily., Disp: , Rfl:  .  clonazePAM (KLONOPIN) 0.5 MG tablet, Take 0.5 mg by mouth only at the onset of a seizure, Disp: , Rfl: 3 .  Continuous Blood Gluc Sensor (FREESTYLE LIBRE 2 SENSOR) MISC, 1 each by Does not apply route every 14 (fourteen) days., Disp: 2 each, Rfl: 5 .  dapagliflozin propanediol (FARXIGA) 10 MG TABS tablet, Take 1 tablet (10 mg total) by mouth daily before breakfast., Disp: 30 tablet, Rfl: 2 .  fluticasone (FLONASE) 50 MCG/ACT nasal spray, Place 2 sprays into both nostrils daily as needed., Disp: , Rfl: 12 .  glucose blood (ONETOUCH VERIO) test strip, Use 2x a day with OneTouch Verio Flex, Disp: 200 each, Rfl: 3 .  Insulin Glargine (LANTUS SOLOSTAR) 100 UNIT/ML Solostar Pen, Inject 50 Units into the skin daily., Disp: 10 pen, Rfl: 5 .  Insulin Pen Needle 32G X 4 MM MISC, Use 1x a day, Disp: 100 each, Rfl: 3 .  MAPAP 500 MG capsule, Take 2 capsules by mouth every 8 (eight) hours., Disp: , Rfl:  .  meloxicam (MOBIC) 15 MG tablet, Take 15 mg by mouth 3 (three) times daily., Disp: , Rfl:  .  metFORMIN (GLUCOPHAGE XR) 750 MG 24 hr tablet, Take 2 tablets (1,500 mg total) by mouth daily., Disp: 180 tablet, Rfl: 3 .  montelukast (SINGULAIR) 10 MG tablet, TAKE 1 TABLET BY MOUTH EVERYDAY AT BEDTIME, Disp: 90 tablet, Rfl: 0 .  OneTouch Delica Lancets 70Y MISC, Use 2x a day with OneTouch Verio Flex, Disp: 200 each, Rfl: 3 .  OZEMPIC, 1 MG/DOSE, 2 MG/1.5ML SOPN, INJECT 1 MG INTO THE SKIN ONCE A WEEK., Disp: 3 pen, Rfl: 5 .  rosuvastatin (CRESTOR) 10 MG tablet, Take 1 tablet (10 mg total) by mouth daily., Disp: 90 tablet, Rfl: 1 .  tiZANidine (ZANAFLEX) 2 MG  tablet, Take 2 mg by mouth at bedtime as needed., Disp: , Rfl:  .  traMADol (ULTRAM) 50 MG tablet, tramadol 50 mg tablet  Take 1 tablet every 6 hours by oral route as needed., Disp: , Rfl:  .  Vitamin D, Ergocalciferol, (DRISDOL) 1.25 MG (50000 UNIT) CAPS capsule, TAKE 1 CAPSULE (50,000 UNITS TOTAL) BY MOUTH EVERY 7 (SEVEN) DAYS., Disp: 4 capsule, Rfl: 2 .  zonisamide (ZONEGRAN) 100 MG capsule, Take 400 mg by mouth at bedtime. , Disp: , Rfl:  .  EPINEPHrine (EPIPEN 2-PAK) 0.3 mg/0.3 mL IJ SOAJ injection, Inject 0.3 mg into the muscle once as needed (for anaphylaxis).  (Patient not taking: Reported on 07/13/2020), Disp: , Rfl:   Allergies  Allergen Reactions  . Peanuts [Peanut Oil] Anaphylaxis  . Aspirin Other (See Comments)    Does take because of her epilepsy/seizure     With staff assistance, above reviewed with the patient today.  ROS: As per HPI, otherwise no specific complaints on a limited and focused system review   Objective  Virtual encounter, vitals not obtained.  There is no height or weight on file to calculate BMI.  Physical Exam   Appears in NAD via conversation, pleasant Breathing: No obvious respiratory distress. Speaking in complete sentences Neurological: Pt is alert and oriented, Speech is normal Psychiatric: Patient has a normal mood and affect, behavior is normal. Judgment and thought content normal.   Results for orders placed or performed during the hospital encounter of 07/12/20 (from the past 72 hour(s))  Urinalysis, Routine w reflex microscopic Urine, Clean Catch     Status: Abnormal   Collection Time: 07/12/20  6:17 PM  Result Value Ref Range   Color, Urine YELLOW YELLOW   APPearance HAZY (A) CLEAR   Specific Gravity, Urine 1.019 1.005 - 1.030   pH 6.0 5.0 - 8.0   Glucose, UA NEGATIVE NEGATIVE mg/dL   Hgb urine dipstick LARGE (A) NEGATIVE   Bilirubin Urine NEGATIVE NEGATIVE   Ketones, ur 5 (A) NEGATIVE mg/dL   Protein, ur 30 (A) NEGATIVE mg/dL    Nitrite NEGATIVE NEGATIVE   Leukocytes,Ua TRACE (A) NEGATIVE   RBC / HPF 11-20 0 - 5 RBC/hpf   WBC, UA 0-5 0 - 5 WBC/hpf   Bacteria, UA RARE (A) NONE SEEN   Squamous Epithelial / LPF 0-5 0 - 5   Mucus PRESENT     Comment: Performed at [facility], [address] [place], [place] [address]  Lipase, blood     Status: None   Collection Time: 07/12/20  6:37 PM  Result Value Ref Range   Lipase 48 11 - 51 U/L    Comment: Performed at [facility] [address] [place], [place] [address]  Comprehensive metabolic panel     Status: Abnormal   Collection Time: 07/12/20  6:37 PM  Result Value Ref Range   Sodium 136 135 - 145 mmol/L   Potassium 3.5 3.5 - 5.1 mmol/L   Chloride 102 98 - 111 mmol/L   CO2 23 22 - 32 mmol/L   Glucose, Bld 201 (H) 70 - 99 mg/dL    Comment: Glucose reference range applies only to samples taken after fasting for at least 8 hours.   BUN 7 6 - 20 mg/dL   Creatinine, Ser 0.64 0.44 - 1.00 mg/dL   Calcium 9.2 8.9 - 10.3 mg/dL   Total Protein 7.8 6.5 - 8.1 g/dL   Albumin 3.6 3.5 - 5.0 g/dL   AST 16 15 - 41 U/L   ALT 22 0 - 44 U/L   Alkaline Phosphatase 85 38 - 126 U/L   Total Bilirubin 0.3 0.3 - 1.2 mg/dL   GFR calc non Af Amer >60 >60 mL/min   GFR calc Af Amer >60 >60 mL/min   Anion gap 11 5 - 15    Comment: Performed at [facility], [place] [address] [place] [address]  CBC     Status: Abnormal   Collection Time: 07/12/20  6:37 PM  Result Value Ref Range   WBC 17.0 (H) 4.0 - 10.5 K/uL   RBC 4.82 3.87 - 5.11 MIL/uL   Hemoglobin 12.1 12.0 - 15.0 g/dL  HCT 39.1 36 - 46 %   MCV 81.1 80.0 - 100.0 fL   MCH 25.1 (L) 26.0 - 34.0 pg   MCHC 30.9 30.0 - 36.0 g/dL   RDW 13.8 11.5 - 15.5 %   Platelets 377 150 - 400 K/uL   nRBC 0.0 0.0 - 0.2 %    Comment: Performed at [facility], [place] [address], [place], [place] [address]  I-Stat beta hCG blood, ED     Status: None   Collection Time: 07/12/20  6:51 PM  Result Value Ref Range    I-stat hCG, quantitative <5.0 <5 mIU/mL   Comment 3            Comment:   GEST. AGE      CONC.  (mIU/mL)   <=1 WEEK        5 - 50     2 WEEKS       50 - 500     3 WEEKS       100 - 10,000     4 WEEKS     1,000 - 30,000        FEMALE AND NON-PREGNANT FEMALE:     LESS THAN 5 mIU/mL     PHQ2/9: Depression screen [facility] 2/9 07/13/2020 06/30/2020 05/30/2020 11/12/2019 05/25/2019  Decreased Interest 0 0 0 0 0  Down, Depressed, Hopeless 0 0 0 0 0  PHQ - 2 Score 0 0 0 0 0  Altered sleeping - 0 0 0 0  Tired, decreased energy - 0 0 0 2  Change in appetite - 0 0 0 0  Feeling bad or failure about yourself  - 0 0 0 0  Trouble concentrating - 0 0 0 0  Moving slowly or fidgety/restless - 0 0 0 0  Suicidal thoughts - 0 0 0 0  PHQ-9 Score - 0 0 0 2  Difficult doing work/chores - - - - Not difficult at all  Some recent data might be hidden   PHQ-2/9 Result reviewed  Fall Risk: Fall Risk  07/13/2020 06/30/2020 05/30/2020 05/03/2020 11/12/2019  Falls in the past year? 1 1 0 0 0  Number falls in past yr: 1 0 0 - 0  Injury with Fall? 1 1 0 - 0  Comment - - - - -  Follow up Falls evaluation completed - - - -   Labs above from the ER reviewed  Urine - + blood, trace leuk's. +  Ketones, and protein noted CBC with a white count of 17,000 Comp panel with a high glucose of 201 Lipase normal, pregnancy test negative   Assessment & Plan 1. Lower abdominal pain 2. Non-intractable vomiting with nausea, unspecified vomiting type 3. Hyperglycemia 4. Ketonuria 5. Leukocytosis, unspecified type 6. Type 2 diabetes mellitus with microalbuminuria, with long-term current use of insulin [facility])  Discussed with patient's concerns with her presentation.  She notes the abdominal pressure/pain being 10 out of 10 and unbearable, with significant vomiting yesterday, and her white count elevated from lab in the ER.  Also her urine had blood (may be related to her menstrual cycle), and also trace leukocytes and ketones present.   She is diabetic, and notes her sugars have been higher on recent checks with concerns for for her hydration status noted.  (Very likely dehydrated) Noted the marked limitations with this being a phone visit, I do feel she needs to be assessed and seen, and do feel the emergency room setting is best presently, with  that work-up initiated by them before she left due to the time waiting.  Do have concerns for possible infection, although does seem more than a typical urinary tract infection as we discussed.  I did feel bad recommending she return to the ER, noting the long wait and Covid concerns presently, although do feel it is the best approach presently, and indicated given the above.  It was recommended she return to the ER to help with further evaluation and management.  I discussed the assessment and treatment plan with the patient. The patient was provided an opportunity to ask questions and all were answered. The patient agreed with the plan and demonstrated an understanding of the instructions.   The patient was advised to call back or seek an in-person evaluation if the symptoms worsen or if the condition fails to improve as anticipated.  I provided 25 minutes of non-face-to-face time during this encounter that included discussing at length patient's sx/history, pertinent pmhx, medications, treatment and follow up plan. This time also included the necessary documentation, orders, and chart review.  Towanda Malkin, MD

## 2020-07-15 ENCOUNTER — Ambulatory Visit (INDEPENDENT_AMBULATORY_CARE_PROVIDER_SITE_OTHER): Payer: 59 | Admitting: Obstetrics and Gynecology

## 2020-07-15 ENCOUNTER — Other Ambulatory Visit: Payer: Self-pay

## 2020-07-15 ENCOUNTER — Encounter: Payer: Self-pay | Admitting: Obstetrics and Gynecology

## 2020-07-15 VITALS — BP 120/84 | HR 80 | Ht 67.0 in | Wt 317.1 lb

## 2020-07-15 DIAGNOSIS — E1129 Type 2 diabetes mellitus with other diabetic kidney complication: Secondary | ICD-10-CM | POA: Diagnosis not present

## 2020-07-15 DIAGNOSIS — R102 Pelvic and perineal pain: Secondary | ICD-10-CM | POA: Diagnosis not present

## 2020-07-15 DIAGNOSIS — R809 Proteinuria, unspecified: Secondary | ICD-10-CM

## 2020-07-15 DIAGNOSIS — R399 Unspecified symptoms and signs involving the genitourinary system: Secondary | ICD-10-CM | POA: Diagnosis not present

## 2020-07-15 DIAGNOSIS — Z794 Long term (current) use of insulin: Secondary | ICD-10-CM

## 2020-07-15 DIAGNOSIS — N926 Irregular menstruation, unspecified: Secondary | ICD-10-CM | POA: Diagnosis not present

## 2020-07-15 NOTE — Progress Notes (Signed)
Pt present for f/u of Emergency Room visit for pelvic pain.

## 2020-07-15 NOTE — Progress Notes (Signed)
GYNECOLOGY PROGRESS NOTE  Subjective:    Patient ID: Erin Good, female    DOB: 10-Jul-1976, 44 y.o.   MRN: [id number]  HPI  Patient is a 44 y.o. G2P0202 female who presents for follow up after Emergency Room in [place] on 07/12/2020, however after having approximately 10-hour wait notes that she left without being seen.  She then was seen by her PCP on 07/13/2020 who recommended that she follow back up in the emergency room due to her complaints of pelvic pain, so she was seen at [facility].  Her main complaint was that she noted cramping was more intense during her recent cycle which led her to come to the ER as this was unsual for her, along with bleeding that only lasted x 1 day (cycles normally 2-3 days).  She has been noticing passage of large clots with her cycles lately.    Erin Good also feels that she thinks she might have a UTI. Noting some mild irritation with urination. Notes having ;urine checked in the ER but still noting symptoms.    The following portions of the patient's history were reviewed and updated as appropriate: allergies, current medications, past family history, past medical history, past social history, past surgical history and problem list.  Review of Systems Pertinent items noted in HPI and remainder of comprehensive ROS otherwise negative.   Objective:   Pulse 80, height 5' 7"  (1.702 m), weight (!) 317 lb 1.6 oz (143.8 kg), last menstrual period 07/11/2020. Body mass index is 49.66 kg/m. General appearance: alert and no distress Abdomen: soft, non-tender; bowel sounds normal; no masses,  no organomegaly Pelvic: deferred    Labs:  Admission on 07/13/2020, Discharged on 07/13/2020  Component Date Value Ref Range Status  . Lipase 07/13/2020 28  11 - 51 U/L Final   Performed at [facility], [address] [place], [place] [address]  . Sodium 07/13/2020 134* 135 - 145 mmol/L Final  . Potassium 07/13/2020 3.7  3.5 - 5.1 mmol/L Final  .  Chloride 07/13/2020 101  98 - 111 mmol/L Final  . CO2 07/13/2020 22  22 - 32 mmol/L Final  . Glucose, Bld 07/13/2020 236* 70 - 99 mg/dL Final   Glucose reference range applies only to samples taken after fasting for at least 8 hours.  . BUN 07/13/2020 6  6 - 20 mg/dL Final  . Creatinine, Ser 07/13/2020 0.67  0.44 - 1.00 mg/dL Final  . Calcium 07/13/2020 9.0  8.9 - 10.3 mg/dL Final  . Total Protein 07/13/2020 8.6* 6.5 - 8.1 g/dL Final  . Albumin 07/13/2020 4.0  3.5 - 5.0 g/dL Final  . AST 07/13/2020 22  15 - 41 U/L Final  . ALT 07/13/2020 23  0 - 44 U/L Final  . Alkaline Phosphatase 07/13/2020 84  38 - 126 U/L Final  . Total Bilirubin 07/13/2020 0.6  0.3 - 1.2 mg/dL Final  . GFR calc non Af Amer 07/13/2020 >60  >60 mL/min Final  . GFR calc Af Amer 07/13/2020 >60  >60 mL/min Final  . Anion gap 07/13/2020 11  5 - 15 Final   Performed at [facility], [address] [place], [place] [address]  . WBC 07/13/2020 17.7* 4.0 - 10.5 K/uL Final  . RBC 07/13/2020 4.93  3.87 - 5.11 MIL/uL Final  . Hemoglobin 07/13/2020 12.8  12.0 - 15.0 g/dL Final  . HCT 07/13/2020 38.8  36 - 46 % Final  . MCV 07/13/2020 78.7* 80.0 - 100.0  fL Final  . MCH 07/13/2020 26.0  26.0 - 34.0 pg Final  . MCHC 07/13/2020 33.0  30.0 - 36.0 g/dL Final  . RDW 07/13/2020 13.8  11.5 - 15.5 % Final  . Platelets 07/13/2020 394  150 - 400 K/uL Final  . nRBC 07/13/2020 0.0  0.0 - 0.2 % Final   Performed at [facility], [address] [place], [place] [address]  . Color, Urine 07/13/2020 YELLOW* YELLOW Final  . APPearance 07/13/2020 HAZY* CLEAR Final  . Specific Gravity, Urine 07/13/2020 1.015  1.005 - 1.030 Final  . pH 07/13/2020 7.0  5.0 - 8.0 Final  . Glucose, UA 07/13/2020 50* NEGATIVE mg/dL Final  . Hgb urine dipstick 07/13/2020 MODERATE* NEGATIVE Final  . Bilirubin Urine 07/13/2020 NEGATIVE  NEGATIVE Final  . Ketones, ur 07/13/2020 NEGATIVE  NEGATIVE mg/dL Final  . Protein, ur 07/13/2020 NEGATIVE   NEGATIVE mg/dL Final  . Nitrite 07/13/2020 NEGATIVE  NEGATIVE Final  . Chalmers Guest 07/13/2020 NEGATIVE  NEGATIVE Final  . RBC / HPF 07/13/2020 0-5  0 - 5 RBC/hpf Final  . WBC, UA 07/13/2020 0-5  0 - 5 WBC/hpf Final  . Bacteria, UA 07/13/2020 RARE* NONE SEEN Final  . Squamous Epithelial / LPF 07/13/2020 0-5  0 - 5 Final  . Amorphous Crystal 07/13/2020 PRESENT   Final   Performed at [facility], [address] [place], [place] [address]  . SARS Coronavirus 2 07/13/2020 NEGATIVE  NEGATIVE Final   Comment: (NOTE) SARS-CoV-2 target nucleic acids are NOT DETECTED.  The SARS-CoV-2 RNA is generally detectable in upper and lower respiratory specimens during the acute phase of infection. The lowest concentration of SARS-CoV-2 viral copies this assay can detect is 250 copies / mL. A negative result does not preclude SARS-CoV-2 infection and should not be used as the sole basis for treatment or other patient management decisions.  A negative result may occur with improper specimen collection / handling, submission of specimen other than nasopharyngeal swab, presence of viral mutation(s) within the areas targeted by this assay, and inadequate number of viral copies (<250 copies / mL). A negative result must be combined with clinical observations, patient history, and epidemiological information.  Fact Sheet for Patients:   StrictlyIdeas.no  Fact Sheet for Healthcare Providers: BankingDealers.co.za  This test is not yet approved or                           cleared by the [place] FDA and has been authorized for detection and/or diagnosis of SARS-CoV-2 by FDA under an Emergency Use Authorization (EUA).  This EUA will remain in effect (meaning this test can be used) for the duration of the COVID-19 declaration under Section 564(b)(1) of the Act, 21 U.S.C. section 360bbb-3(b)(1), unless the authorization is terminated or revoked  sooner.  Performed at [facility], [address], [place],  [address]   . hCG, Beta Chain, Quant, S 07/13/2020 1  <5 mIU/mL Final   Comment:          GEST. AGE      CONC.  (mIU/mL)   <=1 WEEK        5 - 50     2 WEEKS       50 - 500     3 WEEKS       100 - 10,000     4 WEEKS     1,000 - 30,000     5 WEEKS  3,500 - 115,000   6-8 WEEKS     12,000 - 270,000    12 WEEKS     15,000 - 220,000        FEMALE AND NON-PREGNANT FEMALE:     LESS THAN 5 mIU/mL Performed at [facility], [address] [place], [place] [address]     Assessment:   1. Abnormal menstrual cycle   2. Pelvic cramping   3. Type 2 diabetes mellitus with microalbuminuria, with long-term current use of insulin ([place])   4. UTI symptoms    Plan:   1.  Abnormal menstrual cycle with significant pelvic cramping -patient notes this is the first occurrence.  Cycles are usually regularly occurring with mild to moderate dysmenorrhea.  Discussed that this could be a one-time occurrence, or as patient is in her 55s could be the beginning of changes in her cycle as she approaches perimenopause and menopausal status.  Offered expectant management to see if subsequent periods returned to normal, or could offer hormonal options to regulate menstrual cycles.  Patient states that she would be interested in hormonal options as she would like to not necessarily have to have monthly cycles at this time.  Discussed use of hormonal IUD, which patient declines that she knows she has had a previous bad experience.  Also discussed options of progesterone oral contraceptives or progesterone regulation with Aygestin.  Would not recommend use of Depo-Provera due to patient's increased BMI and concerns for further weight gain.  Also other methods with high doses of estrogen content and would not recommend as well as this could increase her risk of thrombotic disease.  Patient is okay to begin oral contraceptives.  Given  samples of S[place]nd for 2 months.  We will follow-up with a televisit to reassess cycles. 2.  Patient with uncontrolled diabetes as noted on recent labs with elevated glucose levels in urine. Advised on improving diabetes as this can be a cause of abnormality in cycle. Last HgbA1c was 8 several weeks ago.   3.  Patient with complaints of urinary symptoms, irritation with voiding.  Recent urinalysis performed in the emergency room several days ago with no evidence of UTI.  Discussed increasing fluid intake and the use of Azo over-the-counter for her symptoms.   A total of 15 minutes were spent face-to-face with the patient during this encounter and over half of that time dealt with counseling and coordination of care.   [name], MD [facility]

## 2020-07-17 ENCOUNTER — Encounter: Payer: Self-pay | Admitting: Obstetrics and Gynecology

## 2020-07-28 ENCOUNTER — Encounter: Payer: Self-pay | Admitting: Internal Medicine

## 2020-07-28 ENCOUNTER — Ambulatory Visit (INDEPENDENT_AMBULATORY_CARE_PROVIDER_SITE_OTHER): Payer: 59 | Admitting: Internal Medicine

## 2020-07-28 ENCOUNTER — Other Ambulatory Visit: Payer: Self-pay

## 2020-07-28 VITALS — BP 118/78 | HR 117 | Ht 67.0 in | Wt 322.2 lb

## 2020-07-28 DIAGNOSIS — Z794 Long term (current) use of insulin: Secondary | ICD-10-CM

## 2020-07-28 DIAGNOSIS — E042 Nontoxic multinodular goiter: Secondary | ICD-10-CM | POA: Diagnosis not present

## 2020-07-28 DIAGNOSIS — R809 Proteinuria, unspecified: Secondary | ICD-10-CM | POA: Diagnosis not present

## 2020-07-28 DIAGNOSIS — E1129 Type 2 diabetes mellitus with other diabetic kidney complication: Secondary | ICD-10-CM | POA: Diagnosis not present

## 2020-07-28 DIAGNOSIS — E785 Hyperlipidemia, unspecified: Secondary | ICD-10-CM

## 2020-07-28 MED ORDER — LANTUS SOLOSTAR 100 UNIT/ML ~~LOC~~ SOPN: 60.0000 [IU] | PEN_INJECTOR | Freq: Every day | SUBCUTANEOUS | 3 refills | Status: DC

## 2020-07-28 NOTE — Progress Notes (Signed)
Patient ID: Erin Good, female   DOB: [birth year], 44 y.o.   MRN: [id number]   This visit occurred during the SARS-CoV-2 public health emergency.  Safety protocols were in place, including screening questions prior to the visit, additional usage of staff PPE, and extensive cleaning of exam room while observing appropriate contact time as indicated for disinfecting solutions.   HPI: Erin Good is a 44 y.o.-year-old female, initially referred by her PCP, Dr. Ancil Boozer, presenting for follow-up for DM2, dx in ~2015, insulin-dependent since 2019, uncontrolled, with microalbuminuria and also for thyroid nodules.  Last visit 9.5 months ago (virtual)  She continues to work from home.  She has a frozen L shoulder >> was on Prednisone x 1 mo 3 mo ago.  Reviewed HbA1c levels: Lab Results  Component Value Date   HGBA1C 9.0 (H) 06/30/2020   HGBA1C 8.3 (A) 05/30/2020   HGBA1C 8.0 (A) 11/12/2019   HGBA1C 10.9 (H) 05/25/2019   HGBA1C 10.0 (A) 01/29/2019   HGBA1C 10.4 (A) 04/01/2018   HGBA1C 9.8 12/18/2017   HGBA1C 9.4 09/17/2017   HGBA1C 10.9 06/13/2017   HGBA1C 10.3 (H) 01/31/2017   Pt was on a regimen of: - Metformin ER 750 mg 2x a day, with meals - Soliqua (Lantus + Lixisenatide) 50 units daily in am  Currently on: - Metformin ER 750 mg 2x a day with meals (forgot to move the entire Metformin dose at night, I suggested last visit) - Lantus 50 units at bedtime  (forgot to increase the dose of Lantus as suggested at last visit) - Ozempic 1 mg weekly Tried Farxiga 06/2020 >> not covered.  She checks her sugars 1-2 times a day - no log, no meter: - am: 220-230 >> 146-200, 280 >> 220-330, 393 - 2h after b'fast: n/c - before lunch: n/c >> 92-180s - 2h after lunch: 170-180 >> 98-118 >> n/c >> 140-180 - before dinner: n/c >> 120-150 >> 150-201 - 2h after dinner: n/c - bedtime: n/c >> 150s - nighttime: n/c Lowest sugar was 96 (after a meal) >> 98 >> 92; + hypoglycemia  awareness at 100. Highest sugar was 300 >> 280 >> 393  Glucometer: [employer] advance  Pt's meals are: - Breakfast: 2 eggs + 3 pieces of bacon - snack: fruit - Lunch: salad - Dinner:meat + veggie + starch (rice + pasta) - Snacks: 1  Reducing sodas: Coke >> sparkling water Still drinks juice.  -No CKD, last BUN/creatinine:  Lab Results  Component Value Date   BUN 6 07/13/2020   BUN 7 07/12/2020   CREATININE 0.67 07/13/2020   CREATININE 0.64 07/12/2020  She is not on ACE inhibitor/ARB.  ACR levels were slightly high: Lab Results  Component Value Date   MICRALBCREAT 33 (H) 11/12/2019   MICRALBCREAT 30 (H) 01/29/2019   -+ HL; last set of lipids: Lab Results  Component Value Date   CHOL 204 (H) 06/30/2020   HDL 46 (L) 06/30/2020   LDLCALC 135 (H) 06/30/2020   TRIG 118 06/30/2020   CHOLHDL 4.4 06/30/2020  She was started on Crestor 10 by PCP recently.  - last eye exam was in 2020: No DR reportedly.  -No numbness and tingling in her feet.  Pt has FH of DM in mother, MGF.  Thyroid nodules:  Reviewed previous work-up: 06/01/2019: Thyroid ultrasound: Parenchymal Echotexture: Mildly heterogenous Isthmus: 0.4 cm thickness Right lobe: 5 x 2.4 x 2.8 cm Left lobe: 4.5 x 2 x 2.1 cm  Estimated total number of nodules >/=  1 cm: 4  Nodule # 1: Location: Right; Mid Maximum size: 1.6 cm; Other 2 dimensions: 1.5 x 1.4 cm Composition: mixed cystic and solid (1) Echogenicity: hypoechoic (2) *Given size (>/= 1.5 - 2.4 cm) and appearance, a follow-up ultrasound in 1 year should be considered based on TI-RADS criteria. _________________________________________________________  Nodule # 2: Location: Right; Inferior Maximum size: 2.3 cm; Other 2 dimensions: 1.9 x 1.9 cm Composition: solid/almost completely solid (2) Echogenicity: hypoechoic (2) **Given size (>/= 1.5 cm) and appearance, fine needle aspiration of this moderately suspicious nodule should be considered based  on TI-RADS criteria. _________________________________________________  Nodule # 3: Location: Left; Inferior Maximum size: 1.4 cm; Other 2 dimensions: 1.2 x 1.2 cm Composition: solid/almost completely solid (2) Echogenicity: isoechoic (1) Given size (<1.4 cm) and appearance, this nodule does NOT meet TI-RADS criteria for biopsy or dedicated follow-up. _________________________________________________________  Nodule # 4: Location: Left; Mid Maximum size: 1.8 cm; Other 2 dimensions: 1.6 x 1.3 cm Composition: solid/almost completely solid (2) Echogenicity: hypoechoic (2) **Given size (>/= 1.5 cm) and appearance, fine needle aspiration of this moderately suspicious nodule should be considered based on TI-RADS criteria.  IMPRESSION: 1. Thyromegaly with bilateral nodules. 2. Recommend FNA biopsy of moderately suspicious 1.8 cm mid left AND 2.3 cm inferior right nodules. 3. Recommend annual/biennial ultrasound follow-up of additional nodules as above, until stability x5 years confirmed.  08/05/2019: FNA of the 2 nodules: Clinical History: Right inferior 2.3cm; Other 2 dimensions: 1.9 x 1.9cm,  Solid / almost completely solid, Hypoechoic, TI-RADS total points 4  Specimen Submitted: A. THYROID, RLP, FINE NEEDLE ASPIRATION:  DIAGNOSIS:  - Consistent with benign follicular nodule (Bethesda category II)  SPECIMEN ADEQUACY:  Satisfactory for evaluation   Clinical History: Left mid 1.8cm; Other 2 dimensions: 1.6 x 1.3cm, Solid  / almost completely solid, Hypoechoic, TI-RADS total points 4  Specimen Submitted: A. THYROID, LMP, FINE NEEDLE ASPIRATION:  DIAGNOSIS:  - Consistent with benign follicular nodule (Bethesda category II)  SPECIMEN ADEQUACY:  Satisfactory for evaluation  Reviewed her thyroid tests: Lab Results  Component Value Date   TSH 1.35 05/25/2019   TSH 1.070 07/28/2015   TSH 1.13 10/15/2014   Pt denies: - feeling nodules in neck - hoarseness - dysphagia -  choking - SOB with lying down  She also has a history of PCOS and distant history of epilepsy at 44 years old.  ROS: Constitutional: + weight gain/+ weight loss, no fatigue, no subjective hyperthermia, no subjective hypothermia Eyes: no blurry vision, no xerophthalmia ENT: no sore throat, + see HPI Cardiovascular: no CP/no SOB/no palpitations/no leg swelling Respiratory: no cough/no SOB/no wheezing Gastrointestinal: no N/no V/no D/no C/no acid reflux Musculoskeletal: no muscle aches/no joint aches Skin: no rashes, no hair loss Neurological: no tremors/no numbness/no tingling/no dizziness  I reviewed pt's medications, allergies, PMH, social hx, family hx, and changes were documented in the history of present illness. Otherwise, unchanged from my initial visit note.  Past Medical History:  Diagnosis Date  . Allergic rhinitis   . Breast discharge 06/05/2017   2 weeks ago left  . Breast mass 12/06/2016   left  . Diabetes mellitus without complication ([place])   . Dyslipidemia   . Epilepsy ([place])   . Epilepsy ([place])   . Febrile seizures ([place])   . Galactorrhea   . Hx gestational diabetes   . Kidney stones   . Obesity   . Seizures ([place])   . Sleep apnea   . Syncope and collapse   . Tachycardia  Past Surgical History:  Procedure Laterality Date  . BREAST BIOPSY Left 2018   benign  . CESAREAN SECTION     X 2  . RIGHT OOPHORECTOMY Right 2001   benign tumor  . TUBAL LIGATION  2007   Social History   Socioeconomic History  . Marital status: Married    Spouse name: Roderic Palau  . Number of children: 2  . Years of education: College  . Highest education level: Not on file  Occupational History  . Occupation: Optometrist  Tobacco Use  . Smoking status: Never Smoker  . Smokeless tobacco: Never Used  Vaping Use  . Vaping Use: Never used  Substance and Sexual Activity  . Alcohol use: Yes    Comment: occass  . Drug use: No  . Sexual activity: Yes    Partners: Male    Birth  control/protection: Surgical    Comment: tubial lig  Other Topics Concern  . Not on file  Social History Narrative  . Not on file   Social Determinants of Health   Financial Resource Strain: Low Risk   . Difficulty of Paying Living Expenses: Not hard at all  Food Insecurity: No Food Insecurity  . Worried About Charity fundraiser in the Last Year: Never true  . Ran Out of Food in the Last Year: Never true  Transportation Needs: No Transportation Needs  . Lack of Transportation (Medical): No  . Lack of Transportation (Non-Medical): No  Physical Activity:   . Days of Exercise per Week: Not on file  . Minutes of Exercise per Session: Not on file  Stress: No Stress Concern Present  . Feeling of Stress : Not at all  Social Connections: Moderately Isolated  . Frequency of Communication with Friends and Family: More than three times a week  . Frequency of Social Gatherings with Friends and Family: More than three times a week  . Attends Religious Services: Never  . Active Member of Clubs or Organizations: No  . Attends Archivist Meetings: Never  . Marital Status: Married  Human resources officer Violence: Not At Risk  . Fear of Current or Ex-Partner: No  . Emotionally Abused: No  . Physically Abused: No  . Sexually Abused: No   Meds: - Metformin ER 750 mg 2x a day, with meals - Soliqua (Lantus + Lixisenatide) 50 units dialy in am + at Current Outpatient Medications on File Prior to Visit  Medication Sig Dispense Refill  . APAP-Pamabrom-Pyrilamine 500-25-15 MG TABS Take 2 tablets by mouth 3 (three) times daily as needed (Pelvic pain). 24 tablet 0  . atenolol (TENORMIN) 25 MG tablet TAKE 1 TABLET BY MOUTH EVERY DAY 30 tablet 0  . celecoxib (CELEBREX) 200 MG capsule Take 200 mg by mouth 2 (two) times daily.    . clonazePAM (KLONOPIN) 0.5 MG tablet Take 0.5 mg by mouth only at the onset of a seizure  3  . Continuous Blood Gluc Sensor (FREESTYLE LIBRE 2 SENSOR) MISC 1 each by Does  not apply route every 14 (fourteen) days. 2 each 5  . dapagliflozin propanediol (FARXIGA) 10 MG TABS tablet Take 1 tablet (10 mg total) by mouth daily before breakfast. 30 tablet 2  . EPINEPHrine (EPIPEN 2-PAK) 0.3 mg/0.3 mL IJ SOAJ injection Inject 0.3 mg into the muscle once as needed (for anaphylaxis).     . fluticasone (FLONASE) 50 MCG/ACT nasal spray Place 2 sprays into both nostrils daily as needed.  12  . glucose blood (ONETOUCH VERIO) test strip  Use 2x a day with OneTouch Verio Flex 200 each 3  . Insulin Glargine (LANTUS SOLOSTAR) 100 UNIT/ML Solostar Pen Inject 50 Units into the skin daily. 10 pen 5  . Insulin Pen Needle 32G X 4 MM MISC Use 1x a day 100 each 3  . MAPAP 500 MG capsule Take 2 capsules by mouth every 8 (eight) hours.    . meloxicam (MOBIC) 15 MG tablet Take 15 mg by mouth 3 (three) times daily.    . metFORMIN (GLUCOPHAGE XR) 750 MG 24 hr tablet Take 2 tablets (1,500 mg total) by mouth daily. 180 tablet 3  . montelukast (SINGULAIR) 10 MG tablet TAKE 1 TABLET BY MOUTH EVERYDAY AT BEDTIME 90 tablet 0  . ondansetron (ZOFRAN-ODT) 4 MG disintegrating tablet Take 1 tablet (4 mg total) by mouth every 8 (eight) hours as needed for nausea or vomiting. 20 tablet 0  . OneTouch Delica Lancets 04U MISC Use 2x a day with OneTouch Verio Flex 200 each 3  . OZEMPIC, 1 MG/DOSE, 2 MG/1.5ML SOPN INJECT 1 MG INTO THE SKIN ONCE A WEEK. 3 pen 5  . rosuvastatin (CRESTOR) 10 MG tablet Take 1 tablet (10 mg total) by mouth daily. 90 tablet 1  . tiZANidine (ZANAFLEX) 2 MG tablet Take 2 mg by mouth at bedtime as needed.    . traMADol (ULTRAM) 50 MG tablet tramadol 50 mg tablet  Take 1 tablet every 6 hours by oral route as needed.    . Vitamin D, Ergocalciferol, (DRISDOL) 1.25 MG (50000 UNIT) CAPS capsule TAKE 1 CAPSULE (50,000 UNITS TOTAL) BY MOUTH EVERY 7 (SEVEN) DAYS. 4 capsule 2  . zonisamide (ZONEGRAN) 100 MG capsule Take 400 mg by mouth at bedtime.      No current facility-administered medications  on file prior to visit.   Allergies  Allergen Reactions  . Peanuts [Peanut Oil] Anaphylaxis  . Aspirin Other (See Comments)    Does take because of her epilepsy/seizure    Family History  Problem Relation Age of Onset  . Diabetes Mother   . Breast cancer Paternal Grandmother 68  . Cancer Paternal Grandmother   . Cancer Paternal Aunt   . Cancer Maternal Grandmother   . Heart disease Neg Hx    PE: BP 118/78 (BP Location: Right Arm, Patient Position: Sitting, Cuff Size: Large)   Pulse (!) 117   Ht 5' 7"  (1.702 m)   Wt (!) 322 lb 3.2 oz (146.1 kg)   LMP 07/11/2020   SpO2 97%   BMI 50.46 kg/m  Wt Readings from Last 3 Encounters:  07/28/20 (!) 322 lb 3.2 oz (146.1 kg)  07/15/20 (!) 317 lb 1.6 oz (143.8 kg)  07/13/20 (!) 316 lb (143.3 kg)   Constitutional: overweight, in NAD Eyes: PERRLA, EOMI, no exophthalmos ENT: moist mucous membranes, no thyromegaly, no cervical lymphadenopathy Cardiovascular: RRR, No MRG Respiratory: CTA B Gastrointestinal: abdomen soft, NT, ND, BS+ Musculoskeletal: no deformities, strength intact in all 4 Skin: moist, warm, no rashes Neurological: no tremor with outstretched hands, DTR normal in all 4  ASSESSMENT: 1. DM2, insulin-dependent, uncontrolled, with complications - MAU  2.  Thyroid nodules  3. HL  PLAN:  1. Patient with longstanding, uncontrolled, type 2 diabetes, on oral antidiabetic regimen with Metformin and also weekly GLP-1 receptor agonist (tolerated well-lost 11 pounds right after starting it) and basal insulin.  At last visit, sugars are much better almost at goal during the day, especially midday but still high in the morning.  She was not  checking sugars after dinner at bedtime and I advised her to start doing so.  We also discussed about trying to move the entire metformin dose with dinner to hopefully improve morning sugars, however, she forgot.  We also discussed about decreasing the dose of Lantus, but she also forgot to do  this. -However, latest HbA1c obtained last month in PCPs office was higher, at 9.0%. -At that time, PCP tried to add [place], but this was not covered by her insurance -At today's visit, sugars are extremely high in the morning and they are better later in the day.  We discussed about stopping sweet drinks as a first step towards improving her diabetes control.  Also, we will ask her to move the entire Metformin ER dose with dinner.  We will also need to increase Lantus, initially to 60 units and then most likely even higher.  At next visit, if sugars are still not improved, she may need to add dinnertime rapid acting insulin, or possibly with every meal - I suggested to:  Patient Instructions  Please move: - Metformin ER 1500 mg with dinner  Continue: - Ozempic 1 mg weekly  Please increase: - Lantus 60 units at bedtime (may need to increase further to 65 or even 70 units)  STOP JUICE!!!  Please return in 3 months with your sugar log.   - advised to check sugars at different times of the day - 2x a day, rotating check times - advised for yearly eye exams >> she is not UTD - return to clinic in 3 months  2.  Thyroid nodules -No neck compression symptoms -Latest TSH was reviewed and this was normal in 05/2019: Lab Results  Component Value Date   TSH 1.35 05/25/2019  -Reviewed the results of her most recent thyroid ultrasound from 05/2019 and the 2 thyroid nodule biopsies from 07/2019.  These were benign.  These were benign. -We will repeat another thyroid ultrasound in 3-6 mo  3. HL -Reviewed latest lipid panel from 06/2020: LDL high, HDL low, triglycerides at goal: Lab Results  Component Value Date   CHOL 204 (H) 06/30/2020   HDL 46 (L) 06/30/2020   LDLCALC 135 (H) 06/30/2020   TRIG 118 06/30/2020   CHOLHDL 4.4 06/30/2020  -She was started on Crestor 10 by PCP  Philemon Kingdom, MD PhD [facility] Endocrinology

## 2020-07-28 NOTE — Patient Instructions (Addendum)
Please move: - Metformin ER 1500 mg with dinner  Continue: - Ozempic 1 mg weekly  Please increase: - Lantus 60 units at bedtime (may need to increase further to 65 or even 70 units)  STOP JUICE!!!  Please return in 3 months with your sugar log.

## 2020-08-01 ENCOUNTER — Other Ambulatory Visit: Payer: Self-pay | Admitting: Internal Medicine

## 2020-08-01 DIAGNOSIS — E1169 Type 2 diabetes mellitus with other specified complication: Secondary | ICD-10-CM

## 2020-08-01 DIAGNOSIS — E1129 Type 2 diabetes mellitus with other diabetic kidney complication: Secondary | ICD-10-CM

## 2020-08-10 NOTE — Progress Notes (Deleted)
Name: Erin Good   MRN: [id number]    DOB: 1976/10/11   Date:08/10/2020       Progress Note  Subjective  Chief Complaint  No chief complaint on file.   HPI  Morbid obesity: she is seeing a nutritionist and plans on having bariatric surgery in October , she is monitor her intake, portion control, her weight is not going down, she states she continues to gain weight.   DMII: she was seeing endocrinologist Dr. Cruzita Lederer , but last visit was Dec 2020. She states she has been compliant with her medications and denies side effects. No polyphagia, polydipsia or polyuria. Glucose at home has been around 178 fasting, after meals it goes down to 120. Usually higher in am's. Eye exam is due . We will add Jardiance today, we will also give her diflucan . Discussed a low carbohydrate diet around 100 g per day   Leucocytosis: we will recheck level, she has seen Dr. Janese Banks back in 2018 and was given reassurance   Tachycardia: chronic with possible surgery in one month Patient Active Problem List   Diagnosis Date Noted  . Vitamin D deficiency 01/31/2017  . Right shoulder tendinitis 01/31/2017  . Tendinosis 01/31/2017  . Bell's palsy 12/19/2016  . Tachycardia 09/30/2016  . Depression with anxiety 05/06/2015  . Allergic rhinitis 04/20/2015  . Anxiety and depression 04/20/2015  . 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 Gastro-esophageal reflux disease without esophagitis 04/20/2015  . Dysmetabolic syndrome 83/15/1761  . Extreme obesity 04/20/2015  . NASH (nonalcoholic steatohepatitis) 04/20/2015  . Allergy to nuts 04/20/2015  . Calculus of kidney 04/20/2015  . 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 Central sleep apnea 11/26/2008  . Dyslipidemia 07/02/2008  . Leukocytosis 07/29/2007    Past Surgical History:  Procedure Laterality Date  . BREAST BIOPSY Left 2018   benign  . CESAREAN SECTION     X 2  . RIGHT OOPHORECTOMY Right 2001   benign tumor   . TUBAL LIGATION  2007    Family History  Problem Relation Age of Onset  . Diabetes Mother   . Breast cancer Paternal Grandmother 68  . Cancer Paternal Grandmother   . Cancer Paternal Aunt   . Cancer Maternal Grandmother   . Heart disease Neg Hx     Social History   Tobacco Use  . Smoking status: Never Smoker  . Smokeless tobacco: Never Used  Substance Use Topics  . Alcohol use: Yes    Comment: occass     Current Outpatient Medications:  .  APAP-Pamabrom-Pyrilamine 500-25-15 MG TABS, Take 2 tablets by mouth 3 (three) times daily as needed (Pelvic pain)., Disp: 24 tablet, Rfl: 0 .  atenolol (TENORMIN) 25 MG tablet, TAKE 1 TABLET BY MOUTH EVERY DAY, Disp: 30 tablet, Rfl: 0 .  celecoxib (CELEBREX) 200 MG capsule, Take 200 mg by mouth 2 (two) times daily., Disp: , Rfl:  .  clonazePAM (KLONOPIN) 0.5 MG tablet, Take 0.5 mg by mouth only at the onset of a seizure, Disp: , Rfl: 3 .  Continuous Blood Gluc Sensor (FREESTYLE LIBRE 2 SENSOR) MISC, 1 each by Does not apply route every 14 (fourteen) days., Disp: 2 each, Rfl: 5 .  EPINEPHrine (EPIPEN 2-PAK) 0.3 mg/0.3 mL IJ SOAJ injection, Inject 0.3 mg into the muscle once as needed (for anaphylaxis). , Disp: , Rfl:  .  fluticasone (FLONASE) 50 MCG/ACT nasal spray, Place 2 sprays into both nostrils daily as needed., Disp: ,  Rfl: 12 .  Insulin Pen Needle 32G X 4 MM MISC, Use 1x a day, Disp: 100 each, Rfl: 3 .  LANTUS SOLOSTAR 100 UNIT/ML Solostar Pen, INJECT 50 UNITS INTO THE SKIN DAILY., Disp: 15 mL, Rfl: 2 .  MAPAP 500 MG capsule, Take 2 capsules by mouth every 8 (eight) hours., Disp: , Rfl:  .  meloxicam (MOBIC) 15 MG tablet, Take 15 mg by mouth 3 (three) times daily., Disp: , Rfl:  .  metFORMIN (GLUCOPHAGE-XR) 750 MG 24 hr tablet, TAKE 2 TABLETS (1,500 MG TOTAL) BY MOUTH DAILY., Disp: 180 tablet, Rfl: 1 .  montelukast (SINGULAIR) 10 MG tablet, TAKE 1 TABLET BY MOUTH EVERYDAY AT BEDTIME, Disp: 90 tablet, Rfl: 0 .  ondansetron (ZOFRAN-ODT)  4 MG disintegrating tablet, Take 1 tablet (4 mg total) by mouth every 8 (eight) hours as needed for nausea or vomiting., Disp: 20 tablet, Rfl: 0 .  OneTouch Delica Lancets 10U MISC, Use 2x a day with OneTouch Verio Flex, Disp: 200 each, Rfl: 3 .  ONETOUCH VERIO test strip, USE 2X A DAY WITH ONETOUCH VERIO FLEX, Disp: 50 strip, Rfl: 15 .  OZEMPIC, 1 MG/DOSE, 2 MG/1.5ML SOPN, INJECT 1 MG INTO THE SKIN ONCE A WEEK., Disp: 3 pen, Rfl: 5 .  rosuvastatin (CRESTOR) 10 MG tablet, Take 1 tablet (10 mg total) by mouth daily., Disp: 90 tablet, Rfl: 1 .  tiZANidine (ZANAFLEX) 2 MG tablet, Take 2 mg by mouth at bedtime as needed., Disp: , Rfl:  .  traMADol (ULTRAM) 50 MG tablet, tramadol 50 mg tablet  Take 1 tablet every 6 hours by oral route as needed., Disp: , Rfl:  .  Vitamin D, Ergocalciferol, (DRISDOL) 1.25 MG (50000 UNIT) CAPS capsule, TAKE 1 CAPSULE (50,000 UNITS TOTAL) BY MOUTH EVERY 7 (SEVEN) DAYS., Disp: 4 capsule, Rfl: 2 .  zonisamide (ZONEGRAN) 100 MG capsule, Take 400 mg by mouth at bedtime. , Disp: , Rfl:   Allergies  Allergen Reactions  . Peanuts [Peanut Oil] Anaphylaxis  . Aspirin Other (See Comments)    Does take because of her epilepsy/seizure     I personally reviewed {Reviewed:14835} with the patient/caregiver today.   ROS  ***  Objective  There were no vitals filed for this visit.  There is no height or weight on file to calculate BMI.  Physical Exam ***  Recent Results (from the past 2160 hour(s))  POCT HgB A1C     Status: Abnormal   Collection Time: 05/30/20  4:00 PM  Result Value Ref Range   Hemoglobin A1C 8.3 (A) 4.0 - 5.6 %   HbA1c POC (<> result, manual entry)     HbA1c, POC (prediabetic range)     HbA1c, POC (controlled diabetic range)    Lipid panel     Status: Abnormal   Collection Time: 06/30/20  9:00 AM  Result Value Ref Range   Cholesterol 204 (H) <200 mg/dL   HDL 46 (L) > OR = 50 mg/dL   Triglycerides 118 <150 mg/dL   LDL Cholesterol (Calc) 135 (H)  mg/dL (calc)    Comment: Reference range: <100 . Desirable range <100 mg/dL for primary prevention;   <70 mg/dL for patients with CHD or diabetic patients  with > or = 2 CHD risk factors. Marland Kitchen LDL-C is now calculated using the Martin-Hopkins  calculation, which is a validated novel method providing  better accuracy than the Friedewald equation in the  estimation of LDL-C.  Cresenciano Genre et al. Annamaria Helling. 7253;664(40): 2061-2068  (http://education.[facility].com/faq/FAQ164)  Total CHOL/HDL Ratio 4.4 <5.0 (calc)   Non-HDL Cholesterol (Calc) 158 (H) <130 mg/dL (calc)    Comment: For patients with diabetes plus 1 major ASCVD risk  factor, treating to a non-HDL-C goal of <100 mg/dL  (LDL-C of <70 mg/dL) is considered a therapeutic  option.   COMPLETE METABOLIC PANEL WITH GFR     Status: Abnormal   Collection Time: 06/30/20  9:00 AM  Result Value Ref Range   Glucose, Bld 214 (H) 65 - 99 mg/dL    Comment: .            Fasting reference interval . For someone without known diabetes, a glucose value >125 mg/dL indicates that they may have diabetes and this should be confirmed with a follow-up test. .    BUN 12 7 - 25 mg/dL   Creat 0.66 0.50 - 1.10 mg/dL   GFR, Est Non African American 109 > OR = 60 mL/min/1.27m   GFR, Est African American 126 > OR = 60 mL/min/1.752m  BUN/Creatinine Ratio NOT APPLICABLE 6 - 22 (calc)   Sodium 135 135 - 146 mmol/L   Potassium 4.1 3.5 - 5.3 mmol/L   Chloride 103 98 - 110 mmol/L   CO2 23 20 - 32 mmol/L   Calcium 9.1 8.6 - 10.2 mg/dL   Total Protein 7.3 6.1 - 8.1 g/dL   Albumin 4.1 3.6 - 5.1 g/dL   Globulin 3.2 1.9 - 3.7 g/dL (calc)   AG Ratio 1.3 1.0 - 2.5 (calc)   Total Bilirubin 0.3 0.2 - 1.2 mg/dL   Alkaline phosphatase (APISO) 83 31 - 125 U/L   AST 14 10 - 30 U/L   ALT 20 6 - 29 U/L  CBC with Differential/Platelet     Status: Abnormal   Collection Time: 06/30/20  9:00 AM  Result Value Ref Range   WBC 14.5 (H) 3.8 - 10.8 Thousand/uL   RBC  4.78 3.80 - 5.10 Million/uL   Hemoglobin 12.4 11.7 - 15.5 g/dL   HCT 38.0 35 - 45 %   MCV 79.5 (L) 80.0 - 100.0 fL   MCH 25.9 (L) 27.0 - 33.0 pg   MCHC 32.6 32.0 - 36.0 g/dL   RDW 14.0 11.0 - 15.0 %   Platelets 343 140 - 400 Thousand/uL   MPV 11.6 7.5 - 12.5 fL   Neutro Abs 10,672 (H) 1,500 - 7,800 cells/uL   Lymphs Abs 3,132 850 - 3,900 cells/uL   Absolute Monocytes 638 200 - 950 cells/uL   Eosinophils Absolute 15 15 - 500 cells/uL   Basophils Absolute 44 0 - 200 cells/uL   Neutrophils Relative % 73.6 %   Total Lymphocyte 21.6 %   Monocytes Relative 4.4 %   Eosinophils Relative 0.1 %   Basophils Relative 0.3 %  Hepatitis C antibody     Status: None   Collection Time: 06/30/20  9:00 AM  Result Value Ref Range   Hepatitis C Ab NON-REACTIVE NON-REACTI   SIGNAL TO CUT-OFF 0.02 <1.00    Comment: . HCV antibody was non-reactive. There is no laboratory  evidence of HCV infection. . In most cases, no further action is required. However, if recent HCV exposure is suspected, a test for HCV RNA (test code [id number] suggested. . For additional information please refer to http://education.[facility].com/faq/FAQ22v1 (This link is being provided for informational/ educational purposes only.) .   Hemoglobin A1c     Status: Abnormal   Collection Time: 06/30/20  9:00 AM  Result Value Ref  Range   Hgb A1c MFr Bld 9.0 (H) <5.7 % of total Hgb    Comment: For someone without known diabetes, a hemoglobin A1c value of 6.5% or greater indicates that they may have  diabetes and this should be confirmed with a follow-up  test. . For someone with known diabetes, a value <7% indicates  that their diabetes is well controlled and a value  greater than or equal to 7% indicates suboptimal  control. A1c targets should be individualized based on  duration of diabetes, age, comorbid conditions, and  other considerations. . Currently, no consensus exists regarding use of hemoglobin A1c for  diagnosis of diabetes for children. .    Mean Plasma Glucose 212 (calc)   eAG (mmol/L) 11.7 (calc)  Fructosamine     Status: None   Collection Time: 06/30/20  9:00 AM  Result Value Ref Range   Fructosamine 272 205 - 285 umol/L  Urinalysis, Routine w reflex microscopic Urine, Clean Catch     Status: Abnormal   Collection Time: 07/12/20  6:17 PM  Result Value Ref Range   Color, Urine YELLOW YELLOW   APPearance HAZY (A) CLEAR   Specific Gravity, Urine 1.019 1.005 - 1.030   pH 6.0 5.0 - 8.0   Glucose, UA NEGATIVE NEGATIVE mg/dL   Hgb urine dipstick LARGE (A) NEGATIVE   Bilirubin Urine NEGATIVE NEGATIVE   Ketones, ur 5 (A) NEGATIVE mg/dL   Protein, ur 30 (A) NEGATIVE mg/dL   Nitrite NEGATIVE NEGATIVE   Leukocytes,Ua TRACE (A) NEGATIVE   RBC / HPF 11-20 0 - 5 RBC/hpf   WBC, UA 0-5 0 - 5 WBC/hpf   Bacteria, UA RARE (A) NONE SEEN   Squamous Epithelial / LPF 0-5 0 - 5   Mucus PRESENT     Comment: Performed at [facility], [address] [place], [place] [address]  Lipase, blood     Status: None   Collection Time: 07/12/20  6:37 PM  Result Value Ref Range   Lipase 48 11 - 51 U/L    Comment: Performed at [facility] [address], [place], [place] [address]  Comprehensive metabolic panel     Status: Abnormal   Collection Time: 07/12/20  6:37 PM  Result Value Ref Range   Sodium 136 135 - 145 mmol/L   Potassium 3.5 3.5 - 5.1 mmol/L   Chloride 102 98 - 111 mmol/L   CO2 23 22 - 32 mmol/L   Glucose, Bld 201 (H) 70 - 99 mg/dL    Comment: Glucose reference range applies only to samples taken after fasting for at least 8 hours.   BUN 7 6 - 20 mg/dL   Creatinine, Ser 0.64 0.44 - 1.00 mg/dL   Calcium 9.2 8.9 - 10.3 mg/dL   Total Protein 7.8 6.5 - 8.1 g/dL   Albumin 3.6 3.5 - 5.0 g/dL   AST 16 15 - 41 U/L   ALT 22 0 - 44 U/L   Alkaline Phosphatase 85 38 - 126 U/L   Total Bilirubin 0.3 0.3 - 1.2 mg/dL   GFR calc non Af Amer >60 >60 mL/min   GFR calc Af Amer >60 >60  mL/min   Anion gap 11 5 - 15    Comment: Performed at [facility], [place] [address], [place], [place] [address]  CBC     Status: Abnormal   Collection Time: 07/12/20  6:37 PM  Result Value Ref Range   WBC 17.0 (H) 4.0 -  10.5 K/uL   RBC 4.82 3.87 - 5.11 MIL/uL   Hemoglobin 12.1 12.0 - 15.0 g/dL   HCT 39.1 36 - 46 %   MCV 81.1 80.0 - 100.0 fL   MCH 25.1 (L) 26.0 - 34.0 pg   MCHC 30.9 30.0 - 36.0 g/dL   RDW 13.8 11.5 - 15.5 %   Platelets 377 150 - 400 K/uL   nRBC 0.0 0.0 - 0.2 %    Comment: Performed at [facility] [address], [place], [place] [address]  I-Stat beta hCG blood, ED     Status: None   Collection Time: 07/12/20  6:51 PM  Result Value Ref Range   I-stat hCG, quantitative <5.0 <5 mIU/mL   Comment 3            Comment:   GEST. AGE      CONC.  (mIU/mL)   <=1 WEEK        5 - 50     2 WEEKS       50 - 500     3 WEEKS       100 - 10,000     4 WEEKS     1,000 - 30,000        FEMALE AND NON-PREGNANT FEMALE:     LESS THAN 5 mIU/mL   Lipase, blood     Status: None   Collection Time: 07/13/20  1:49 PM  Result Value Ref Range   Lipase 28 11 - 51 U/L    Comment: Performed at [facility], [address] [place], [place] [address]  Comprehensive metabolic panel     Status: Abnormal   Collection Time: 07/13/20  1:49 PM  Result Value Ref Range   Sodium 134 (L) 135 - 145 mmol/L   Potassium 3.7 3.5 - 5.1 mmol/L   Chloride 101 98 - 111 mmol/L   CO2 22 22 - 32 mmol/L   Glucose, Bld 236 (H) 70 - 99 mg/dL    Comment: Glucose reference range applies only to samples taken after fasting for at least 8 hours.   BUN 6 6 - 20 mg/dL   Creatinine, Ser 0.67 0.44 - 1.00 mg/dL   Calcium 9.0 8.9 - 10.3 mg/dL   Total Protein 8.6 (H) 6.5 - 8.1 g/dL   Albumin 4.0 3.5 - 5.0 g/dL   AST 22 15 - 41 U/L   ALT 23 0 - 44 U/L   Alkaline Phosphatase 84 38 - 126 U/L   Total Bilirubin 0.6 0.3 - 1.2 mg/dL   GFR calc non Af Amer >60 >60 mL/min   GFR calc Af Amer >60 >60 mL/min    Anion gap 11 5 - 15    Comment: Performed at [facility], [address] [place], [place] [address]  CBC     Status: Abnormal   Collection Time: 07/13/20  1:49 PM  Result Value Ref Range   WBC 17.7 (H) 4.0 - 10.5 K/uL   RBC 4.93 3.87 - 5.11 MIL/uL   Hemoglobin 12.8 12.0 - 15.0 g/dL   HCT 38.8 36 - 46 %   MCV 78.7 (L) 80.0 - 100.0 fL   MCH 26.0 26.0 - 34.0 pg   MCHC 33.0 30.0 - 36.0 g/dL   RDW 13.8 11.5 - 15.5 %   Platelets 394 150 - 400 K/uL   nRBC 0.0 0.0 - 0.2 %    Comment: Performed at [facility], [address] [place], [place]  [address]  Urinalysis, Complete w Microscopic Urine, Random     Status: Abnormal   Collection Time: 07/13/20  1:49 PM  Result Value Ref Range   Color, Urine YELLOW (A) YELLOW   APPearance HAZY (A) CLEAR   Specific Gravity, Urine 1.015 1.005 - 1.030   pH 7.0 5.0 - 8.0   Glucose, UA 50 (A) NEGATIVE mg/dL   Hgb urine dipstick MODERATE (A) NEGATIVE   Bilirubin Urine NEGATIVE NEGATIVE   Ketones, ur NEGATIVE NEGATIVE mg/dL   Protein, ur NEGATIVE NEGATIVE mg/dL   Nitrite NEGATIVE NEGATIVE   Leukocytes,Ua NEGATIVE NEGATIVE   RBC / HPF 0-5 0 - 5 RBC/hpf   WBC, UA 0-5 0 - 5 WBC/hpf   Bacteria, UA RARE (A) NONE SEEN   Squamous Epithelial / LPF 0-5 0 - 5   Amorphous Crystal PRESENT     Comment: Performed at [facility], [address] [place], [place] [address]  hCG, quantitative, pregnancy     Status: None   Collection Time: 07/13/20  1:49 PM  Result Value Ref Range   hCG, Beta Chain, Quant, S 1 <5 mIU/mL    Comment:          GEST. AGE      CONC.  (mIU/mL)   <=1 WEEK        5 - 50     2 WEEKS       50 - 500     3 WEEKS       100 - 10,000     4 WEEKS     1,000 - 30,000     5 WEEKS     3,500 - 115,000   6-8 WEEKS     12,000 - 270,000    12 WEEKS     15,000 - 220,000        FEMALE AND NON-PREGNANT FEMALE:     LESS THAN 5 mIU/mL Performed at [facility], [address] [place], [place] [address]    SARS Coronavirus 2 by RT PCR (hospital order, performed in [facility] hospital lab) Nasopharyngeal Nasopharyngeal Swab     Status: None   Collection Time: 07/13/20  4:44 PM   Specimen: Nasopharyngeal Swab  Result Value Ref Range   SARS Coronavirus 2 NEGATIVE NEGATIVE    Comment: (NOTE) SARS-CoV-2 target nucleic acids are NOT DETECTED.  The SARS-CoV-2 RNA is generally detectable in upper and lower respiratory specimens during the acute phase of infection. The lowest concentration of SARS-CoV-2 viral copies this assay can detect is 250 copies / mL. A negative result does not preclude SARS-CoV-2 infection and should not be used as the sole basis for treatment or other patient management decisions.  A negative result may occur with improper specimen collection / handling, submission of specimen other than nasopharyngeal swab, presence of viral mutation(s) within the areas targeted by this assay, and inadequate number of viral copies (<250 copies / mL). A negative result must be combined with clinical observations, patient history, and epidemiological information.  Fact Sheet for Patients:   StrictlyIdeas.no  Fact Sheet for Healthcare Providers: BankingDealers.co.za  This test is not yet approved or  cleared by the [place] FDA and has been authorized for detection and/or diagnosis of SARS-CoV-2 by FDA under an Emergency Use Authorization (EUA).  This EUA will remain in effect (meaning this test can be used) for the duration of the COVID-19 declaration under Section 564(b)(1) of the Act, 21 U.S.C. section 360bbb-3(b)(1), unless the authorization is terminated  or revoked sooner.  Performed at [facility], [address] [place], [place] [address]     Diabetic Foot Exam: Diabetic Foot Exam - Simple   No data filed     ***  PHQ2/9: Depression screen [facility] 2/9 07/13/2020 06/30/2020 05/30/2020 11/12/2019 05/25/2019   Decreased Interest 0 0 0 0 0  Down, Depressed, Hopeless 0 0 0 0 0  PHQ - 2 Score 0 0 0 0 0  Altered sleeping - 0 0 0 0  Tired, decreased energy - 0 0 0 2  Change in appetite - 0 0 0 0  Feeling bad or failure about yourself  - 0 0 0 0  Trouble concentrating - 0 0 0 0  Moving slowly or fidgety/restless - 0 0 0 0  Suicidal thoughts - 0 0 0 0  PHQ-9 Score - 0 0 0 2  Difficult doing work/chores - - - - Not difficult at all  Some recent data might be hidden    phq 9 is {gen pos KPT:465681} ***  Fall Risk: Fall Risk  07/13/2020 06/30/2020 05/30/2020 05/03/2020 11/12/2019  Falls in the past year? 1 1 0 0 0  Number falls in past yr: 1 0 0 - 0  Injury with Fall? 1 1 0 - 0  Comment - - - - -  Follow up Falls evaluation completed - - - -   ***   Functional Status Survey:   ***   Assessment & Plan  *** There are no diagnoses linked to this encounter.

## 2020-08-11 ENCOUNTER — Ambulatory Visit: Payer: 59 | Admitting: Family Medicine

## 2020-08-15 ENCOUNTER — Other Ambulatory Visit: Payer: Self-pay

## 2020-08-15 ENCOUNTER — Encounter: Payer: Self-pay | Admitting: Family Medicine

## 2020-08-15 ENCOUNTER — Ambulatory Visit (INDEPENDENT_AMBULATORY_CARE_PROVIDER_SITE_OTHER): Payer: 59 | Admitting: Family Medicine

## 2020-08-15 VITALS — BP 120/74 | HR 101 | Temp 97.9°F | Resp 18 | Ht 67.0 in | Wt 325.5 lb

## 2020-08-15 DIAGNOSIS — Z794 Long term (current) use of insulin: Secondary | ICD-10-CM | POA: Diagnosis not present

## 2020-08-15 DIAGNOSIS — Z01818 Encounter for other preprocedural examination: Secondary | ICD-10-CM

## 2020-08-15 DIAGNOSIS — R809 Proteinuria, unspecified: Secondary | ICD-10-CM

## 2020-08-15 DIAGNOSIS — R000 Tachycardia, unspecified: Secondary | ICD-10-CM

## 2020-08-15 DIAGNOSIS — D72829 Elevated white blood cell count, unspecified: Secondary | ICD-10-CM | POA: Diagnosis not present

## 2020-08-15 DIAGNOSIS — E1129 Type 2 diabetes mellitus with other diabetic kidney complication: Secondary | ICD-10-CM

## 2020-08-15 LAB — POCT GLYCOSYLATED HEMOGLOBIN (HGB A1C): Hemoglobin A1C: 7.9 % — AB (ref 4.0–5.6)

## 2020-08-15 NOTE — Progress Notes (Signed)
Name: Erin Good   MRN: [id number]    DOB: 1976-07-28   Date:08/15/2020       Progress Note  Subjective  Chief Complaint  Chief Complaint  Patient presents with  . Follow-up  . Medical Clearance    Surgical  . Diabetes    HPI  Left shoulder pain: she states symptoms started around April 2021 after COVID-19 vaccine, she states she does recall slipping in her shower before the vaccine. She went to [facility] but PT was not helping, she switched to [employer] and had MRI and was diagnosed with rotator cuff tear. She came in for pre-op clearance back in July but A1C was 8.3 %, pre-op clearance requested A1C below 7.5 % but she saw Ortho last Friday and he said he will proceed with surgery if below 8 %. Today A1C is 7.9%, may proceed to surgery based on DM  DMII: she is under the care of Dr. Renne Crigler again She states she has been compliant with her medications and denies side effects. No polyphagia, polydipsia or polyuria. Glucose fasting has been below 140 fasting, and highest of 184  She is taking Metformin 1500 mg daily , Lantus 53 units, Ozempic 1 mg . She has changed her diet , eating cucumbers throughout the day , chicken for protein   Leucocytosis: we will recheck level, she has seen Dr. Janese Banks back in 2018 and was given reassurance we will recheck level prior to surgery for baseline   Tachycardia: chronic seen by cardiologist, had a holter monitor but results not back, I will send Dr. Garen Lah a note to see if she is cleared in the cardiac view point for surgery    Patient Active Problem List   Diagnosis Date Noted  . Impingement syndrome of left shoulder region 04/20/2020  . Vitamin D deficiency 01/31/2017  . Right shoulder tendinitis 01/31/2017  . Tendinosis 01/31/2017  . Bell's palsy 12/19/2016  . Tachycardia 09/30/2016  . Depression with anxiety 05/06/2015  . Allergic rhinitis 04/20/2015  . Anxiety and depression 04/20/2015  . 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 Gastro-esophageal reflux disease without esophagitis 04/20/2015  . Dysmetabolic syndrome 40/98/1191  . Extreme obesity 04/20/2015  . NASH (nonalcoholic steatohepatitis) 04/20/2015  . Allergy to nuts 04/20/2015  . Calculus of kidney 04/20/2015  . Type 2 diabetes mellitus with renal manifestations ([place]) 04/20/2015  . Central sleep apnea 11/26/2008  . Dyslipidemia 07/02/2008  . Leukocytosis 07/29/2007    Past Surgical History:  Procedure Laterality Date  . BREAST BIOPSY Left 2018   benign  . CESAREAN SECTION     X 2  . RIGHT OOPHORECTOMY Right 2001   benign tumor  . TUBAL LIGATION  2007    Family History  Problem Relation Age of Onset  . Diabetes Mother   . Breast cancer Paternal Grandmother 68  . Cancer Paternal Grandmother   . Cancer Paternal Aunt   . Cancer Maternal Grandmother   . Heart disease Neg Hx     Social History   Tobacco Use  . Smoking status: Never Smoker  . Smokeless tobacco: Never Used  Substance Use Topics  . Alcohol use: Yes    Comment: occass     Current Outpatient Medications:  .  atenolol (TENORMIN) 25 MG tablet, TAKE 1 TABLET BY MOUTH EVERY DAY, Disp: 30 tablet, Rfl: 0 .  celecoxib (CELEBREX) 200 MG capsule, Take 200 mg by mouth 2 (two) times daily., Disp: , Rfl:  .  clonazePAM (KLONOPIN) 0.5 MG tablet, Take 0.5 mg by mouth only at the onset of a seizure, Disp: , Rfl: 3 .  EPINEPHrine (EPIPEN 2-PAK) 0.3 mg/0.3 mL IJ SOAJ injection, Inject 0.3 mg into the muscle once as needed (for anaphylaxis). , Disp: , Rfl:  .  fluticasone (FLONASE) 50 MCG/ACT nasal spray, Place 2 sprays into both nostrils daily as needed., Disp: , Rfl: 12 .  Insulin Pen Needle 32G X 4 MM MISC, Use 1x a day, Disp: 100 each, Rfl: 3 .  LANTUS SOLOSTAR 100 UNIT/ML Solostar Pen, INJECT 50 UNITS INTO THE SKIN DAILY., Disp: 15 mL, Rfl: 2 .  MAPAP 500 MG capsule, Take 2 capsules by mouth every 8 (eight) hours., Disp: , Rfl:  .  meloxicam (MOBIC) 15 MG  tablet, Take 15 mg by mouth 3 (three) times daily., Disp: , Rfl:  .  metFORMIN (GLUCOPHAGE-XR) 750 MG 24 hr tablet, TAKE 2 TABLETS (1,500 MG TOTAL) BY MOUTH DAILY., Disp: 180 tablet, Rfl: 1 .  montelukast (SINGULAIR) 10 MG tablet, TAKE 1 TABLET BY MOUTH EVERYDAY AT BEDTIME, Disp: 90 tablet, Rfl: 0 .  ondansetron (ZOFRAN-ODT) 4 MG disintegrating tablet, Take 1 tablet (4 mg total) by mouth every 8 (eight) hours as needed for nausea or vomiting., Disp: 20 tablet, Rfl: 0 .  OneTouch Delica Lancets 28M MISC, Use 2x a day with OneTouch Verio Flex, Disp: 200 each, Rfl: 3 .  ONETOUCH VERIO test strip, USE 2X A DAY WITH ONETOUCH VERIO FLEX, Disp: 50 strip, Rfl: 15 .  OZEMPIC, 1 MG/DOSE, 2 MG/1.5ML SOPN, INJECT 1 MG INTO THE SKIN ONCE A WEEK., Disp: 3 pen, Rfl: 5 .  rosuvastatin (CRESTOR) 10 MG tablet, Take 1 tablet (10 mg total) by mouth daily., Disp: 90 tablet, Rfl: 1 .  tiZANidine (ZANAFLEX) 2 MG tablet, Take 2 mg by mouth at bedtime as needed., Disp: , Rfl:  .  traMADol (ULTRAM) 50 MG tablet, tramadol 50 mg tablet  Take 1 tablet every 6 hours by oral route as needed., Disp: , Rfl:  .  Vitamin D, Ergocalciferol, (DRISDOL) 1.25 MG (50000 UNIT) CAPS capsule, TAKE 1 CAPSULE (50,000 UNITS TOTAL) BY MOUTH EVERY 7 (SEVEN) DAYS., Disp: 4 capsule, Rfl: 2 .  zonisamide (ZONEGRAN) 100 MG capsule, Take 400 mg by mouth at bedtime. , Disp: , Rfl:  .  APAP-Pamabrom-Pyrilamine 500-25-15 MG TABS, Take 2 tablets by mouth 3 (three) times daily as needed (Pelvic pain). (Patient not taking: Reported on 08/15/2020), Disp: 24 tablet, Rfl: 0 .  Continuous Blood Gluc Sensor (FREESTYLE LIBRE 2 SENSOR) MISC, 1 each by Does not apply route every 14 (fourteen) days. (Patient not taking: Reported on 08/15/2020), Disp: 2 each, Rfl: 5  Allergies  Allergen Reactions  . Peanuts [Peanut Oil] Anaphylaxis  . Aspirin Other (See Comments)    Does take because of her epilepsy/seizure     I personally reviewed active problem list,  medication list, allergies, family history, social history, health maintenance with the patient/caregiver today.   ROS  Constitutional: Negative for fever or weight change.  Respiratory: Negative for cough and shortness of breath.   Cardiovascular: Negative for chest pain or palpitations.  Gastrointestinal: Negative for abdominal pain, no bowel changes.  Musculoskeletal: Negative for gait problem or joint swelling.  Skin: Negative for rash.  Neurological: Negative for dizziness or headache.  No other specific complaints in a complete review of systems (except as listed in HPI above).  Objective  Vitals:   08/15/20 1337  BP: 120/74  Pulse: (!) 101  Resp: 18  Temp: 97.9 F (36.6 C)  TempSrc: Oral  SpO2: 100%  Weight: (!) 325 lb 8 oz (147.6 kg)  Height: 5' 7"  (1.702 m)    Body mass index is 50.98 kg/m.  Physical Exam  Constitutional: Patient appears well-developed and well-nourished. Obese  No distress.  HEENT: head atraumatic, normocephalic, pupils equal and reactive to light, neck supple Cardiovascular: Normal rate, regular rhythm and normal heart sounds.  No murmur heard. No BLE edema. Pulmonary/Chest: Effort normal and breath sounds normal. No respiratory distress. Abdominal: Soft.  There is no tenderness. Muscular skeletal: pain with abduction of left shoulder, positive impingement sign  Psychiatric: Patient has a normal mood and affect. behavior is normal. Judgment and thought content normal.  Recent Results (from the past 2160 hour(s))  POCT HgB A1C     Status: Abnormal   Collection Time: 05/30/20  4:00 PM  Result Value Ref Range   Hemoglobin A1C 8.3 (A) 4.0 - 5.6 %   HbA1c POC (<> result, manual entry)     HbA1c, POC (prediabetic range)     HbA1c, POC (controlled diabetic range)    Lipid panel     Status: Abnormal   Collection Time: 06/30/20  9:00 AM  Result Value Ref Range   Cholesterol 204 (H) <200 mg/dL   HDL 46 (L) > OR = 50 mg/dL   Triglycerides 118  <150 mg/dL   LDL Cholesterol (Calc) 135 (H) mg/dL (calc)    Comment: Reference range: <100 . Desirable range <100 mg/dL for primary prevention;   <70 mg/dL for patients with CHD or diabetic patients  with > or = 2 CHD risk factors. Marland Kitchen LDL-C is now calculated using the Martin-Hopkins  calculation, which is a validated novel method providing  better accuracy than the Friedewald equation in the  estimation of LDL-C.  Cresenciano Genre et al. Annamaria Helling. 7654;650(35): 2061-2068  (http://education.[facility].com/faq/FAQ164)    Total CHOL/HDL Ratio 4.4 <5.0 (calc)   Non-HDL Cholesterol (Calc) 158 (H) <130 mg/dL (calc)    Comment: For patients with diabetes plus 1 major ASCVD risk  factor, treating to a non-HDL-C goal of <100 mg/dL  (LDL-C of <70 mg/dL) is considered a therapeutic  option.   COMPLETE METABOLIC PANEL WITH GFR     Status: Abnormal   Collection Time: 06/30/20  9:00 AM  Result Value Ref Range   Glucose, Bld 214 (H) 65 - 99 mg/dL    Comment: .            Fasting reference interval . For someone without known diabetes, a glucose value >125 mg/dL indicates that they may have diabetes and this should be confirmed with a follow-up test. .    BUN 12 7 - 25 mg/dL   Creat 0.66 0.50 - 1.10 mg/dL   GFR, Est Non African American 109 > OR = 60 mL/min/1.61m   GFR, Est African American 126 > OR = 60 mL/min/1.758m  BUN/Creatinine Ratio NOT APPLICABLE 6 - 22 (calc)   Sodium 135 135 - 146 mmol/L   Potassium 4.1 3.5 - 5.3 mmol/L   Chloride 103 98 - 110 mmol/L   CO2 23 20 - 32 mmol/L   Calcium 9.1 8.6 - 10.2 mg/dL   Total Protein 7.3 6.1 - 8.1 g/dL   Albumin 4.1 3.6 - 5.1 g/dL   Globulin 3.2 1.9 - 3.7 g/dL (calc)   AG Ratio 1.3 1.0 - 2.5 (calc)   Total Bilirubin 0.3 0.2 - 1.2 mg/dL  Alkaline phosphatase (APISO) 83 31 - 125 U/L   AST 14 10 - 30 U/L   ALT 20 6 - 29 U/L  CBC with Differential/Platelet     Status: Abnormal   Collection Time: 06/30/20  9:00 AM  Result Value Ref Range    WBC 14.5 (H) 3.8 - 10.8 Thousand/uL   RBC 4.78 3.80 - 5.10 Million/uL   Hemoglobin 12.4 11.7 - 15.5 g/dL   HCT 38.0 35 - 45 %   MCV 79.5 (L) 80.0 - 100.0 fL   MCH 25.9 (L) 27.0 - 33.0 pg   MCHC 32.6 32.0 - 36.0 g/dL   RDW 14.0 11.0 - 15.0 %   Platelets 343 140 - 400 Thousand/uL   MPV 11.6 7.5 - 12.5 fL   Neutro Abs 10,672 (H) 1,500 - 7,800 cells/uL   Lymphs Abs 3,132 850 - 3,900 cells/uL   Absolute Monocytes 638 200 - 950 cells/uL   Eosinophils Absolute 15 15 - 500 cells/uL   Basophils Absolute 44 0 - 200 cells/uL   Neutrophils Relative % 73.6 %   Total Lymphocyte 21.6 %   Monocytes Relative 4.4 %   Eosinophils Relative 0.1 %   Basophils Relative 0.3 %  Hepatitis C antibody     Status: None   Collection Time: 06/30/20  9:00 AM  Result Value Ref Range   Hepatitis C Ab NON-REACTIVE NON-REACTI   SIGNAL TO CUT-OFF 0.02 <1.00    Comment: . HCV antibody was non-reactive. There is no laboratory  evidence of HCV infection. . In most cases, no further action is required. However, if recent HCV exposure is suspected, a test for HCV RNA (test code [phone]) is suggested. . For additional information please refer to http://education.[facility].com/faq/FAQ22v1 (This link is being provided for informational/ educational purposes only.) .   Hemoglobin A1c     Status: Abnormal   Collection Time: 06/30/20  9:00 AM  Result Value Ref Range   Hgb A1c MFr Bld 9.0 (H) <5.7 % of total Hgb    Comment: For someone without known diabetes, a hemoglobin A1c value of 6.5% or greater indicates that they may have  diabetes and this should be confirmed with a follow-up  test. . For someone with known diabetes, a value <7% indicates  that their diabetes is well controlled and a value  greater than or equal to 7% indicates suboptimal  control. A1c targets should be individualized based on  duration of diabetes, age, comorbid conditions, and  other considerations. . Currently, no consensus  exists regarding use of hemoglobin A1c for diagnosis of diabetes for children. .    Mean Plasma Glucose 212 (calc)   eAG (mmol/L) 11.7 (calc)  Fructosamine     Status: None   Collection Time: 06/30/20  9:00 AM  Result Value Ref Range   Fructosamine 272 205 - 285 umol/L  Urinalysis, Routine w reflex microscopic Urine, Clean Catch     Status: Abnormal   Collection Time: 07/12/20  6:17 PM  Result Value Ref Range   Color, Urine YELLOW YELLOW   APPearance HAZY (A) CLEAR   Specific Gravity, Urine 1.019 1.005 - 1.030   pH 6.0 5.0 - 8.0   Glucose, UA NEGATIVE NEGATIVE mg/dL   Hgb urine dipstick LARGE (A) NEGATIVE   Bilirubin Urine NEGATIVE NEGATIVE   Ketones, ur 5 (A) NEGATIVE mg/dL   Protein, ur 30 (A) NEGATIVE mg/dL   Nitrite NEGATIVE NEGATIVE   Leukocytes,Ua TRACE (A) NEGATIVE   RBC / HPF 11-20 0 -  5 RBC/hpf   WBC, UA 0-5 0 - 5 WBC/hpf   Bacteria, UA RARE (A) NONE SEEN   Squamous Epithelial / LPF 0-5 0 - 5   Mucus PRESENT     Comment: Performed at [facility], [place] [address] [place], [place] [address]  Lipase, blood     Status: None   Collection Time: 07/12/20  6:37 PM  Result Value Ref Range   Lipase 48 11 - 51 U/L    Comment: Performed at [place] [address] [place], [place] [address]  Comprehensive metabolic panel     Status: Abnormal   Collection Time: 07/12/20  6:37 PM  Result Value Ref Range   Sodium 136 135 - 145 mmol/L   Potassium 3.5 3.5 - 5.1 mmol/L   Chloride 102 98 - 111 mmol/L   CO2 23 22 - 32 mmol/L   Glucose, Bld 201 (H) 70 - 99 mg/dL    Comment: Glucose reference range applies only to samples taken after fasting for at least 8 hours.   BUN 7 6 - 20 mg/dL   Creatinine, Ser 0.64 0.44 - 1.00 mg/dL   Calcium 9.2 8.9 - 10.3 mg/dL   Total Protein 7.8 6.5 - 8.1 g/dL   Albumin 3.6 3.5 - 5.0 g/dL   AST 16 15 - 41 U/L   ALT 22 0 - 44 U/L   Alkaline Phosphatase 85 38 - 126 U/L   Total Bilirubin 0.3 0.3 - 1.2 mg/dL   GFR calc non Af Amer >60  >60 mL/min   GFR calc Af Amer >60 >60 mL/min   Anion gap 11 5 - 15    Comment: Performed at [facility], [place] [address] [place], [place] [address]  CBC     Status: Abnormal   Collection Time: 07/12/20  6:37 PM  Result Value Ref Range   WBC 17.0 (H) 4.0 - 10.5 K/uL   RBC 4.82 3.87 - 5.11 MIL/uL   Hemoglobin 12.1 12.0 - 15.0 g/dL   HCT 39.1 36 - 46 %   MCV 81.1 80.0 - 100.0 fL   MCH 25.1 (L) 26.0 - 34.0 pg   MCHC 30.9 30.0 - 36.0 g/dL   RDW 13.8 11.5 - 15.5 %   Platelets 377 150 - 400 K/uL   nRBC 0.0 0.0 - 0.2 %    Comment: Performed at [facility], [place] [address], [place], [place] [address]  I-Stat beta hCG blood, ED     Status: None   Collection Time: 07/12/20  6:51 PM  Result Value Ref Range   I-stat hCG, quantitative <5.0 <5 mIU/mL   Comment 3            Comment:   GEST. AGE      CONC.  (mIU/mL)   <=1 WEEK        5 - 50     2 WEEKS       50 - 500     3 WEEKS       100 - 10,000     4 WEEKS     1,000 - 30,000        FEMALE AND NON-PREGNANT FEMALE:     LESS THAN 5 mIU/mL   Lipase, blood     Status: None   Collection Time: 07/13/20  1:49 PM  Result Value Ref Range   Lipase 28 11 - 51 U/L    Comment: Performed at [facility],  [place], [place] [address]  Comprehensive metabolic panel     Status: Abnormal   Collection Time: 07/13/20  1:49 PM  Result Value Ref Range   Sodium 134 (L) 135 - 145 mmol/L   Potassium 3.7 3.5 - 5.1 mmol/L   Chloride 101 98 - 111 mmol/L   CO2 22 22 - 32 mmol/L   Glucose, Bld 236 (H) 70 - 99 mg/dL    Comment: Glucose reference range applies only to samples taken after fasting for at least 8 hours.   BUN 6 6 - 20 mg/dL   Creatinine, Ser 0.67 0.44 - 1.00 mg/dL   Calcium 9.0 8.9 - 10.3 mg/dL   Total Protein 8.6 (H) 6.5 - 8.1 g/dL   Albumin 4.0 3.5 - 5.0 g/dL   AST 22 15 - 41 U/L   ALT 23 0 - 44 U/L   Alkaline Phosphatase 84 38 - 126 U/L   Total Bilirubin 0.6 0.3 - 1.2 mg/dL   GFR calc non Af Amer >60 >60  mL/min   GFR calc Af Amer >60 >60 mL/min   Anion gap 11 5 - 15    Comment: Performed at [facility], [address] [place], [place] [address]  CBC     Status: Abnormal   Collection Time: 07/13/20  1:49 PM  Result Value Ref Range   WBC 17.7 (H) 4.0 - 10.5 K/uL   RBC 4.93 3.87 - 5.11 MIL/uL   Hemoglobin 12.8 12.0 - 15.0 g/dL   HCT 38.8 36 - 46 %   MCV 78.7 (L) 80.0 - 100.0 fL   MCH 26.0 26.0 - 34.0 pg   MCHC 33.0 30.0 - 36.0 g/dL   RDW 13.8 11.5 - 15.5 %   Platelets 394 150 - 400 K/uL   nRBC 0.0 0.0 - 0.2 %    Comment: Performed at [facility], [address] [place], [place] [address]  Urinalysis, Complete w Microscopic Urine, Random     Status: Abnormal   Collection Time: 07/13/20  1:49 PM  Result Value Ref Range   Color, Urine YELLOW (A) YELLOW   APPearance HAZY (A) CLEAR   Specific Gravity, Urine 1.015 1.005 - 1.030   pH 7.0 5.0 - 8.0   Glucose, UA 50 (A) NEGATIVE mg/dL   Hgb urine dipstick MODERATE (A) NEGATIVE   Bilirubin Urine NEGATIVE NEGATIVE   Ketones, ur NEGATIVE NEGATIVE mg/dL   Protein, ur NEGATIVE NEGATIVE mg/dL   Nitrite NEGATIVE NEGATIVE   Leukocytes,Ua NEGATIVE NEGATIVE   RBC / HPF 0-5 0 - 5 RBC/hpf   WBC, UA 0-5 0 - 5 WBC/hpf   Bacteria, UA RARE (A) NONE SEEN   Squamous Epithelial / LPF 0-5 0 - 5   Amorphous Crystal PRESENT     Comment: Performed at [facility], [address] [place],  [address]  hCG, quantitative, pregnancy     Status: None   Collection Time: 07/13/20  1:49 PM  Result Value Ref Range   hCG, Beta Chain, Quant, S 1 <5 mIU/mL    Comment:          GEST. AGE      CONC.  (mIU/mL)   <=1 WEEK        5 - 50     2 WEEKS       50 - 500     3 WEEKS       100 - 10,000     4 WEEKS  1,000 - 30,000     5 WEEKS     3,500 - 115,000   6-8 WEEKS     12,000 - 270,000    12 WEEKS     15,000 - 220,000        FEMALE AND NON-PREGNANT FEMALE:     LESS THAN 5 mIU/mL Performed at [facility], [address] [place], [place] [address]   SARS Coronavirus 2 by RT PCR (hospital order, performed in [facility] hospital lab) Nasopharyngeal Nasopharyngeal Swab     Status: None   Collection Time: 07/13/20  4:44 PM   Specimen: Nasopharyngeal Swab  Result Value Ref Range   SARS Coronavirus 2 NEGATIVE NEGATIVE    Comment: (NOTE) SARS-CoV-2 target nucleic acids are NOT DETECTED.  The SARS-CoV-2 RNA is generally detectable in upper and lower respiratory specimens during the acute phase of infection. The lowest concentration of SARS-CoV-2 viral copies this assay can detect is 250 copies / mL. A negative result does not preclude SARS-CoV-2 infection and should not be used as the sole basis for treatment or other patient management decisions.  A negative result may occur with improper specimen collection / handling, submission of specimen other than nasopharyngeal swab, presence of viral mutation(s) within the areas targeted by this assay, and inadequate number of viral copies (<250 copies / mL). A negative result must be combined with clinical observations, patient history, and epidemiological information.  Fact Sheet for Patients:   StrictlyIdeas.no  Fact Sheet for Healthcare Providers: BankingDealers.co.za  This test is not yet approved or  cleared by the [place] FDA and has been authorized for detection and/or diagnosis of SARS-CoV-2 by FDA under an Emergency Use Authorization (EUA).  This EUA will remain in effect (meaning this test can be used) for the duration of the COVID-19 declaration under Section 564(b)(1) of the Act, 21 U.S.C. section 360bbb-3(b)(1), unless the authorization is terminated or revoked sooner.  Performed at [facility], [address] [place], [place] [address]       PHQ2/9: Depression screen [facility] 2/9 08/15/2020 07/13/2020 06/30/2020 05/30/2020 11/12/2019  Decreased Interest 0 0 0 0 0  Down,  Depressed, Hopeless 0 0 0 0 0  PHQ - 2 Score 0 0 0 0 0  Altered sleeping - - 0 0 0  Tired, decreased energy - - 0 0 0  Change in appetite - - 0 0 0  Feeling bad or failure about yourself  - - 0 0 0  Trouble concentrating - - 0 0 0  Moving slowly or fidgety/restless - - 0 0 0  Suicidal thoughts - - 0 0 0  PHQ-9 Score - - 0 0 0  Difficult doing work/chores - - - - -  Some recent data might be hidden    phq 9 is negative   Fall Risk: Fall Risk  08/15/2020 07/13/2020 06/30/2020 05/30/2020 05/03/2020  Falls in the past year? 1 1 1  0 0  Number falls in past yr: 1 1 0 0 -  Injury with Fall? 1 1 1  0 -  Comment - - - - -  Risk for fall due to : History of fall(s) - - - -  Follow up - Falls evaluation completed - - -     Functional Status Survey: Is the patient deaf or have difficulty hearing?: No Does the patient have difficulty seeing, even when wearing glasses/contacts?: No Does the patient have difficulty concentrating, remembering, or making decisions?: No Does the patient  have difficulty walking or climbing stairs?: No Does the patient have difficulty dressing or bathing?: No Does the patient have difficulty doing errands alone such as visiting a doctor's office or shopping?: No    Assessment & Plan  1. Type 2 diabetes mellitus with microalbuminuria, with long-term current use of insulin (HCC)  A1C is below 8 % , may proceed to surgery   2. Pre-operative clearance  Surgery was on hold because of uncontrolled DM, but A1C is at goal today   3. Leukocytosis, unspecified type  She does not want to do any further testing   4. Tachycardia  Seeing by Cardiologist, cannot see results of holter, sending Dr. Mylo Red a message

## 2020-09-07 ENCOUNTER — Other Ambulatory Visit: Payer: Self-pay | Admitting: Family Medicine

## 2020-09-07 DIAGNOSIS — Z1231 Encounter for screening mammogram for malignant neoplasm of breast: Secondary | ICD-10-CM

## 2020-09-08 ENCOUNTER — Ambulatory Visit
Admission: RE | Admit: 2020-09-08 | Discharge: 2020-09-08 | Disposition: A | Discharge status: Home / Self Care | Payer: 59 | Source: Ambulatory Visit | Attending: Family Medicine | Admitting: Family Medicine

## 2020-09-08 ENCOUNTER — Encounter: Payer: Self-pay | Admitting: Family Medicine

## 2020-09-08 ENCOUNTER — Other Ambulatory Visit: Payer: Self-pay

## 2020-09-08 DIAGNOSIS — Z1231 Encounter for screening mammogram for malignant neoplasm of breast: Secondary | ICD-10-CM | POA: Diagnosis not present

## 2020-09-28 ENCOUNTER — Other Ambulatory Visit: Payer: Self-pay | Admitting: Internal Medicine

## 2020-09-28 DIAGNOSIS — E1129 Type 2 diabetes mellitus with other diabetic kidney complication: Secondary | ICD-10-CM

## 2020-09-28 DIAGNOSIS — E1169 Type 2 diabetes mellitus with other specified complication: Secondary | ICD-10-CM

## 2020-09-28 NOTE — Patient Instructions (Addendum)
DUE TO COVID-19 ONLY ONE VISITOR IS ALLOWED TO COME WITH YOU AND STAY IN THE WAITING ROOM ONLY DURING PRE OP AND PROCEDURE DAY OF SURGERY. THE 1 VISITOR  MAY VISIT WITH YOU AFTER SURGERY IN YOUR PRIVATE ROOM DURING VISITING HOURS ONLY!  YOU NEED TO HAVE A COVID 19 TEST ON_12/4______ @10 :05_______, THIS TEST MUST BE DONE BEFORE SURGERY,  COVID TESTING SITE [place] [id number], IT IS ON THE RIGHT GOING OUT [address] APPROXIMATELY  2 MINUTES PAST [employer] ON THE RIGHT. ONCE YOUR COVID TEST IS COMPLETED,  PLEASE BEGIN THE QUARANTINE INSTRUCTIONS AS OUTLINED IN YOUR HANDOUT.                Erin Good   Your procedure is scheduled on: 10/12/20   Report to [facility] Main  Entrance   Report to admitting at   6:30 AM     Call this number if you have problems the morning of surgery [phone]   . BRUSH YOUR TEETH MORNING OF SURGERY AND RINSE YOUR MOUTH OUT, NO CHEWING GUM CANDY OR MINTS.   No food after midnight.    You may have clear liquid until 5:30 AM.    At 4:00 AM drink pre surgery   drink. Nothing by mouth after 5:30 AM.   Take these medicines the morning of surgery with A SIP OF WATER: Clonazepam       How to Manage Your Diabetes Before and After Surgery  Why is it important to control my blood sugar before and after surgery? . Improving blood sugar levels before and after surgery helps healing and can limit problems. . A way of improving blood sugar control is eating a healthy diet by: o  Eating less sugar and carbohydrates o  Increasing activity/exercise o  Talking with your doctor about reaching your blood sugar goals . High blood sugars (greater than 180 mg/dL) can raise your risk of infections and slow your recovery, so you will need to focus on controlling your diabetes during the weeks before surgery. . Make sure that the doctor who takes care of your diabetes knows about your planned surgery including the  date and location.  How do I manage my blood sugar before surgery? . Check your blood sugar at least 4 times a day, starting 2 days before surgery, to make sure that the level is not too high or low. o Check your blood sugar the morning of your surgery when you wake up and every 2 hours until you get to the Short Stay unit. . If your blood sugar is less than 70 mg/dL, you will need to treat for low blood sugar: o Do not take insulin. o Treat a low blood sugar (less than 70 mg/dL) with  cup of clear juice (cranberry or apple), 4 glucose tablets, OR glucose gel. o Recheck blood sugar in 15 minutes after treatment (to make sure it is greater than 70 mg/dL). If your blood sugar is not greater than 70 mg/dL on recheck, call [phone] for further instructions. . Report your blood sugar to the short stay nurse when you get to Short Stay.  . If you are admitted to the hospital after surgery: o Your blood sugar will be checked by the staff and you will probably be given insulin after surgery (instead of oral diabetes medicines) to make sure you have good blood sugar levels. o The goal for blood sugar control after surgery is 80-180 mg/dL.  WHAT DO I DO ABOUT MY DIABETES MEDICATION?  Marland Kitchen Do not take oral diabetes medicines (pills) the morning of surgery.  . THE NIGHT BEFORE SURGERY, take 27 units of  Lantus   insulin.       . THE MORNING OF SURGERY, take 0   units of  insulin.  . The day of surgery, do not take other diabetes injectables, including Byetta (exenatide), Bydureon (exenatide ER), Victoza (liraglutide), or Trulicity (dulaglutide).  .   You may not have any metal on your body including hair pins and              piercings  Do not wear jewelry, make-up, lotions, powders or perfumes, deodorant             Do not wear nail polish on your fingernails.  Do not shave  48 hours prior to surgery.           Do not bring valuables to the hospital. [place].  Contacts, dentures or bridgework may not be worn into surgery.       Patients discharged the day of surgery will not be allowed to drive home.   IF YOU ARE HAVING SURGERY AND GOING HOME THE SAME DAY, YOU MUST HAVE AN ADULT TO DRIVE YOU HOME AND BE WITH YOU FOR 24 HOURS.  YOU MAY GO HOME BY TAXI OR UBER OR ORTHERWISE, BUT AN ADULT MUST ACCOMPANY YOU HOME AND STAY WITH YOU FOR 24 HOURS.  Name and phone number of your driver:  Special Instructions: N/A              Please read over the following fact sheets you were given: _____________________________________________________________________             [facility]- Preparing for Total Shoulder Arthroplasty    Before surgery, you can play an important role. Because skin is not sterile, your skin needs to be as free of germs as possible. You can reduce the number of germs on your skin by using the following products. . Benzoyl Peroxide Gel o Reduces the number of germs present on the skin o Applied twice a day to shoulder area starting two days before surgery    ==================================================================  Please follow these instructions carefully:  BENZOYL PEROXIDE 5% GEL  Please do not use if you have an allergy to benzoyl peroxide.   If your skin becomes reddened/irritated stop using the benzoyl peroxide.  Starting two days before surgery, apply as follows: 1. Apply benzoyl peroxide in the morning and at night. Apply after taking a shower. If you are not taking a shower clean entire shoulder front, back, and side along with the armpit with a clean wet washcloth.  2. Place a quarter-sized dollop on your shoulder and rub in thoroughly, making sure to cover the front, back, and side of your shoulder, along with the armpit.   2 days before ____ AM   ____ PM              1 day before ____ AM   ____ PM                         3. Do this twice a day for two days.  (Last application  is the night before surgery, AFTER using the CHG soap as described below).  4. Do NOT  apply benzoyl peroxide gel on the day of surgery.   [place] - Preparing for Surgery Before surgery, you can play an important role.   Because skin is not sterile, your skin needs to be as free of germs as possible.   You can reduce the number of germs on your skin by washing with CHG (chlorahexidine gluconate) soap before surgery.   CHG is an antiseptic cleaner which kills germs and bonds with the skin to continue killing germs even after washing. Please DO NOT use if you have an allergy to CHG or antibacterial soaps.   If your skin becomes reddened/irritated stop using the CHG and inform your nurse when you arrive at Short Stay. Do not shave (including legs and underarms) for at least 48 hours prior to the first CHG shower.    Please follow these instructions carefully:  1.  Shower with CHG Soap the night before surgery and the  morning of Surgery.  2.  If you choose to wash your hair, wash your hair first as usual with your  normal  shampoo.  3.  After you shampoo, rinse your hair and body thoroughly to remove the  shampoo.                                        4.  Use CHG as you would any other liquid soap.  You can apply chg directly  to the skin and wash                       Gently with a scrungie or clean washcloth.  5.  Apply the CHG Soap to your body ONLY FROM THE NECK DOWN.   Do not use on face/ open                           Wound or open sores. Avoid contact with eyes, ears mouth and genitals (private parts).                       Wash face,  Genitals (private parts) with your normal soap.             6.  Wash thoroughly, paying special attention to the area where your surgery  will be performed.  7.  Thoroughly rinse your body with warm water from the neck down.  8.  DO NOT shower/wash with your normal soap after using and rinsing off  the CHG Soap.             9.  Pat yourself dry with a  clean towel.            10.  Wear clean pajamas.            11.  Place clean sheets on your bed the night of your first shower and do not  sleep with pets. Day of Surgery : Do not apply any lotions/deodorants the morning of surgery.  Please wear clean clothes to the hospital/surgery center.  FAILURE TO FOLLOW THESE INSTRUCTIONS MAY RESULT IN THE CANCELLATION OF YOUR SURGERY PATIENT SIGNATURE_________________________________  NURSE SIGNATURE__________________________________  ________________________________________________________________________   Adam Phenix  An incentive spirometer is a tool that can help keep your lungs clear and active. This tool measures how well you are filling your lungs with each breath. Taking long deep  breaths may help reverse or decrease the chance of developing breathing (pulmonary) problems (especially infection) following:  A long period of time when you are unable to move or be active. BEFORE THE PROCEDURE   If the spirometer includes an indicator to show your best effort, your nurse or respiratory therapist will set it to a desired goal.  If possible, sit up straight or lean slightly forward. Try not to slouch.  Hold the incentive spirometer in an upright position. INSTRUCTIONS FOR USE  1. Sit on the edge of your bed if possible, or sit up as far as you can in bed or on a chair. 2. Hold the incentive spirometer in an upright position. 3. Breathe out normally. 4. Place the mouthpiece in your mouth and seal your lips tightly around it. 5. Breathe in slowly and as deeply as possible, raising the piston or the ball toward the top of the column. 6. Hold your breath for 3-5 seconds or for as long as possible. Allow the piston or ball to fall to the bottom of the column. 7. Remove the mouthpiece from your mouth and breathe out normally. 8. Rest for a few seconds and repeat Steps 1 through 7 at least 10 times every 1-2 hours when you are awake.  Take your time and take a few normal breaths between deep breaths. 9. The spirometer may include an indicator to show your best effort. Use the indicator as a goal to work toward during each repetition. 10. After each set of 10 deep breaths, practice coughing to be sure your lungs are clear. If you have an incision (the cut made at the time of surgery), support your incision when coughing by placing a pillow or rolled up towels firmly against it. Once you are able to get out of bed, walk around indoors and cough well. You may stop using the incentive spirometer when instructed by your caregiver.  RISKS AND COMPLICATIONS  Take your time so you do not get dizzy or light-headed.  If you are in pain, you may need to take or ask for pain medication before doing incentive spirometry. It is harder to take a deep breath if you are having pain. AFTER USE  Rest and breathe slowly and easily.  It can be helpful to keep track of a log of your progress. Your caregiver can provide you with a simple table to help with this. If you are using the spirometer at home, follow these instructions: [place] IF:   You are having difficultly using the spirometer.  You have trouble using the spirometer as often as instructed.  Your pain medication is not giving enough relief while using the spirometer.  You develop fever of 100.5 F (38.1 C) or higher. SEEK IMMEDIATE MEDICAL CARE IF:   You cough up bloody sputum that had not been present before.  You develop fever of 102 F (38.9 C) or greater.  You develop worsening pain at or near the incision site. MAKE SURE YOU:   Understand these instructions.  Will watch your condition.  Will get help right away if you are not doing well or get worse. Document Released: 03/04/2007 Document Revised: 01/14/2012 Document Reviewed: 05/05/2007 [facility] Patient Information 2014 [place],  [place].   ________________________________________________________________________

## 2020-10-03 ENCOUNTER — Encounter (HOSPITAL_COMMUNITY): Payer: Self-pay

## 2020-10-03 ENCOUNTER — Encounter (HOSPITAL_COMMUNITY)
Admission: RE | Admit: 2020-10-03 | Discharge: 2020-10-03 | Disposition: A | Discharge status: Home / Self Care | Payer: 59 | Source: Ambulatory Visit | Attending: Orthopaedic Surgery | Admitting: Orthopaedic Surgery

## 2020-10-03 ENCOUNTER — Other Ambulatory Visit: Payer: Self-pay

## 2020-10-03 DIAGNOSIS — Z01812 Encounter for preprocedural laboratory examination: Secondary | ICD-10-CM | POA: Diagnosis not present

## 2020-10-03 HISTORY — DX: Essential (primary) hypertension: I10

## 2020-10-03 LAB — BASIC METABOLIC PANEL
Anion gap: 10 (ref 5–15)
BUN: 10 mg/dL (ref 6–20)
CO2: 21 mmol/L — ABNORMAL LOW (ref 22–32)
Calcium: 9.1 mg/dL (ref 8.9–10.3)
Chloride: 106 mmol/L (ref 98–111)
Creatinine, Ser: 0.61 mg/dL (ref 0.44–1.00)
GFR, Estimated: >60 mL/min (ref >60)
Glucose, Bld: 199 mg/dL — ABNORMAL HIGH (ref 70–99)
Potassium: 4.1 mmol/L (ref 3.5–5.1)
Sodium: 137 mmol/L (ref 135–145)

## 2020-10-03 LAB — CBC
HCT: 38.3 % (ref 36.0–46.0)
Hemoglobin: 12.7 g/dL (ref 12.0–15.0)
MCH: 26.5 pg (ref 26.0–34.0)
MCHC: 33.2 g/dL (ref 30.0–36.0)
MCV: 79.8 fL — ABNORMAL LOW (ref 80.0–100.0)
Platelets: 330 10*3/uL (ref 150–400)
RBC: 4.8 MIL/uL (ref 3.87–5.11)
RDW: 14.1 % (ref 11.5–15.5)
WBC: 17.1 10*3/uL — ABNORMAL HIGH (ref 4.0–10.5)
nRBC: 0 % (ref 0.0–0.2)

## 2020-10-03 LAB — GLUCOSE, CAPILLARY: Glucose-Capillary: 196 mg/dL — ABNORMAL HIGH (ref 70–99)

## 2020-10-03 LAB — HEMOGLOBIN A1C
Hgb A1c MFr Bld: 7.6 % — ABNORMAL HIGH (ref 4.8–5.6)
Mean Plasma Glucose: 171.42 mg/dL

## 2020-10-03 NOTE — Progress Notes (Signed)
COVID Vaccine Completed:Yes Date COVID Vaccine completed:02/20/20 COVID vaccine manufacturer: [place]    PCP - dr. Raliegh Ip. [place] Cardiologist - Dr. Garen Lah  Chest x-ray - 03/08/20-Epic EKG - 07/01/20-Epic Stress Test - no ECHO - no Cardiac Cath - no Pacemaker/ICD device last checked:NA  Sleep Study -  Yes CPAP - has not been using it for a year  Fasting Blood Sugar - 130-150 Checks Blood Sugar _TID____ times a day  Blood Thinner Instructions:NA Aspirin Instructions: Last Dose:  Anesthesia review:   Patient denies shortness of breath, fever, cough and chest pain at PAT appointment yes   Patient verbalized understanding of instructions that were given to them at the PAT appointment. Patient was also instructed that they will need to review over the PAT instructions again at home before surgery. Yes Pt reports that she doesn't get SOB with any activities. Her BMI is 48.8. she has not had a seizure in 10 years and has medication to take PRN if she has an aura.

## 2020-10-05 IMAGING — DX DG CHEST 2V
2 series · 2 of 2 positions shown · non-contrast
Comparison: Radiographs 06/07/2016.

CLINICAL DATA: Fever and cough for 3 days.

EXAM:
CHEST - 2 VIEW

[chest pa]
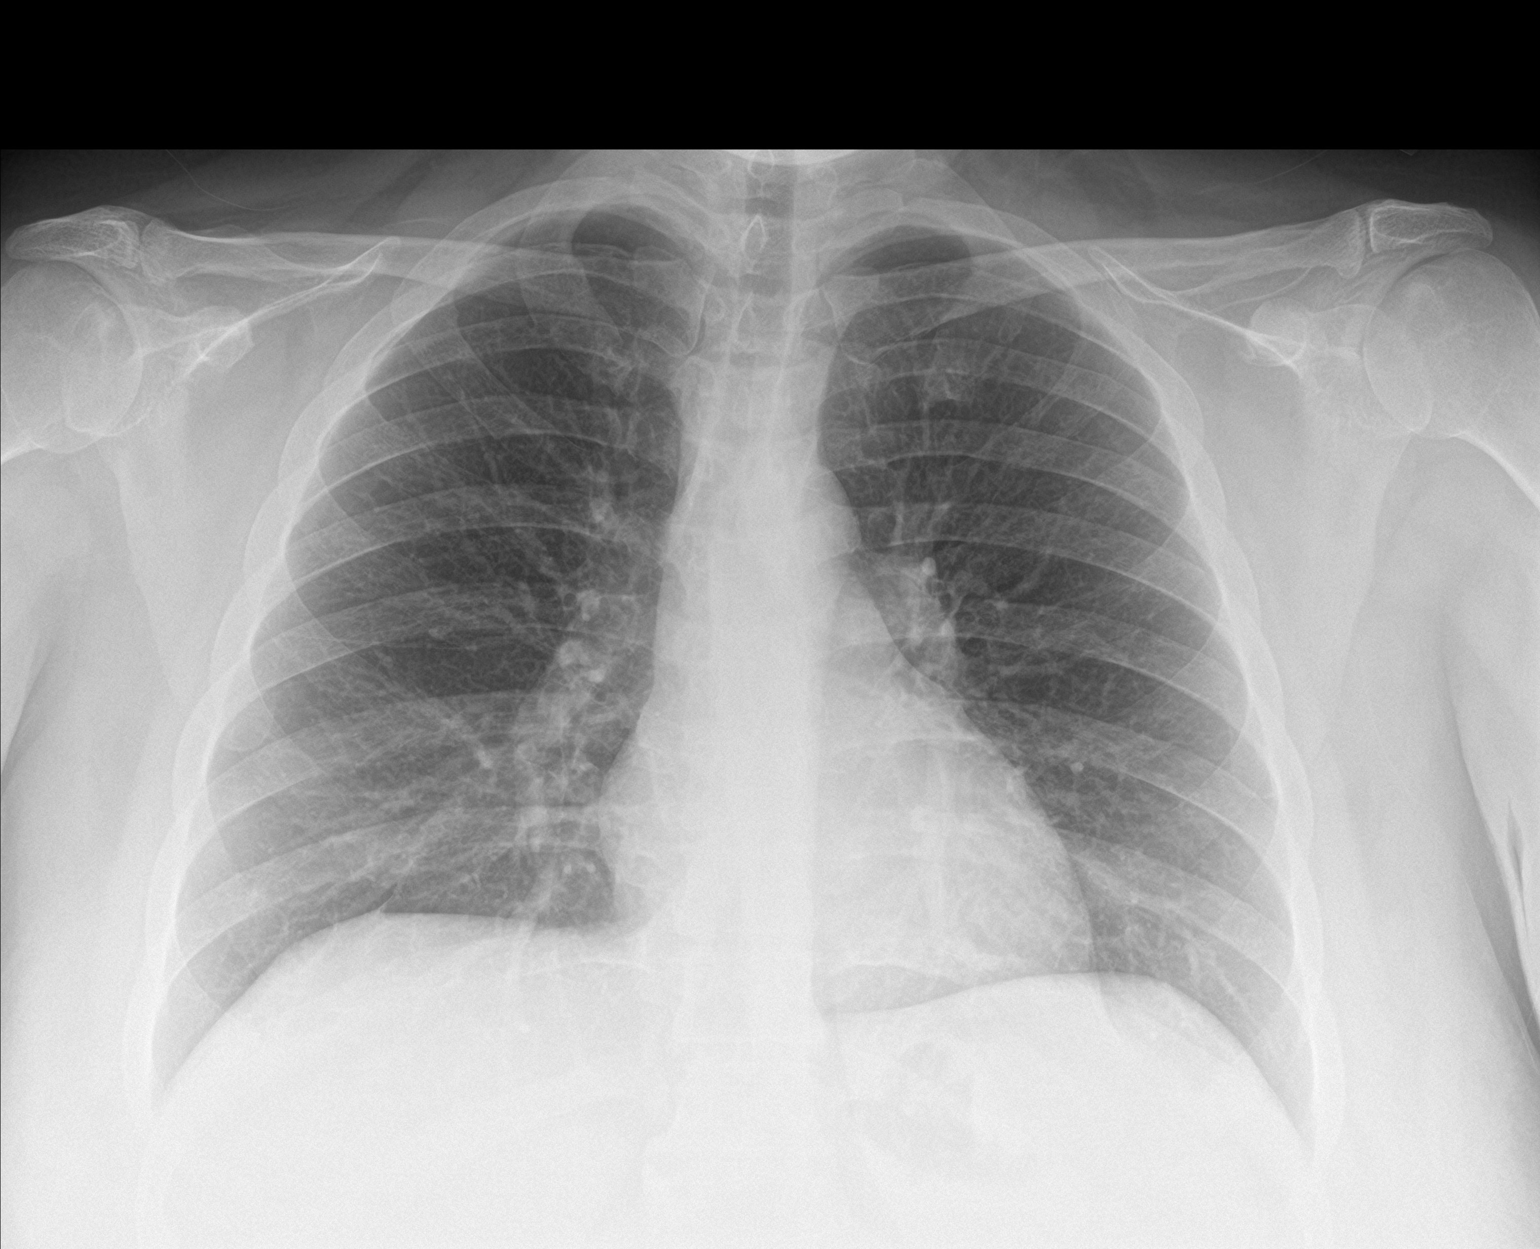

[chest lat]
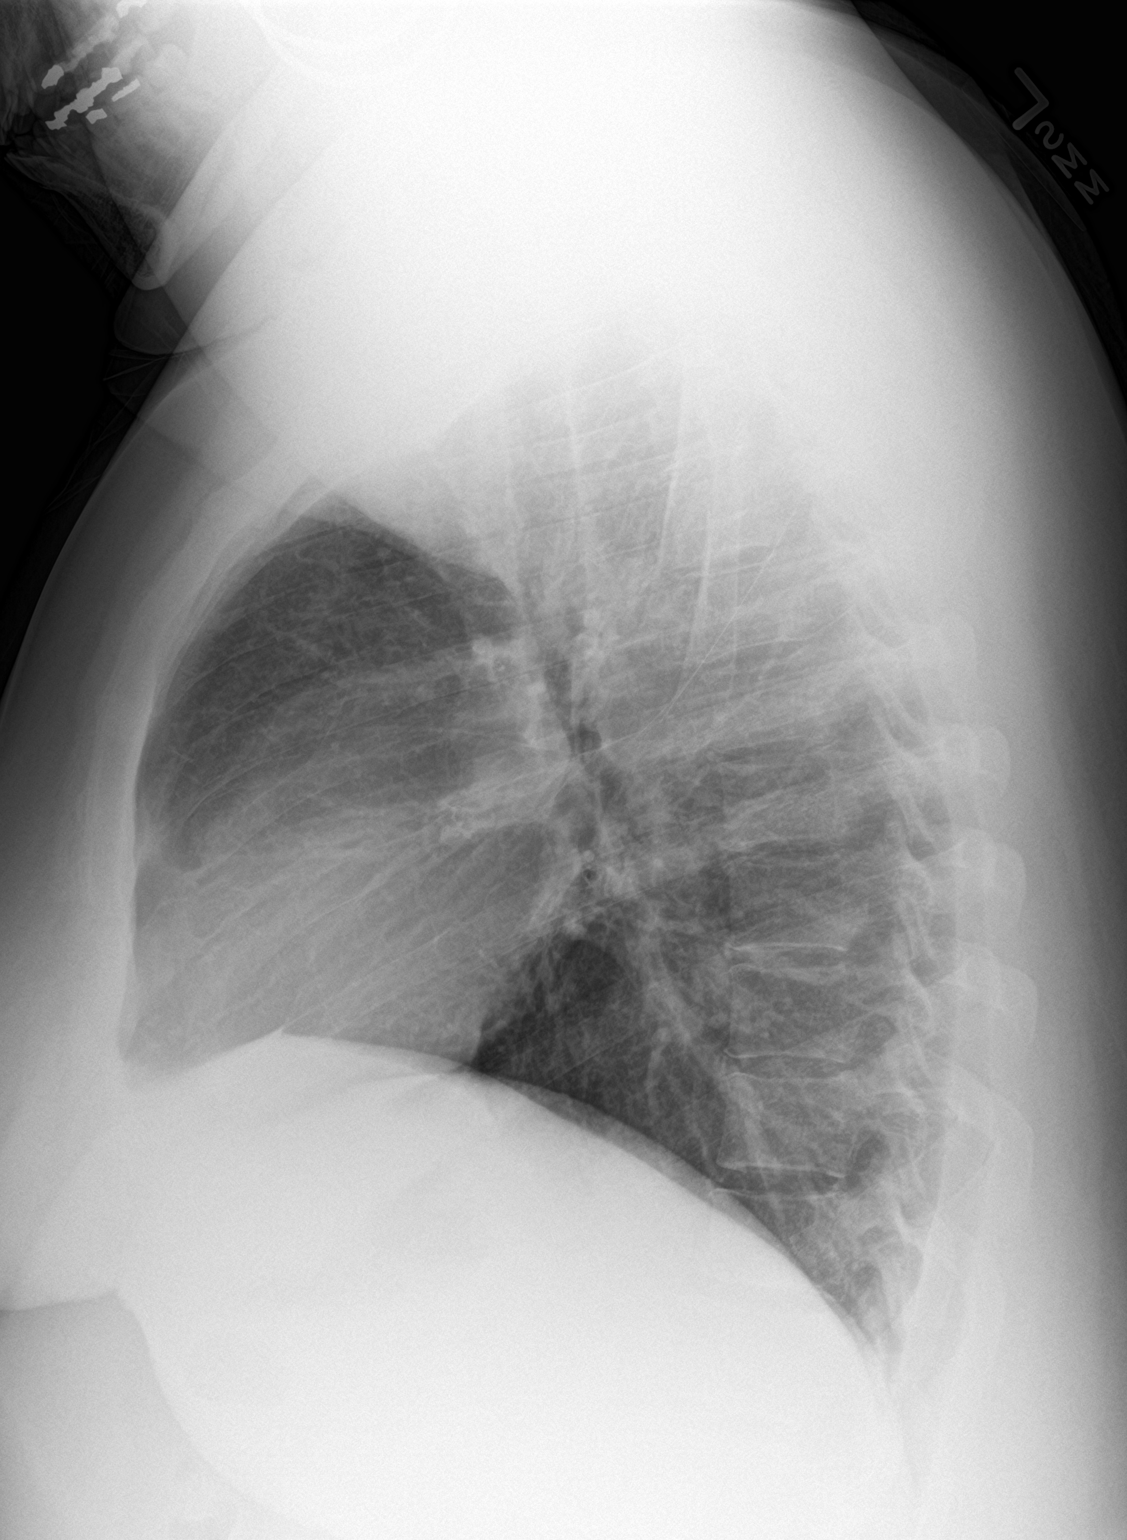

[2 of 2 positions shown; findings below may reference images not displayed]

FINDINGS: The heart size and mediastinal contours are normal. The lungs are
clear. There is no pleural effusion or pneumothorax. No acute
osseous findings are identified.
IMPRESSION: Stable chest.  No active cardiopulmonary process.

## 2020-10-08 ENCOUNTER — Other Ambulatory Visit (HOSPITAL_COMMUNITY)
Admission: RE | Admit: 2020-10-08 | Discharge: 2020-10-08 | Disposition: A | Discharge status: Home / Self Care | Payer: 59 | Source: Ambulatory Visit | Attending: Orthopaedic Surgery | Admitting: Orthopaedic Surgery

## 2020-10-08 DIAGNOSIS — Z01812 Encounter for preprocedural laboratory examination: Secondary | ICD-10-CM | POA: Insufficient documentation

## 2020-10-08 DIAGNOSIS — Z20822 Contact with and (suspected) exposure to covid-19: Secondary | ICD-10-CM | POA: Diagnosis not present

## 2020-10-08 LAB — SARS CORONAVIRUS 2 (TAT 6-24 HRS): SARS Coronavirus 2: NEGATIVE

## 2020-10-11 MED ORDER — DEXTROSE 5 % IV SOLN
3.0000 g | INTRAVENOUS | Status: AC
  Administered 2020-10-12: 3 g via INTRAVENOUS
  Filled 2020-10-11: qty 3

## 2020-10-11 NOTE — Anesthesia Preprocedure Evaluation (Addendum)
Anesthesia Evaluation  Patient identified by MRN, date of birth, ID band Patient awake    Reviewed: Allergy & Precautions, NPO status , Patient's Chart, lab work & pertinent test results  Airway Mallampati: II  TM Distance: >3 FB Neck ROM: Full    Dental no notable dental hx. (+) Teeth Intact, Dental Advisory Given   Pulmonary sleep apnea (does not use CPAP) ,    Pulmonary exam normal breath sounds clear to auscultation       Cardiovascular hypertension, Pt. on medications and Pt. on home beta blockers Normal cardiovascular exam Rhythm:Regular Rate:Normal     Neuro/Psych Seizures -, Well Controlled,  PSYCHIATRIC DISORDERS Anxiety Depression    GI/Hepatic Neg liver ROS, GERD  Controlled,  Endo/Other  diabetes, Oral Hypoglycemic Agents, Insulin DependentMorbid obesity (BMI 49)  Renal/GU negative Renal ROS  negative genitourinary   Musculoskeletal negative musculoskeletal ROS (+)   Abdominal   Peds  Hematology negative hematology ROS (+)   Anesthesia Other Findings   Reproductive/Obstetrics                            Anesthesia Physical Anesthesia Plan  ASA: III  Anesthesia Plan: General and Regional   Post-op Pain Management:  Regional for Post-op pain   Induction: Intravenous  PONV Risk Score and Plan: 3 and Midazolam, Dexamethasone and Ondansetron  Airway Management Planned: Oral ETT  Additional Equipment:   Intra-op Plan:   Post-operative Plan: Extubation in OR  Informed Consent: I have reviewed the patients History and Physical, chart, labs and discussed the procedure including the risks, benefits and alternatives for the proposed anesthesia with the patient or authorized representative who has indicated his/her understanding and acceptance.     Dental advisory given  Plan Discussed with: CRNA  Anesthesia Plan Comments:         Anesthesia Quick Evaluation

## 2020-10-11 NOTE — H&P (Signed)
PREOPERATIVE H&P  Chief Complaint: LEFT SHOULDER CARTILADGE DISORDER,ADHESIVE CAPSULITIS,ROTATOR CUFF TEAR,BICEP TENDONITIS,OSSTEOARTHRITIS  HPI: Erin Good is a 44 y.o. female who is scheduled for, Procedure(s): IRRIGATION AND DEBRIDEMENT SHOULDER SHOULDER ARTHROSCOPY WITH ROTATOR CUFF REPAIR BICEPS TENODESIS SHOULDER ARTHROSCOPY WITH SUBACROMIAL DECOMPRESSION AND DISTAL CLAVICLE EXCISION.   Patient has a past medical history significant for HTN, sleep apnea, diabetes type 2, hyperlipidemia.   Erin Good is a 44 year-old female has had left shoulder pain for over 6 moths. The pain is associated with loss of range of motion, pain, weakness and popping.  She saw a doctor at [facility] in [place] who did an injection and it didn't help at all.  She was given a full home exercise program.  She has been trying to get into physical therapy, but has not been able to do that, but she is getting worse.  She has 8/10 pain.  She is unable to lift her arm up over her head.  She is unable to comb her hair or put her bra on.  She is getting popping and weakness all of the time.  She has been taking Meloxicam, Methocarbamol and Tramadol without any improvement.  She cannot lie on it and it hurts to have it hanging down.    Her symptoms are rated as moderate to severe, and have been worsening.  This is significantly impairing activities of daily living.    Please see clinic note for further details on this patient's care.    She has elected for surgical management.   Past Medical History:  Diagnosis Date  . Allergic rhinitis   . Breast discharge 06/05/2017   2 weeks ago left  . Breast mass 12/06/2016   left  . Diabetes mellitus without complication ([place])   . Dyslipidemia   . Epilepsy ([place])   . Epilepsy ([place])   . Febrile seizures ([place])   . Galactorrhea   . Hx gestational diabetes   . Hypertension   . Kidney stones   . Obesity   . Seizures ([place])   . Sleep apnea   . Syncope  and collapse   . Tachycardia    Past Surgical History:  Procedure Laterality Date  . BREAST BIOPSY Left 2018   benign  . CESAREAN SECTION     X 2  . RIGHT OOPHORECTOMY Right 2001   benign tumor  . TUBAL LIGATION  2007   Social History   Socioeconomic History  . Marital status: Married    Spouse name: Roderic Palau  . Number of children: 2  . Years of education: College  . Highest education level: Not on file  Occupational History  . Occupation: Optometrist  Tobacco Use  . Smoking status: Never Smoker  . Smokeless tobacco: Never Used  Vaping Use  . Vaping Use: Never used  Substance and Sexual Activity  . Alcohol use: Yes    Comment: occass  . Drug use: No  . Sexual activity: Yes    Partners: Male    Birth control/protection: Surgical    Comment: tubial lig  Other Topics Concern  . Not on file  Social History Narrative  . Not on file   Social Determinants of Health   Financial Resource Strain: Low Risk   . Difficulty of Paying Living Expenses: Not hard at all  Food Insecurity: No Food Insecurity  . Worried About Charity fundraiser in the Last Year: Never true  . Ran Out of Food in the Last Year: Never  true  Transportation Needs: No Transportation Needs  . Lack of Transportation (Medical): No  . Lack of Transportation (Non-Medical): No  Physical Activity:   . Days of Exercise per Week: Not on file  . Minutes of Exercise per Session: Not on file  Stress: No Stress Concern Present  . Feeling of Stress : Not at all  Social Connections: Moderately Isolated  . Frequency of Communication with Friends and Family: More than three times a week  . Frequency of Social Gatherings with Friends and Family: More than three times a week  . Attends Religious Services: Never  . Active Member of Clubs or Organizations: No  . Attends Archivist Meetings: Never  . Marital Status: Married   Family History  Problem Relation Age of Onset  . Diabetes Mother   . Breast  cancer Paternal Grandmother 51  . Cancer Paternal Grandmother   . Cancer Paternal Aunt   . Cancer Maternal Grandmother   . Heart disease Neg Hx    Allergies  Allergen Reactions  . Peanuts [Peanut Oil] Anaphylaxis  . Aspirin Other (See Comments)    Does not take because of her epilepsy/seizure    Prior to Admission medications   Medication Sig Start Date End Date Taking? Authorizing Provider  celecoxib (CELEBREX) 200 MG capsule Take 200 mg by mouth 2 (two) times daily as needed for mild pain.  06/07/20  Yes [provider]  clonazePAM (KLONOPIN) 0.5 MG tablet Take 0.5 mg by mouth daily as needed (Seizures).  08/14/16  Yes [provider]  diclofenac (VOLTAREN) 75 MG EC tablet Take 75 mg by mouth 2 (two) times daily as needed for moderate pain.   Yes [provider]  EPINEPHrine (EPIPEN 2-PAK) 0.3 mg/0.3 mL IJ SOAJ injection Inject 0.3 mg into the muscle as needed for anaphylaxis (for anaphylaxis).  06/14/14  Yes [provider]  fluticasone (FLONASE) 50 MCG/ACT nasal spray Place 2 sprays into both nostrils daily as needed for allergies ([place] allergies).  01/28/18  Yes [provider]  LANTUS SOLOSTAR 100 UNIT/ML Solostar Pen INJECT 50 UNITS INTO THE SKIN DAILY. Patient taking differently: Inject 56 Units into the skin at bedtime.  08/01/20  Yes Philemon Kingdom, MD  MAPAP 500 MG capsule Take 1,000 capsules by mouth every 8 (eight) hours as needed for pain.  06/07/20  Yes [provider]  meloxicam (MOBIC) 15 MG tablet Take 15 mg by mouth daily as needed for pain.  06/18/20  Yes [provider]  montelukast (SINGULAIR) 10 MG tablet TAKE 1 TABLET BY MOUTH EVERYDAY AT BEDTIME Patient taking differently: Take 10 mg by mouth at bedtime.  02/20/20  Yes Sowles, Drue Stager, MD  OZEMPIC, 1 MG/DOSE, 2 MG/1.5ML SOPN INJECT 1 MG INTO THE SKIN ONCE A WEEK. Patient taking differently: Inject 1 mg into the skin every Sunday.  02/16/20  Yes Philemon Kingdom, MD  OZEMPIC, 1 MG/DOSE, 4 MG/3ML SOPN Inject 1 mL into the skin once a week. 08/31/20  Yes [provider]  rosuvastatin (CRESTOR) 10 MG tablet Take 1 tablet (10 mg total) by mouth daily. 05/30/20  Yes Sowles, Drue Stager, MD  tiZANidine (ZANAFLEX) 2 MG tablet Take 2 mg by mouth at bedtime as needed for muscle spasms.  06/07/20  Yes [provider]  Vitamin D, Ergocalciferol, (DRISDOL) 1.25 MG (50000 UNIT) CAPS capsule TAKE 1 CAPSULE (50,000 UNITS TOTAL) BY MOUTH EVERY 7 (SEVEN) DAYS. Patient taking differently: Take 50,000 Units by mouth every Friday.  06/13/20  Yes Steele Sizer, MD  zonisamide (ZONEGRAN) 100 MG capsule Take 400 mg by mouth at bedtime.    Yes Roque Cash, MD  APAP-Pamabrom-Pyrilamine 500-25-15 MG TABS Take 2 tablets by mouth 3 (three) times daily as needed (Pelvic pain). Patient not taking: Reported on 08/15/2020 07/13/20   Cuthriell, Charline Bills, PA-C  atenolol (TENORMIN) 25 MG tablet TAKE 1 TABLET BY MOUTH EVERY DAY Patient not taking: Reported on 09/26/2020 06/22/20   Steele Sizer, MD  Continuous Blood Gluc Sensor (FREESTYLE LIBRE 2 SENSOR) MISC 1 each by Does not apply route every 14 (fourteen) days. Patient not taking: Reported on 08/15/2020 06/30/20   Steele Sizer, MD  Insulin Pen Needle 32G X 4 MM MISC Use 1x a day 07/03/19   Philemon Kingdom, MD  metFORMIN (GLUCOPHAGE-XR) 750 MG 24 hr tablet TAKE 2 TABLETS (1,500 MG TOTAL) BY MOUTH DAILY. 09/28/20   Philemon Kingdom, MD  ondansetron (ZOFRAN-ODT) 4 MG disintegrating tablet Take 1 tablet (4 mg total) by mouth every 8 (eight) hours as needed for nausea or vomiting. Patient not taking: Reported on 09/26/2020 07/13/20   Cuthriell, Charline Bills, PA-C  OneTouch Delica Lancets 24E MISC Use 2x a day with [employer] 07/03/19   Philemon Kingdom, MD  [facility] VERIO test strip USE 2X A DAY WITH ONETOUCH VERIO FLEX 08/01/20   Philemon Kingdom, MD    ROS: All other systems have been reviewed and were  otherwise negative with the exception of those mentioned in the HPI and as above.  Physical Exam: General: Alert, no acute distress Cardiovascular: No pedal edema Respiratory: No cyanosis, no use of accessory musculature GI: No organomegaly, abdomen is soft and non-tender Skin: No lesions in the area of chief complaint Neurologic: Sensation intact distally Psychiatric: Patient is competent for consent with normal mood and affect Lymphatic: No axillary or cervical lymphadenopathy  MUSCULOSKELETAL:  Left shoulder: Positive AC tenderness to palpation, impingement and O'Brien's.  Limited exam due to patient's pain.  4/5 supraspinatus strength.  Patient has active forward elevation to 90, passive to 120.  Imaging: MRI reviewed in the [facility] system which was limited by artifact, but appears to have a partial tear of the distal supraspinatus and possible upper subscapularis tear, fluid around the biceps and possibility of adhesive capsulitis.  Assessment: LEFT SHOULDER CARTILADGE DISORDER,ADHESIVE CAPSULITIS,ROTATOR CUFF TEAR,BICEP TENDONITIS,OSSTEOARTHRITIS  Plan: Plan for Procedure(s): IRRIGATION AND DEBRIDEMENT SHOULDER SHOULDER ARTHROSCOPY WITH ROTATOR CUFF REPAIR BICEPS TENODESIS SHOULDER ARTHROSCOPY WITH SUBACROMIAL DECOMPRESSION AND DISTAL CLAVICLE EXCISION  The risks benefits and alternatives were discussed with the patient including but not limited to the risks of nonoperative treatment, versus surgical intervention including infection, bleeding, nerve injury,  blood clots, cardiopulmonary complications, morbidity, mortality, among others, and they were willing to proceed.   The patient acknowledged the explanation, agreed to proceed with the plan and consent was signed.   Operative Plan: Left shoulder scope with subacromial decompression, distal clavicle excision, biceps tenodesis and possible capsular release with cuff repair Discharge Medications: Tylenol, Diclofenac, Oxycodone,  Zofran DVT Prophylaxis: None Physical Therapy: Outpatient PT Special Discharge needs: [place], PA-C  10/11/2020 6:58 AM

## 2020-10-12 ENCOUNTER — Encounter (HOSPITAL_COMMUNITY): Payer: Self-pay | Admitting: Orthopaedic Surgery

## 2020-10-12 ENCOUNTER — Ambulatory Visit (HOSPITAL_COMMUNITY): Payer: 59 | Admitting: Physician Assistant

## 2020-10-12 ENCOUNTER — Ambulatory Visit (HOSPITAL_COMMUNITY): Payer: 59 | Admitting: Anesthesiology

## 2020-10-12 ENCOUNTER — Ambulatory Visit (HOSPITAL_COMMUNITY)
Admission: RE | Admit: 2020-10-12 | Discharge: 2020-10-12 | Disposition: A | Discharge status: Home / Self Care | Payer: 59 | Attending: Orthopaedic Surgery | Admitting: Orthopaedic Surgery

## 2020-10-12 ENCOUNTER — Encounter (HOSPITAL_COMMUNITY)
Admission: RE | Disposition: A | Discharge status: Home / Self Care | Payer: Self-pay | Source: Home / Self Care | Attending: Orthopaedic Surgery

## 2020-10-12 DIAGNOSIS — Z791 Long term (current) use of non-steroidal anti-inflammatories (NSAID): Secondary | ICD-10-CM | POA: Diagnosis not present

## 2020-10-12 DIAGNOSIS — M7502 Adhesive capsulitis of left shoulder: Secondary | ICD-10-CM | POA: Diagnosis not present

## 2020-10-12 DIAGNOSIS — Z79899 Other long term (current) drug therapy: Secondary | ICD-10-CM | POA: Diagnosis not present

## 2020-10-12 DIAGNOSIS — Z90721 Acquired absence of ovaries, unilateral: Secondary | ICD-10-CM | POA: Insufficient documentation

## 2020-10-12 DIAGNOSIS — Z803 Family history of malignant neoplasm of breast: Secondary | ICD-10-CM | POA: Diagnosis not present

## 2020-10-12 DIAGNOSIS — M19012 Primary osteoarthritis, left shoulder: Secondary | ICD-10-CM | POA: Insufficient documentation

## 2020-10-12 DIAGNOSIS — Z794 Long term (current) use of insulin: Secondary | ICD-10-CM | POA: Insufficient documentation

## 2020-10-12 HISTORY — PX: IRRIGATION AND DEBRIDEMENT SHOULDER: SHX5880

## 2020-10-12 LAB — GLUCOSE, CAPILLARY: Glucose-Capillary: 187 mg/dL — ABNORMAL HIGH (ref 70–99)

## 2020-10-12 LAB — PREGNANCY, URINE: Preg Test, Ur: NEGATIVE

## 2020-10-12 SURGERY — IRRIGATION AND DEBRIDEMENT SHOULDER
Anesthesia: Regional | Site: Shoulder | Laterality: Left

## 2020-10-12 MED ORDER — ROCURONIUM BROMIDE 10 MG/ML (PF) SYRINGE
PREFILLED_SYRINGE | INTRAVENOUS | Status: DC | PRN
  Administered 2020-10-12: 90 mg via INTRAVENOUS

## 2020-10-12 MED ORDER — FENTANYL CITRATE (PF) 100 MCG/2ML IJ SOLN
INTRAMUSCULAR | Status: AC
  Filled 2020-10-12: qty 2

## 2020-10-12 MED ORDER — SUGAMMADEX SODIUM 500 MG/5ML IV SOLN
INTRAVENOUS | Status: AC
  Filled 2020-10-12: qty 5

## 2020-10-12 MED ORDER — PROPOFOL 10 MG/ML IV BOLUS
INTRAVENOUS | Status: AC
  Filled 2020-10-12: qty 20

## 2020-10-12 MED ORDER — BUPIVACAINE LIPOSOME 1.3 % IJ SUSP
INTRAMUSCULAR | Status: DC | PRN
  Administered 2020-10-12: 10 mL via PERINEURAL

## 2020-10-12 MED ORDER — EPINEPHRINE PF 1 MG/ML IJ SOLN
INTRAMUSCULAR | Status: AC
  Filled 2020-10-12: qty 4

## 2020-10-12 MED ORDER — ONDANSETRON HCL 4 MG PO TABS: 4.0000 mg | ORAL_TABLET | Freq: Three times a day (TID) | ORAL | 1 refills | Status: AC | PRN

## 2020-10-12 MED ORDER — PROPOFOL 10 MG/ML IV BOLUS
INTRAVENOUS | Status: DC | PRN
  Administered 2020-10-12: 170 mg via INTRAVENOUS

## 2020-10-12 MED ORDER — DICLOFENAC SODIUM 75 MG PO TBEC: 75.0000 mg | DELAYED_RELEASE_TABLET | Freq: Two times a day (BID) | ORAL | 0 refills | Status: DC

## 2020-10-12 MED ORDER — SODIUM CHLORIDE 0.9 % IR SOLN
Status: DC | PRN
  Administered 2020-10-12 (×2): 6000 mL

## 2020-10-12 MED ORDER — ESMOLOL HCL 100 MG/10ML IV SOLN
INTRAVENOUS | Status: AC
  Filled 2020-10-12: qty 10

## 2020-10-12 MED ORDER — FENTANYL CITRATE (PF) 100 MCG/2ML IJ SOLN: 25.0000 ug | INTRAMUSCULAR | Status: DC | PRN

## 2020-10-12 MED ORDER — OXYCODONE HCL 5 MG PO TABS
ORAL_TABLET | ORAL | 0 refills | Status: DC
Start: 2020-10-12 — End: 2020-10-12

## 2020-10-12 MED ORDER — ACETAMINOPHEN 500 MG PO TABS: 1000.0000 mg | ORAL_TABLET | Freq: Three times a day (TID) | ORAL | 0 refills | Status: AC

## 2020-10-12 MED ORDER — ONDANSETRON HCL 4 MG/2ML IJ SOLN
INTRAMUSCULAR | Status: DC | PRN
  Administered 2020-10-12: 4 mg via INTRAVENOUS

## 2020-10-12 MED ORDER — CHLORHEXIDINE GLUCONATE 0.12 % MT SOLN
15.0000 mL | Freq: Once | OROMUCOSAL | Status: AC
  Administered 2020-10-12: 15 mL via OROMUCOSAL

## 2020-10-12 MED ORDER — LIDOCAINE HCL (PF) 2 % IJ SOLN
INTRAMUSCULAR | Status: AC
  Filled 2020-10-12: qty 5

## 2020-10-12 MED ORDER — MIDAZOLAM HCL 5 MG/5ML IJ SOLN
INTRAMUSCULAR | Status: DC | PRN
  Administered 2020-10-12: 2 mg via INTRAVENOUS

## 2020-10-12 MED ORDER — ONDANSETRON HCL 4 MG/2ML IJ SOLN
INTRAMUSCULAR | Status: AC
  Filled 2020-10-12: qty 2

## 2020-10-12 MED ORDER — OXYCODONE HCL 5 MG PO TABS
ORAL_TABLET | ORAL | 0 refills | Status: AC
Start: 2020-10-12 — End: 2020-10-17

## 2020-10-12 MED ORDER — 0.9 % SODIUM CHLORIDE (POUR BTL) OPTIME
TOPICAL | Status: DC | PRN
  Administered 2020-10-12: 1000 mL

## 2020-10-12 MED ORDER — FENTANYL CITRATE (PF) 100 MCG/2ML IJ SOLN
INTRAMUSCULAR | Status: DC | PRN
  Administered 2020-10-12 (×3): 50 ug via INTRAVENOUS

## 2020-10-12 MED ORDER — ACETAMINOPHEN 500 MG PO TABS
1000.0000 mg | ORAL_TABLET | Freq: Once | ORAL | Status: AC
  Administered 2020-10-12: 1000 mg via ORAL
  Filled 2020-10-12: qty 2

## 2020-10-12 MED ORDER — EPINEPHRINE PF 1 MG/ML IJ SOLN
INTRAMUSCULAR | Status: DC | PRN
  Administered 2020-10-12 (×2): 2 mg

## 2020-10-12 MED ORDER — LIDOCAINE 2% (20 MG/ML) 5 ML SYRINGE
INTRAMUSCULAR | Status: DC | PRN
  Administered 2020-10-12: 40 mg via INTRAVENOUS

## 2020-10-12 MED ORDER — DEXAMETHASONE SODIUM PHOSPHATE 10 MG/ML IJ SOLN
INTRAMUSCULAR | Status: DC | PRN
  Administered 2020-10-12: 10 mg via INTRAVENOUS

## 2020-10-12 MED ORDER — SUGAMMADEX SODIUM 500 MG/5ML IV SOLN
INTRAVENOUS | Status: DC | PRN
  Administered 2020-10-12: 400 mg via INTRAVENOUS

## 2020-10-12 MED ORDER — MIDAZOLAM HCL 2 MG/2ML IJ SOLN
INTRAMUSCULAR | Status: AC
  Filled 2020-10-12: qty 2

## 2020-10-12 MED ORDER — DEXAMETHASONE SODIUM PHOSPHATE 10 MG/ML IJ SOLN
INTRAMUSCULAR | Status: AC
  Filled 2020-10-12: qty 1

## 2020-10-12 MED ORDER — ESMOLOL HCL 100 MG/10ML IV SOLN
INTRAVENOUS | Status: DC | PRN
  Administered 2020-10-12: 30 mg via INTRAVENOUS

## 2020-10-12 MED ORDER — BUPIVACAINE HCL (PF) 0.5 % IJ SOLN
INTRAMUSCULAR | Status: DC | PRN
  Administered 2020-10-12: 15 mL via PERINEURAL

## 2020-10-12 MED ORDER — ORAL CARE MOUTH RINSE: 15.0000 mL | Freq: Once | OROMUCOSAL | Status: AC

## 2020-10-12 MED ORDER — LACTATED RINGERS IV SOLN: INTRAVENOUS | Status: DC

## 2020-10-12 SURGICAL SUPPLY — 62 items
AID PSTN UNV HD RSTRNT DISP (MISCELLANEOUS) ×2
APL PRP STRL LF DISP 70% ISPRP (MISCELLANEOUS) ×2
BLADE EXCALIBUR 4.0MM X 13CM (MISCELLANEOUS) ×1
BLADE EXCALIBUR 4.0X13 (MISCELLANEOUS) ×3 IMPLANT
BLADE SURG 15 STRL LF DISP TIS (BLADE) IMPLANT
BLADE SURG 15 STRL SS (BLADE)
BURR OVAL 8 FLU 4.0MM X 13CM (MISCELLANEOUS) ×1
BURR OVAL 8 FLU 4.0X13 (MISCELLANEOUS) ×1 IMPLANT
BURR OVAL 8 FLU 5.0MM X 13CM (MISCELLANEOUS)
BURR OVAL 8 FLU 5.0X13 (MISCELLANEOUS) IMPLANT
CANNULA TWIST IN 8.25X7CM (CANNULA) ×2 IMPLANT
CHLORAPREP W/TINT 26 (MISCELLANEOUS) ×4 IMPLANT
CLOSURE STERI-STRIP 1/2X4 (GAUZE/BANDAGES/DRESSINGS) ×1
CLSR STERI-STRIP ANTIMIC 1/2X4 (GAUZE/BANDAGES/DRESSINGS) ×3 IMPLANT
COVER WAND RF STERILE (DRAPES) IMPLANT
DECANTER SPIKE VIAL GLASS SM (MISCELLANEOUS) IMPLANT
DISSECTOR  3.8MM X 13CM (MISCELLANEOUS) ×4
DISSECTOR 3.8MM X 13CM (MISCELLANEOUS) ×2 IMPLANT
DRAPE IMP U-DRAPE 54X76 (DRAPES) ×4 IMPLANT
DRAPE ORTHO SPLIT 77X108 STRL (DRAPES) ×8
DRAPE STERI 35X30 U-POUCH (DRAPES) ×4 IMPLANT
DRAPE SURG ORHT 6 SPLT 77X108 (DRAPES) ×4 IMPLANT
DRAPE U-SHAPE 47X51 STRL (DRAPES) ×4 IMPLANT
DRSG PAD ABDOMINAL 8X10 ST (GAUZE/BANDAGES/DRESSINGS) ×6 IMPLANT
DW OUTFLOW CASSETTE/TUBE SET (MISCELLANEOUS) ×4 IMPLANT
ELECT MENISCUS 165MM 90D (ELECTRODE) ×4 IMPLANT
ELECT REM PT RETURN 15FT ADLT (MISCELLANEOUS) ×4 IMPLANT
GAUZE SPONGE 4X4 12PLY STRL (GAUZE/BANDAGES/DRESSINGS) ×4 IMPLANT
GLOVE BIO SURGEON STRL SZ 6.5 (GLOVE) ×6 IMPLANT
GLOVE BIO SURGEONS STRL SZ 6.5 (GLOVE) ×2
GLOVE BIOGEL PI IND STRL 6.5 (GLOVE) ×2 IMPLANT
GLOVE BIOGEL PI IND STRL 8 (GLOVE) ×2 IMPLANT
GLOVE BIOGEL PI INDICATOR 6.5 (GLOVE) ×2
GLOVE BIOGEL PI INDICATOR 8 (GLOVE) ×2
GLOVE ECLIPSE 8.0 STRL XLNG CF (GLOVE) ×4 IMPLANT
GOWN STRL REUS W/ TWL LRG LVL3 (GOWN DISPOSABLE) ×4 IMPLANT
GOWN STRL REUS W/TWL LRG LVL3 (GOWN DISPOSABLE) ×8
IV NS IRRIG 3000ML ARTHROMATIC (IV SOLUTION) ×16 IMPLANT
KIT BASIN OR (CUSTOM PROCEDURE TRAY) ×4 IMPLANT
KIT STABILIZATION SHOULDER (MISCELLANEOUS) ×4 IMPLANT
MANIFOLD NEPTUNE II (INSTRUMENTS) ×4 IMPLANT
NDL SCORPION MULTI FIRE (NEEDLE) IMPLANT
NEEDLE SCORPION MULTI FIRE (NEEDLE) IMPLANT
PACK ARTHROSCOPY WL (CUSTOM PROCEDURE TRAY) ×4 IMPLANT
PORT APPOLLO RF 90DEGREE MULTI (SURGICAL WAND) ×4 IMPLANT
RESTRAINT HEAD UNIVERSAL NS (MISCELLANEOUS) ×4 IMPLANT
SLING ARM FOAM STRAP LRG (SOFTGOODS) IMPLANT
SLING ARM FOAM STRAP MED (SOFTGOODS) IMPLANT
SLING ARM FOAM STRAP SML (SOFTGOODS) IMPLANT
SLING ARM FOAM STRAP XLG (SOFTGOODS) ×2 IMPLANT
SLING ARM IMMOBILIZER MED (SOFTGOODS) IMPLANT
SPONGE LAP 4X18 RFD (DISPOSABLE) ×4 IMPLANT
SUT ETHILON 3 0 PS 1 (SUTURE) IMPLANT
SUT FIBERWIRE #2 38 T-5 BLUE (SUTURE)
SUT MNCRL AB 4-0 PS2 18 (SUTURE) ×4 IMPLANT
SUT TIGER TAPE 7 IN WHITE (SUTURE) IMPLANT
SUTURE FIBERWR #2 38 T-5 BLUE (SUTURE) IMPLANT
TAPE FIBER 2MM 7IN #2 BLUE (SUTURE) IMPLANT
TOWEL OR 17X26 10 PK STRL BLUE (TOWEL DISPOSABLE) ×4 IMPLANT
TUBE SUCTION HIGH CAP CLEAR NV (SUCTIONS) ×4 IMPLANT
TUBING ARTHROSCOPY IRRIG 16FT (MISCELLANEOUS) ×4 IMPLANT
WATER STERILE IRR 1000ML POUR (IV SOLUTION) ×4 IMPLANT

## 2020-10-12 NOTE — Interval H&P Note (Signed)
History and Physical Interval Note:  10/12/2020 8:06 AM  Erin Good  has presented today for surgery, with the diagnosis of LEFT SHOULDER CARTILADGE DISORDER,ADHESIVE CAPSULITIS,ROTATOR CUFF TEAR,BICEP TENDONITIS,OSSTEOARTHRITIS.  The various methods of treatment have been discussed with the patient and family. After consideration of risks, benefits and other options for treatment, the patient has consented to  Procedure(s) with comments: IRRIGATION AND DEBRIDEMENT SHOULDER (Left) SHOULDER ARTHROSCOPY WITH ROTATOR CUFF REPAIR (Left) - POSSIBLE BICEPS TENODESIS (Left) - POSSIBLE OPEN SHOULDER ARTHROSCOPY WITH SUBACROMIAL DECOMPRESSION AND DISTAL CLAVICLE EXCISION (Left) as a surgical intervention.  The patient's history has been reviewed, patient examined, no change in status, stable for surgery.  I have reviewed the patient's chart and labs.  Questions were answered to the patient's satisfaction.     Hiram Gash

## 2020-10-12 NOTE — Discharge Instructions (Signed)
Ophelia Charter MD, MPH  [name], PA-C  [facility] [address]  [phone] (tel)   [phone] (fax)    POST-OPERATIVE INSTRUCTIONS - SHOULDER ARTHROSCOPY   WOUND CARE  - You may remove the Operative Dressing on Post-Op Day #3 (72hrs after surgery).   - Alternatively if you would like you can leave dressing on until follow-up if within 7-8 days but keep it dry.  - Leave steri-strips in place until they fall off on their own, usually 2 weeks postop.  - There may be a small amount of fluid/bleeding leaking at the surgical site.  - This is normal; the shoulder is filled with fluid during the procedure and can leak for 24-48hrs after surgery.  - You may change/reinforce the bandage as needed.  - Use the Cryocuff or Ice as often as possible for the first 7 days, then as needed for pain relief. Always keep a towel, ACE wrap or other barrier between the cooling unit and your skin.  - You may shower on Post-Op Day #3. Gently pat the area dry. Do not soak the shoulder in water or submerge it. Keep dry incisions as dry as possible.  - Do not go swimming in the pool or ocean until 4 weeks after surgery or when otherwise instructed.    EXERCISES/BRACING  - You may use a sling for comfort for one week - You can take your sling off for activities as you feel comfortable - Please schedule a physical therapy appointment ASAP if you have not already done so - This will be extremely important for your recovery! - Please continue to ambulate and do not stay sitting or lying for too long. Perform foot and wrist pumps to assist in circulation.   POST-OP MEDICATIONS- Multimodal approach to pain control  In general your pain will be controlled with a combination of substances. Prescriptions unless otherwise discussed are electronically sent to your pharmacy. This is a carefully made plan we use to minimize narcotic use.    Diclofenac - Anti-inflammatory  medication taken on a scheduled basis  Acetaminophen - Non-narcotic pain medicine taken on a scheduled basis  Oxycodone - This is a strong narcotic, to be used only on an "as needed" basis for pain.  Zofran - take as needed for nausea   Do NOT take Diclofenac with other anti-inflammatories such as Meloxicam, Celebrex, Motrin, Ibuprofen, Aleve, etc.   FOLLOW-UP  If you develop a Fever (=101.5), Redness or Drainage from the surgical incision site, please call our office to arrange for an evaluation.  Please call the office to schedule a follow-up appointment for your suture removal, 10-14 days post-operatively.    ---    HELPFUL INFORMATION   If you had a block, it will wear off between 8-24 hrs postop typically. This is period when your pain may go from nearly zero to the pain you would have had postop without the block. This is an abrupt transition but nothing dangerous is happening. You may take an extra dose of narcotic when this happens.   You may be more comfortable sleeping in a semi-seated position the first few nights following surgery. Keep a pillow propped under the elbow and forearm for comfort. If you have a recliner type of chair it might be beneficial. If not that is fine too, but it would be helpful to sleep propped up with pillows behind your operated shoulder as well under your elbow and forearm. This will reduce  pulling on the suture lines.   When dressing, put your operative arm in the sleeve first. When getting undressed, take your operative arm out last. Loose fitting, button-down shirts are recommended. Often in the first days after surgery you may be more comfortable keeping your operative arm under your shirt and not through the sleeve.   You may return to work/school in the next couple of days when you feel up to it. Desk work and typing in the sling is fine.   We suggest you use the pain medication the first night prior to going to bed, in order to ease  any pain when the anesthesia wears off. You should avoid taking pain medications on an empty stomach as it will make you nauseous.   You should wean off your narcotic medicines as soon as you are able. Most patients will be off or using minimal narcotics before their first postop appointment.   Do not drink alcoholic beverages or take illicit drugs when taking pain medications.   It is against the law to drive while taking narcotics. In some states it is against the law to drive while your arm is in a sling.   Pain medication may make you constipated. Below are a few solutions to try in this order:  Decrease the amount of pain medication if you aren't having pain.  Drink lots of decaffeinated fluids.  Drink prune juice and/or eat dried prunes   If the first 3 don't work start with additional solutions  Take Colace - an over-the-counter stool softener  Take Senokot - an over-the-counter laxative  Take Miralax - a stronger over-the-counter laxative

## 2020-10-12 NOTE — Transfer of Care (Signed)
Immediate Anesthesia Transfer of Care Note  Patient: Erin Good  Procedure(s) Performed: Procedure(s): IRRIGATION AND DEBRIDEMENT SHOULDER (Left) SHOULDER ARTHROSCOPY WITH SUBACROMIAL DECOMPRESSION, LYSIS OF ADHESIONS (Left)  Patient Location: PACU  Anesthesia Type:General  Level of Consciousness: Alert, Awake, Oriented  Airway & Oxygen Therapy: Patient Spontanous Breathing  Post-op Assessment: Report given to RN  Post vital signs: Reviewed and stable  Last Vitals:  Vitals:   10/12/20 0703  BP: (!) 153/92  Pulse: (!) 108  Resp: (!) 21  Temp: 36.8 C  SpO2: 03%    Complications: No apparent anesthesia complications

## 2020-10-12 NOTE — Anesthesia Postprocedure Evaluation (Signed)
Anesthesia Post Note  Patient: Alveria N Gant-Pegues  Procedure(s) Performed: IRRIGATION AND DEBRIDEMENT SHOULDER (Left Shoulder) SHOULDER ARTHROSCOPY WITH SUBACROMIAL DECOMPRESSION, LYSIS OF ADHESIONS (Left )     Patient location during evaluation: PACU Anesthesia Type: Regional and General Level of consciousness: awake and alert Pain management: pain level controlled Vital Signs Assessment: post-procedure vital signs reviewed and stable Respiratory status: spontaneous breathing, nonlabored ventilation, respiratory function stable and patient connected to nasal cannula oxygen Cardiovascular status: blood pressure returned to baseline and stable Postop Assessment: no apparent nausea or vomiting Anesthetic complications: no   No complications documented.  Last Vitals:  Vitals:   10/12/20 1030 10/12/20 1039  BP: 135/78 (!) 150/87  Pulse: (!) 102   Resp: 18 16  Temp: 37 C 36.8 C  SpO2: 92% 92%    Last Pain:  Vitals:   10/12/20 1039  TempSrc: Oral  PainSc: 0-No pain                 Kaoru Benda L Sarayu Prevost

## 2020-10-12 NOTE — Anesthesia Procedure Notes (Signed)
Anesthesia Regional Block: Interscalene brachial plexus block   Pre-Anesthetic Checklist: ,, timeout performed, Correct Patient, Correct Site, Correct Laterality, Correct Procedure, Correct Position, site marked, Risks and benefits discussed,  Surgical consent,  Pre-op evaluation,  At surgeon's request and post-op pain management  Laterality: Left  Prep: Maximum Sterile Barrier Precautions used, chloraprep       Needles:  Injection technique: Single-shot  Needle Type: Echogenic Stimulator Needle     Needle Length: 5cm  Needle Gauge: 22     Additional Needles:   Procedures:,,,, ultrasound used (permanent image in chart),,,,  Narrative:  Start time: 10/12/2020 8:03 AM End time: 10/12/2020 8:13 AM Injection made incrementally with aspirations every 5 mL.  Performed by: Personally  Anesthesiologist: Freddrick March, MD  Additional Notes: Monitors applied. No increased pain on injection. No increased resistance to injection. Injection made in 5cc increments. Good needle visualization. Patient tolerated procedure well.

## 2020-10-12 NOTE — Op Note (Signed)
Orthopaedic Surgery Operative Note (CSN: [id number])  Erin Good  03-23-1976 Date of Surgery: 10/12/2020   Diagnoses:  Left shoulder adhesive capsulitis, AC arthrosis and impingement with bursitis  Procedure: Arthroscopic extensive debridement Arthroscopic subacromial decompression Arthroscopic distal clavicle excision Lysis of adhesions with manipulation under anesthesia.   Operative Finding Exam under anesthesia: Preoperatively the patient had about 60 degrees of forward elevation and external rotation to 0, postoperatively were able to get 107 degrees of forward elevation external rotation 90 Articular space: Extensive capsulitis with contracted capsule and thickening in the anterior interval, joint was intact. Chondral surfaces:Intact, no sign of chondral degeneration on the glenoid or humeral head Biceps: Redness but no tearing Subscapularis: Intact Superior Cuff: Mild articular sided fraying about 1 to 2%.  Debrided back to a stable base. Bursal side: Extensive bursitis but otherwise the cuff was normal.  Successful completion of the planned procedure.  MRI suggested that there may have been a partial-thickness rotator cuff tear however that was not the case.  This was an advanced case of adhesive capsulitis.  We had a good release and the cuff was essentially intact.   Post-operative plan: The patient will be non-weightbearing in a sling for a week with aggressive therapy to start soon as possible.  The patient will be discharged home.  DVT prophylaxis not indicated in ambulatory upper extremity patient without known risk factors.   Pain control with PRN pain medication preferring oral medicines.  Follow up plan will be scheduled in approximately 7 days for incision check and XR.  Post-Op Diagnosis: Same Surgeons:Primary: Hiram Gash, MD Assistants:Caroline McBane PA-C Location: [facility] ROOM 06 Anesthesia: General with Exparel interscalene block Antibiotics: Ancef 3  g Tourniquet time: None Estimated Blood Loss: Minimal Complications: None Specimens: None Implants: * No implants in log *  Indications for Surgery:   Erin Good is a 44 y.o. female with continued shoulder pain refractory to nonoperative measures for extended period of time.  She had uncontrolled diabetes and was unable to have a scope for many months secondary to this.  She had failed therapy.  MRI was suboptimal with motion artifact on multiple runs.  The risks and benefits were explained at length including but not limited to continued pain, cuff failure, biceps tenodesis failure, stiffness, need for further surgery and infection.   Procedure:   Patient was correctly identified in the preoperative holding area and operative site marked.  Patient brought to OR and positioned beachchair on an [place] table ensuring that all bony prominences were padded and the head was in an appropriate location.  Anesthesia was induced and the operative shoulder was prepped and draped in the usual sterile fashion.  Timeout was called preincision.  It was was in fact quite difficult to enter the joint secondary to the patient's contracted capsule.  We were able to clear the subacromial space and then used a switching stick to gain entry into the joint itself.  We then made our anterior portal.  Extensive debridement was performed of the anterior interval tissue, labral fraying and the bursa.  Subacromial decompression: We made a lateral portal with spinal needle guidance. We then proceeded to debride bursal tissue extensively with a shaver and arthrocare device. At that point we continued to identify the borders of the acromion and identify the spur. We then carefully preserved the deltoid fascia and used a burr to convert the acromion to a Type 1 flat acromion without issue.  Distal Clavicle resection:  The scope was  placed in the subacromial space from the posterior portal.  A hemostat was placed  through the anterior portal and we spread at the [facility] joint.  A burr was then inserted and 10 mm of distal clavicle was resected taking care to avoid damage to the capsule around the joint and avoiding overhanging bone posteriorly.     We performed a extensive capsular release and lysis of adhesion for skeletonizing the rotator interval clearing to the base of the coracoid.  Once this was complete we identified the capsular layer just posterior to the muscle of the subscapularis.  We released this with combination of an RF ablator as well as a basket.  [place] was used to clear soft tissue.  We did the same procedure posteriorly.  At that point we performed a careful manipulation and placed instruments back into the shoulder to verify that the cuff was intact.  The incisions were closed with absorbable monocryl and steri strips.  A sterile dressing was placed along with a sling. The patient was awoken from general anesthesia and taken to the PACU in stable condition without complication.   [name], PA-C, present and scrubbed throughout the case, critical for completion in a timely fashion, and for retraction, instrumentation, closure.

## 2020-10-12 NOTE — Anesthesia Procedure Notes (Signed)
Procedure Name: Intubation Date/Time: 10/12/2020 8:33 AM Performed by: Gerald Leitz, CRNA Pre-anesthesia Checklist: Patient identified, Patient being monitored, Timeout performed, Emergency Drugs available and Suction available Patient Re-evaluated:Patient Re-evaluated prior to induction Oxygen Delivery Method: Circle system utilized Preoxygenation: Pre-oxygenation with 100% oxygen Induction Type: IV induction Ventilation: Mask ventilation without difficulty Laryngoscope Size: Mac and 3 Grade View: Grade I Tube type: Oral Tube size: 7.0 mm Number of attempts: 1 Placement Confirmation: ETT inserted through vocal cords under direct vision,  positive ETCO2 and breath sounds checked- equal and bilateral Secured at: 23 cm Tube secured with: Tape Dental Injury: Teeth and Oropharynx as per pre-operative assessment

## 2020-10-13 ENCOUNTER — Encounter (HOSPITAL_COMMUNITY): Payer: Self-pay | Admitting: Orthopaedic Surgery

## 2020-10-27 ENCOUNTER — Ambulatory Visit (INDEPENDENT_AMBULATORY_CARE_PROVIDER_SITE_OTHER): Payer: 59 | Admitting: Internal Medicine

## 2020-10-27 ENCOUNTER — Other Ambulatory Visit: Payer: Self-pay

## 2020-10-27 ENCOUNTER — Encounter: Payer: Self-pay | Admitting: Internal Medicine

## 2020-10-27 VITALS — BP 130/82 | HR 109 | Ht 67.0 in | Wt 316.8 lb

## 2020-10-27 DIAGNOSIS — E1169 Type 2 diabetes mellitus with other specified complication: Secondary | ICD-10-CM | POA: Diagnosis not present

## 2020-10-27 DIAGNOSIS — E042 Nontoxic multinodular goiter: Secondary | ICD-10-CM | POA: Diagnosis not present

## 2020-10-27 DIAGNOSIS — R809 Proteinuria, unspecified: Secondary | ICD-10-CM | POA: Diagnosis not present

## 2020-10-27 DIAGNOSIS — E1129 Type 2 diabetes mellitus with other diabetic kidney complication: Secondary | ICD-10-CM

## 2020-10-27 DIAGNOSIS — Z794 Long term (current) use of insulin: Secondary | ICD-10-CM

## 2020-10-27 DIAGNOSIS — E785 Hyperlipidemia, unspecified: Secondary | ICD-10-CM | POA: Diagnosis not present

## 2020-10-27 LAB — MICROALBUMIN / CREATININE URINE RATIO
Creatinine,U: 193.4 mg/dL
Microalb Creat Ratio: 3.9 mg/g (ref 0.0–30.0)
Microalb, Ur: 7.6 mg/dL — ABNORMAL HIGH (ref 0.0–1.9)

## 2020-10-27 LAB — LIPID PANEL
Cholesterol: 217 mg/dL — ABNORMAL HIGH (ref 0–200)
HDL: 40.5 mg/dL (ref >39.00)
LDL Cholesterol: 149 mg/dL — ABNORMAL HIGH (ref 0–99)
NonHDL: 176.3
Total CHOL/HDL Ratio: 5
Triglycerides: 139 mg/dL (ref 0.0–149.0)
VLDL: 27.8 mg/dL (ref 0.0–40.0)

## 2020-10-27 LAB — TSH: TSH: 1.74 u[IU]/mL (ref 0.35–4.50)

## 2020-10-27 MED ORDER — METFORMIN HCL ER 500 MG PO TB24: 2000.0000 mg | ORAL_TABLET | Freq: Every day | ORAL | 3 refills | Status: DC

## 2020-10-27 MED ORDER — LANTUS SOLOSTAR 100 UNIT/ML ~~LOC~~ SOPN
60.0000 [IU] | PEN_INJECTOR | Freq: Every day | SUBCUTANEOUS | 3 refills | Status: DC
Start: 2020-10-27 — End: 2021-02-20

## 2020-10-27 NOTE — Patient Instructions (Addendum)
Please increase: - Metformin ER to 2000 mg with dinner  Please continue: - Ozempic 1 mg weekly - Lantus 56 units at bedtime (may need to increase to 60 if sugars in am remain high after increasing Metformin)  Please stop at the lab.  We will need to schedule another thyroid ultrasound. I will order this today.  Please return in 3 months with your sugar log.

## 2020-10-27 NOTE — Progress Notes (Signed)
Patient ID: Erin Good, female   DOB: 1975-11-21, 44 y.o.   MRN: [id number]   This visit occurred during the SARS-CoV-2 public health emergency.  Safety protocols were in place, including screening questions prior to the visit, additional usage of staff PPE, and extensive cleaning of exam room while observing appropriate contact time as indicated for disinfecting solutions.   HPI: Erin Good is a 44 y.o.-year-old female, initially referred by her PCP, Dr. Ancil Boozer, presenting for follow-up for DM2, dx in ~2015, insulin-dependent since 2019, uncontrolled, with microalbuminuria and also for thyroid nodules.  Last visit 3 months ago.  She has a history of frozen L shoulder >> was on Prednisone x 1 mo summer 2021.  She had shoulder surgery 10/12/2020.  Reviewed HbA1c levels: Lab Results  Component Value Date   HGBA1C 7.6 (H) 10/03/2020   HGBA1C 7.9 (A) 08/15/2020   HGBA1C 9.0 (H) 06/30/2020   HGBA1C 8.3 (A) 05/30/2020   HGBA1C 8.0 (A) 11/12/2019   HGBA1C 10.9 (H) 05/25/2019   HGBA1C 10.0 (A) 01/29/2019   HGBA1C 10.4 (A) 04/01/2018   HGBA1C 9.8 12/18/2017   HGBA1C 9.4 09/17/2017   Pt was on a regimen of: - Metformin ER 750 mg 2x a day, with meals - Soliqua (Lantus + Lixisenatide) 50 units daily in am  Currently on: - Metformin ER 750 mg 2x a day with meals >> 1500 mg with dinner - Lantus 50 >> 56 units at bedtime - Ozempic 1 mg weekly Tried Farxiga 06/2020 >> not covered.  She checks her sugars 1-2 times a day-no log, no meter: - am: 220-230 >> 146-200, 280 >> 220-330, 393 >> 140-160, 170 - 2h after b'fast: n/c >> 85-130 - before lunch: n/c >> 92-180s >> n/c - 2h after lunch: 170-180 >> 98-118 >> n/c >> 140-180 >> n/c - before dinner: n/c >> 120-150 >> 150-201 >> 140s - 2h after dinner: n/c - bedtime: n/c >> 150s >> n/c - nighttime: n/c Lowest sugar was98 >> 92 >> 80; she has hypoglycemia awareness at 100. Highest sugar was 393 >> 170  Glucometer: [facility]  Pt's meals are: - Breakfast: 2 eggs + 3 pieces of bacon >> 2 eggs - snack: fruit - Lunch: salad - Dinner:meat + veggie + starch (rice + pasta) - Snacks: 1  Reducing sodas: Coke >> sparkling water At last visit, she was still drinking juice and I strongly advised her to stop.  -No CKD, last BUN/creatinine:  Lab Results  Component Value Date   BUN 10 10/03/2020   BUN 6 07/13/2020   CREATININE 0.61 10/03/2020   CREATININE 0.67 07/13/2020  Not on ACE inhibitor/ARB.  ACR levels are high: Lab Results  Component Value Date   MICRALBCREAT 33 (H) 11/12/2019   MICRALBCREAT 30 (H) 01/29/2019   -+ HL; last set of lipids: Lab Results  Component Value Date   CHOL 204 (H) 06/30/2020   HDL 46 (L) 06/30/2020   LDLCALC 135 (H) 06/30/2020   TRIG 118 06/30/2020   CHOLHDL 4.4 06/30/2020  She was started on Crestor 10 by PCP after the above results returned.  - last eye exam was in 2020: No DR reportedly  - no numbness and tingling in her feet.  Pt has FH of DM in mother, MGF.  Thyroid nodules:  Reviewed previous work-up: 06/01/2019: Thyroid ultrasound: Parenchymal Echotexture: Mildly heterogenous Isthmus: 0.4 cm thickness Right lobe: 5 x 2.4 x 2.8 cm Left lobe: 4.5 x 2 x 2.1 cm  Estimated total  number of nodules >/= 1 cm: 4  Nodule # 1: Location: Right; Mid Maximum size: 1.6 cm; Other 2 dimensions: 1.5 x 1.4 cm Composition: mixed cystic and solid (1) Echogenicity: hypoechoic (2) *Given size (>/= 1.5 - 2.4 cm) and appearance, a follow-up ultrasound in 1 year should be considered based on TI-RADS criteria. _________________________________________________________  Nodule # 2: Location: Right; Inferior Maximum size: 2.3 cm; Other 2 dimensions: 1.9 x 1.9 cm Composition: solid/almost completely solid (2) Echogenicity: hypoechoic (2) **Given size (>/= 1.5 cm) and appearance, fine needle aspiration of this moderately suspicious nodule should be considered based  on TI-RADS criteria. _________________________________________________  Nodule # 3: Location: Left; Inferior Maximum size: 1.4 cm; Other 2 dimensions: 1.2 x 1.2 cm Composition: solid/almost completely solid (2) Echogenicity: isoechoic (1) Given size (<1.4 cm) and appearance, this nodule does NOT meet TI-RADS criteria for biopsy or dedicated follow-up. _________________________________________________________  Nodule # 4: Location: Left; Mid Maximum size: 1.8 cm; Other 2 dimensions: 1.6 x 1.3 cm Composition: solid/almost completely solid (2) Echogenicity: hypoechoic (2) **Given size (>/= 1.5 cm) and appearance, fine needle aspiration of this moderately suspicious nodule should be considered based on TI-RADS criteria.  IMPRESSION: 1. Thyromegaly with bilateral nodules. 2. Recommend FNA biopsy of moderately suspicious 1.8 cm mid left AND 2.3 cm inferior right nodules. 3. Recommend annual/biennial ultrasound follow-up of additional nodules as above, until stability x5 years confirmed.  08/05/2019: FNA of the 2 nodules: Clinical History: Right inferior 2.3cm; Other 2 dimensions: 1.9 x 1.9cm,  Solid / almost completely solid, Hypoechoic, TI-RADS total points 4  Specimen Submitted: A. THYROID, RLP, FINE NEEDLE ASPIRATION:  DIAGNOSIS:  - Consistent with benign follicular nodule (Bethesda category II)  SPECIMEN ADEQUACY:  Satisfactory for evaluation   Clinical History: Left mid 1.8cm; Other 2 dimensions: 1.6 x 1.3cm, Solid  / almost completely solid, Hypoechoic, TI-RADS total points 4  Specimen Submitted: A. THYROID, LMP, FINE NEEDLE ASPIRATION:  DIAGNOSIS:  - Consistent with benign follicular nodule (Bethesda category II)  SPECIMEN ADEQUACY:  Satisfactory for evaluation  Reviewed her TFTs: Lab Results  Component Value Date   TSH 1.35 05/25/2019   TSH 1.070 07/28/2015   TSH 1.13 10/15/2014   Pt denies: - feeling nodules in neck - hoarseness - dysphagia -  choking - SOB with lying down  She also has a history of PCOS and distant history of epilepsy at 44 years old.  ROS: Constitutional: no weight gain/+ weight loss, no fatigue, no subjective hyperthermia, no subjective hypothermia Eyes: no blurry vision, no xerophthalmia ENT: no sore throat, + see HPI Cardiovascular: no CP/no SOB/no palpitations/no leg swelling Respiratory: no cough/no SOB/no wheezing Gastrointestinal: no N/no V/no D/no C/no acid reflux Musculoskeletal: no muscle aches/+ joint aches Skin: no rashes, no hair loss Neurological: no tremors/no numbness/no tingling/no dizziness  I reviewed pt's medications, allergies, PMH, social hx, family hx, and changes were documented in the history of present illness. Otherwise, unchanged from my initial visit note.  Past Medical History:  Diagnosis Date  . Allergic rhinitis   . Breast discharge 06/05/2017   2 weeks ago left  . Breast mass 12/06/2016   left  . Diabetes mellitus without complication ([place])   . Dyslipidemia   . Epilepsy ([place])   . Epilepsy ([place])   . Febrile seizures ([place])   . Galactorrhea   . Hx gestational diabetes   . Hypertension   . Kidney stones   . Obesity   . Seizures ([place])   . Sleep apnea   .  Syncope and collapse   . Tachycardia    Past Surgical History:  Procedure Laterality Date  . BREAST BIOPSY Left 2018   benign  . CESAREAN SECTION     X 2  . IRRIGATION AND DEBRIDEMENT SHOULDER Left 10/12/2020   Procedure: IRRIGATION AND DEBRIDEMENT SHOULDER;  Surgeon: Hiram Gash, MD;  Location: [facility];  Service: Orthopedics;  Laterality: Left;  . RIGHT OOPHORECTOMY Right 2001   benign tumor  . TUBAL LIGATION  2007   Social History   Socioeconomic History  . Marital status: Married    Spouse name: Roderic Palau  . Number of children: 2  . Years of education: College  . Highest education level: Not on file  Occupational History  . Occupation: Optometrist  Tobacco Use  . Smoking status: Never Smoker  .  Smokeless tobacco: Never Used  Vaping Use  . Vaping Use: Never used  Substance and Sexual Activity  . Alcohol use: Yes    Comment: occass  . Drug use: No  . Sexual activity: Yes    Partners: Male    Birth control/protection: Surgical    Comment: tubial lig  Other Topics Concern  . Not on file  Social History Narrative  . Not on file   Social Determinants of Health   Financial Resource Strain: Low Risk   . Difficulty of Paying Living Expenses: Not hard at all  Food Insecurity: No Food Insecurity  . Worried About Charity fundraiser in the Last Year: Never true  . Ran Out of Food in the Last Year: Never true  Transportation Needs: No Transportation Needs  . Lack of Transportation (Medical): No  . Lack of Transportation (Non-Medical): No  Physical Activity: Not on file  Stress: No Stress Concern Present  . Feeling of Stress : Not at all  Social Connections: Moderately Isolated  . Frequency of Communication with Friends and Family: More than three times a week  . Frequency of Social Gatherings with Friends and Family: More than three times a week  . Attends Religious Services: Never  . Active Member of Clubs or Organizations: No  . Attends Archivist Meetings: Never  . Marital Status: Married  Human resources officer Violence: Not At Risk  . Fear of Current or Ex-Partner: No  . Emotionally Abused: No  . Physically Abused: No  . Sexually Abused: No   Meds: - Metformin ER 750 mg 2x a day, with meals - Soliqua (Lantus + Lixisenatide) 50 units dialy in am + at Current Outpatient Medications on File Prior to Visit  Medication Sig Dispense Refill  . atenolol (TENORMIN) 25 MG tablet TAKE 1 TABLET BY MOUTH EVERY DAY (Patient not taking: Reported on 09/26/2020) 30 tablet 0  . clonazePAM (KLONOPIN) 0.5 MG tablet Take 0.5 mg by mouth daily as needed (Seizures).   3  . Continuous Blood Gluc Sensor (FREESTYLE LIBRE 2 SENSOR) MISC 1 each by Does not apply route every 14 (fourteen)  days. (Patient not taking: Reported on 08/15/2020) 2 each 5  . diclofenac (VOLTAREN) 75 MG EC tablet Take 1 tablet (75 mg total) by mouth 2 (two) times daily. 60 tablet 0  . EPINEPHrine (EPIPEN 2-PAK) 0.3 mg/0.3 mL IJ SOAJ injection Inject 0.3 mg into the muscle as needed for anaphylaxis (for anaphylaxis).     . fluticasone (FLONASE) 50 MCG/ACT nasal spray Place 2 sprays into both nostrils daily as needed for allergies ([place] allergies).   12  . Insulin Pen Needle 32G X 4 MM  MISC Use 1x a day 100 each 3  . LANTUS SOLOSTAR 100 UNIT/ML Solostar Pen INJECT 50 UNITS INTO THE SKIN DAILY. (Patient taking differently: Inject 56 Units into the skin at bedtime. ) 15 mL 2  . metFORMIN (GLUCOPHAGE-XR) 750 MG 24 hr tablet TAKE 2 TABLETS (1,500 MG TOTAL) BY MOUTH DAILY. 180 tablet 1  . montelukast (SINGULAIR) 10 MG tablet TAKE 1 TABLET BY MOUTH EVERYDAY AT BEDTIME (Patient taking differently: Take 10 mg by mouth at bedtime. ) 90 tablet 0  . OneTouch Delica Lancets 74Q MISC Use 2x a day with OneTouch Verio Flex 200 each 3  . ONETOUCH VERIO test strip USE 2X A DAY WITH ONETOUCH VERIO FLEX 50 strip 15  . OZEMPIC, 1 MG/DOSE, 2 MG/1.5ML SOPN INJECT 1 MG INTO THE SKIN ONCE A WEEK. (Patient taking differently: Inject 1 mg into the skin every Sunday. ) 3 pen 5  . OZEMPIC, 1 MG/DOSE, 4 MG/3ML SOPN Inject 1 mL into the skin once a week.    . rosuvastatin (CRESTOR) 10 MG tablet Take 1 tablet (10 mg total) by mouth daily. 90 tablet 1  . tiZANidine (ZANAFLEX) 2 MG tablet Take 2 mg by mouth at bedtime as needed for muscle spasms.     . Vitamin D, Ergocalciferol, (DRISDOL) 1.25 MG (50000 UNIT) CAPS capsule TAKE 1 CAPSULE (50,000 UNITS TOTAL) BY MOUTH EVERY 7 (SEVEN) DAYS. (Patient taking differently: Take 50,000 Units by mouth every Friday. ) 4 capsule 2  . zonisamide (ZONEGRAN) 100 MG capsule Take 400 mg by mouth at bedtime.      No current facility-administered medications on file prior to visit.   Allergies  Allergen  Reactions  . Peanuts [Peanut Oil] Anaphylaxis  . Aspirin Other (See Comments)    Does not take because of her epilepsy/seizure    Family History  Problem Relation Age of Onset  . Diabetes Mother   . Breast cancer Paternal Grandmother 21  . Cancer Paternal Grandmother   . Cancer Paternal Aunt   . Cancer Maternal Grandmother   . Heart disease Neg Hx    PE: BP 130/82   Pulse (!) 109   Ht 5' 7"  (1.702 m)   Wt (!) 316 lb 12.8 oz (143.7 kg)   SpO2 97%   BMI 49.62 kg/m  Wt Readings from Last 3 Encounters:  10/27/20 (!) 316 lb 12.8 oz (143.7 kg)  10/12/20 (!) 312 lb (141.5 kg)  10/03/20 (!) 312 lb (141.5 kg)   Constitutional: overweight, in NAD Eyes: PERRLA, EOMI, no exophthalmos ENT: moist mucous membranes, no thyromegaly, no cervical lymphadenopathy Cardiovascular: RRR, No MRG Respiratory: CTA B Gastrointestinal: abdomen soft, NT, ND, BS+ Musculoskeletal: no deformities, strength intact in all 4 Skin: moist, warm, no rashes Neurological: no tremor with outstretched hands, DTR normal in all 4  ASSESSMENT: 1. DM2, insulin-dependent, uncontrolled, with complications - MAU  2.  Thyroid nodules  3. HL  4. Obesity class 3  PLAN:  1. Patient with longstanding, uncontrolled, type 2 diabetes, on oral antidiabetic regimen with Metformin, also, weekly GLP-1 receptor agonist and daily long-acting insulin with worse sugars at last visit due to drinking sweet drinks and being on prednisone.  We removed her entire Metformin ER dose with dinner and we also increased her Lantus dose at that time.  We discussed that if the sugars did not improve, we may need to start rapid acting insulin.  At last visit, HbA1c was 9%, but it has improved afterwards, to 7.6% at the end  of last month. -Patient mentions that her sugars improved significantly after that the Lantus at night.  Also, she is now taking the Metformin with dinner and she tolerates it well.  Blood sugars in the morning are better, but  they are still above target so I advised her to increase the Metformin dose with dinner to 2000 mg daily.  If sugars do not improve after this change, I advised her to increase the dose of Lantus. - I suggested to:  Patient Instructions  Please increase: - Metformin ER to 2000 mg with dinner  Please continue: - Ozempic 1 mg weekly - Lantus 56 units at bedtime (may need to increase to 60 if sugars in am remain high after increasing Metformin)  Please stop at the lab.  We will need to schedule another thyroid ultrasound. I will order this today.  Please return in 3 months with your sugar log.   - advised to check sugars at different times of the day - 2x a day, rotating check times - advised for yearly eye exams >> she is not UTD - her urinary proteins were elevated in the past.  We will repeat it today.  If still elevated, will start an ACE inhibitor or ARB.  Her blood pressure is also slightly above target - return to clinic in 3 months  2.  Thyroid nodules -No neck compression symptoms -Latest TSH was reviewed and this was normal: Lab Results  Component Value Date   TSH 1.35 05/25/2019  -Reviewed the results of her most recent thyroid ultrasound from 05/2019 and the 2 thyroid nodule biopsies from 07/2019.  These were benign. -We will repeat another thyroid ultrasound now.  3. HL -Reviewed latest lipid panel from 06/2020: LDL high, HDL low, Triglycerides at goal: Lab Results  Component Value Date   CHOL 204 (H) 06/30/2020   HDL 46 (L) 06/30/2020   LDLCALC 135 (H) 06/30/2020   TRIG 118 06/30/2020   CHOLHDL 4.4 06/30/2020  -Continues Crestor 10 without side effects - started after the above results returned -We will repeat her lipid panel today.  4.  Obesity class III -Continue Ozempic which should also help with weight loss -She lost a net 6 pounds since last visit  Orders Placed This Encounter  Procedures  . US THYROID  . TSH  . Microalbumin / creatinine urine ratio   . Lipid panel   Component     Latest Ref Rng & Units 10/27/2020  Cholesterol     0 - 200 mg/dL 217 (H)  Triglycerides     0.0 - 149.0 mg/dL 139.0  HDL Cholesterol     >39.00 mg/dL 40.50  VLDL     0.0 - 40.0 mg/dL 27.8  LDL (calc)     0 - 99 mg/dL 149 (H)  Total CHOL/HDL Ratio      5  NonHDL      176.30  Microalb, Ur     0.0 - 1.9 mg/dL 7.6 (H)  Creatinine,U     mg/dL 193.4  MICROALB/CREAT RATIO     0.0 - 30.0 mg/g 3.9  TSH     0.35 - 4.50 uIU/mL 1.74  ACR improved.  TSH normal.  LDL is still elevated.  I would suggest to increase Crestor to 20 mg daily.  Philemon Kingdom, MD PhD [facility] Endocrinology

## 2020-10-31 MED ORDER — ROSUVASTATIN CALCIUM 20 MG PO TABS: 20.0000 mg | ORAL_TABLET | Freq: Every day | ORAL | 3 refills | Status: AC

## 2020-11-04 ENCOUNTER — Other Ambulatory Visit: Payer: Self-pay | Admitting: Family Medicine

## 2020-11-04 NOTE — Telephone Encounter (Signed)
Requested medications are due for refill today yes  Requested medications are on the active medication list yes  Last refill 11/25  Last visit Jan 2021  Future visit scheduled yes Jan 2022  Notes to clinic Not Delegated

## 2020-11-05 DIAGNOSIS — U071 COVID-19: Secondary | ICD-10-CM

## 2020-11-05 HISTORY — DX: COVID-19: U07.1

## 2020-11-07 ENCOUNTER — Other Ambulatory Visit: Payer: Self-pay

## 2020-11-15 ENCOUNTER — Ambulatory Visit: Payer: 59 | Admitting: Family Medicine

## 2020-11-15 NOTE — Progress Notes (Deleted)
Name: Erin Good   MRN: [id number]    DOB: Feb 06, 1976   Date:11/15/2020       Progress Note  Subjective  Chief Complaint  Follow Up  HPI  Left shoulder pain: she states symptoms started around April 2021 after COVID-19 vaccine, she states she does recall slipping in her shower before the vaccine. She went to [facility] but PT was not helping, she switched to [employer] and had MRI and was diagnosed with rotator cuff tear. She came in for pre-op clearance back in July but A1C was 8.3 %, pre-op clearance requested A1C below 7.5 % but she saw Ortho last Friday and he said he will proceed with surgery if below 8 %. Today A1C is 7.9%, may proceed to surgery based on DM  DMII: she is under the care of Dr. Renne Crigler again She states she has been compliant with her medications and denies side effects. No polyphagia, polydipsia or polyuria. Glucose fasting has been below 140 fasting, and highest of 184  She is taking Metformin 1500 mg daily , Lantus 53 units, Ozempic 1 mg . She has changed her diet , eating cucumbers throughout the day , chicken for protein   Leucocytosis: we will recheck level, she has seen Dr. Janese Banks back in 2018 and was given reassurance we will recheck level prior to surgery for baseline   Tachycardia: chronic seen by cardiologist, had a holter monitor but results not back, I will send Dr. Garen Lah a note to see if she is cleared in the cardiac view point for surgery   Patient Active Problem List   Diagnosis Date Noted  . Impingement syndrome of left shoulder region 04/20/2020  . Vitamin D deficiency 01/31/2017  . Right shoulder tendinitis 01/31/2017  . Tendinosis 01/31/2017  . Bell's palsy 12/19/2016  . Tachycardia 09/30/2016  . Depression with anxiety 05/06/2015  . Allergic rhinitis 04/20/2015  . Anxiety and depression 04/20/2015  . 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 Gastro-esophageal reflux disease without esophagitis 04/20/2015  .  Dysmetabolic syndrome 83/66/2947  . Extreme obesity 04/20/2015  . NASH (nonalcoholic steatohepatitis) 04/20/2015  . Allergy to nuts 04/20/2015  . Calculus of kidney 04/20/2015  . 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 Central sleep apnea 11/26/2008  . Dyslipidemia 07/02/2008  . Leukocytosis 07/29/2007    Past Surgical History:  Procedure Laterality Date  . BREAST BIOPSY Left 2018   benign  . CESAREAN SECTION     X 2  . IRRIGATION AND DEBRIDEMENT SHOULDER Left 10/12/2020   Procedure: IRRIGATION AND DEBRIDEMENT SHOULDER;  Surgeon: Hiram Gash, MD;  Location: [facility];  Service: Orthopedics;  Laterality: Left;  . RIGHT OOPHORECTOMY Right 2001   benign tumor  . TUBAL LIGATION  2007    Family History  Problem Relation Age of Onset  . Diabetes Mother   . Breast cancer Paternal Grandmother 62  . Cancer Paternal Grandmother   . Cancer Paternal Aunt   . Cancer Maternal Grandmother   . Heart disease Neg Hx     Social History   Tobacco Use  . Smoking status: Never Smoker  . Smokeless tobacco: Never Used  Substance Use Topics  . Alcohol use: Yes    Comment: occass     Current Outpatient Medications:  .  atenolol (TENORMIN) 25 MG tablet, TAKE 1 TABLET BY MOUTH EVERY DAY (Patient not taking: Reported on 09/26/2020), Disp: 30 tablet, Rfl: 0 .  clonazePAM (KLONOPIN) 0.5 MG  tablet, Take 0.5 mg by mouth daily as needed (Seizures). , Disp: , Rfl: 3 .  Continuous Blood Gluc Sensor (FREESTYLE LIBRE 2 SENSOR) MISC, 1 each by Does not apply route every 14 (fourteen) days. (Patient not taking: Reported on 08/15/2020), Disp: 2 each, Rfl: 5 .  diclofenac (VOLTAREN) 75 MG EC tablet, Take 1 tablet (75 mg total) by mouth 2 (two) times daily., Disp: 60 tablet, Rfl: 0 .  EPINEPHrine (EPIPEN 2-PAK) 0.3 mg/0.3 mL IJ SOAJ injection, Inject 0.3 mg into the muscle as needed for anaphylaxis (for anaphylaxis). , Disp: , Rfl:  .  fluticasone (FLONASE) 50 MCG/ACT nasal  spray, Place 2 sprays into both nostrils daily as needed for allergies (Spring allergies). , Disp: , Rfl: 12 .  insulin glargine (LANTUS SOLOSTAR) 100 UNIT/ML Solostar Pen, Inject 60 Units into the skin at bedtime., Disp: 30 mL, Rfl: 3 .  Insulin Pen Needle 32G X 4 MM MISC, Use 1x a day, Disp: 100 each, Rfl: 3 .  metFORMIN (GLUCOPHAGE-XR) 500 MG 24 hr tablet, Take 4 tablets (2,000 mg total) by mouth daily with supper., Disp: 360 tablet, Rfl: 3 .  montelukast (SINGULAIR) 10 MG tablet, TAKE 1 TABLET BY MOUTH EVERYDAY AT BEDTIME (Patient taking differently: Take 10 mg by mouth at bedtime. ), Disp: 90 tablet, Rfl: 0 .  OneTouch Delica Lancets 88Q MISC, Use 2x a day with OneTouch Verio Flex, Disp: 200 each, Rfl: 3 .  ONETOUCH VERIO test strip, USE 2X A DAY WITH ONETOUCH VERIO FLEX, Disp: 50 strip, Rfl: 15 .  OZEMPIC, 1 MG/DOSE, 4 MG/3ML SOPN, Inject 1 mL into the skin once a week., Disp: , Rfl:  .  rosuvastatin (CRESTOR) 20 MG tablet, Take 1 tablet (20 mg total) by mouth daily., Disp: 90 tablet, Rfl: 3 .  tiZANidine (ZANAFLEX) 2 MG tablet, Take 2 mg by mouth at bedtime as needed for muscle spasms. , Disp: , Rfl:  .  Vitamin D, Ergocalciferol, (DRISDOL) 1.25 MG (50000 UNIT) CAPS capsule, TAKE 1 CAPSULE (50,000 UNITS TOTAL) BY MOUTH EVERY 7 (SEVEN) DAYS. (Patient taking differently: Take 50,000 Units by mouth every Friday. ), Disp: 4 capsule, Rfl: 2 .  zonisamide (ZONEGRAN) 100 MG capsule, Take 400 mg by mouth at bedtime. , Disp: , Rfl:   Allergies  Allergen Reactions  . Peanuts [Peanut Oil] Anaphylaxis  . Aspirin Other (See Comments)    Does not take because of her epilepsy/seizure     I personally reviewed {Reviewed:14835} with the patient/caregiver today.   ROS  ***  Objective  There were no vitals filed for this visit.  There is no height or weight on file to calculate BMI.  Physical Exam ***  Recent Results (from the past 2160 hour(s))  Glucose, capillary     Status: Abnormal    Collection Time: 10/03/20  9:37 AM  Result Value Ref Range   Glucose-Capillary 196 (H) 70 - 99 mg/dL    Comment: Glucose reference range applies only to samples taken after fasting for at least 8 hours.  Basic metabolic panel per protocol     Status: Abnormal   Collection Time: 10/03/20 10:02 AM  Result Value Ref Range   Sodium 137 135 - 145 mmol/L   Potassium 4.1 3.5 - 5.1 mmol/L   Chloride 106 98 - 111 mmol/L   CO2 21 (L) 22 - 32 mmol/L   Glucose, Bld 199 (H) 70 - 99 mg/dL    Comment: Glucose reference range applies only to samples taken after  fasting for at least 8 hours.   BUN 10 6 - 20 mg/dL   Creatinine, Ser 0.61 0.44 - 1.00 mg/dL   Calcium 9.1 8.9 - 10.3 mg/dL   GFR, Estimated >60 >60 mL/min    Comment: (NOTE) Calculated using the CKD-EPI Creatinine Equation (2021)    Anion gap 10 5 - 15    Comment: Performed at [facility], [place] [address], [place], [place] [address]  CBC per protocol     Status: Abnormal   Collection Time: 10/03/20 10:02 AM  Result Value Ref Range   WBC 17.1 (H) 4.0 - 10.5 K/uL   RBC 4.80 3.87 - 5.11 MIL/uL   Hemoglobin 12.7 12.0 - 15.0 g/dL   HCT 38.3 36.0 - 46.0 %   MCV 79.8 (L) 80.0 - 100.0 fL   MCH 26.5 26.0 - 34.0 pg   MCHC 33.2 30.0 - 36.0 g/dL   RDW 14.1 11.5 - 15.5 %   Platelets 330 150 - 400 K/uL   nRBC 0.0 0.0 - 0.2 %    Comment: Performed at [facility], [place] [address], [place], [place] [address]  Hemoglobin A1c per protocol     Status: Abnormal   Collection Time: 10/03/20 10:02 AM  Result Value Ref Range   Hgb A1c MFr Bld 7.6 (H) 4.8 - 5.6 %    Comment: (NOTE) Pre diabetes:          5.7%-6.4%  Diabetes:              >6.4%  Glycemic control for   <7.0% adults with diabetes    Mean Plasma Glucose 171.42 mg/dL    Comment: Performed at [place] [address] [place], [place] [address]  SARS CORONAVIRUS 2 (TAT 6-24 HRS) Nasopharyngeal Nasopharyngeal Swab     Status: None    Collection Time: 10/08/20  9:55 AM   Specimen: Nasopharyngeal Swab  Result Value Ref Range   SARS Coronavirus 2 NEGATIVE NEGATIVE    Comment: (NOTE) SARS-CoV-2 target nucleic acids are NOT DETECTED.  The SARS-CoV-2 RNA is generally detectable in upper and lower respiratory specimens during the acute phase of infection. Negative results do not preclude SARS-CoV-2 infection, do not rule out co-infections with other pathogens, and should not be used as the sole basis for treatment or other patient management decisions. Negative results must be combined with clinical observations, patient history, and epidemiological information. The expected result is Negative.  Fact Sheet for Patients: SugarRoll.be  Fact Sheet for Healthcare Providers: https://www.woods-mathews.com/  This test is not yet approved or cleared by the [place] FDA and  has been authorized for detection and/or diagnosis of SARS-CoV-2 by FDA under an Emergency Use Authorization (EUA). This EUA will remain  in effect (meaning this test can be used) for the duration of the COVID-19 declaration under Se ction 564(b)(1) of the Act, 21 U.S.C. section 360bbb-3(b)(1), unless the authorization is terminated or revoked sooner.  Performed at [facility], [place] [address] [place],  [address]   Pregnancy, urine per protocol     Status: None   Collection Time: 10/12/20  6:42 AM  Result Value Ref Range   Preg Test, Ur NEGATIVE NEGATIVE    Comment:        THE SENSITIVITY OF THIS METHODOLOGY IS >20 mIU/mL. Performed at [facility], [place] [address] [place], [place] [address]   Glucose, capillary     Status: Abnormal   Collection Time: 10/12/20  6:59 AM  Result Value Ref Range   Glucose-Capillary 187 (H) 70 - 99 mg/dL    Comment: Glucose reference range applies only to samples taken after fasting for at least 8 hours.   Comment 1 Notify RN    Comment  2 Document in Chart   TSH     Status: None   Collection Time: 10/27/20  1:15 PM  Result Value Ref Range   TSH 1.74 0.35 - 4.50 uIU/mL  Lipid panel     Status: Abnormal   Collection Time: 10/27/20  1:15 PM  Result Value Ref Range   Cholesterol 217 (H) 0 - 200 mg/dL    Comment: ATP III Classification       Desirable:  < 200 mg/dL               Borderline High:  200 - 239 mg/dL          High:  > = 240 mg/dL   Triglycerides 139.0 0.0 - 149.0 mg/dL    Comment: Normal:  <150 mg/dLBorderline High:  150 - 199 mg/dL   HDL 40.50 >39.00 mg/dL   VLDL 27.8 0.0 - 40.0 mg/dL   LDL Cholesterol 149 (H) 0 - 99 mg/dL   Total CHOL/HDL Ratio 5     Comment:                Men          Women1/2 Average Risk     3.4          3.3Average Risk          5.0          4.42X Average Risk          9.6          7.13X Average Risk          15.0          11.0                       NonHDL 176.30     Comment: NOTE:  Non-HDL goal should be 30 mg/dL higher than patient's LDL goal (i.e. LDL goal of < 70 mg/dL, would have non-HDL goal of < 100 mg/dL)  Microalbumin / creatinine urine ratio     Status: Abnormal   Collection Time: 10/27/20  1:23 PM  Result Value Ref Range   Microalb, Ur 7.6 (H) 0.0 - 1.9 mg/dL   Creatinine,U 193.4 mg/dL   Microalb Creat Ratio 3.9 0.0 - 30.0 mg/g    Diabetic Foot Exam: Diabetic Foot Exam - Simple   No data filed    ***  PHQ2/9: Depression screen [facility] 2/9 08/15/2020 07/13/2020 06/30/2020 05/30/2020 11/12/2019  Decreased Interest 0 0 0 0 0  Down, Depressed, Hopeless 0 0 0 0 0  PHQ - 2 Score 0 0 0 0 0  Altered sleeping - - 0 0 0  Tired, decreased energy - - 0 0 0  Change in appetite - - 0 0 0  Feeling bad or failure about yourself  - - 0 0 0  Trouble concentrating - - 0 0 0  Moving slowly or fidgety/restless - - 0 0 0  Suicidal thoughts - - 0 0 0  PHQ-9 Score - - 0 0 0  Difficult doing work/chores - - - - -  Some recent data might be hidden    phq 9 is {gen pos OFH:219758} ***  Fall  Risk: Fall Risk  08/15/2020 07/13/2020 06/30/2020 05/30/2020 05/03/2020  Falls in the past year? 1 1 1  0 0  Number falls in past yr: 1 1 0 0 -  Injury with Fall? 1 1 1  0 -  Comment - - - - -  Risk for fall due to : History of fall(s) - - - -  Follow up - Falls evaluation completed - - -   ***   Functional Status Survey:   ***   Assessment & Plan  *** There are no diagnoses linked to this encounter.

## 2020-11-16 ENCOUNTER — Other Ambulatory Visit: Payer: 59

## 2020-11-18 ENCOUNTER — Telehealth: Payer: Self-pay | Admitting: Family Medicine

## 2020-11-18 ENCOUNTER — Encounter: Payer: Self-pay | Admitting: Family Medicine

## 2020-11-18 NOTE — Telephone Encounter (Signed)
Patient is calling because she tested positvie for COVID. Patient is reqeuting referral for for the monoclonal antibody infusion? Would patient need virtual appt for the referral? Please advise   CB- [phone]

## 2020-11-19 ENCOUNTER — Other Ambulatory Visit: Payer: Self-pay

## 2020-11-19 ENCOUNTER — Ambulatory Visit
Admission: EM | Admit: 2020-11-19 | Discharge: 2020-11-19 | Disposition: A | Discharge status: Home / Self Care | Payer: 59 | Attending: Emergency Medicine | Admitting: Emergency Medicine

## 2020-11-19 ENCOUNTER — Encounter: Payer: Self-pay | Admitting: *Deleted

## 2020-11-19 DIAGNOSIS — R058 Other specified cough: Secondary | ICD-10-CM

## 2020-11-19 DIAGNOSIS — Z20822 Contact with and (suspected) exposure to covid-19: Secondary | ICD-10-CM | POA: Diagnosis not present

## 2020-11-19 DIAGNOSIS — Z1152 Encounter for screening for COVID-19: Secondary | ICD-10-CM

## 2020-11-19 LAB — POCT URINALYSIS DIP (MANUAL ENTRY)
Bilirubin, UA: NEGATIVE
Glucose, UA: NEGATIVE mg/dL
Ketones, POC UA: NEGATIVE mg/dL
Leukocytes, UA: NEGATIVE
Nitrite, UA: NEGATIVE
Protein Ur, POC: NEGATIVE mg/dL
Spec Grav, UA: 1.025 (ref 1.010–1.025)
Urobilinogen, UA: 0.2 E.U./dL
pH, UA: 6 (ref 5.0–8.0)

## 2020-11-19 MED ORDER — CETIRIZINE HCL 10 MG PO TABS: 10.0000 mg | ORAL_TABLET | Freq: Every day | ORAL | 0 refills | Status: DC

## 2020-11-19 MED ORDER — FLUTICASONE PROPIONATE 50 MCG/ACT NA SUSP: 1.0000 | Freq: Every day | NASAL | 0 refills | Status: DC

## 2020-11-19 MED ORDER — BENZONATATE 100 MG PO CAPS: 100.0000 mg | ORAL_CAPSULE | Freq: Three times a day (TID) | ORAL | 0 refills | Status: DC

## 2020-11-19 MED ORDER — ALBUTEROL SULFATE HFA 108 (90 BASE) MCG/ACT IN AERS: 2.0000 | INHALATION_SPRAY | Freq: Four times a day (QID) | RESPIRATORY_TRACT | 2 refills | Status: DC | PRN

## 2020-11-19 MED ORDER — AEROCHAMBER PLUS FLO-VU MEDIUM MISC: 1.0000 | Freq: Once | 0 refills | Status: AC

## 2020-11-19 NOTE — Discharge Instructions (Signed)

## 2020-11-19 NOTE — ED Provider Notes (Signed)
[name]    CSN: [id number] Arrival date & time: 11/19/20  0841      History   Chief Complaint Chief Complaint  Patient presents with  . Cough  . Nasal Congestion    HPI Erin Good is a 45 y.o. female  History as below presenting for COVID-like symptoms since yesterday.  [place] nasal congestion, cough.  States her daughter had tested positive.  Requesting PCR to confirm.  Currently working with her PCP, please see chart, on infusion clinic referral.  No difficulty breathing, chest pain, fever.  Past Medical History:  Diagnosis Date  . Allergic rhinitis   . Breast discharge 06/05/2017   2 weeks ago left  . Breast mass 12/06/2016   left  . Diabetes mellitus without complication ([place])   . Dyslipidemia   . Epilepsy ([place])   . Epilepsy ([place])   . Febrile seizures ([place])   . Galactorrhea   . Hx gestational diabetes   . Hypertension   . Kidney stones   . Obesity   . Seizures ([place])   . Sleep apnea   . Syncope and collapse   . Tachycardia     Patient Active Problem List   Diagnosis Date Noted  . Impingement syndrome of left shoulder region 04/20/2020  . Vitamin D deficiency 01/31/2017  . Right shoulder tendinitis 01/31/2017  . Tendinosis 01/31/2017  . Bell's palsy 12/19/2016  . Tachycardia 09/30/2016  . Depression with anxiety 05/06/2015  . Allergic rhinitis 04/20/2015  . Anxiety and depression 04/20/2015  . 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 Gastro-esophageal reflux disease without esophagitis 04/20/2015  . Dysmetabolic syndrome 30/86/5784  . Extreme obesity 04/20/2015  . NASH (nonalcoholic steatohepatitis) 04/20/2015  . Allergy to nuts 04/20/2015  . Calculus of kidney 04/20/2015  . Type 2 diabetes mellitus with renal manifestations ([place]) 04/20/2015  . Central sleep apnea 11/26/2008  . Dyslipidemia 07/02/2008  . Leukocytosis 07/29/2007    Past Surgical History:  Procedure Laterality Date  . BREAST BIOPSY Left 2018    benign  . CESAREAN SECTION     X 2  . IRRIGATION AND DEBRIDEMENT SHOULDER Left 10/12/2020   Procedure: IRRIGATION AND DEBRIDEMENT SHOULDER;  Surgeon: Hiram Gash, MD;  Location: [facility];  Service: Orthopedics;  Laterality: Left;  . RIGHT OOPHORECTOMY Right 2001   benign tumor  . TUBAL LIGATION  2007    OB History    Gravida  2   Para  2   Term      Preterm  2   AB      Living  2     SAB      IAB      Ectopic      Multiple      Live Births  2        Obstetric Comments  Menstrual age: 64  Age 1st Pregnancy: 60           Home Medications    Prior to Admission medications   Medication Sig Start Date End Date Taking? Authorizing Provider  albuterol (VENTOLIN HFA) 108 (90 Base) MCG/ACT inhaler Inhale 2 puffs into the lungs every 6 (six) hours as needed for wheezing or shortness of breath. 11/19/20  Yes Hall-Potvin, [place], PA-C  benzonatate (TESSALON) 100 MG capsule Take 1 capsule (100 mg total) by mouth every 8 (eight) hours. 11/19/20  Yes Hall-Potvin, [place], PA-C  cetirizine (ZYRTEC ALLERGY) 10 MG tablet Take 1 tablet (10 mg total) by mouth daily. 11/19/20  Yes Hall-Potvin, [place], PA-C  fluticasone (FLONASE) 50 MCG/ACT nasal spray Place 1 spray into both nostrils daily. 11/19/20  Yes Hall-Potvin, [place], PA-C  Spacer/Aero-Holding Chambers (AEROCHAMBER PLUS FLO-VU MEDIUM) MISC 1 each by Other route once for 1 dose. 11/19/20 11/19/20 Yes Hall-Potvin, [place], PA-C  atenolol (TENORMIN) 25 MG tablet TAKE 1 TABLET BY MOUTH EVERY DAY Patient not taking: Reported on 09/26/2020 06/22/20   Steele Sizer, MD  clonazePAM (KLONOPIN) 0.5 MG tablet Take 0.5 mg by mouth daily as needed (Seizures).  08/14/16   [provider]  Continuous Blood Gluc Sensor (FREESTYLE LIBRE 2 SENSOR) MISC 1 each by Does not apply route every 14 (fourteen) days. Patient not taking: Reported on 08/15/2020 06/30/20   Steele Sizer, MD  diclofenac (VOLTAREN) 75 MG EC tablet Take 1  tablet (75 mg total) by mouth 2 (two) times daily. 10/12/20   McBane, Maylene Roes, PA-C  EPINEPHrine (EPIPEN 2-PAK) 0.3 mg/0.3 mL IJ SOAJ injection Inject 0.3 mg into the muscle as needed for anaphylaxis (for anaphylaxis).  06/14/14   [provider]  gabapentin (NEURONTIN) 100 MG capsule Take 100 mg by mouth 3 (three) times daily. 10/21/20   [provider]  insulin glargine (LANTUS SOLOSTAR) 100 UNIT/ML Solostar Pen Inject 60 Units into the skin at bedtime. 10/27/20   Philemon Kingdom, MD  Insulin Pen Needle 32G X 4 MM MISC Use 1x a day 07/03/19   Philemon Kingdom, MD  metFORMIN (GLUCOPHAGE-XR) 500 MG 24 hr tablet Take 4 tablets (2,000 mg total) by mouth daily with supper. 10/27/20   Philemon Kingdom, MD  montelukast (SINGULAIR) 10 MG tablet TAKE 1 TABLET BY MOUTH EVERYDAY AT BEDTIME Patient taking differently: Take 10 mg by mouth at bedtime.  02/20/20   Steele Sizer, MD  OneTouch Delica Lancets 81E MISC Use 2x a day with OneTouch Verio Flex 07/03/19   Philemon Kingdom, MD  [facility] VERIO test strip USE 2X A DAY WITH ONETOUCH VERIO FLEX 08/01/20   Philemon Kingdom, MD  OZEMPIC, 1 MG/DOSE, 4 MG/3ML SOPN Inject 1 mL into the skin once a week. 08/31/20   [provider]  rosuvastatin (CRESTOR) 20 MG tablet Take 1 tablet (20 mg total) by mouth daily. 10/31/20   Philemon Kingdom, MD  tiZANidine (ZANAFLEX) 2 MG tablet Take 2 mg by mouth at bedtime as needed for muscle spasms.  06/07/20   [provider]  Vitamin D, Ergocalciferol, (DRISDOL) 1.25 MG (50000 UNIT) CAPS capsule TAKE 1 CAPSULE (50,000 UNITS TOTAL) BY MOUTH EVERY 7 (SEVEN) DAYS. Patient taking differently: Take 50,000 Units by mouth every Friday.  06/13/20   Steele Sizer, MD  zonisamide (ZONEGRAN) 100 MG capsule Take 400 mg by mouth at bedtime.     Roque Cash, MD  zonisamide (ZONEGRAN) 50 MG capsule Take by mouth. 10/27/20   [provider]    Family History Family History  Problem Relation  Age of Onset  . Diabetes Mother   . Breast cancer Paternal Grandmother 62  . Cancer Paternal Grandmother   . Cancer Paternal Aunt   . Cancer Maternal Grandmother   . Heart disease Neg Hx     Social History Social History   Tobacco Use  . Smoking status: Never Smoker  . Smokeless tobacco: Never Used  Vaping Use  . Vaping Use: Never used  Substance Use Topics  . Alcohol use: Yes    Comment: occass  . Drug use: No     Allergies   Peanuts [peanut oil] and Aspirin  Review of Systems Review of Systems  Constitutional: Negative for fatigue and fever.  HENT: Positive for congestion. Negative for dental problem, ear pain, facial swelling, hearing loss, sinus pain, sore throat, trouble swallowing and voice change.   Eyes: Negative for photophobia, pain and visual disturbance.  Respiratory: Positive for cough. Negative for shortness of breath and wheezing.   Cardiovascular: Negative for chest pain and palpitations.  Gastrointestinal: Negative for diarrhea and vomiting.  Musculoskeletal: Negative for arthralgias and myalgias.  Neurological: Negative for dizziness and headaches.     Physical Exam Triage Vital Signs ED Triage Vitals  Enc Vitals Group     BP 11/19/20 0940 (!) 153/84     Pulse Rate 11/19/20 0940 (!) 110     Resp 11/19/20 0940 20     Temp 11/19/20 0940 98.9 F (37.2 C)     Temp Source 11/19/20 0940 Oral     SpO2 11/19/20 0940 97 %     Weight --      Height --      Head Circumference --      Peak Flow --      Pain Score 11/19/20 0941 3     Pain Loc --      Pain Edu? --      Excl. in [place]? --    No data found.  Updated Vital Signs BP (!) 153/84 (BP Location: Left Arm)   Pulse (!) 110   Temp 98.9 F (37.2 C) (Oral)   Resp 20   LMP 11/12/2020   SpO2 97%   Visual Acuity Right Eye Distance:   Left Eye Distance:   Bilateral Distance:    Right Eye Near:   Left Eye Near:    Bilateral Near:     Physical Exam Constitutional:      General: She is  not in acute distress.    Appearance: She is not ill-appearing or diaphoretic.  HENT:     Head: Normocephalic and atraumatic.     Right Ear: Tympanic membrane and ear canal normal.     Left Ear: Tympanic membrane and ear canal normal.     Mouth/Throat:     Mouth: Mucous membranes are moist.     Pharynx: Oropharynx is clear. No oropharyngeal exudate or posterior oropharyngeal erythema.  Eyes:     General: No scleral icterus.    Conjunctiva/sclera: Conjunctivae normal.     Pupils: Pupils are equal, round, and reactive to light.  Neck:     Comments: Trachea midline, negative JVD Cardiovascular:     Rate and Rhythm: Regular rhythm. Tachycardia present.     Heart sounds: No murmur heard. No gallop.   Pulmonary:     Effort: Pulmonary effort is normal. No respiratory distress.     Breath sounds: No wheezing, rhonchi or rales.  Musculoskeletal:     Cervical back: Neck supple. No tenderness.  Lymphadenopathy:     Cervical: No cervical adenopathy.  Skin:    Capillary Refill: Capillary refill takes less than 2 seconds.     Coloration: Skin is not jaundiced or pale.     Findings: No rash.  Neurological:     General: No focal deficit present.     Mental Status: She is alert and oriented to person, place, and time.      UC Treatments / Results  Labs (all labs ordered are listed, but only abnormal results are displayed) Labs Reviewed  POCT URINALYSIS DIP (MANUAL ENTRY) - Abnormal; Notable for the following components:  Result Value   Blood, UA trace-intact (*)    All other components within normal limits  NOVEL CORONAVIRUS, NAA    EKG   Radiology No results found.  Procedures Procedures (including critical care time)  Medications Ordered in UC Medications - No data to display  Initial Impression / Assessment and Plan / UC Course  I have reviewed the triage vital signs and the nursing notes.  Pertinent labs & imaging results that were available during my care of the  patient were reviewed by me and considered in my medical decision making (see chart for details).     Patient afebrile, nontoxic, with SpO2 97%.  Covid PCR pending.  Patient to quarantine until results are back.  We will treat supportively as outlined below.  Return precautions discussed, patient verbalized understanding and is agreeable to plan. Final Clinical Impressions(s) / UC Diagnoses   Final diagnoses:  Encounter for screening for COVID-19  Cough with exposure to COVID-19 virus     Discharge Instructions     Tessalon for cough. Start flonase, atrovent nasal spray for nasal congestion/drainage. You can use over the counter nasal saline rinse such as neti pot for nasal congestion. Keep hydrated, your urine should be clear to pale yellow in color. Tylenol/motrin for fever and pain. Monitor for any worsening of symptoms, chest pain, shortness of breath, wheezing, swelling of the throat, go to the emergency department for further evaluation needed.     ED Prescriptions    Medication Sig Dispense Auth. Provider   albuterol (VENTOLIN HFA) 108 (90 Base) MCG/ACT inhaler Inhale 2 puffs into the lungs every 6 (six) hours as needed for wheezing or shortness of breath. 8 g Hall-Potvin, [place], PA-C   Spacer/Aero-Holding Chambers (AEROCHAMBER PLUS FLO-VU MEDIUM) MISC 1 each by Other route once for 1 dose. 1 each Hall-Potvin, [place], PA-C   benzonatate (TESSALON) 100 MG capsule Take 1 capsule (100 mg total) by mouth every 8 (eight) hours. 21 capsule Hall-Potvin, [place], PA-C   cetirizine (ZYRTEC ALLERGY) 10 MG tablet Take 1 tablet (10 mg total) by mouth daily. 30 tablet Hall-Potvin, [place], PA-C   fluticasone (FLONASE) 50 MCG/ACT nasal spray Place 1 spray into both nostrils daily. 16 g Hall-Potvin, [place], PA-C     PDMP not reviewed this encounter.   Hall-Potvin, [place], [place] 11/19/20 1027

## 2020-11-19 NOTE — ED Triage Notes (Signed)
Pt reports nasal congestion and cough since yesterday

## 2020-11-21 ENCOUNTER — Telehealth (INDEPENDENT_AMBULATORY_CARE_PROVIDER_SITE_OTHER): Payer: 59 | Admitting: Family Medicine

## 2020-11-21 ENCOUNTER — Encounter: Payer: Self-pay | Admitting: Family Medicine

## 2020-11-21 DIAGNOSIS — R809 Proteinuria, unspecified: Secondary | ICD-10-CM

## 2020-11-21 DIAGNOSIS — E1129 Type 2 diabetes mellitus with other diabetic kidney complication: Secondary | ICD-10-CM

## 2020-11-21 DIAGNOSIS — G40409 Other generalized epilepsy and epileptic syndromes, not intractable, without status epilepticus: Secondary | ICD-10-CM

## 2020-11-21 DIAGNOSIS — J40 Bronchitis, not specified as acute or chronic: Secondary | ICD-10-CM | POA: Diagnosis not present

## 2020-11-21 DIAGNOSIS — U071 COVID-19: Secondary | ICD-10-CM

## 2020-11-21 DIAGNOSIS — E785 Hyperlipidemia, unspecified: Secondary | ICD-10-CM

## 2020-11-21 DIAGNOSIS — J988 Other specified respiratory disorders: Secondary | ICD-10-CM

## 2020-11-21 DIAGNOSIS — Z794 Long term (current) use of insulin: Secondary | ICD-10-CM

## 2020-11-21 DIAGNOSIS — G4731 Primary central sleep apnea: Secondary | ICD-10-CM

## 2020-11-21 MED ORDER — PREDNISONE 20 MG PO TABS: 40.0000 mg | ORAL_TABLET | Freq: Every day | ORAL | 0 refills | Status: AC

## 2020-11-21 NOTE — Progress Notes (Signed)
Name: Erin Good   MRN: [id number]    DOB: 1976/06/21   Date:11/21/2020       Progress Note  Subjective:    Chief Complaint  Chief Complaint  Patient presents with  . Covid Positive    Positive home test on 11/18/20. Symptoms are shortness of breath, congestion, and fever. Onset 11/18/2020.    I connected with  Makayela Secrest Gant-Pegues  on 11/21/20 at  2:20 PM EST by a video enabled telemedicine application and verified that I am speaking with the correct person using two identifiers.  I discussed the limitations of evaluation and management by telemedicine and the availability of in person appointments. The patient expressed understanding and agreed to proceed. Staff also discussed with the patient that there may be a patient responsible charge related to this service. Patient Location: home Provider Location:  [facility]  Additional Individuals present: none  HPI Pt presents with URI sx congestion, HA x 3-4 days Her daughter was positive last Monday a week ago She has been testing at home daily for the past week, but was positive on 11/18/2020 - 3 days ago with onset of sx.  2 d ago her chest congestion and SOB worsened, fever   Pt is having CP and SOB with any activity around her house, she went to [facility] on Saturday7 (2 d ago) was evaluated and discharged, prescribed albuterol inhaler, tessalon, nasal spray and an antihistamine.  She is using albuterol inhaler only 2x a day, helps a little, she is also resting, using mucinex and the prescribed meds.  She continues to have coughing fits feels some tightness and shortness of breath with coughing and with activity, no chest pain at rest, no near syncope.  She has history of epilepsy, morbid obesity, diabetes, sleep apnea, hypertension/dyslipidemia.  She is worried that she is high risk and she is concerned that she may have seizures due to being ill and having fevers.  Reviewed her chart and most recent labs Diabetes is insulin-dependent,  uncontrolled, last A1c 7.6  She did get vaccinated for COVID x2 but did not do booster due to a shoulder surgery that was last month.  She does see neurology with [facility] and is managed with Zonegran  Last OV with PCP Dr. Ancil Boozer was 08/2020 - reviewed today -Dr. Ancil Boozer reviewed shoulder pain and did preop clearance she reviewed her diabetes Leukocytosis-was previously referred to hematology, blood levels were going to be checked prior to her surgery, she was referred to cardiology for tachycardia pending Holter monitor results  Last OV with endocrinology 10/27/2020 - reviewed today -uncontrolled but improving A1c managed on metformin, Lantus and Ozempic, history of microalbuminuria and also managed by endocrinology for thyroid nodules    Patient Active Problem List   Diagnosis Date Noted  . Impingement syndrome of left shoulder region 04/20/2020  . Vitamin D deficiency 01/31/2017  . Right shoulder tendinitis 01/31/2017  . Tendinosis 01/31/2017  . Bell's palsy 12/19/2016  . Tachycardia 09/30/2016  . Depression with anxiety 05/06/2015  . Allergic rhinitis 04/20/2015  . Anxiety and depression 04/20/2015  . 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 Gastro-esophageal reflux disease without esophagitis 04/20/2015  . Dysmetabolic syndrome 72/07/4708  . Extreme obesity 04/20/2015  . NASH (nonalcoholic steatohepatitis) 04/20/2015  . Allergy to nuts 04/20/2015  . Calculus of kidney 04/20/2015  . Type 2 diabetes mellitus with renal manifestations ([place]) 04/20/2015  . Central sleep apnea 11/26/2008  . Dyslipidemia 07/02/2008  . Leukocytosis 07/29/2007  Social History   Tobacco Use  . Smoking status: Never Smoker  . Smokeless tobacco: Never Used  Substance Use Topics  . Alcohol use: Yes    Comment: occass     Current Outpatient Medications:  .  albuterol (VENTOLIN HFA) 108 (90 Base) MCG/ACT inhaler, Inhale 2 puffs into the lungs every 6 (six) hours as needed for  wheezing or shortness of breath., Disp: 8 g, Rfl: 2 .  benzonatate (TESSALON) 100 MG capsule, Take 1 capsule (100 mg total) by mouth every 8 (eight) hours., Disp: 21 capsule, Rfl: 0 .  cetirizine (ZYRTEC ALLERGY) 10 MG tablet, Take 1 tablet (10 mg total) by mouth daily., Disp: 30 tablet, Rfl: 0 .  clonazePAM (KLONOPIN) 0.5 MG tablet, Take 0.5 mg by mouth daily as needed (Seizures). , Disp: , Rfl: 3 .  diclofenac (VOLTAREN) 75 MG EC tablet, Take 1 tablet (75 mg total) by mouth 2 (two) times daily., Disp: 60 tablet, Rfl: 0 .  EPINEPHrine 0.3 mg/0.3 mL IJ SOAJ injection, Inject 0.3 mg into the muscle as needed for anaphylaxis (for anaphylaxis). , Disp: , Rfl:  .  fluticasone (FLONASE) 50 MCG/ACT nasal spray, Place 1 spray into both nostrils daily., Disp: 16 g, Rfl: 0 .  gabapentin (NEURONTIN) 100 MG capsule, Take 100 mg by mouth 3 (three) times daily., Disp: , Rfl:  .  insulin glargine (LANTUS SOLOSTAR) 100 UNIT/ML Solostar Pen, Inject 60 Units into the skin at bedtime., Disp: 30 mL, Rfl: 3 .  Insulin Pen Needle 32G X 4 MM MISC, Use 1x a day, Disp: 100 each, Rfl: 3 .  metFORMIN (GLUCOPHAGE-XR) 500 MG 24 hr tablet, Take 4 tablets (2,000 mg total) by mouth daily with supper., Disp: 360 tablet, Rfl: 3 .  montelukast (SINGULAIR) 10 MG tablet, TAKE 1 TABLET BY MOUTH EVERYDAY AT BEDTIME (Patient taking differently: Take 10 mg by mouth at bedtime.), Disp: 90 tablet, Rfl: 0 .  OneTouch Delica Lancets 01U MISC, Use 2x a day with OneTouch Verio Flex, Disp: 200 each, Rfl: 3 .  ONETOUCH VERIO test strip, USE 2X A DAY WITH ONETOUCH VERIO FLEX, Disp: 50 strip, Rfl: 15 .  OZEMPIC, 1 MG/DOSE, 4 MG/3ML SOPN, Inject 1 mL into the skin once a week., Disp: , Rfl:  .  rosuvastatin (CRESTOR) 20 MG tablet, Take 1 tablet (20 mg total) by mouth daily., Disp: 90 tablet, Rfl: 3 .  tiZANidine (ZANAFLEX) 2 MG tablet, Take 2 mg by mouth at bedtime as needed for muscle spasms. , Disp: , Rfl:  .  Vitamin D, Ergocalciferol, (DRISDOL)  1.25 MG (50000 UNIT) CAPS capsule, TAKE 1 CAPSULE (50,000 UNITS TOTAL) BY MOUTH EVERY 7 (SEVEN) DAYS. (Patient taking differently: Take 50,000 Units by mouth every Friday.), Disp: 4 capsule, Rfl: 2 .  zonisamide (ZONEGRAN) 100 MG capsule, Take 400 mg by mouth at bedtime. , Disp: , Rfl:  .  zonisamide (ZONEGRAN) 50 MG capsule, Take by mouth., Disp: , Rfl:  .  atenolol (TENORMIN) 25 MG tablet, TAKE 1 TABLET BY MOUTH EVERY DAY (Patient not taking: No sig reported), Disp: 30 tablet, Rfl: 0 .  Continuous Blood Gluc Sensor (FREESTYLE LIBRE 2 SENSOR) MISC, 1 each by Does not apply route every 14 (fourteen) days. (Patient not taking: No sig reported), Disp: 2 each, Rfl: 5  Allergies  Allergen Reactions  . Peanuts [Peanut Oil] Anaphylaxis  . Aspirin Other (See Comments)    Does not take because of her epilepsy/seizure     Chart Review: I personally  reviewed active problem list, medication list, allergies, family history, social history, health maintenance, notes from last encounter, lab results, imaging with the patient/caregiver today.   Review of Systems  Reason unable to perform ROS: Systems reviewed and otherwise negative except for noted in HPI.  Constitutional: Negative.   HENT: Negative.   Eyes: Negative.   Respiratory: Negative.   Cardiovascular: Negative.   Gastrointestinal: Negative.   Endocrine: Negative.   Genitourinary: Negative.   Musculoskeletal: Negative.   Skin: Negative.   Allergic/Immunologic: Negative.   Neurological: Negative.   Hematological: Negative.   Psychiatric/Behavioral: Negative.   All other systems reviewed and are negative.     Objective:   Virtual encounter, vitals limited, only able to obtain the following There were no vitals filed for this visit. There is no height or weight on file to calculate BMI. Nursing Note and Vital Signs reviewed.  Physical Exam Vitals and nursing note reviewed.  Constitutional:      General: She is not in acute  distress.    Appearance: She is obese. She is not ill-appearing, toxic-appearing or diaphoretic.  Pulmonary:     Effort: Pulmonary effort is normal. No respiratory distress.     Comments: Intermittent coughing, no tachypnea, retractions, accessory muscle use visualized no audible wheeze or stridor, able to speak in full and complete sentences Neurological:     Mental Status: She is alert.  Psychiatric:        Mood and Affect: Mood normal.        Behavior: Behavior normal.     PE limited by telephone encounter  Results for orders placed or performed during the hospital encounter of 11/19/20 (from the past 72 hour(s))  POCT urinalysis dipstick     Status: Abnormal   Collection Time: 11/19/20  9:49 AM  Result Value Ref Range   Color, UA yellow yellow   Clarity, UA clear clear   Glucose, UA negative negative mg/dL   Bilirubin, UA negative negative   Ketones, POC UA negative negative mg/dL   Spec Grav, UA 1.025 1.010 - 1.025   Blood, UA trace-intact (A) negative   pH, UA 6.0 5.0 - 8.0   Protein Ur, POC negative negative mg/dL   Urobilinogen, UA 0.2 0.2 or 1.0 E.U./dL   Nitrite, UA Negative Negative   Leukocytes, UA Negative Negative    Assessment and Plan:     ICD-10-CM   1. Respiratory tract infection due to COVID-19 virus  U07.1 Ambulatory referral for Covid Treatment   J98.8    higher risk with COVID due to multiple comorbidities - close f/up - pt encouraged to contact us if not improving in the next ~3 d, respiratory clinic eval?  I did encourage patient to continue her current over-the-counter and supportive medications also continue what was prescribed by urgent care I did add prednisone with the patient's report of multiple past respiratory infections requiring inhaler use -I do not see this reflected in the chart after subsequently reviewing her medication history  Encouraged her to take steroids and follow-up with clinic if not improving in 2 to 3 days -could possibly  get her a nebulizer machine or we can set up an evaluation for her in person at the respiratory clinic    2. Bronchitis  J40 predniSONE (DELTASONE) 20 MG tablet   hx of respiratory illness requiring inhalers, but no formal dx in the past of asthma or COPD? add steroids and continue inhaler and other cough meds    Patient has multiple  comorbidities as noted below -she is vaccinated x2, could not get the booster due to a recent surgery, she is concerned about having a seizure -reviewed the chart it does seem that she has been lost to follow-up with [facility] Did refer her for to the outpatient COVID treatment team for evaluation if she meets requirements for outpatient treatment explained to her that she would likely be contacted in the next couple days. I did explain that there is a shortage of medications and a spike and increase in sick patients -I am not sure if she will qualify for this treatment   3. Type 2 diabetes mellitus with microalbuminuria, with long-term current use of insulin (HCC)  E11.29   Uncontrolled IDDM R80.9    Z79.4   4. Dyslipidemia  E78.5   5. Morbid obesity ([place])  E66.01   6. 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 Central sleep apnea  G47.31     Patient was recently seen in person was cleared by urgent care to discharge home, her respiratory symptoms do sound bothersome with congestion, frequent cough, some chest pains and shortness of breath encouraged her to add the steroids and use her inhaler more often and continue to monitor. Reviewed red flags and concerning signs and symptoms which she should return to urgent care or the ER for evaluation  If her symptoms continue but do not worsen and do not warrant emergent evaluation we might set her up with respiratory clinic to be evaluated in person  -Red flags and when to present for emergency care or RTC including chest pain,  shortness of breath, new/worsening/un-resolving symptoms, reviewed with patient at time of visit. Follow up and care instructions discussed and provided in AVS. - I discussed the assessment and treatment plan with the patient. The patient was provided an opportunity to ask questions and all were answered. The patient agreed with the plan and demonstrated an understanding of the instructions.  I provided 30+ minutes of non-face-to-face time during this encounter.  Delsa Grana, PA-C 11/21/20 3:04 PM

## 2020-11-22 ENCOUNTER — Encounter: Payer: Self-pay | Admitting: Nurse Practitioner

## 2020-11-22 ENCOUNTER — Other Ambulatory Visit: Payer: Self-pay | Admitting: Family Medicine

## 2020-11-22 ENCOUNTER — Other Ambulatory Visit: Payer: Self-pay | Admitting: Nurse Practitioner

## 2020-11-22 ENCOUNTER — Ambulatory Visit (HOSPITAL_COMMUNITY)
Admission: RE | Admit: 2020-11-22 | Discharge: 2020-11-22 | Disposition: A | Discharge status: Home / Self Care | Payer: 59 | Source: Ambulatory Visit | Attending: Pulmonary Disease | Admitting: Pulmonary Disease

## 2020-11-22 DIAGNOSIS — E119 Type 2 diabetes mellitus without complications: Secondary | ICD-10-CM

## 2020-11-22 DIAGNOSIS — I10 Essential (primary) hypertension: Secondary | ICD-10-CM | POA: Insufficient documentation

## 2020-11-22 DIAGNOSIS — U071 COVID-19: Secondary | ICD-10-CM | POA: Insufficient documentation

## 2020-11-22 LAB — NOVEL CORONAVIRUS, NAA: SARS-CoV-2, NAA: DETECTED — AB

## 2020-11-22 MED ORDER — METHYLPREDNISOLONE SODIUM SUCC 125 MG IJ SOLR: 125.0000 mg | Freq: Once | INTRAMUSCULAR | Status: DC | PRN

## 2020-11-22 MED ORDER — EPINEPHRINE 0.3 MG/0.3ML IJ SOAJ: 0.3000 mg | Freq: Once | INTRAMUSCULAR | Status: DC | PRN

## 2020-11-22 MED ORDER — SOTROVIMAB 500 MG/8ML IV SOLN
500.0000 mg | Freq: Once | INTRAVENOUS | Status: AC
  Administered 2020-11-22: 500 mg via INTRAVENOUS

## 2020-11-22 MED ORDER — SODIUM CHLORIDE 0.9 % IV SOLN: INTRAVENOUS | Status: DC | PRN

## 2020-11-22 MED ORDER — ALBUTEROL SULFATE HFA 108 (90 BASE) MCG/ACT IN AERS: 2.0000 | INHALATION_SPRAY | Freq: Once | RESPIRATORY_TRACT | Status: DC | PRN

## 2020-11-22 MED ORDER — DIPHENHYDRAMINE HCL 50 MG/ML IJ SOLN: 50.0000 mg | Freq: Once | INTRAMUSCULAR | Status: DC | PRN

## 2020-11-22 MED ORDER — FAMOTIDINE IN NACL 20-0.9 MG/50ML-% IV SOLN: 20.0000 mg | Freq: Once | INTRAVENOUS | Status: DC | PRN

## 2020-11-22 NOTE — Progress Notes (Signed)
I connected by phone with Geoffery Spruce Gant-Pegues on 11/22/2020 at 10:27 AM to discuss the potential use of a new treatment for mild to moderate COVID-19 viral infection in non-hospitalized patients.  This patient is a 45 y.o. female that meets the FDA criteria for Emergency Use Authorization of COVID monoclonal antibody casirivimab/imdevimab, bamlanivimab/etesevimab, or sotrovimab.  Has a (+) direct SARS-CoV-2 viral test result  Has mild or moderate COVID-19   Is NOT hospitalized due to COVID-19  Is within 10 days of symptom onset  Has at least one of the high risk factor(s) for progression to severe COVID-19 and/or hospitalization as defined in EUA.  Specific high risk criteria : BMI > 25, Diabetes and Cardiovascular disease or hypertension   I have spoken and communicated the following to the patient or parent/caregiver regarding COVID monoclonal antibody treatment:  1. FDA has authorized the emergency use for the treatment of mild to moderate COVID-19 in adults and pediatric patients with positive results of direct SARS-CoV-2 viral testing who are 5 years of age and older weighing at least 40 kg, and who are at high risk for progressing to severe COVID-19 and/or hospitalization.  2. The significant known and potential risks and benefits of COVID monoclonal antibody, and the extent to which such potential risks and benefits are unknown.  3. Information on available alternative treatments and the risks and benefits of those alternatives, including clinical trials.  4. Patients treated with COVID monoclonal antibody should continue to self-isolate and use infection control measures (e.g., wear mask, isolate, social distance, avoid sharing personal items, clean and disinfect "high touch" surfaces, and frequent handwashing) according to CDC guidelines.   5. The patient or parent/caregiver has the option to accept or refuse COVID monoclonal antibody treatment.  After reviewing this  information with the patient, the patient has agreed to receive one of the available covid 19 monoclonal antibodies and will be provided an appropriate fact sheet prior to infusion.  Murray Hodgkins, NP 11/22/2020 10:27 AM

## 2020-11-22 NOTE — Progress Notes (Signed)
Patient reviewed Fact Sheet for Patients, Parents, and Caregivers for Emergency Use Authorization (EUA) of sotrovimab for the Treatment of Coronavirus. Patient also reviewed and is agreeable to the estimated cost of treatment. Patient is agreeable to proceed.   

## 2020-11-22 NOTE — Progress Notes (Signed)
  Diagnosis: COVID-19  Physician: Dr Joya Gaskins  Procedure: Covid Infusion Clinic Med: casirivimab\imdevimab infusion - Provided patient with casirivimab\imdevimab fact sheet for patients, parents and caregivers prior to infusion.  Complications: No immediate complications noted.  Discharge: Discharged home   Ashley Murrain 11/22/2020

## 2020-11-22 NOTE — Discharge Instructions (Signed)

## 2020-11-28 NOTE — Progress Notes (Signed)
Name: Erin Good   MRN: [id number]    DOB: [birth year]   Date:11/29/2020       Progress Note  Subjective  Chief Complaint  COVID-19  I connected with  Erin Good  on 11/29/20 at  2:20 PM EST by a video enabled telemedicine application and verified that I am speaking with the correct person using two identifiers.  I discussed the limitations of evaluation and management by telemedicine and the availability of in person appointments. The patient expressed understanding and agreed to proceed with the virtual visit  Staff also discussed with the patient that there may be a patient responsible charge related to this service. Patient Location: at home  Provider Location: [facility] Additional Individuals present: alone   HPI  COVID-19: diagnosed on 11/19/2020. She went to Urgent Care on 11/19/2020 and was given Albuterol and tessalon perrles, she had video visit with Verline Lema, PA on 0117/2022 and was given Zpack and prednisone for bronchitis, she had  monoclonal antibody therapy on 11/22/2020. She only took two days off work , she continues to have lack of sense of taste and smell. She feels extremely tired. She also has noticed mental fogginess and difficulty concentrating. Explained if persists it is considered sequela from COVID-19. Discussed considering FMLA for no more than 6 hours at work, to rest more and stay hydrated. She has a neurologist and advised follow up with him also. Explained that a stimulant could be helpful but not a good choice for her since she has a history of palpitation and takes Atenolol  Patient Active Problem List   Diagnosis Date Noted  . Diabetes mellitus without complication ([place])   . Morbid obesity ([place])   . Hypertension   . COVID-19 virus infection 11/2020  . Impingement syndrome of left shoulder region 04/20/2020  . Vitamin D deficiency 01/31/2017  . Right shoulder tendinitis 01/31/2017  . Tendinosis 01/31/2017  . Bell's palsy 12/19/2016   . Tachycardia 09/30/2016  . Depression with anxiety 05/06/2015  . Allergic rhinitis 04/20/2015  . Anxiety and depression 04/20/2015  . 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 Gastro-esophageal reflux disease without esophagitis 04/20/2015  . Dysmetabolic syndrome 54/00/8676  . Extreme obesity 04/20/2015  . NASH (nonalcoholic steatohepatitis) 04/20/2015  . Allergy to nuts 04/20/2015  . Calculus of kidney 04/20/2015  . Type 2 diabetes mellitus with renal manifestations ([place]) 04/20/2015  . Central sleep apnea 11/26/2008  . Dyslipidemia 07/02/2008  . Leukocytosis 07/29/2007    Past Surgical History:  Procedure Laterality Date  . BREAST BIOPSY Left 2018   benign  . CESAREAN SECTION     X 2  . IRRIGATION AND DEBRIDEMENT SHOULDER Left 10/12/2020   Procedure: IRRIGATION AND DEBRIDEMENT SHOULDER;  Surgeon: Hiram Gash, MD;  Location: [facility];  Service: Orthopedics;  Laterality: Left;  . RIGHT OOPHORECTOMY Right 2001   benign tumor  . TUBAL LIGATION  2007    Family History  Problem Relation Age of Onset  . Diabetes Mother   . Breast cancer Paternal Grandmother 65  . Cancer Paternal Grandmother   . Cancer Paternal Aunt   . Cancer Maternal Grandmother   . Heart disease Neg Hx       Current Outpatient Medications:  .  albuterol (VENTOLIN HFA) 108 (90 Base) MCG/ACT inhaler, Inhale 2 puffs into the lungs every 6 (six) hours as needed for wheezing or shortness of breath., Disp: 8 g, Rfl: 2 .  benzonatate (TESSALON) 100 MG capsule, Take 1 capsule (  100 mg total) by mouth every 8 (eight) hours., Disp: 21 capsule, Rfl: 0 .  cetirizine (ZYRTEC ALLERGY) 10 MG tablet, Take 1 tablet (10 mg total) by mouth daily., Disp: 30 tablet, Rfl: 0 .  clonazePAM (KLONOPIN) 0.5 MG tablet, Take 0.5 mg by mouth daily as needed (Seizures). , Disp: , Rfl: 3 .  diclofenac (VOLTAREN) 75 MG EC tablet, Take 1 tablet (75 mg total) by mouth 2 (two) times daily., Disp: 60 tablet, Rfl: 0 .  EPINEPHrine 0.3  mg/0.3 mL IJ SOAJ injection, Inject 0.3 mg into the muscle as needed for anaphylaxis (for anaphylaxis). , Disp: , Rfl:  .  fluticasone (FLONASE) 50 MCG/ACT nasal spray, Place 1 spray into both nostrils daily., Disp: 16 g, Rfl: 0 .  gabapentin (NEURONTIN) 100 MG capsule, Take 100 mg by mouth 3 (three) times daily., Disp: , Rfl:  .  insulin glargine (LANTUS SOLOSTAR) 100 UNIT/ML Solostar Pen, Inject 60 Units into the skin at bedtime., Disp: 30 mL, Rfl: 3 .  Insulin Pen Needle 32G X 4 MM MISC, Use 1x a day, Disp: 100 each, Rfl: 3 .  metFORMIN (GLUCOPHAGE-XR) 500 MG 24 hr tablet, Take 4 tablets (2,000 mg total) by mouth daily with supper., Disp: 360 tablet, Rfl: 3 .  montelukast (SINGULAIR) 10 MG tablet, TAKE 1 TABLET BY MOUTH EVERYDAY AT BEDTIME (Patient taking differently: Take 10 mg by mouth at bedtime.), Disp: 90 tablet, Rfl: 0 .  OneTouch Delica Lancets 17P MISC, Use 2x a day with OneTouch Verio Flex, Disp: 200 each, Rfl: 3 .  ONETOUCH VERIO test strip, USE 2X A DAY WITH ONETOUCH VERIO FLEX, Disp: 50 strip, Rfl: 15 .  OZEMPIC, 1 MG/DOSE, 4 MG/3ML SOPN, Inject 1 mL into the skin once a week., Disp: , Rfl:  .  rosuvastatin (CRESTOR) 20 MG tablet, Take 1 tablet (20 mg total) by mouth daily., Disp: 90 tablet, Rfl: 3 .  Spacer/Aero-Holding Chambers (OPTICHAMBER DIAMOND-LG MASK) DEVI, , Disp: , Rfl:  .  tiZANidine (ZANAFLEX) 2 MG tablet, Take 2 mg by mouth at bedtime as needed for muscle spasms. , Disp: , Rfl:  .  Vitamin D, Ergocalciferol, (DRISDOL) 1.25 MG (50000 UNIT) CAPS capsule, Take 1 capsule (50,000 Units total) by mouth every Friday., Disp: 12 capsule, Rfl: 0 .  zonisamide (ZONEGRAN) 100 MG capsule, Take 400 mg by mouth at bedtime. , Disp: , Rfl:  .  atenolol (TENORMIN) 25 MG tablet, TAKE 1 TABLET BY MOUTH EVERY DAY (Patient not taking: No sig reported), Disp: 30 tablet, Rfl: 0 .  Continuous Blood Gluc Sensor (FREESTYLE LIBRE 2 SENSOR) MISC, 1 each by Does not apply route every 14 (fourteen)  days. (Patient not taking: No sig reported), Disp: 2 each, Rfl: 5  Allergies  Allergen Reactions  . Peanuts [Peanut Oil] Anaphylaxis  . Aspirin Other (See Comments)    Does not take because of her epilepsy/seizure     I personally reviewed active problem list, medication list, allergies, family history, social history, health maintenance with the patient/caregiver today.   ROS  Ten systems reviewed and is negative except as mentioned in HPI   Objective  Virtual encounter, vitals not obtained.  Body mass index is 49.49 kg/m.  Physical Exam  Awake, alert and oriented, no distress   PHQ2/9: Depression screen [facility] 2/9 11/29/2020 11/21/2020 08/15/2020 07/13/2020 06/30/2020  Decreased Interest 0 0 0 0 0  Down, Depressed, Hopeless 0 0 0 0 0  PHQ - 2 Score 0 0 0 0 0  Altered sleeping 0 0 - - 0  Tired, decreased energy 3 0 - - 0  Change in appetite 3 0 - - 0  Feeling bad or failure about yourself  0 0 - - 0  Trouble concentrating 0 0 - - 0  Moving slowly or fidgety/restless 0 0 - - 0  Suicidal thoughts 0 0 - - 0  PHQ-9 Score 6 0 - - 0  Difficult doing work/chores Not difficult at all Not difficult at all - - -  Some recent data might be hidden   PHQ-2/9 Result is negative - due to COVID  Fall Risk: Fall Risk  11/29/2020 11/21/2020 08/15/2020 07/13/2020 06/30/2020  Falls in the past year? 0 0 1 1 1   Number falls in past yr: 0 0 1 1 0  Injury with Fall? 0 0 1 1 1   Comment - - - - -  Risk for fall due to : - - History of fall(s) - -  Follow up - - - Falls evaluation completed -     Assessment & Plan  1. COVID-19 virus infection   2. Persistent fatigue after COVID-19  Discussed long haul symptoms, importance of considering FMLA for work, but she wants to power through, only took two days off  3. Difficulty concentrating  Discussed Provigil but since she has tachycardia too dangerous, advised follow up with neurologist   I discussed the assessment and treatment plan with  the patient. The patient was provided an opportunity to ask questions and all were answered. The patient agreed with the plan and demonstrated an understanding of the instructions.  The patient was advised to call back or seek an in-person evaluation if the symptoms worsen or if the condition fails to improve as anticipated.  I provided 15 minutes of non-face-to-face time during this encounter.

## 2020-11-29 ENCOUNTER — Encounter: Payer: Self-pay | Admitting: Family Medicine

## 2020-11-29 ENCOUNTER — Telehealth (INDEPENDENT_AMBULATORY_CARE_PROVIDER_SITE_OTHER): Payer: 59 | Admitting: Family Medicine

## 2020-11-29 VITALS — Ht 67.0 in | Wt 316.0 lb

## 2020-11-29 DIAGNOSIS — R5383 Other fatigue: Secondary | ICD-10-CM

## 2020-11-29 DIAGNOSIS — U099 Post covid-19 condition, unspecified: Secondary | ICD-10-CM

## 2020-11-29 DIAGNOSIS — B948 Sequelae of other specified infectious and parasitic diseases: Secondary | ICD-10-CM | POA: Diagnosis not present

## 2020-11-29 DIAGNOSIS — U071 COVID-19: Secondary | ICD-10-CM | POA: Diagnosis not present

## 2020-11-29 DIAGNOSIS — R41840 Attention and concentration deficit: Secondary | ICD-10-CM | POA: Diagnosis not present

## 2020-12-08 LAB — HM DIABETES EYE EXAM

## 2021-01-04 ENCOUNTER — Other Ambulatory Visit: Payer: Self-pay | Admitting: Family Medicine

## 2021-01-04 DIAGNOSIS — N941 Unspecified dyspareunia: Secondary | ICD-10-CM

## 2021-01-04 DIAGNOSIS — N920 Excessive and frequent menstruation with regular cycle: Secondary | ICD-10-CM

## 2021-02-20 ENCOUNTER — Other Ambulatory Visit: Payer: Self-pay | Admitting: Internal Medicine

## 2021-03-02 ENCOUNTER — Ambulatory Visit: Payer: 59 | Admitting: Internal Medicine

## 2021-03-02 NOTE — Progress Notes (Deleted)
Patient ID: Erin Good, female   DOB: 06/29/1976, 45 y.o.   MRN: [id number]   This visit occurred during the SARS-CoV-2 public health emergency.  Safety protocols were in place, including screening questions prior to the visit, additional usage of staff PPE, and extensive cleaning of exam room while observing appropriate contact time as indicated for disinfecting solutions.   HPI: Erin Good is a 45 y.o.-year-old female, initially referred by her PCP, Dr. Ancil Boozer, presenting for follow-up for DM2, dx in ~2015, insulin-dependent since 2019, uncontrolled, with microalbuminuria and also for thyroid nodules.  Last visit 4 months ago.  Interim history: Since last visit, patient developed COVID-19 in 11/2020.  She got the antibody infusion.  She has persistent fatigue.   Reviewed HbA1c levels: Lab Results  Component Value Date   HGBA1C 7.6 (H) 10/03/2020   HGBA1C 7.9 (A) 08/15/2020   HGBA1C 9.0 (H) 06/30/2020   HGBA1C 8.3 (A) 05/30/2020   HGBA1C 8.0 (A) 11/12/2019   HGBA1C 10.9 (H) 05/25/2019   HGBA1C 10.0 (A) 01/29/2019   HGBA1C 10.4 (A) 04/01/2018   HGBA1C 9.8 12/18/2017   HGBA1C 9.4 09/17/2017   Pt was on a regimen of: - Metformin ER 750 mg 2x a day, with meals - Soliqua (Lantus + Lixisenatide) 50 units daily in am  Currently on: - Metformin ER 750 mg 2x a day with meals >> 1500 >> 2000 mg with dinner - Lantus 50 >> 56 units at bedtime - Ozempic 1 mg weekly Tried Farxiga 06/2020 >> not covered.  She checks her sugars 1-2 times a day-no log, no meter: - am: 146-200, 280 >> 220-330, 393 >> 140-160, 170 - 2h after b'fast: n/c >> 85-130 - before lunch: n/c >> 92-180s >> n/c - 2h after lunch: 170-180 >> 98-118 >> n/c >> 140-180 >> n/c - before dinner: n/c >> 120-150 >> 150-201 >> 140s - 2h after dinner: n/c - bedtime: n/c >> 150s >> n/c - nighttime: n/c Lowest sugar was98 >> 92 >> 80; she has hypoglycemia awareness at 100. Highest sugar was 393 >>  170  Glucometer: [employer] advance  Pt's meals are: - Breakfast: 2 eggs + 3 pieces of bacon >> 2 eggs - snack: fruit - Lunch: salad - Dinner:meat + veggie + starch (rice + pasta) - Snacks: 1  Reducing sodas: Coke >> sparkling water In the past she was drinking juice and I strongly advised her to stop.  -No CKD, last BUN/creatinine:  Lab Results  Component Value Date   BUN 10 10/03/2020   BUN 6 07/13/2020   CREATININE 0.61 10/03/2020   CREATININE 0.67 07/13/2020  Not on ACE inhibitor/ARB.  ACR levels are high: Lab Results  Component Value Date   MICRALBCREAT 3.9 10/27/2020   MICRALBCREAT 33 (H) 11/12/2019   MICRALBCREAT 30 (H) 01/29/2019   -+ HL; last set of lipids: Lab Results  Component Value Date   CHOL 217 (H) 10/27/2020   HDL 40.50 10/27/2020   LDLCALC 149 (H) 10/27/2020   TRIG 139.0 10/27/2020   CHOLHDL 5 10/27/2020  She was started on Crestor 10 by PCP and we increase this to 20 mg daily in 10/2020.  - last eye exam was in 12/2020: + DR  - no numbness and tingling in her feet.  Pt has FH of DM in mother, MGF.  Thyroid nodules: -At last visit I ordered another thyroid ultrasound but she did not have this yet  Reviewed previous work-up: 06/01/2019: Thyroid ultrasound: Parenchymal Echotexture: Mildly heterogenous Isthmus: 0.4  cm thickness Right lobe: 5 x 2.4 x 2.8 cm Left lobe: 4.5 x 2 x 2.1 cm  Estimated total number of nodules >/= 1 cm: 4  Nodule # 1: Location: Right; Mid Maximum size: 1.6 cm; Other 2 dimensions: 1.5 x 1.4 cm Composition: mixed cystic and solid (1) Echogenicity: hypoechoic (2) *Given size (>/= 1.5 - 2.4 cm) and appearance, a follow-up ultrasound in 1 year should be considered based on TI-RADS criteria. _________________________________________________________  Nodule # 2: Location: Right; Inferior Maximum size: 2.3 cm; Other 2 dimensions: 1.9 x 1.9 cm Composition: solid/almost completely solid (2) Echogenicity: hypoechoic  (2) **Given size (>/= 1.5 cm) and appearance, fine needle aspiration of this moderately suspicious nodule should be considered based on TI-RADS criteria. _________________________________________________  Nodule # 3: Location: Left; Inferior Maximum size: 1.4 cm; Other 2 dimensions: 1.2 x 1.2 cm Composition: solid/almost completely solid (2) Echogenicity: isoechoic (1) Given size (<1.4 cm) and appearance, this nodule does NOT meet TI-RADS criteria for biopsy or dedicated follow-up. _________________________________________________________  Nodule # 4: Location: Left; Mid Maximum size: 1.8 cm; Other 2 dimensions: 1.6 x 1.3 cm Composition: solid/almost completely solid (2) Echogenicity: hypoechoic (2) **Given size (>/= 1.5 cm) and appearance, fine needle aspiration of this moderately suspicious nodule should be considered based on TI-RADS criteria.  IMPRESSION: 1. Thyromegaly with bilateral nodules. 2. Recommend FNA biopsy of moderately suspicious 1.8 cm mid left AND 2.3 cm inferior right nodules. 3. Recommend annual/biennial ultrasound follow-up of additional nodules as above, until stability x5 years confirmed.  08/05/2019: FNA of the 2 nodules: Clinical History: Right inferior 2.3cm; Other 2 dimensions: 1.9 x 1.9cm,  Solid / almost completely solid, Hypoechoic, TI-RADS total points 4  Specimen Submitted: A. THYROID, RLP, FINE NEEDLE ASPIRATION:  DIAGNOSIS:  - Consistent with benign follicular nodule (Bethesda category II)  SPECIMEN ADEQUACY:  Satisfactory for evaluation   Clinical History: Left mid 1.8cm; Other 2 dimensions: 1.6 x 1.3cm, Solid  / almost completely solid, Hypoechoic, TI-RADS total points 4  Specimen Submitted: A. THYROID, LMP, FINE NEEDLE ASPIRATION:  DIAGNOSIS:  - Consistent with benign follicular nodule (Bethesda category II)  SPECIMEN ADEQUACY:  Satisfactory for evaluation  Reviewed her TFTs: Lab Results  Component Value Date   TSH 1.74  10/27/2020   TSH 1.35 05/25/2019   TSH 1.070 07/28/2015   TSH 1.13 10/15/2014   Pt denies: - feeling nodules in neck - hoarseness - dysphagia - choking - SOB with lying down  She also has a history of PCOS and distant history of epilepsy at 45 years old. She has a history of frozen L shoulder >> was on Prednisone x 1 mo in summer 2021.  She had shoulder surgery 10/12/2020.  ROS: Constitutional: no weight gain/no weight loss, no fatigue, no subjective hyperthermia, no subjective hypothermia Eyes: no blurry vision, no xerophthalmia ENT: no sore throat, + see HPI Cardiovascular: no CP/no SOB/no palpitations/no leg swelling Respiratory: no cough/no SOB/no wheezing Gastrointestinal: no N/no V/no D/no C/no acid reflux Musculoskeletal: no muscle aches/+ joint aches Skin: no rashes, no hair loss Neurological: no tremors/no numbness/no tingling/no dizziness  I reviewed pt's medications, allergies, PMH, social hx, family hx, and changes were documented in the history of present illness. Otherwise, unchanged from my initial visit note.  Past Medical History:  Diagnosis Date  . Allergic rhinitis   . Breast discharge 06/05/2017   2 weeks ago left  . Breast mass 12/06/2016   left  . COVID-19 virus infection 11/2020  . Diabetes mellitus without complication ([place])   .  Dyslipidemia   . Epilepsy ([place])   . Febrile seizures ([place])   . Galactorrhea   . Hx gestational diabetes   . Hypertension   . Kidney stones   . Morbid obesity ([place])   . Obesity   . Seizures ([place])   . Sleep apnea   . Syncope and collapse   . Tachycardia    Past Surgical History:  Procedure Laterality Date  . BREAST BIOPSY Left 2018   benign  . CESAREAN SECTION     X 2  . IRRIGATION AND DEBRIDEMENT SHOULDER Left 10/12/2020   Procedure: IRRIGATION AND DEBRIDEMENT SHOULDER;  Surgeon: Hiram Gash, MD;  Location: [facility];  Service: Orthopedics;  Laterality: Left;  . RIGHT OOPHORECTOMY Right 2001   benign tumor  .  TUBAL LIGATION  2007   Social History   Socioeconomic History  . Marital status: Married    Spouse name: Roderic Palau  . Number of children: 2  . Years of education: College  . Highest education level: Not on file  Occupational History  . Occupation: Optometrist  Tobacco Use  . Smoking status: Never Smoker  . Smokeless tobacco: Never Used  Vaping Use  . Vaping Use: Never used  Substance and Sexual Activity  . Alcohol use: Yes    Comment: occass  . Drug use: No  . Sexual activity: Yes    Partners: Male    Birth control/protection: Surgical    Comment: tubial lig  Other Topics Concern  . Not on file  Social History Narrative  . Not on file   Social Determinants of Health   Financial Resource Strain: Not on file  Food Insecurity: Not on file  Transportation Needs: Not on file  Physical Activity: Not on file  Stress: Not on file  Social Connections: Not on file  Intimate Partner Violence: Not on file   Current Outpatient Medications on File Prior to Visit  Medication Sig Dispense Refill  . albuterol (VENTOLIN HFA) 108 (90 Base) MCG/ACT inhaler Inhale 2 puffs into the lungs every 6 (six) hours as needed for wheezing or shortness of breath. 8 g 2  . atenolol (TENORMIN) 25 MG tablet TAKE 1 TABLET BY MOUTH EVERY DAY (Patient not taking: No sig reported) 30 tablet 0  . benzonatate (TESSALON) 100 MG capsule Take 1 capsule (100 mg total) by mouth every 8 (eight) hours. 21 capsule 0  . cetirizine (ZYRTEC ALLERGY) 10 MG tablet Take 1 tablet (10 mg total) by mouth daily. 30 tablet 0  . clonazePAM (KLONOPIN) 0.5 MG tablet Take 0.5 mg by mouth daily as needed (Seizures).   3  . Continuous Blood Gluc Sensor (FREESTYLE LIBRE 2 SENSOR) MISC 1 each by Does not apply route every 14 (fourteen) days. (Patient not taking: No sig reported) 2 each 5  . diclofenac (VOLTAREN) 75 MG EC tablet Take 1 tablet (75 mg total) by mouth 2 (two) times daily. 60 tablet 0  . EPINEPHrine 0.3 mg/0.3 mL IJ SOAJ  injection Inject 0.3 mg into the muscle as needed for anaphylaxis (for anaphylaxis).     . fluticasone (FLONASE) 50 MCG/ACT nasal spray Place 1 spray into both nostrils daily. 16 g 0  . gabapentin (NEURONTIN) 100 MG capsule Take 100 mg by mouth 3 (three) times daily.    . Insulin Pen Needle 32G X 4 MM MISC Use 1x a day 100 each 3  . LANTUS SOLOSTAR 100 UNIT/ML Solostar Pen INJECT 50 UNITS INTO THE SKIN DAILY 15 mL 2  .  metFORMIN (GLUCOPHAGE-XR) 500 MG 24 hr tablet Take 4 tablets (2,000 mg total) by mouth daily with supper. 360 tablet 3  . montelukast (SINGULAIR) 10 MG tablet TAKE 1 TABLET BY MOUTH EVERYDAY AT BEDTIME (Patient taking differently: Take 10 mg by mouth at bedtime.) 90 tablet 0  . OneTouch Delica Lancets 57Q MISC Use 2x a day with OneTouch Verio Flex 200 each 3  . ONETOUCH VERIO test strip USE 2X A DAY WITH ONETOUCH VERIO FLEX 50 strip 15  . OZEMPIC, 1 MG/DOSE, 4 MG/3ML SOPN Inject 1 mL into the skin once a week.    . rosuvastatin (CRESTOR) 20 MG tablet Take 1 tablet (20 mg total) by mouth daily. 90 tablet 3  . Spacer/Aero-Holding Chambers ([place]) DEVI     . tiZANidine (ZANAFLEX) 2 MG tablet Take 2 mg by mouth at bedtime as needed for muscle spasms.     . Vitamin D, Ergocalciferol, (DRISDOL) 1.25 MG (50000 UNIT) CAPS capsule Take 1 capsule (50,000 Units total) by mouth every Friday. 12 capsule 0  . zonisamide (ZONEGRAN) 100 MG capsule Take 400 mg by mouth at bedtime.      No current facility-administered medications on file prior to visit.   Allergies  Allergen Reactions  . Peanuts [Peanut Oil] Anaphylaxis  . Aspirin Other (See Comments)    Does not take because of her epilepsy/seizure    Family History  Problem Relation Age of Onset  . Diabetes Mother   . Breast cancer Paternal Grandmother 10  . Cancer Paternal Grandmother   . Cancer Paternal Aunt   . Cancer Maternal Grandmother   . Heart disease Neg Hx    PE: There were no vitals taken for this  visit. Wt Readings from Last 3 Encounters:  11/29/20 (!) 316 lb (143.3 kg)  10/27/20 (!) 316 lb 12.8 oz (143.7 kg)  10/12/20 (!) 312 lb (141.5 kg)   Constitutional: overweight, in NAD Eyes: PERRLA, EOMI, no exophthalmos ENT: moist mucous membranes, no thyromegaly, no cervical lymphadenopathy Cardiovascular: RRR, No MRG Respiratory: CTA B Gastrointestinal: abdomen soft, NT, ND, BS+ Musculoskeletal: no deformities, strength intact in all 4 Skin: moist, warm, no rashes Neurological: no tremor with outstretched hands, DTR normal in all 4  ASSESSMENT: 1. DM2, insulin-dependent, uncontrolled, with complications - MAU  2.  Thyroid nodules  3. HL  4. Obesity class 3  PLAN:  1. Patient with longstanding, uncontrolled, type 2 diabetes, on oral antidiabetic regimen with metformin and also daily long-acting insulin and weekly GLP-1 receptor agonist, with improved control after taking Lantus at night.  At last visit, sugars in the morning were better but they were still above target so we increased the dose of metformin to 2000 mg daily, with dinner.  I advised her that if sugars did not improve after this change, to increase the Lantus dose further.  HbA1c at last visit was 7.6%, decreased from 7.9%.  - I suggested to:  Patient Instructions  Please continue: - Metformin ER 2000 mg with dinner - Ozempic 1 mg weekly - Lantus 56 units at bedtime  We will need to schedule another thyroid ultrasound. I will order this today.  Please return in 3-4 months with your sugar log.   - we checked her HbA1c: 7%  - advised to check sugars at different times of the day - 1x a day, rotating check times - advised for yearly eye exams >> she is UTD - return to clinic in 3-4 months  2.  Thyroid nodules -  She denies neck compression symptoms -Latest TSH was reviewed and this was normal: Lab Results  Component Value Date   TSH 1.74 10/27/2020  -Reviewed the results of her most recent thyroid  ultrasound from 05/2019 and the 2 thyroid nodule biopsies from 07/2019.  These were benign. -At last visit, I ordered another thyroid ultrasound but she did not have this yet.  I will reorder this today.  3. HL -Reviewed latest lipid panel from last visit: LDL high, the rest of the fractions at goal: Lab Results  Component Value Date   CHOL 217 (H) 10/27/2020   HDL 40.50 10/27/2020   LDLCALC 149 (H) 10/27/2020   TRIG 139.0 10/27/2020   CHOLHDL 5 10/27/2020  -At last visit she was on Crestor 10 mg daily and will increase to 20 mg daily.  No side effects.  4.  Obesity class III -We will continue Ozempic which should also help with weight loss -She lost 6 pounds before last visit  Philemon Kingdom, MD PhD [facility] Endocrinology

## 2021-03-08 ENCOUNTER — Other Ambulatory Visit: Payer: Self-pay | Admitting: Family Medicine

## 2021-03-08 DIAGNOSIS — B379 Candidiasis, unspecified: Secondary | ICD-10-CM

## 2021-03-14 ENCOUNTER — Telehealth: Payer: 59 | Admitting: Physician Assistant

## 2021-03-14 DIAGNOSIS — R102 Pelvic and perineal pain: Secondary | ICD-10-CM

## 2021-03-14 NOTE — Progress Notes (Signed)
Based on what you shared with me, I feel your condition warrants further evaluation and I recommend that you be seen in a face to face office visit. It seems like there may be more than one thing present currently -- both a UTI and a vaginal infection. Giving vaginal pain and fever along with other symptoms, I recommend you be seen in person so you can get an examination, urine testing and testing of the vaginal canal as well so that you get treated appropriately. If not treated appropriately symptoms can worsen and cause more significant complications.   NOTE: If you entered your credit card information for this eVisit, you will not be charged. You may see a "hold" on your card for the $35 but that hold will drop off and you will not have a charge processed.   If you are having a true medical emergency please call 911.      For an urgent face to face visit, [place] has six urgent care centers for your convenience:     [facility] [place] [facility] [place] Get Driving Directions [phone] [address] [place], [place] [address] . 8 am - 4 pm Monday - Friday    [facility]) Get Driving Directions [phone] [address] [place], [place] [address] . 8 am to 8 pm Monday-Friday . 10 am to 6 pm [facility] Get Driving Directions [phone]  [address] [place],  [place]  [address] . 8 am to 8 pm Monday-Friday . 8 am to 4 pm [facility]  Get Driving Directions [phone] [place], [place], [place] [address] . 8 am to 8 pm Monday-Friday . 8 am to 4 pm [facility] [place] Get Driving Directions  [phone] [address] [place], [place] [address] . 8 am to 8 pm Monday-Friday . 8 am to 4 pm [facility] Urgent [facility] at [place] Get Driving Directions [phone] [address] [place], [place] [address] . 8 am to 8 pm Monday-Friday . 8 am to 4 pm Saturday-Sunday     Your MyChart E-visit questionnaire answers were reviewed by a board certified advanced clinical practitioner to complete your personal care plan based on your specific symptoms.  Thank you for using e-Visits.

## 2021-05-10 ENCOUNTER — Other Ambulatory Visit: Payer: Self-pay | Admitting: Internal Medicine

## 2021-05-22 ENCOUNTER — Other Ambulatory Visit: Payer: Self-pay | Admitting: Internal Medicine

## 2021-05-23 ENCOUNTER — Telehealth: Payer: 59 | Admitting: Physician Assistant

## 2021-05-23 DIAGNOSIS — B373 Candidiasis of vulva and vagina: Secondary | ICD-10-CM | POA: Diagnosis not present

## 2021-05-23 DIAGNOSIS — B3731 Acute candidiasis of vulva and vagina: Secondary | ICD-10-CM

## 2021-05-23 MED ORDER — FLUCONAZOLE 150 MG PO TABS: 150.0000 mg | ORAL_TABLET | Freq: Once | ORAL | 0 refills | Status: AC

## 2021-05-23 NOTE — Progress Notes (Signed)
Virtual Visit Consent   CURTIS URIARTE, you are scheduled for a virtual visit with a [facility] provider today.     Just as with appointments in the office, your consent must be obtained to participate.  Your consent will be active for this visit and any virtual visit you may have with one of our providers in the next 365 days.     If you have a MyChart account, a copy of this consent can be sent to you electronically.  All virtual visits are billed to your insurance company just like a traditional visit in the office.    As this is a virtual visit, video technology does not allow for your provider to perform a traditional examination.  This may limit your provider's ability to fully assess your condition.  If your provider identifies any concerns that need to be evaluated in person or the need to arrange testing (such as labs, EKG, etc.), we will make arrangements to do so.     Although advances in technology are sophisticated, we cannot ensure that it will always work on either your end or our end.  If the connection with a video visit is poor, the visit may have to be switched to a telephone visit.  With either a video or telephone visit, we are not always able to ensure that we have a secure connection.     I need to obtain your verbal consent now.   Are you willing to proceed with your visit today?    Deziray Nabi Gant-Pegues has provided verbal consent on 05/23/2021 for a virtual visit (video or telephone).   Leeanne Rio, [place]   Date: 05/23/2021 12:05 PM   Virtual Visit via Video Note   I, Leeanne Rio, connected with  KARYSA HEFT  ([id number], December 11, 1975) on 05/23/21 at 12:00 PM EDT by a video-enabled telemedicine application and verified that I am speaking with the correct person using two identifiers.  Location: Patient: Virtual Visit Location Patient: Home Provider: Virtual Visit Location Provider: Home Office   I discussed the limitations of  evaluation and management by telemedicine and the availability of in person appointments. The patient expressed understanding and agreed to proceed.    History of Present Illness: KOLLEEN OCHSNER is a 45 y.o. who identifies as a female who was assigned female at birth, and is being seen today for possible yeast infection. Endorses symptoms starting Sunday with vaginal discharge and pruritus and gradually worsening. Denies fever, chills, LMP ended 04/30/2021.  S/p tubal ligation.  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   Gastro-esophageal reflux disease without esophagitis 01/60/1093   Dysmetabolic syndrome 23/55/7322   Extreme obesity 04/20/2015   NASH (nonalcoholic steatohepatitis) 04/20/2015   Allergy to nuts 04/20/2015   Calculus of kidney 04/20/2015   Type 2 diabetes mellitus with renal manifestations ([place]) 04/20/2015   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 May repeat in 3 days if needed, Disp: 2 tablet, Rfl: 0   albuterol (VENTOLIN HFA) 108 (90 Base) MCG/ACT inhaler, Inhale 2 puffs into the lungs every 6 (six) hours  as needed for wheezing or shortness of breath., Disp: 8 g, Rfl: 2   atenolol (TENORMIN) 25 MG tablet, TAKE 1 TABLET BY MOUTH EVERY DAY (Patient not taking: No sig reported), Disp: 30 tablet, Rfl: 0   benzonatate (TESSALON) 100 MG capsule, Take 1 capsule (100 mg total) by mouth every 8 (eight) hours., Disp: 21 capsule, Rfl: 0   cetirizine (ZYRTEC ALLERGY) 10 MG tablet, Take 1 tablet (10 mg total) by mouth daily., Disp: 30 tablet, Rfl: 0   clonazePAM (KLONOPIN) 0.5 MG tablet, Take 0.5 mg by mouth daily as needed (Seizures). , Disp: , Rfl: 3   Continuous Blood Gluc Sensor (FREESTYLE LIBRE 2 SENSOR) MISC, 1 each by Does not apply route every 14 (fourteen) days. (Patient not taking: No sig reported), Disp: 2 each, Rfl: 5   diclofenac (VOLTAREN) 75 MG EC tablet, Take 1 tablet (75 mg total) by mouth 2 (two) times daily., Disp: 60 tablet, Rfl: 0   EPINEPHrine 0.3 mg/0.3 mL IJ SOAJ injection, Inject 0.3 mg into the muscle as needed for anaphylaxis (for anaphylaxis). , Disp: , Rfl:    fluticasone (FLONASE) 50 MCG/ACT nasal spray, Place 1 spray into both nostrils daily., Disp: 16 g, Rfl: 0   gabapentin (NEURONTIN) 100 MG capsule, Take 100 mg by mouth 3 (three) times daily., Disp: , Rfl:    Insulin Pen Needle 32G X 4 MM MISC, Use 1x a day, Disp: 100 each, Rfl: 3   LANTUS SOLOSTAR 100 UNIT/ML Solostar Pen, INJECT 50 UNITS INTO THE SKIN DAILY, Disp: 15 mL, Rfl: 2   metFORMIN (GLUCOPHAGE-XR) 500 MG 24 hr tablet, Take 4 tablets (2,000 mg total) by mouth daily with supper., Disp: 360 tablet, Rfl: 3   montelukast (SINGULAIR) 10 MG tablet, TAKE 1 TABLET BY MOUTH EVERYDAY AT BEDTIME (Patient taking differently: Take 10 mg by mouth at bedtime.), Disp: 90 tablet, Rfl: 0   OneTouch Delica Lancets 51Z MISC, Use 2x a day with OneTouch Verio Flex, Disp: 200 each, Rfl: 3   ONETOUCH VERIO test strip, USE 2X A DAY WITH ONETOUCH VERIO FLEX, Disp: 50 strip, Rfl: 15   OZEMPIC, 1 MG/DOSE, 4 MG/3ML SOPN, INJECT 1MG INTO THE SKIN ONCE  PER WEEK, Disp: 3 mL, Rfl: 0   rosuvastatin (CRESTOR) 20 MG tablet, Take 1 tablet (20 mg total) by mouth daily., Disp: 90 tablet, Rfl: 3   Spacer/Aero-Holding Chambers (OPTICHAMBER DIAMOND-LG MASK) DEVI, , Disp: , Rfl:    tiZANidine (ZANAFLEX) 2 MG tablet, Take 2 mg by mouth at bedtime as needed for muscle spasms. , Disp: , Rfl:    Vitamin D, Ergocalciferol, (DRISDOL) 1.25 MG (50000 UNIT) CAPS capsule, Take 1 capsule (50,000 Units total) by mouth every Friday., Disp: 12 capsule, Rfl: 0   zonisamide (ZONEGRAN) 100 MG capsule, Take 400 mg by mouth at bedtime. , Disp: , Rfl:   Observations/Objective: Patient is well-developed, well-nourished in no acute distress.  Resting comfortably at home.  Head is normocephalic, atraumatic.  No labored breathing. Speech  is clear and coherent with logical content.  Patient is alert and oriented at baseline.   Assessment and Plan: 1. Yeast vaginitis - fluconazole (DIFLUCAN) 150 MG tablet; Take 1 tablet (150 mg total) by mouth once for 1 dose. May repeat in 3 days if needed  Dispense: 2 tablet; Refill: 0 Classic symptoms. No alarm signs/symptoms present. Feel reasonable to treat empirically for yeast vaginitis with Diflucan. Rx sent to pharmacy. Supportive measures reviewed. Will need in office evaluation for any non-resolving or recurring symptoms.  Follow Up Instructions: I discussed the assessment and treatment plan with the patient. The patient was provided an opportunity to ask questions and all were answered. The patient agreed with the plan and demonstrated an understanding of the instructions.  A copy of instructions were sent to the patient via MyChart.  The patient was advised to call back or seek an in-person evaluation if the symptoms worsen or if the condition fails to improve as anticipated.  Time:  I spent 10 minutes with the patient via telehealth technology discussing the above problems/concerns.    Leeanne Rio, PA-C

## 2021-06-25 ENCOUNTER — Other Ambulatory Visit: Payer: Self-pay | Admitting: Internal Medicine

## 2021-08-05 ENCOUNTER — Other Ambulatory Visit: Payer: Self-pay | Admitting: Internal Medicine

## 2021-08-06 ENCOUNTER — Other Ambulatory Visit: Payer: Self-pay

## 2021-08-06 ENCOUNTER — Emergency Department (INDEPENDENT_AMBULATORY_CARE_PROVIDER_SITE_OTHER)
Admission: RE | Admit: 2021-08-06 | Discharge: 2021-08-06 | Disposition: A | Discharge status: Home / Self Care | Payer: 59 | Source: Ambulatory Visit | Attending: Family Medicine | Admitting: Family Medicine

## 2021-08-06 VITALS — BP 137/85 | HR 108 | Temp 98.4°F | Resp 16 | Ht 67.0 in | Wt 315.0 lb

## 2021-08-06 DIAGNOSIS — B9689 Other specified bacterial agents as the cause of diseases classified elsewhere: Secondary | ICD-10-CM | POA: Diagnosis not present

## 2021-08-06 DIAGNOSIS — J019 Acute sinusitis, unspecified: Secondary | ICD-10-CM | POA: Diagnosis not present

## 2021-08-06 MED ORDER — AMOXICILLIN 875 MG PO TABS: 875.0000 mg | ORAL_TABLET | Freq: Two times a day (BID) | ORAL | 0 refills | Status: DC

## 2021-08-06 NOTE — ED Provider Notes (Signed)
[facility]    CSN: [id number] Arrival date & time: 08/06/21  1153      History   Chief Complaint Chief Complaint  Patient presents with   Cough    HPI Erin Good is a 45 y.o. female.   HPI  Patient has had sinus symptoms for a week.  Congestion.  Postnasal drip.  Sore throat.  Headache.  Slight cough.  Her daughter is ill as well.  Brought at home from school.  COVID testing at home was negative.  Past Medical History:  Diagnosis Date   Allergic rhinitis    Breast discharge 06/05/2017   2 weeks ago left   Breast mass 12/06/2016   left   COVID-19 virus infection 11/2020   Diabetes mellitus without complication ([place])    Dyslipidemia    Epilepsy ([place])    Febrile seizures ([place])    Galactorrhea    Hx gestational diabetes    Hypertension    Kidney stones    Morbid obesity ([place])    Obesity    Seizures ([place])    Sleep apnea    Syncope and collapse    Tachycardia     Patient Active Problem List   Diagnosis Date Noted   Diabetes mellitus without complication ([place])    Morbid obesity ([place])    Hypertension    COVID-19 virus infection 11/2020   Impingement syndrome of left shoulder region 04/20/2020   Vitamin D deficiency 01/31/2017   Right shoulder tendinitis 01/31/2017   Tendinosis 01/31/2017   Bell's palsy 12/19/2016   Tachycardia 09/30/2016   Depression with anxiety 05/06/2015   Allergic rhinitis 04/20/2015   Anxiety and depression 04/20/2015   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   Gastro-esophageal reflux disease without esophagitis 82/99/3716   Dysmetabolic syndrome 96/78/9381   Extreme obesity 04/20/2015   NASH (nonalcoholic steatohepatitis) 04/20/2015   Allergy to nuts 04/20/2015   Calculus of kidney 04/20/2015   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   Central sleep apnea 11/26/2008   Dyslipidemia 07/02/2008   Leukocytosis 07/29/2007    Past Surgical History:  Procedure Laterality Date    BREAST BIOPSY Left 2018   benign   CESAREAN SECTION     X 2   IRRIGATION AND DEBRIDEMENT SHOULDER Left 10/12/2020   Procedure: IRRIGATION AND DEBRIDEMENT SHOULDER;  Surgeon: Hiram Gash, MD;  Location: [facility];  Service: Orthopedics;  Laterality: Left;   RIGHT OOPHORECTOMY Right 2001   benign tumor   TUBAL LIGATION  2007    OB History     Gravida  2   Para  2   Term      Preterm  2   AB      Living  2      SAB      IAB      Ectopic      Multiple      Live Births  2        Obstetric Comments  Menstrual age: 31  Age 1st Pregnancy: 63            Home Medications    Prior to Admission medications   Medication Sig Start Date End Date Taking? Authorizing Provider  amoxicillin (AMOXIL) 875 MG tablet Take 1 tablet (875 mg total) by mouth 2 (two) times daily for 10 days. 08/06/21 08/16/21 Yes Raylene Everts, MD  clonazePAM (KLONOPIN) 0.5 MG tablet Take 0.5 mg by mouth daily as needed (Seizures).  08/14/16  [provider]  diclofenac (VOLTAREN) 75 MG EC tablet Take 1 tablet (75 mg total) by mouth 2 (two) times daily. 10/12/20   McBane, Maylene Roes, PA-C  EPINEPHrine 0.3 mg/0.3 mL IJ SOAJ injection Inject 0.3 mg into the muscle as needed for anaphylaxis (for anaphylaxis).  06/14/14   [provider]  fluticasone (FLONASE) 50 MCG/ACT nasal spray Place 1 spray into both nostrils daily. 11/19/20   Hall-Potvin, [place], PA-C  Insulin Pen Needle 32G X 4 MM MISC Use 1x a day 07/03/19   Philemon Kingdom, MD  LANTUS SOLOSTAR 100 UNIT/ML Solostar Pen INJECT 50 UNITS INTO THE SKIN DAILY 02/20/21   Philemon Kingdom, MD  metFORMIN (GLUCOPHAGE-XR) 500 MG 24 hr tablet Take 4 tablets (2,000 mg total) by mouth daily with supper. 10/27/20   Philemon Kingdom, MD  methocarbamol (ROBAXIN) 500 MG tablet Take 500 mg by mouth every 8 (eight) hours as needed. 07/20/21   [provider]  OneTouch Delica Lancets 63K MISC Use 2x a day with OneTouch Verio Flex  07/03/19   Philemon Kingdom, MD  [facility] VERIO test strip USE 2X A DAY WITH ONETOUCH VERIO FLEX 08/01/20   Philemon Kingdom, MD  OZEMPIC, 1 MG/DOSE, 4 MG/3ML SOPN INJECT 1MG INTO THE SKIN ONCE PER WEEK 06/26/21   Philemon Kingdom, MD  rosuvastatin (CRESTOR) 20 MG tablet Take 1 tablet (20 mg total) by mouth daily. 10/31/20   Philemon Kingdom, MD  Spacer/Aero-Holding Chambers ([place] DIAMOND-LG MASK) [facility]  11/19/20   [provider]  tiZANidine (ZANAFLEX) 2 MG tablet Take 2 mg by mouth at bedtime as needed for muscle spasms.  06/07/20   [provider]  Vitamin D, Ergocalciferol, (DRISDOL) 1.25 MG (50000 UNIT) CAPS capsule Take 1 capsule (50,000 Units total) by mouth every Friday. 11/25/20   Steele Sizer, MD  zonisamide (ZONEGRAN) 100 MG capsule Take 400 mg by mouth at bedtime.     Roque Cash, MD    Family History Family History  Problem Relation Age of Onset   Diabetes Mother    Breast cancer Paternal Grandmother 18   Cancer Paternal Grandmother    Cancer Paternal Aunt    Cancer Maternal Grandmother    Heart disease Neg Hx     Social History Social History   Tobacco Use   Smoking status: Never   Smokeless tobacco: Never  Vaping Use   Vaping Use: Never used  Substance Use Topics   Alcohol use: Yes    Comment: occass   Drug use: No     Allergies   Peanuts [peanut oil] and Aspirin   Review of Systems Review of Systems  See HPI Physical Exam Triage Vital Signs ED Triage Vitals  Enc Vitals Group     BP 08/06/21 1250 137/85     Pulse Rate 08/06/21 1250 (!) 108     Resp 08/06/21 1250 16     Temp 08/06/21 1250 98.4 F (36.9 C)     Temp Source 08/06/21 1250 Oral     SpO2 08/06/21 1250 98 %     Weight 08/06/21 1253 (!) 315 lb (142.9 kg)     Height 08/06/21 1253 5' 7"  (1.702 m)     Head Circumference --      Peak Flow --      Pain Score 08/06/21 1252 4     Pain Loc --      Pain Edu? --      Excl. in [place]? --    No data found.  Updated  Vital Signs BP 137/85 (BP Location: Right Arm)   Pulse (!) 108   Temp 98.4 F (36.9 C) (Oral)   Resp 16   Ht 5' 7"  (1.702 m)   Wt (!) 142.9 kg   SpO2 98%   BMI 49.34 kg/m       Physical Exam Constitutional:      General: She is not in acute distress.    Appearance: She is well-developed. She is obese.     Comments: Left arm in sling (humerus fracture)  HENT:     Head: Normocephalic and atraumatic.     Right Ear: Tympanic membrane, ear canal and external ear normal.     Left Ear: Tympanic membrane, ear canal and external ear normal.     Nose: Congestion and rhinorrhea present.     Mouth/Throat:     Pharynx: Posterior oropharyngeal erythema present.     Comments: Maxillary sinuses tender Eyes:     Conjunctiva/sclera: Conjunctivae normal.     Pupils: Pupils are equal, round, and reactive to light.  Cardiovascular:     Rate and Rhythm: Normal rate. Rhythm irregular.  Pulmonary:     Effort: Pulmonary effort is normal. No respiratory distress.     Breath sounds: No wheezing or rales.     Comments: Breath sounds diminished Abdominal:     General: There is no distension.     Palpations: Abdomen is soft.  Musculoskeletal:        General: Normal range of motion.     Cervical back: Normal range of motion.  Lymphadenopathy:     Cervical: No cervical adenopathy.  Skin:    General: Skin is warm and dry.  Neurological:     Mental Status: She is alert.  Psychiatric:        Mood and Affect: Mood normal.        Behavior: Behavior normal.     UC Treatments / Results  Labs (all labs ordered are listed, but only abnormal results are displayed) Labs Reviewed - No data to display  EKG   Radiology No results found.  Procedures Procedures (including critical care time)  Medications Ordered in UC Medications - No data to display  Initial Impression / Assessment and Plan / UC Course  I have reviewed the triage vital signs and the nursing notes.  Pertinent labs & imaging  results that were available during my care of the patient were reviewed by me and considered in my medical decision making (see chart for details).      Final Clinical Impressions(s) / UC Diagnoses   Final diagnoses:  None  Acute bacterial sinusitis   Discharge Instructions      May continue over-the-counter cough and cold medicine as needed Take amoxicillin if you fail to improve after 7 days   ED Prescriptions     Medication Sig Dispense Auth. Provider   amoxicillin (AMOXIL) 875 MG tablet Take 1 tablet (875 mg total) by mouth 2 (two) times daily for 10 days. 14 tablet Raylene Everts, MD      PDMP not reviewed this encounter.   Raylene Everts, MD 08/06/21 1500

## 2021-08-06 NOTE — ED Triage Notes (Addendum)
Sinus pressure & cough since Tuesday  OTC nyquil & sudafed w/ tylenol COVID home test - negative Sat  & Sun COVID 11/2020 COVID vaccine x 2 - no booster

## 2021-08-06 NOTE — Discharge Instructions (Signed)
May continue over-the-counter cough and cold medicine as needed Take amoxicillin if you fail to improve after 7 days

## 2021-08-14 ENCOUNTER — Other Ambulatory Visit: Payer: Self-pay

## 2021-08-14 ENCOUNTER — Ambulatory Visit
Admission: RE | Admit: 2021-08-14 | Discharge: 2021-08-14 | Disposition: A | Discharge status: Home / Self Care | Payer: 59 | Source: Ambulatory Visit | Attending: Emergency Medicine | Admitting: Emergency Medicine

## 2021-08-14 VITALS — BP 132/84 | HR 100 | Temp 98.9°F | Resp 18

## 2021-08-14 DIAGNOSIS — J329 Chronic sinusitis, unspecified: Secondary | ICD-10-CM

## 2021-08-14 DIAGNOSIS — B9689 Other specified bacterial agents as the cause of diseases classified elsewhere: Secondary | ICD-10-CM

## 2021-08-14 MED ORDER — AMOXICILLIN 875 MG PO TABS: 875.0000 mg | ORAL_TABLET | Freq: Two times a day (BID) | ORAL | 0 refills | Status: AC

## 2021-08-14 MED ORDER — PREDNISONE 10 MG PO TABS: ORAL_TABLET | ORAL | 0 refills | Status: AC

## 2021-08-14 NOTE — ED Provider Notes (Signed)
CHIEF COMPLAINT:   Chief Complaint  Patient presents with   Nasal Congestion   Facial Pain     SUBJECTIVE/HPI:  HPI [name] presents today with sinus pain for the last 2 weeks.  Patient reports the use of amoxicillin for which she was prescribed on 08/06/2021. Patient does not report any shortness of breath, chest pain, palpitations, visual changes, weakness, tingling, headache, nausea, vomiting, diarrhea, fever, chills.   has a past medical history of Allergic rhinitis, Breast discharge (06/05/2017), Breast mass (12/06/2016), COVID-19 virus infection (11/2020), Diabetes mellitus without complication ([place]), Dyslipidemia, Epilepsy ([place]), Febrile seizures ([place]), Galactorrhea, gestational diabetes, Hypertension, Kidney stones, Morbid obesity ([place]), Obesity, Seizures ([place]), Sleep apnea, Syncope and collapse, and Tachycardia.  ROS:  Review of Systems See Subjective/HPI Medications, Allergies and Problem List personally reviewed in Epic today OBJECTIVE:   Vitals:   08/14/21 1817  BP: 132/84  Pulse: 100  Resp: 18  Temp: 98.9 F (37.2 C)  SpO2: 95%    Physical Exam   General: Appears well-developed and well-nourished. No acute distress.  HEENT Head: Normocephalic and atraumatic.  + maxillary sinus tenderness noted to palpation. Ears: Hearing grossly intact, no drainage or visible deformity.  Nose: No nasal deviation.  Mouth/Throat: No stridor or tracheal deviation.  Non erythematous posterior pharynx noted with clear drainage present.  No white patchy exudate noted. Eyes: Conjunctivae and EOM are normal. No eye drainage or scleral icterus bilaterally.  Neck: Normal range of motion, neck is supple.  Cardiovascular: Normal rate Pulm/Chest: No respiratory distress Neurological: Alert and oriented to person, place, and time.  Skin: Skin is warm and dry.  No rashes, lesions, abrasions or bruising noted to skin.   Psychiatric: Normal mood, affect, behavior, and  thought content.   Vital signs and nursing note reviewed.   Patient stable and cooperative with examination. PROCEDURES:    LABS/X-RAYS/EKG/MEDS:   No results found for any visits on 08/14/21.  MEDICAL DECISION MAKING:   Patient presents with sinus pain for the last 2 weeks.  Patient reports the use of amoxicillin for which she was prescribed on 08/06/2021. Patient does not report any shortness of breath, chest pain, palpitations, visual changes, weakness, tingling, headache, nausea, vomiting, diarrhea, fever, chills.  Given symptoms along with assessment findings, concern for continued bacterial sinusitis.  The patient was sent a 7-day course of amoxicillin to her preferred pharmacy.  Did send an additional 3 days of this medication along with prednisone.  Patient reports that she has used prednisone in the past and that she has tolerated this well.  Advised that if her blood glucose is elevated above 200 she will need to taper off of the prednisone.  Advised about home treatment and care to include fluids, rest, Tylenol, ibuprofen, Sudafed and Flonase.  Advised to return to clinic for new or worse swelling or redness in her face, around her eyes or new high fever.  Patient verbalized understanding and agreed with treatment plan.  Patient stable upon discharge. ASSESSMENT/PLAN:  1. Bacterial sinusitis - amoxicillin (AMOXIL) 875 MG tablet; Take 1 tablet (875 mg total) by mouth 2 (two) times daily for 3 days.  Dispense: 6 tablet; Refill: 0 - predniSONE (DELTASONE) 10 MG tablet; Take 6 tablets (60 mg total) by mouth daily for 1 day, THEN 5 tablets (50 mg total) daily for 1 day, THEN 4 tablets (40 mg total) daily for 1 day, THEN 3 tablets (30 mg total) daily for 1 day, THEN 2 tablets (20 mg total)  daily for 1 day, THEN 1 tablet (10 mg total) daily for 1 day.  Dispense: 21 tablet; Refill: 0 Instructions about new medications and side effects provided.  Plan:   Discharge Instructions       Continue taking amoxicillin and begin taking prednisone as prescribed.  If you begin to notice that your blood sugar is elevated above 200 you will need to taper off of the prednisone.  Sinusitis is an infection of the lining of the sinus cavities in your head. Sinusitis often follows a cold. It causes pain and pressure in your head and face. Take antibiotics as directed. Do not stop taking them just because you feel better. You need to take the full course of antibiotics. Rest, push lots of fluids (especially water), and utilize supportive care for symptoms. Breathe warm, moist area from a steamy shower, hot bath, or sink filled with hot water.  Avoid cold, dry air.  Using a humidifier in your home may help.  Follow the directions for cleaning the machine. Put a hot, wet towel or a warm gel pack on your face 3-4 times a day for 5-10 minutes each time. You may take acetaminophen (Tylenol) every 4-6 hours and ibuprofen every 6-8 hours for muscle pain, joint pain, headaches (you may also alternate these medications). Sudafed (pseudophedrine) is sold behind the counter and can help reduce nasal pressure; avoid taking this if you have high blood pressure or feel jittery. Sudafed PE (phenylephrine) can be a helpful, short-term, over-the-counter alternative to limit side effects or if you have high blood pressure.  Flonase nasal spray can help alleviate congestion and sinus pressure. Many patients choose Afrin as a nasal decongestant; do not use for more than 3 days for risk of rebound (increased symptoms after stopping medication).  Saline nasal sprays or rinses can also help nasal congestion (use bottled or sterile water). Warm tea with lemon and honey can sooth sore throat and cough, as can cough drops.   Return to clinic for new or worse swelling or redness in your face or around your eyes, or if you have a new or higher fever.            Erin Good, [place] 08/14/21 [phone]

## 2021-08-14 NOTE — ED Triage Notes (Signed)
Pt here with recurrent sinus pain and congestion x 2 weeks despite abx tx.

## 2021-08-14 NOTE — Discharge Instructions (Addendum)
Continue taking amoxicillin and begin taking prednisone as prescribed.  If you begin to notice that your blood sugar is elevated above 200 you will need to taper off of the prednisone.  Sinusitis is an infection of the lining of the sinus cavities in your head. Sinusitis often follows a cold. It causes pain and pressure in your head and face. Take antibiotics as directed. Do not stop taking them just because you feel better. You need to take the full course of antibiotics. Rest, push lots of fluids (especially water), and utilize supportive care for symptoms. Breathe warm, moist area from a steamy shower, hot bath, or sink filled with hot water.  Avoid cold, dry air.  Using a humidifier in your home may help.  Follow the directions for cleaning the machine. Put a hot, wet towel or a warm gel pack on your face 3-4 times a day for 5-10 minutes each time. You may take acetaminophen (Tylenol) every 4-6 hours and ibuprofen every 6-8 hours for muscle pain, joint pain, headaches (you may also alternate these medications). Sudafed (pseudophedrine) is sold behind the counter and can help reduce nasal pressure; avoid taking this if you have high blood pressure or feel jittery. Sudafed PE (phenylephrine) can be a helpful, short-term, over-the-counter alternative to limit side effects or if you have high blood pressure.  Flonase nasal spray can help alleviate congestion and sinus pressure. Many patients choose Afrin as a nasal decongestant; do not use for more than 3 days for risk of rebound (increased symptoms after stopping medication).  Saline nasal sprays or rinses can also help nasal congestion (use bottled or sterile water). Warm tea with lemon and honey can sooth sore throat and cough, as can cough drops.   Return to clinic for new or worse swelling or redness in your face or around your eyes, or if you have a new or higher fever.

## 2021-08-17 ENCOUNTER — Encounter: Payer: Self-pay | Admitting: Internal Medicine

## 2021-08-24 ENCOUNTER — Encounter: Payer: 59 | Attending: General Surgery | Admitting: Skilled Nursing Facility1

## 2021-08-24 ENCOUNTER — Other Ambulatory Visit: Payer: Self-pay

## 2021-08-24 ENCOUNTER — Encounter: Payer: Self-pay | Admitting: Skilled Nursing Facility1

## 2021-08-24 DIAGNOSIS — E119 Type 2 diabetes mellitus without complications: Secondary | ICD-10-CM | POA: Diagnosis not present

## 2021-08-24 NOTE — Progress Notes (Signed)
Nutrition Assessment for Bariatric Surgery Medical Nutrition Therapy  Referral stated Supervised Weight Loss (SWL) visits needed: not listed  Planned surgery: Sleeve Gastrectomy  Pt expectation of surgery: to get healthier Pt expectation of dietitian: to educate  Pt completed visits.   Pt has cleared nutrition requirements.     NUTRITION ASSESSMENT   Anthropometrics  Start weight at [facility]: 319 Height: 67 in BMI: 50.04 kg/m2     Clinical  Medical hx: epilepsy, DM type 2 Medications: ozempic, metformin, lantus   Labs: A1C 7.6 (10/03/2020) Notable signs/symptoms: none reported  Any previous Deficiencies? No  Micronutrient Nutrition Focused Physical Exam: Hair: No issues observed Eyes: No issues observed Mouth: No issues observed Neck: No issues observed Nails: No issues observed Skin: No issues observed  Lifestyle & Dietary Hx  Pt states she does not wear her C-PAP every night.  Pt states she has stopped eating out and has been watching her portion sizes. Pt states she has understood a better place when to stop eating.   Pt states she checks her blood sugar every morning: fasting: 175-220.   Pt states e has been trying to cut out red meat from her diet sating it makes her feel heavy. Pt states she has been trying to pay closer attention to how food makes her feels and trying to recognize when she is satisfied rather than full.    24-Hr Dietary Recall First Meal: fruit or yogurt Snack:  Second Meal: tuna sandwich Snack:  Third Meal: chicken + green beans + alfredo pasta or air fried salmon Snack:  Beverages: diet soda, gatorade, water, carbonated water   Estimated Energy Needs Calories: 1600 Carbohydrate: 180g Protein: 120g Fat: 44g   NUTRITION DIAGNOSIS  Overweight/obesity ([place]-3.3) related to past poor dietary habits and physical inactivity as evidenced by patient w/ planned sleeve gastrectomy surgery following dietary guidelines for continued weight  loss.    NUTRITION INTERVENTION  Nutrition counseling (C-1) and education (E-2) to facilitate bariatric surgery goals.   Pre-Op Goals Reviewed with the Patient Track food and beverage intake (pen and paper, MyFitness Pal, Baritastic app, etc.) Make healthy food choices while monitoring portion sizes Consume 3 meals per day or try to eat every 3-5 hours Avoid concentrated sugars and fried foods Keep sugar & fat in the single digits per serving on food labels Practice CHEWING your food (aim for applesauce consistency) Practice not drinking 15 minutes before, during, and 30 minutes after each meal and snack Avoid all carbonated beverages (ex: soda, sparkling beverages)  Limit caffeinated beverages (ex: coffee, tea, energy drinks) Avoid all sugar-sweetened beverages (ex: regular soda, sports drinks)  Avoid alcohol  Aim for 64-100 ounces of FLUID daily (with at least half of fluid intake being plain water)  Aim for at least 60-80 grams of PROTEIN daily Look for a liquid protein source that contains ?15 g protein and ?5 g carbohydrate (ex: shakes, drinks, shots) Make a list of non-food related activities Physical activity is an important part of a healthy lifestyle so keep it moving! The goal is to reach 150 minutes of exercise per week, including cardiovascular and weight baring activity.  *Goals that are bolded indicate the pt would like to start working towards these  Handouts Provided Include  Bariatric Surgery handouts (Nutrition Visits, Pre-Op Goals, Protein Shakes, Vitamins & Minerals)  Learning Style & Readiness for Change Teaching method utilized: Visual & Auditory  Demonstrated degree of understanding via: Teach Back  Barriers to learning/adherence to lifestyle change: none identified  MONITORING & EVALUATION Dietary intake, weekly physical activity, body weight, and pre-op goals reached at next nutrition visit.    Next Steps  Patient is to follow up at [facility] for Pre-Op  Class >2 weeks before surgery for further nutrition education.  Pt has completed visits. No further supervised visits required/recomended

## 2021-08-25 ENCOUNTER — Other Ambulatory Visit: Payer: Self-pay | Admitting: Internal Medicine

## 2021-08-25 ENCOUNTER — Encounter: Payer: Self-pay | Admitting: Internal Medicine

## 2021-08-25 ENCOUNTER — Ambulatory Visit (INDEPENDENT_AMBULATORY_CARE_PROVIDER_SITE_OTHER): Payer: 59 | Admitting: Internal Medicine

## 2021-08-25 VITALS — BP 130/82 | HR 105 | Ht 67.0 in | Wt 321.6 lb

## 2021-08-25 DIAGNOSIS — E785 Hyperlipidemia, unspecified: Secondary | ICD-10-CM

## 2021-08-25 DIAGNOSIS — E042 Nontoxic multinodular goiter: Secondary | ICD-10-CM | POA: Diagnosis not present

## 2021-08-25 DIAGNOSIS — Z794 Long term (current) use of insulin: Secondary | ICD-10-CM | POA: Diagnosis not present

## 2021-08-25 DIAGNOSIS — E11319 Type 2 diabetes mellitus with unspecified diabetic retinopathy without macular edema: Secondary | ICD-10-CM

## 2021-08-25 MED ORDER — LANTUS SOLOSTAR 100 UNIT/ML ~~LOC~~ SOPN: PEN_INJECTOR | SUBCUTANEOUS | 3 refills | Status: DC

## 2021-08-25 MED ORDER — METFORMIN HCL ER 500 MG PO TB24: 2000.0000 mg | ORAL_TABLET | Freq: Every day | ORAL | 3 refills | Status: DC

## 2021-08-25 MED ORDER — TIRZEPATIDE 7.5 MG/0.5ML ~~LOC~~ SOAJ: 7.5000 mg | SUBCUTANEOUS | 5 refills | Status: DC

## 2021-08-25 NOTE — Progress Notes (Addendum)
Patient ID: DIETRICH Good, female   DOB: 1976-04-12, 45 y.o.   MRN: [id number]   This visit occurred during the SARS-CoV-2 public health emergency.  Safety protocols were in place, including screening questions prior to the visit, additional usage of staff PPE, and extensive cleaning of exam room while observing appropriate contact time as indicated for disinfecting solutions.   HPI: Erin Good is a 45 y.o.-year-old female, initially referred by her PCP, Dr. Ancil Boozer, presenting for follow-up for DM2, dx in ~2015, insulin-dependent since 2019, uncontrolled, with complications (diabetic retinopathy, microalbuminuria) and also for thyroid nodules.  Last visit 10 months ago.  Interim history: No increased urination, blurry vision, nausea, chest pain. She recently had sinusitis 2 weeks ago >> on Prednisone >> sugars higher: 330, now starting to improve. She had COVID-19 in 11/2020. She recovered well. She sees Bariatrics - preparing for gastric sleeve surgery.  Reviewed HbA1c levels: Lab Results  Component Value Date   HGBA1C 7.6 (H) 10/03/2020   HGBA1C 7.9 (A) 08/15/2020   HGBA1C 9.0 (H) 06/30/2020   HGBA1C 8.3 (A) 05/30/2020   HGBA1C 8.0 (A) 11/12/2019   HGBA1C 10.9 (H) 05/25/2019   HGBA1C 10.0 (A) 01/29/2019   HGBA1C 10.4 (A) 04/01/2018   HGBA1C 9.8 12/18/2017   HGBA1C 9.4 09/17/2017   Pt was on a regimen of: - Metformin ER 750 mg 2x a day, with meals - Soliqua (Lantus + Lixisenatide) 50 units daily in am  Currently on: - Metformin ER 750 mg 2x a day with meals >> 1500 >> 2000 mg with dinner - Lantus 50 >> 56 units at bedtime - Ozempic 1 mg weekly - not in last 3 weeks. Coral Ceo 06/2020 >> not covered.  She checks her sugars 1-2 times a day-no log, no meter: - am: 146-200, 280 >> 220-330, 393 >> 140-160, 170 >> 120-155 - 2h after b'fast: n/c >> 85-130 >> n/c - before lunch: n/c >> 92-180s >> n/c - 2h after lunch: 98-118 >> n/c >> 140-180 >> 160s-210 -  before dinner: 120-150 >> 150-201 >> 140s  >> n/c - 2h after dinner: n/c - bedtime: n/c >> 150s >> n/c >> 170-330 - nighttime: n/c Lowest sugar was98 >> 92 >> 80 >> 120; she has hypoglycemia awareness at 100. Highest sugar was 393 >> 170 >> 330.  Glucometer: [employer] advance  Pt's meals are: - Breakfast: 2 eggs + 3 pieces of bacon >> 2 eggs - snack: fruit - Lunch: salad - Dinner:meat + veggie + starch (rice + pasta) - Snacks: 1  Reducing sodas: Coke >> sparkling water. I did advise her to stop juice in the past >> off now.   -No CKD, last BUN/creatinine:  Lab Results  Component Value Date   BUN 10 10/03/2020   BUN 6 07/13/2020   CREATININE 0.61 10/03/2020   CREATININE 0.67 07/13/2020  Not on ACE inhibitor/ARB.  ACR levels are high: Lab Results  Component Value Date   MICRALBCREAT 3.9 10/27/2020   MICRALBCREAT 33 (H) 11/12/2019   MICRALBCREAT 30 (H) 01/29/2019   -+ HL; last set of lipids: Lab Results  Component Value Date   CHOL 217 (H) 10/27/2020   HDL 40.50 10/27/2020   LDLCALC 149 (H) 10/27/2020   TRIG 139.0 10/27/2020   CHOLHDL 5 10/27/2020  At last visit, she was on Crestor 10 mg daily and I advised her to increase the dose to 20 mg daily.  - last eye exam was in 12/2020: + DR  - no numbness  and tingling in her feet.  Pt has FH of DM in mother, MGF.  Thyroid nodules:  Reviewed previous work-up: 06/01/2019: Thyroid ultrasound: Parenchymal Echotexture: Mildly heterogenous Isthmus: 0.4 cm thickness Right lobe: 5 x 2.4 x 2.8 cm Left lobe: 4.5 x 2 x 2.1 cm    Estimated total number of nodules >/= 1 cm: 4   Nodule # 1: Location: Right; Mid Maximum size: 1.6 cm; Other 2 dimensions: 1.5 x 1.4 cm Composition: mixed cystic and solid (1) Echogenicity: hypoechoic (2) *Given size (>/= 1.5 - 2.4 cm) and appearance, a follow-up ultrasound in 1 year should be considered based on TI-RADS criteria. _________________________________________________________   Nodule #  2: Location: Right; Inferior Maximum size: 2.3 cm; Other 2 dimensions: 1.9 x 1.9 cm Composition: solid/almost completely solid (2) Echogenicity: hypoechoic (2)  **Given size (>/= 1.5 cm) and appearance, fine needle aspiration of this moderately suspicious nodule should be considered based on TI-RADS criteria. _________________________________________________   Nodule # 3: Location: Left; Inferior Maximum size: 1.4 cm; Other 2 dimensions: 1.2 x 1.2 cm Composition: solid/almost completely solid (2) Echogenicity: isoechoic (1)  Given size (<1.4 cm) and appearance, this nodule does NOT meet TI-RADS criteria for biopsy or dedicated follow-up. _________________________________________________________   Nodule # 4: Location: Left; Mid Maximum size: 1.8 cm; Other 2 dimensions: 1.6 x 1.3 cm Composition: solid/almost completely solid (2) Echogenicity: hypoechoic (2) **Given size (>/= 1.5 cm) and appearance, fine needle aspiration of this moderately suspicious nodule should be considered based on TI-RADS criteria.   IMPRESSION: 1. Thyromegaly with bilateral nodules. 2. Recommend FNA biopsy of moderately suspicious 1.8 cm mid left AND 2.3 cm inferior right nodules. 3. Recommend annual/biennial ultrasound follow-up of additional nodules as above, until stability x5 years confirmed.  08/05/2019: FNA of the 2 nodules: Clinical History: Right inferior 2.3cm; Other 2 dimensions: 1.9 x 1.9cm,  Solid / almost completely solid, Hypoechoic, TI-RADS total points 4  Specimen Submitted:  A. THYROID, RLP, FINE NEEDLE ASPIRATION:  DIAGNOSIS:  - Consistent with benign follicular nodule (Bethesda category II)  SPECIMEN ADEQUACY:  Satisfactory for evaluation   Clinical History: Left mid 1.8cm; Other 2 dimensions: 1.6 x 1.3cm, Solid  / almost completely solid, Hypoechoic, TI-RADS total points 4  Specimen Submitted:  A. THYROID, LMP, FINE NEEDLE  ASPIRATION:  DIAGNOSIS:  - Consistent with benign  follicular nodule (Bethesda category II)  SPECIMEN ADEQUACY:  Satisfactory for evaluation  Reviewed her TFTs: Lab Results  Component Value Date   TSH 1.74 10/27/2020   TSH 1.35 05/25/2019   TSH 1.070 07/28/2015   TSH 1.13 10/15/2014   Pt denies: - feeling nodules in neck - hoarseness - dysphagia - choking - SOB with lying down  She also has a history of PCOS and distant history of epilepsy at 45 years old.  ROS: Signs see HPI  I reviewed pt's medications, allergies, PMH, social hx, family hx, and changes were documented in the history of present illness. Otherwise, unchanged from my initial visit note.  Past Medical History:  Diagnosis Date   Allergic rhinitis    Breast discharge 06/05/2017   2 weeks ago left   Breast mass 12/06/2016   left   COVID-19 virus infection 11/2020   Diabetes mellitus without complication ([place])    Dyslipidemia    Epilepsy ([place])    Febrile seizures ([place])    Galactorrhea    Hx gestational diabetes    Hypertension    Kidney stones    Morbid obesity ([place])    Obesity  Seizures ([place])    Sleep apnea    Syncope and collapse    Tachycardia    Past Surgical History:  Procedure Laterality Date   BREAST BIOPSY Left 2018   benign   CESAREAN SECTION     X 2   IRRIGATION AND DEBRIDEMENT SHOULDER Left 10/12/2020   Procedure: IRRIGATION AND DEBRIDEMENT SHOULDER;  Surgeon: Hiram Gash, MD;  Location: [facility];  Service: Orthopedics;  Laterality: Left;   RIGHT OOPHORECTOMY Right 2001   benign tumor   TUBAL LIGATION  2007   Social History   Socioeconomic History   Marital status: Married    Spouse name: Roderic Palau   Number of children: 2   Years of education: College   Highest education level: Not on file  Occupational History   Occupation: Optometrist  Tobacco Use   Smoking status: Never   Smokeless tobacco: Never  Vaping Use   Vaping Use: Never used  Substance and Sexual Activity   Alcohol use: Yes    Comment: occass   Drug use: No    Sexual activity: Yes    Partners: Male    Birth control/protection: Surgical    Comment: tubial lig  Other Topics Concern   Not on file  Social History Narrative   Not on file   Social Determinants of Health   Financial Resource Strain: Not on file  Food Insecurity: Not on file  Transportation Needs: Not on file  Physical Activity: Not on file  Stress: Not on file  Social Connections: Not on file  Intimate Partner Violence: Not on file   Meds: Current Outpatient Medications on File Prior to Visit  Medication Sig Dispense Refill   clonazePAM (KLONOPIN) 0.5 MG tablet Take 0.5 mg by mouth daily as needed (Seizures).   3   diclofenac (VOLTAREN) 75 MG EC tablet Take 1 tablet (75 mg total) by mouth 2 (two) times daily. 60 tablet 0   EPINEPHrine 0.3 mg/0.3 mL IJ SOAJ injection Inject 0.3 mg into the muscle as needed for anaphylaxis (for anaphylaxis).      fluticasone (FLONASE) 50 MCG/ACT nasal spray Place 1 spray into both nostrils daily. 16 g 0   Insulin Pen Needle 32G X 4 MM MISC Use 1x a day 100 each 3   LANTUS SOLOSTAR 100 UNIT/ML Solostar Pen INJECT 50 UNITS INTO THE SKIN DAILY 15 mL 2   metFORMIN (GLUCOPHAGE-XR) 500 MG 24 hr tablet Take 4 tablets (2,000 mg total) by mouth daily with supper. 360 tablet 3   methocarbamol (ROBAXIN) 500 MG tablet Take 500 mg by mouth every 8 (eight) hours as needed.     OneTouch Delica Lancets 62V MISC Use 2x a day with OneTouch Verio Flex 200 each 3   ONETOUCH VERIO test strip USE 2X A DAY WITH ONETOUCH VERIO FLEX 50 strip 15   OZEMPIC, 1 MG/DOSE, 4 MG/3ML SOPN INJECT 1MG INTO THE SKIN ONCE PER WEEK 3 mL 0   rosuvastatin (CRESTOR) 20 MG tablet Take 1 tablet (20 mg total) by mouth daily. 90 tablet 3   Spacer/Aero-Holding Chambers (OPTICHAMBER DIAMOND-LG MASK) DEVI      tiZANidine (ZANAFLEX) 2 MG tablet Take 2 mg by mouth at bedtime as needed for muscle spasms.      Vitamin D, Ergocalciferol, (DRISDOL) 1.25 MG (50000 UNIT) CAPS capsule Take 1 capsule  (50,000 Units total) by mouth every Friday. 12 capsule 0   zonisamide (ZONEGRAN) 100 MG capsule Take 400 mg by mouth at bedtime.  No current facility-administered medications on file prior to visit.   Allergies  Allergen Reactions   Peanuts [Peanut Oil] Anaphylaxis   Aspirin Other (See Comments)    Does not take because of her epilepsy/seizure    Family History  Problem Relation Age of Onset   Diabetes Mother    Breast cancer Paternal Grandmother 85   Cancer Paternal Grandmother    Cancer Paternal Aunt    Cancer Maternal Grandmother    Heart disease Neg Hx    PE: BP 130/82 (BP Location: Right Arm, Patient Position: Sitting, Cuff Size: Normal)   Pulse (!) 105   Ht 5' 7"  (1.702 m)   Wt (!) 321 lb 9.6 oz (145.9 kg)   SpO2 97%   BMI 50.37 kg/m  Wt Readings from Last 3 Encounters:  08/25/21 (!) 321 lb 9.6 oz (145.9 kg)  08/24/21 (!) 319 lb 8 oz (144.9 kg)  08/06/21 (!) 315 lb (142.9 kg)   Constitutional: overweight, in NAD Eyes: PERRLA, EOMI, no exophthalmos ENT: moist mucous membranes, no thyromegaly, no cervical lymphadenopathy Cardiovascular: RRR, No MRG Respiratory: CTA B Gastrointestinal: abdomen soft, NT, ND, BS+ Musculoskeletal: no deformities, strength intact in all 4 Skin: moist, warm, no rashes Neurological: no tremor with outstretched hands, DTR normal in all 4  ASSESSMENT: 1. DM2, insulin-dependent, uncontrolled, with complications - DR - MAU  2.  Thyroid nodules  3. HL  4. Obesity class 3  PLAN:  1. Patient with longstanding, uncontrolled, type 2 diabetes, on oral antidiabetic regimen with metformin, also, injectable regimen with daily long-acting insulin and weekly GLP-1 receptor agonist, returning after a longer absence of 10 months. -At last visit, sugars improved significantly after moving Lantus at night.  She was taking metformin with dinner and she was tolerating it well and sugars in the morning improved.  However, they were still above  target so we increased the dose of metformin to 2000 mg daily.  I advised her that if the sugars in the morning were not at goal after this change, to increase her Lantus dose.  HbA1c at last visit was 7.6%, improved. -At today's visit, patient returns after a long absence 10 months.  Sugars are higher and they have been up to 300s during her recent prednisone taper.  They are usually lower in the morning and increase after meals especially after dinner.  She has been out of Ozempic now for 3 weeks due to delays at the pharmacy.  She tells me that every time she tries to refill it, she needs to miss doses as they do not have it in stock.  At this visit, she inquires about Mounjaro.  We will can try to switch to this from [place].  I recommended to start with 7.5 mg weekly and will increase the dose as tolerated. -We will continue metformin and Lantus for now.  I refilled her prescriptions. - I suggested to:  Patient Instructions  Please continue: - Metformin ER 2000 mg with dinner - Lantus 56 units at bedtime  Try to switch from Ozempic to: - Mounjaro 7.5 mg weekly  We will need to schedule another thyroid ultrasound. I will order this today.   Please return in 3-4 months with your sugar log.   - we checked her HbA1c: 9.7% (higher) - advised to check sugars at different times of the day - 1-2x a day, rotating check times - advised for yearly eye exams >> she is UTD - return to clinic in 3-4 months  2.  Thyroid nodules -No neck compression symptoms -Latest TSH was normal: Lab Results  Component Value Date   TSH 1.74 10/27/2020  -Reviewed the results of her most recent thyroid ultrasound from 05/2019 and 2 of the nodules biopsied from 07/2019.  These are benign. -At last visit I ordered the new ultrasound, but this was not done yet -We will order this again today  3. HL -Reviewed latest lipid panel from 10/2020: LDL was above goal, the rest the fractions were at goal: Lab Results   Component Value Date   CHOL 217 (H) 10/27/2020   HDL 40.50 10/27/2020   LDLCALC 149 (H) 10/27/2020   TRIG 139.0 10/27/2020   CHOLHDL 5 10/27/2020  -At last visit I suggested to increase Crestor to 20 mg daily.  She tolerates this well.  4.  Obesity class III -We will continue Ozempic which should also help with weight loss -She lost a net 6 pounds before last visit and gained 5 pounds since then -She is preparing for gastric bypass surgery (gastric sleeve).  We discussed that afterwards she will most likely come off insulin and possibly also metformin (due to large tablet size) and Mounjaro.  Thyroid U/S (09/18/2021): Parenchymal Echotexture: Mildly heterogenous Isthmus: 0.5 cm, previously 0.4 cm Right lobe: 6.1 x 2.2 x 2.8 cm, previously 5.0 x 2.4 x 2.8 cm Left lobe: 5.3 x 2.3 x 2.4 cm, previously 4.5 x 2.0 x 2.1 cm _________________________________________________________   Estimated total number of nodules >/= 1 cm: 5 _________________________________________________________   Nodule # 1: Location: Right; Superior Maximum size: 2.0 cm; Other 2 dimensions: 1.7 x 1.7 cm, previously 1.6 x 1.5 x 1.4 cm Composition: mixed cystic and solid (1) Echogenicity: isoechoic (1) This nodule does NOT meet TI-RADS criteria for biopsy or dedicated follow-up. _________________________________________________________   Nodule # 2: Location: Right; Mid Maximum size: 2.9 cm; Other 2 dimensions: 2.2 x 1.7 cm, previously 2.3 x 1.9 x 1.9 cm This nodule is mixed cystic and solid, and was previously biopsied.  _________________________________________________________   Nodule # 3: Location: Right; Inferior Maximum size: 1.5 cm; Other 2 dimensions: 1.4 x 0.9 cm Composition: solid/almost completely solid (2) Echogenicity: isoechoic (1) *Given size (>/= 1.5 - 2.4 cm) and appearance, a follow-up ultrasound in 1 year should be considered based on TI-RADS  criteria. _________________________________________________________   Nodule # 4: Location: Left; Mid Maximum size: 2.5 cm; Other 2 dimensions: 2.0 x 1.5 cm, previously 1.8 x 1.6 x 1.2 cm This nodule is mixed cystic and solid, and was previously biopsied. _________________________________________________________   Nodule # 5: Location: Left; Inferior Maximum size: 1.5 cm; Other 2 dimensions: 1.2 x 1.0 cm, previously 1.4 x 1.2 x 1.2 cm Composition: solid/almost completely solid (2) Echogenicity: isoechoic (1) *Given size (>/= 1.5 - 2.4 cm) and appearance, a follow-up ultrasound in 1 year should be considered based on TI-RADS criteria.  _________________________________________________________   No cervical lymphadenopathy, within the imaged neck   IMPRESSION: 1. Multinodular thyroid gland, with similar appearance of previously-biopsied nodules as above. 2. 1.5 cm bilateral TR 3 nodules. Attention on follow-up.  *Given size (>/= 1.5 - 2.4 cm) and appearance, a follow-up ultrasound in 1 year should be considered based on TI-RADS criteria.   Philemon Kingdom, MD PhD [facility] Endocrinology

## 2021-08-25 NOTE — Patient Instructions (Addendum)
Please continue: - Metformin ER 2000 mg with dinner - Lantus 56 units at bedtime  Try to switch from Ozempic to: - Mounjaro 7.5 mg weekly  Please stop at the lab.  We will need to schedule another thyroid ultrasound. I will order this today.   Please return in 3-4 months with your sugar log.

## 2021-08-29 ENCOUNTER — Telehealth: Payer: Self-pay | Admitting: Pharmacy Technician

## 2021-08-29 ENCOUNTER — Other Ambulatory Visit (HOSPITAL_COMMUNITY): Payer: Self-pay

## 2021-08-29 NOTE — Telephone Encounter (Signed)
Patient Advocate Encounter  Received notification from [place] that prior authorization for [facility] 7.5MG is required.   PA submitted on 10.25.22 [place] Status is pending   [facility] will continue to follow  Luciano Cutter, CPhT Patient Advocate Phone: [phone] Fax:  [phone]

## 2021-08-29 NOTE — Telephone Encounter (Signed)
Pa submiteed

## 2021-09-01 ENCOUNTER — Other Ambulatory Visit (HOSPITAL_COMMUNITY): Payer: Self-pay

## 2021-09-01 LAB — POCT GLYCOSYLATED HEMOGLOBIN (HGB A1C): Hemoglobin A1C: 9.7 % — AB (ref 4.0–5.6)

## 2021-09-01 NOTE — Telephone Encounter (Signed)
Patient Advocate Encounter  Prior Authorization for Mounjaro 7.76m has been approved.    PA# PA Case ID: [id number] dates:  through 08/29/22  Per Test Claim Patients co-pay is $5.   Spoke with Pharmacy to Process. They will have to order and will have it Monday. They will inform pt.   AArmanda Magic CPhT [facility] Phone: [phone]:  [phone]

## 2021-09-18 ENCOUNTER — Ambulatory Visit
Admission: RE | Admit: 2021-09-18 | Discharge: 2021-09-18 | Disposition: A | Discharge status: Home / Self Care | Payer: 59 | Source: Ambulatory Visit | Attending: Internal Medicine | Admitting: Internal Medicine

## 2021-09-18 DIAGNOSIS — E042 Nontoxic multinodular goiter: Secondary | ICD-10-CM

## 2021-09-29 ENCOUNTER — Other Ambulatory Visit: Payer: Self-pay | Admitting: Internal Medicine

## 2021-10-02 ENCOUNTER — Encounter: Payer: Self-pay | Admitting: Internal Medicine

## 2021-10-02 ENCOUNTER — Other Ambulatory Visit: Payer: Self-pay | Admitting: Internal Medicine

## 2021-10-02 MED ORDER — TIRZEPATIDE 10 MG/0.5ML ~~LOC~~ SOAJ: 10.0000 mg | SUBCUTANEOUS | 11 refills | Status: DC

## 2021-12-01 ENCOUNTER — Encounter: Payer: Self-pay | Admitting: Internal Medicine

## 2021-12-01 ENCOUNTER — Ambulatory Visit (INDEPENDENT_AMBULATORY_CARE_PROVIDER_SITE_OTHER): Payer: 59 | Admitting: Internal Medicine

## 2021-12-01 ENCOUNTER — Other Ambulatory Visit: Payer: Self-pay

## 2021-12-01 VITALS — BP 130/78 | HR 115 | Ht 67.0 in | Wt 300.8 lb

## 2021-12-01 DIAGNOSIS — E042 Nontoxic multinodular goiter: Secondary | ICD-10-CM

## 2021-12-01 DIAGNOSIS — E785 Hyperlipidemia, unspecified: Secondary | ICD-10-CM | POA: Diagnosis not present

## 2021-12-01 DIAGNOSIS — Z794 Long term (current) use of insulin: Secondary | ICD-10-CM | POA: Diagnosis not present

## 2021-12-01 DIAGNOSIS — E11319 Type 2 diabetes mellitus with unspecified diabetic retinopathy without macular edema: Secondary | ICD-10-CM

## 2021-12-01 LAB — LIPID PANEL
Cholesterol: 210 mg/dL — ABNORMAL HIGH (ref 0–200)
HDL: 34.8 mg/dL — ABNORMAL LOW (ref >39.00)
LDL Cholesterol: 148 mg/dL — ABNORMAL HIGH (ref 0–99)
NonHDL: 175.05
Total CHOL/HDL Ratio: 6
Triglycerides: 133 mg/dL (ref 0.0–149.0)
VLDL: 26.6 mg/dL (ref 0.0–40.0)

## 2021-12-01 LAB — COMPREHENSIVE METABOLIC PANEL
ALT: 22 U/L (ref 0–35)
AST: 17 U/L (ref 0–37)
Albumin: 4.3 g/dL (ref 3.5–5.2)
Alkaline Phosphatase: 91 U/L (ref 39–117)
BUN: 12 mg/dL (ref 6–23)
CO2: 25 mEq/L (ref 19–32)
Calcium: 9.7 mg/dL (ref 8.4–10.5)
Chloride: 103 mEq/L (ref 96–112)
Creatinine, Ser: 0.85 mg/dL (ref 0.40–1.20)
GFR: 83.52 mL/min (ref >60.00)
Glucose, Bld: 146 mg/dL — ABNORMAL HIGH (ref 70–99)
Potassium: 4.1 mEq/L (ref 3.5–5.1)
Sodium: 138 mEq/L (ref 135–145)
Total Bilirubin: 0.3 mg/dL (ref 0.2–1.2)
Total Protein: 7.8 g/dL (ref 6.0–8.3)

## 2021-12-01 LAB — MICROALBUMIN / CREATININE URINE RATIO
Creatinine,U: 225.7 mg/dL
Microalb Creat Ratio: 1.6 mg/g (ref 0.0–30.0)
Microalb, Ur: 3.7 mg/dL — ABNORMAL HIGH (ref 0.0–1.9)

## 2021-12-01 LAB — POCT GLYCOSYLATED HEMOGLOBIN (HGB A1C): Hemoglobin A1C: 7.8 % — AB (ref 4.0–5.6)

## 2021-12-01 LAB — TSH: TSH: 1.05 u[IU]/mL (ref 0.35–5.50)

## 2021-12-01 MED ORDER — TIRZEPATIDE 12.5 MG/0.5ML ~~LOC~~ SOAJ: 12.5000 mg | SUBCUTANEOUS | 3 refills | Status: DC

## 2021-12-01 NOTE — Patient Instructions (Addendum)
Please continue: - Metformin ER 2000 mg with dinner - Lantus 56 units at bedtime  Please increase: - Mounjaro 12.5 mg weekly  Please stop at the lab.  Please return in 3-4 months with your sugar log.

## 2021-12-01 NOTE — Progress Notes (Signed)
Patient ID: Erin Good, female   DOB: 1976-03-09, 46 y.o.   MRN: [id number]   This visit occurred during the SARS-CoV-2 public health emergency.  Safety protocols were in place, including screening questions prior to the visit, additional usage of staff PPE, and extensive cleaning of exam room while observing appropriate contact time as indicated for disinfecting solutions.   HPI: Erin Good is a 46 y.o.-year-old female, initially referred by her PCP, Dr. Ancil Boozer, presenting for follow-up for DM2, dx in ~2015, insulin-dependent since 2019, uncontrolled, with complications (diabetic retinopathy, microalbuminuria) and also for thyroid nodules.  Last visit  3 months ago.  Interim history: No increased urination, blurry vision, nausea, chest pain. She lost ~20 lbs since last OV! She sees bariatrics, was preparing for gastric sleeve surgery, but for now plans are on hold due to the fact that she is losing weight.  Reviewed HbA1c levels: Lab Results  Component Value Date   HGBA1C 9.7 (A) 09/01/2021   HGBA1C 7.6 (H) 10/03/2020   HGBA1C 7.9 (A) 08/15/2020   HGBA1C 9.0 (H) 06/30/2020   HGBA1C 8.3 (A) 05/30/2020   HGBA1C 8.0 (A) 11/12/2019   HGBA1C 10.9 (H) 05/25/2019   HGBA1C 10.0 (A) 01/29/2019   HGBA1C 10.4 (A) 04/01/2018   HGBA1C 9.8 12/18/2017   Pt was on a regimen of: - Metformin ER 750 mg 2x a day, with meals - Soliqua (Lantus + Lixisenatide) 50 units daily in am  Currently on: - Metformin ER 750 mg 2x a day with meals >> 1500 >> 2000 mg with dinner - Lantus 50 >> 56 units at bedtime - Ozempic 1 mg weekly >> Mounjaro 7.5 >> 10 mg weekly-started 08/2021 Tried Farxiga 06/2020 >> not covered.  She checks her sugars 1-2 times a day-no log, no meter: - am: 220-330, 393 >> 140-160, 170 >> 120-155 >> 130-206 - 2h after b'fast: n/c >> 85-130 >> n/c >> 166, 207 - before lunch: n/c >> 92-180s >> n/c - 2h after lunch: 98-118 >> n/c >> 140-180 >> 160s-210 >> 125-153 -  before dinner: 120-150 >> 150-201 >> 140s  >> n/c >> 178-198 - 2h after dinner: n/c - bedtime: n/c >> 150s >> n/c >> 170-330 >> N/c - nighttime: n/c Lowest sugar was98 >> 92 >> 80 >> 120 >> 82; she has hypoglycemia awareness at 100. Highest sugar was 393 >> 170 >> 330 >> 206.  Glucometer: [employer] advance  Pt's meals are: - Breakfast: 2 eggs + 3 pieces of bacon >> 2 eggs - snack: fruit - Lunch: salad - Dinner:meat + veggie + starch (rice + pasta) - Snacks: 1  Reducing sodas: Coke >> sparkling water. I did advise her to stop juice in the past >> off now.   -No CKD, last BUN/creatinine:  Lab Results  Component Value Date   BUN 10 10/03/2020   BUN 6 07/13/2020   CREATININE 0.61 10/03/2020   CREATININE 0.67 07/13/2020  Not on ACE inhibitor/ARB.  ACR levels are high: Lab Results  Component Value Date   MICRALBCREAT 3.9 10/27/2020   MICRALBCREAT 33 (H) 11/12/2019   MICRALBCREAT 30 (H) 01/29/2019   -+ HL; last set of lipids: Lab Results  Component Value Date   CHOL 217 (H) 10/27/2020   HDL 40.50 10/27/2020   LDLCALC 149 (H) 10/27/2020   TRIG 139.0 10/27/2020   CHOLHDL 5 10/27/2020  At last visit, she was on Crestor 10 mg daily and I advised her to increase the dose to 20 mg daily.She takes  this now.  - last eye exam was in 12/2020: + DR  - no numbness and tingling in her feet.  Occasional numbness in the right leg and foot.  Pt has FH of DM in mother, MGF.  Thyroid nodules:  Reviewed previous work-up: 06/01/2019: Thyroid ultrasound: Parenchymal Echotexture: Mildly heterogenous Isthmus: 0.4 cm thickness Right lobe: 5 x 2.4 x 2.8 cm Left lobe: 4.5 x 2 x 2.1 cm    Estimated total number of nodules >/= 1 cm: 4   Nodule # 1: Location: Right; Mid Maximum size: 1.6 cm; Other 2 dimensions: 1.5 x 1.4 cm Composition: mixed cystic and solid (1) Echogenicity: hypoechoic (2) *Given size (>/= 1.5 - 2.4 cm) and appearance, a follow-up ultrasound in 1 year should be considered  based on TI-RADS criteria. _________________________________________________________   Nodule # 2: Location: Right; Inferior Maximum size: 2.3 cm; Other 2 dimensions: 1.9 x 1.9 cm Composition: solid/almost completely solid (2) Echogenicity: hypoechoic (2)  **Given size (>/= 1.5 cm) and appearance, fine needle aspiration of this moderately suspicious nodule should be considered based on TI-RADS criteria. _________________________________________________   Nodule # 3: Location: Left; Inferior Maximum size: 1.4 cm; Other 2 dimensions: 1.2 x 1.2 cm Composition: solid/almost completely solid (2) Echogenicity: isoechoic (1)  Given size (<1.4 cm) and appearance, this nodule does NOT meet TI-RADS criteria for biopsy or dedicated follow-up. _________________________________________________________   Nodule # 4: Location: Left; Mid Maximum size: 1.8 cm; Other 2 dimensions: 1.6 x 1.3 cm Composition: solid/almost completely solid (2) Echogenicity: hypoechoic (2) **Given size (>/= 1.5 cm) and appearance, fine needle aspiration of this moderately suspicious nodule should be considered based on TI-RADS criteria.   IMPRESSION: 1. Thyromegaly with bilateral nodules. 2. Recommend FNA biopsy of moderately suspicious 1.8 cm mid left AND 2.3 cm inferior right nodules. 3. Recommend annual/biennial ultrasound follow-up of additional nodules as above, until stability x5 years confirmed.  08/05/2019: FNA of the 2 nodules: Clinical History: Right inferior 2.3cm; Other 2 dimensions: 1.9 x 1.9cm,  Solid / almost completely solid, Hypoechoic, TI-RADS total points 4  Specimen Submitted:  A. THYROID, RLP, FINE NEEDLE ASPIRATION:  DIAGNOSIS:  - Consistent with benign follicular nodule ([employer]hes[employer] category II)  SPECIMEN ADEQUACY:  Satisfactory for evaluation   Clinical History: Left mid 1.8cm; Other 2 dimensions: 1.6 x 1.3cm, Solid  / almost completely solid, Hypoechoic, TI-RADS total points 4  Specimen  Submitted:  A. THYROID, LMP, FINE NEEDLE  ASPIRATION:  DIAGNOSIS:  - Consistent with benign follicular nodule (Bethesda category II)  SPECIMEN ADEQUACY:  Satisfactory for evaluation  Thyroid U/S (09/18/2021): Parenchymal Echotexture: Mildly heterogenous Isthmus: 0.5 cm, previously 0.4 cm Right lobe: 6.1 x 2.2 x 2.8 cm, previously 5.0 x 2.4 x 2.8 cm Left lobe: 5.3 x 2.3 x 2.4 cm, previously 4.5 x 2.0 x 2.1 cm _________________________________________________________   Estimated total number of nodules >/= 1 cm: 5 _________________________________________________________   Nodule # 1: Location: Right; Superior Maximum size: 2.0 cm; Other 2 dimensions: 1.7 x 1.7 cm, previously 1.6 x 1.5 x 1.4 cm Composition: mixed cystic and solid (1) Echogenicity: isoechoic (1) This nodule does NOT meet TI-RADS criteria for biopsy or dedicated follow-up. _________________________________________________________   Nodule # 2: Location: Right; Mid Maximum size: 2.9 cm; Other 2 dimensions: 2.2 x 1.7 cm, previously 2.3 x 1.9 x 1.9 cm This nodule is mixed cystic and solid, and was previously biopsied.  _________________________________________________________   Nodule # 3: Location: Right; Inferior Maximum size: 1.5 cm; Other 2 dimensions: 1.4 x 0.9 cm  Composition: solid/almost completely solid (2) Echogenicity: isoechoic (1) *Given size (>/= 1.5 - 2.4 cm) and appearance, a follow-up ultrasound in 1 year should be considered based on TI-RADS criteria. _________________________________________________________   Nodule # 4: Location: Left; Mid Maximum size: 2.5 cm; Other 2 dimensions: 2.0 x 1.5 cm, previously 1.8 x 1.6 x 1.2 cm This nodule is mixed cystic and solid, and was previously biopsied. _________________________________________________________   Nodule # 5: Location: Left; Inferior Maximum size: 1.5 cm; Other 2 dimensions: 1.2 x 1.0 cm, previously 1.4 x 1.2 x 1.2 cm Composition:  solid/almost completely solid (2) Echogenicity: isoechoic (1) *Given size (>/= 1.5 - 2.4 cm) and appearance, a follow-up ultrasound in 1 year should be considered based on TI-RADS criteria.  _________________________________________________________   No cervical lymphadenopathy, within the imaged neck   IMPRESSION: 1. Multinodular thyroid gland, with similar appearance of previously-biopsied nodules as above. 2. 1.5 cm bilateral TR 3 nodules. Attention on follow-up.  *Given size (>/= 1.5 - 2.4 cm) and appearance, a follow-up ultrasound in 1 year should be considered based on TI-RADS criteria.  Reviewed her TFTs: Lab Results  Component Value Date   TSH 1.74 10/27/2020   TSH 1.35 05/25/2019   TSH 1.070 07/28/2015   TSH 1.13 10/15/2014   Pt denies: - feeling nodules in neck - hoarseness - dysphagia - choking - SOB with lying down  She also has a history of PCOS and distant history of epilepsy at 46 years old.  ROS: Signs see HPI  I reviewed pt's medications, allergies, PMH, social hx, family hx, and changes were documented in the history of present illness. Otherwise, unchanged from my initial visit note.  Past Medical History:  Diagnosis Date   Allergic rhinitis    Breast discharge 06/05/2017   2 weeks ago left   Breast mass 12/06/2016   left   COVID-19 virus infection 11/2020   Diabetes mellitus without complication ([place])    Dyslipidemia    Epilepsy ([place])    Febrile seizures ([place])    Galactorrhea    Hx gestational diabetes    Hypertension    Kidney stones    Morbid obesity ([place])    Obesity    Seizures ([place])    Sleep apnea    Syncope and collapse    Tachycardia    Past Surgical History:  Procedure Laterality Date   BREAST BIOPSY Left 2018   benign   CESAREAN SECTION     X 2   IRRIGATION AND DEBRIDEMENT SHOULDER Left 10/12/2020   Procedure: IRRIGATION AND DEBRIDEMENT SHOULDER;  Surgeon: Hiram Gash, MD;  Location: [facility];  Service: Orthopedics;   Laterality: Left;   RIGHT OOPHORECTOMY Right 2001   benign tumor   TUBAL LIGATION  2007   Social History   Socioeconomic History   Marital status: Married    Spouse name: Roderic Palau   Number of children: 2   Years of education: College   Highest education level: Not on file  Occupational History   Occupation: Optometrist  Tobacco Use   Smoking status: Never   Smokeless tobacco: Never  Vaping Use   Vaping Use: Never used  Substance and Sexual Activity   Alcohol use: Yes    Comment: occass   Drug use: No   Sexual activity: Yes    Partners: Male    Birth control/protection: Surgical    Comment: tubial lig  Other Topics Concern   Not on file  Social History Narrative   Not on file  Social Determinants of Health   Financial Resource Strain: Not on file  Food Insecurity: Not on file  Transportation Needs: Not on file  Physical Activity: Not on file  Stress: Not on file  Social Connections: Not on file  Intimate Partner Violence: Not on file   Meds: Current Outpatient Medications on File Prior to Visit  Medication Sig Dispense Refill   clonazePAM (KLONOPIN) 0.5 MG tablet Take 0.5 mg by mouth daily as needed (Seizures).   3   EPINEPHrine 0.3 mg/0.3 mL IJ SOAJ injection Inject 0.3 mg into the muscle as needed for anaphylaxis (for anaphylaxis).      fluticasone (FLONASE) 50 MCG/ACT nasal spray Place 1 spray into both nostrils daily. 16 g 0   insulin glargine (LANTUS SOLOSTAR) 100 UNIT/ML Solostar Pen INJECT 56 UNITS INTO THE SKIN DAILY 45 mL 3   Insulin Pen Needle 32G X 4 MM MISC Use 1x a day 100 each 3   metFORMIN (GLUCOPHAGE-XR) 500 MG 24 hr tablet Take 4 tablets (2,000 mg total) by mouth daily with supper. 360 tablet 3   methocarbamol (ROBAXIN) 500 MG tablet Take 500 mg by mouth every 8 (eight) hours as needed.     OneTouch Delica Lancets 02I MISC Use 2x a day with OneTouch Verio Flex 200 each 3   ONETOUCH VERIO test strip USE 2X A DAY WITH ONETOUCH VERIO FLEX 50 strip 15    rosuvastatin (CRESTOR) 20 MG tablet Take 1 tablet (20 mg total) by mouth daily. 90 tablet 3   tirzepatide (MOUNJARO) 10 MG/0.5ML Pen Inject 10 mg into the skin once a week. 2 mL 11   tiZANidine (ZANAFLEX) 2 MG tablet Take 2 mg by mouth at bedtime as needed for muscle spasms.      Vitamin D, Ergocalciferol, (DRISDOL) 1.25 MG (50000 UNIT) CAPS capsule Take 1 capsule (50,000 Units total) by mouth every Friday. 12 capsule 0   zonisamide (ZONEGRAN) 100 MG capsule Take 400 mg by mouth at bedtime.      No current facility-administered medications on file prior to visit.   Allergies  Allergen Reactions   Peanuts [Peanut Oil] Anaphylaxis   Aspirin Other (See Comments)    Does not take because of her epilepsy/seizure    Family History  Problem Relation Age of Onset   Diabetes Mother    Breast cancer Paternal Grandmother 38   Cancer Paternal Grandmother    Cancer Paternal Aunt    Cancer Maternal Grandmother    Heart disease Neg Hx    PE: BP 130/78 (BP Location: Right Arm, Patient Position: Sitting, Cuff Size: Normal)    Pulse (!) 115    Ht 5' 7"  (1.702 m)    Wt (!) 300 lb 12.8 oz (136.4 kg)    SpO2 96%    BMI 47.11 kg/m  Wt Readings from Last 3 Encounters:  12/01/21 (!) 300 lb 12.8 oz (136.4 kg)  08/25/21 (!) 321 lb 9.6 oz (145.9 kg)  08/24/21 (!) 319 lb 8 oz (144.9 kg)   Constitutional: overweight, in NAD Eyes: PERRLA, EOMI, no exophthalmos ENT: moist mucous membranes, no thyromegaly, no cervical lymphadenopathy Cardiovascular: Tachycardia, RR, No MRG Respiratory: CTA B Musculoskeletal: no deformities, strength intact in all 4 Skin: moist, warm, no rashes Neurological: no tremor with outstretched hands, DTR normal in all 4  ASSESSMENT: 1. DM2, insulin-dependent, uncontrolled, with complications - DR - MAU  2.  Thyroid nodules  3. HL  4. Obesity class 3  PLAN:  1. Patient with longstanding, uncontrolled,  type 2 diabetes, on oral antidiabetic regimen with metformin and also  injectable regimen with daily long-acting insulin and weekly GLP-1 receptor agonist, returning at last visit after a longer absence of 10 months.  Her HbA1c increased from 7.6 to 9.7%.  At that time, sugars were higher, up to 300s during the previous prednisone taper.  They were usually lower in the morning and were increasing after meals, especially after dinner.  She mentions that she was out of Ozempic at that time due to problems obtaining the medication.  She inquired about Mounjaro.  We started Mounjaro at 7.5 mg weekly.  A PA for this was approved.  -She is currently on Mounjaro 10 mg weekly.  She did lose weight on this, blood sugars are not as good as I would have expected.  Reviewing her meter download, sugars are still above target in the morning, maybe slightly lower in the last 2 weeks and they are usually better later in the day, but still mostly above target.  She is not checking sugars after dinner and I advised her to start doing so. -At this visit I advised her to try to increase Mounjaro dose further, see if she tolerates it well.  We can continue the rest of the regimen. - I suggested to:  Patient Instructions  Please continue: - Metformin ER 2000 mg with dinner - Lantus 56 units at bedtime  Please increase: - Mounjaro 12.5 mg weekly  Please stop at the lab.  Please return in 3-4 months with your sugar log.   - we checked her HbA1c: 7.8% (was) - advised to check sugars at different times of the day - 1-2x a day, rotating check times - advised for yearly eye exams >> she is UTD - will check annual labs today - return to clinic in 3-4 months  2.  Thyroid nodules -She denies neck compression symptoms -Latest TSH was normal: Lab Results  Component Value Date   TSH 1.74 10/27/2020  -We will repeat her TSH today -Reviewed the results of her previous thyroid ultrasound from 05/2019.  At that time, she had 2 nodules biopsied (07/2019) with benign results. -We repeated her  thyroid ultrasound after last visit and the nodules appeared to be stable -Plan to repeat another ultrasound in a year from the previous  3. HL -Review latest lipid panel from 10/2020: LDL above goal, the rest of the fractions at goal: Lab Results  Component Value Date   CHOL 217 (H) 10/27/2020   HDL 40.50 10/27/2020   LDLCALC 149 (H) 10/27/2020   TRIG 139.0 10/27/2020   CHOLHDL 5 10/27/2020  -She is on Crestor 20 mg daily. She tolerates this well. -she is due for another lipid panel.  We will check this today  4.  Obesity class III -We will continue Mounjaro which should also help with weight loss.   -At last visit, she was preparing for gastric sleeve surgery, but now that she lost 20 pounds, plans are on hold  Component     Latest Ref Rng & Units 12/01/2021  Sodium     135 - 145 mEq/L 138  Potassium     3.5 - 5.1 mEq/L 4.1  Chloride     96 - 112 mEq/L 103  CO2     19 - 32 mEq/L 25  Glucose     70 - 99 mg/dL 146 (H)  BUN     6 - 23 mg/dL 12  Creatinine     0.40 -  1.20 mg/dL 0.85  Total Bilirubin     0.2 - 1.2 mg/dL 0.3  Alkaline Phosphatase     39 - 117 U/L 91  AST     0 - 37 U/L 17  ALT     0 - 35 U/L 22  Total Protein     6.0 - 8.3 g/dL 7.8  Albumin     3.5 - 5.2 g/dL 4.3  GFR     >60.00 mL/min 83.52  Calcium     8.4 - 10.5 mg/dL 9.7  Cholesterol     0 - 200 mg/dL 210 (H)  Triglycerides     0.0 - 149.0 mg/dL 133.0  HDL Cholesterol     >39.00 mg/dL 34.80 (L)  VLDL     0.0 - 40.0 mg/dL 26.6  LDL (calc)     0 - 99 mg/dL 148 (H)  Total CHOL/HDL Ratio      6  NonHDL      175.05  Microalb, Ur     0.0 - 1.9 mg/dL 3.7 (H)  Creatinine,U     mg/dL 225.7  MICROALB/CREAT RATIO     0.0 - 30.0 mg/g 1.6  TSH     0.35 - 5.50 uIU/mL 1.05  LDL is approximately the same, higher than goal.  HDL slightly low.  We will suggest dietary changes - the portfolio diet.  Glucose is also slightly high.  Kidney function and urinary albumin normal. Thyroid tests  normal.   Philemon Kingdom, MD PhD [facility] End[facility]

## 2021-12-21 NOTE — Progress Notes (Signed)
Name: Erin Good   MRN: [id number]    DOB: 1976-09-24   Date:12/22/2021       Progress Note  Subjective  Chief Complaint  Leg Pain  HPI  Right leg pain: she noticed pain from right groin area down anterior medical thigh and sometimes down to her ankle . Usually aching , only at night when laying down and sometimes leg goes numb. No problems walking, denies back pain. Denies bowel or bladder incontinence No injuries. She states symptoms started about one month ago.   DM II: seeing Endo, last A1C was 7.8 % down from 9.7 % . She has associated obesity and dyslipidemia. Taking Crestor and no side effects.   Seizures: she had a seizure Dec 27 th when she ran out of medication, no problems when compliant with medication. She is under the care of neurologist   Morbid obesity: maximum weight was 336 lbs, she is seeing Dr. Renne Crigler, she is doing well on [place] now, she feels medication is curbing her appetite, glucose has also improved  Tachycardia: seen by Dr. Saunders Revel, negative evaluation.   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   Gastro-esophageal reflux disease without esophagitis 99/35/7017   Dysmetabolic syndrome 79/39/0300   Extreme obesity 04/20/2015   NASH (nonalcoholic steatohepatitis) 04/20/2015   Allergy to nuts 04/20/2015   Calculus of kidney 04/20/2015   Type 2 diabetes mellitus with renal  manifestations ([place]) 04/20/2015   Central sleep apnea 11/26/2008   Dyslipidemia 07/02/2008   Leukocytosis 07/29/2007    Past Surgical History:  Procedure Laterality Date   BREAST BIOPSY Left 2018   benign   CESAREAN SECTION     X 2   IRRIGATION AND DEBRIDEMENT SHOULDER Left 10/12/2020   Procedure: IRRIGATION AND DEBRIDEMENT SHOULDER;  Surgeon: Hiram Gash, MD;  Location: [facility];  Service: Orthopedics;  Laterality: Left;   RIGHT OOPHORECTOMY Right 2001   benign tumor   TUBAL LIGATION  2007    Family History  Problem Relation Age of Onset   Diabetes Mother    Breast cancer Paternal Grandmother 63   Cancer Paternal Grandmother    Cancer Paternal Aunt    Cancer Maternal Grandmother    Heart disease Neg Hx     Social History   Tobacco Use   Smoking status: Never   Smokeless tobacco: Never  Substance Use Topics   Alcohol use: Yes    Comment: occass     Current Outpatient Medications:    clonazePAM (KLONOPIN) 0.5 MG tablet, Take 0.5 mg by mouth daily as needed (Seizures). , Disp: , Rfl: 3   EPINEPHrine 0.3 mg/0.3 mL IJ SOAJ injection, Inject 0.3 mg into the muscle as needed for anaphylaxis (for anaphylaxis). , Disp: , Rfl:    fluticasone (FLONASE) 50 MCG/ACT nasal spray, Place 1 spray into both nostrils daily., Disp: 16 g, Rfl: 0   insulin glargine (LANTUS SOLOSTAR) 100  UNIT/ML Solostar Pen, INJECT 56 UNITS INTO THE SKIN DAILY, Disp: 45 mL, Rfl: 3   Insulin Pen Needle 32G X 4 MM MISC, Use 1x a day, Disp: 100 each, Rfl: 3   metFORMIN (GLUCOPHAGE-XR) 500 MG 24 hr tablet, Take 4 tablets (2,000 mg total) by mouth daily with supper., Disp: 360 tablet, Rfl: 3   OneTouch Delica Lancets 40J MISC, Use 2x a day with OneTouch Verio Flex, Disp: 200 each, Rfl: 3   ONETOUCH VERIO test strip, USE 2X A DAY WITH ONETOUCH VERIO FLEX, Disp: 50 strip, Rfl: 15   rosuvastatin (CRESTOR) 20 MG tablet, Take 1 tablet (20 mg total) by mouth daily., Disp: 90 tablet, Rfl: 3   tirzepatide (MOUNJARO)  12.5 MG/0.5ML Pen, Inject 12.5 mg into the skin once a week., Disp: 6 mL, Rfl: 3   zonisamide (ZONEGRAN) 100 MG capsule, Take 400 mg by mouth at bedtime. , Disp: , Rfl:    methocarbamol (ROBAXIN) 500 MG tablet, Take 500 mg by mouth every 8 (eight) hours as needed. (Patient not taking: Reported on 12/22/2021), Disp: , Rfl:    tiZANidine (ZANAFLEX) 2 MG tablet, Take 2 mg by mouth at bedtime as needed for muscle spasms.  (Patient not taking: Reported on 12/22/2021), Disp: , Rfl:    Vitamin D, Ergocalciferol, (DRISDOL) 1.25 MG (50000 UNIT) CAPS capsule, Take 1 capsule (50,000 Units total) by mouth every Friday. (Patient not taking: Reported on 12/22/2021), Disp: 12 capsule, Rfl: 0  Allergies  Allergen Reactions   Peanuts [Peanut Oil] Anaphylaxis   Aspirin Other (See Comments)    Does not take because of her epilepsy/seizure     I personally reviewed active problem list, medication list, allergies, family history, social history, health maintenance with the patient/caregiver today.   ROS  Constitutional: Negative for fever, positive for weight change.  Respiratory: Negative for cough and shortness of breath.   Cardiovascular: Negative for chest pain or palpitations.  Gastrointestinal: Negative for abdominal pain, no bowel changes.  Musculoskeletal: Negative for gait problem or joint swelling.  Skin: Negative for rash.  Neurological: Negative for dizziness or headache.  No other specific complaints in a complete review of systems (except as listed in HPI above).   Objective  Vitals:   12/22/21 1300  BP: 128/78  Pulse: (!) 118  Resp: 16  SpO2: 97%  Weight: 295 lb (133.8 kg)  Height: 5' 7"  (1.702 m)    Body mass index is 46.2 kg/m.  Physical Exam  Constitutional: Patient appears well-developed and well-nourished. Obese  No distress.  HEENT: head atraumatic, normocephalic, pupils equal and reactive to ligh, neck supple Cardiovascular: Normal rate, regular rhythm and normal heart  sounds.  No murmur heard. No BLE edema. Pulmonary/Chest: Effort normal and breath sounds normal. No respiratory distress. Abdominal: Soft.  There is no tenderness. Muscular Skeletal: normal back and hip exam, pain during palpation of mid anterior thigh, reproducible, no bruising , redness or increase in warmth , distal pulses normal Psychiatric: Patient has a normal mood and affect. behavior is normal. Judgment and thought content normal.   Recent Results (from the past 2160 hour(s))  TSH     Status: None   Collection Time: 12/01/21  9:12 AM  Result Value Ref Range   TSH 1.05 0.35 - 5.50 uIU/mL  Lipid panel     Status: Abnormal   Collection Time: 12/01/21  9:12 AM  Result Value Ref Range   Cholesterol 210 (H) 0 - 200 mg/dL    Comment: ATP III  Classification       Desirable:  < 200 mg/dL               Borderline High:  200 - 239 mg/dL          High:  > = 240 mg/dL   Triglycerides 133.0 0.0 - 149.0 mg/dL    Comment: Normal:  <150 mg/dLBorderline High:  150 - 199 mg/dL   HDL 34.80 (L) >39.00 mg/dL   VLDL 26.6 0.0 - 40.0 mg/dL   LDL Cholesterol 148 (H) 0 - 99 mg/dL   Total CHOL/HDL Ratio 6     Comment:                Men          Women1/2 Average Risk     3.4          3.3Average Risk          5.0          4.42X Average Risk          9.6          7.13X Average Risk          15.0          11.0                       NonHDL 175.05     Comment: NOTE:  Non-HDL goal should be 30 mg/dL higher than patient's LDL goal (i.e. LDL goal of < 70 mg/dL, would have non-HDL goal of < 100 mg/dL)  Comprehensive metabolic panel     Status: Abnormal   Collection Time: 12/01/21  9:12 AM  Result Value Ref Range   Sodium 138 135 - 145 mEq/L   Potassium 4.1 3.5 - 5.1 mEq/L   Chloride 103 96 - 112 mEq/L   CO2 25 19 - 32 mEq/L   Glucose, Bld 146 (H) 70 - 99 mg/dL   BUN 12 6 - 23 mg/dL   Creatinine, Ser 0.85 0.40 - 1.20 mg/dL   Total Bilirubin 0.3 0.2 - 1.2 mg/dL   Alkaline Phosphatase 91 39 - 117 U/L   AST 17 0  - 37 U/L   ALT 22 0 - 35 U/L   Total Protein 7.8 6.0 - 8.3 g/dL   Albumin 4.3 3.5 - 5.2 g/dL   GFR 83.52 >60.00 mL/min    Comment: Calculated using the CKD-EPI Creatinine Equation (2021)   Calcium 9.7 8.4 - 10.5 mg/dL  Microalbumin / creatinine urine ratio     Status: Abnormal   Collection Time: 12/01/21  9:12 AM  Result Value Ref Range   Microalb, Ur 3.7 (H) 0.0 - 1.9 mg/dL   Creatinine,U 225.7 mg/dL   Microalb Creat Ratio 1.6 0.0 - 30.0 mg/g  POCT glycosylated hemoglobin (Hb A1C)     Status: Abnormal   Collection Time: 12/01/21  9:13 AM  Result Value Ref Range   Hemoglobin A1C 7.8 (A) 4.0 - 5.6 %   HbA1c POC (<> result, manual entry)     HbA1c, POC (prediabetic range)     HbA1c, POC (controlled diabetic range)      PHQ2/9: Depression screen [facility] 2/9 12/22/2021 11/29/2020 11/21/2020 08/15/2020 07/13/2020  Decreased Interest 0 0 0 0 0  Down, Depressed, Hopeless 0 0 0 0 0  PHQ - 2 Score 0 0 0 0 0  Altered sleeping 0 0 0 - -  Tired, decreased energy 0 3 0 - -  Change in appetite 0 3 0 - -  Feeling bad or failure about yourself  0 0 0 - -  Trouble concentrating 0 0 0 - -  Moving slowly or fidgety/restless 0 0 0 - -  Suicidal thoughts 0 0 0 - -  PHQ-9 Score 0 6 0 - -  Difficult doing work/chores - Not difficult at all Not difficult at all - -  Some recent data might be hidden    phq 9 is negative   Fall Risk: Fall Risk  12/22/2021 11/29/2020 11/21/2020 08/15/2020 07/13/2020  Falls in the past year? 1 0 0 1 1  Number falls in past yr: 0 0 0 1 1  Injury with Fall? 1 0 0 1 1  Comment - - - - -  Risk for fall due to : No Fall Risks - - History of fall(s) -  Follow up Falls prevention discussed - - - Falls evaluation completed      Functional Status Survey: Is the patient deaf or have difficulty hearing?: No Does the patient have difficulty seeing, even when wearing glasses/contacts?: No Does the patient have difficulty concentrating, remembering, or making decisions?: No Does  the patient have difficulty walking or climbing stairs?: No Does the patient have difficulty dressing or bathing?: No Does the patient have difficulty doing errands alone such as visiting a doctor's office or shopping?: No    Assessment & Plan  1. Anterior leg pain, right  - Ambulatory referral to Orthopedic Surgery - NCV with EMG(electromyography); Future  2. Need for Tdap vaccination  - Tdap (ADACEL) 03-06-14.5 LF-MCG/0.5 injection; Inject 0.5 mLs into the muscle once for 1 dose.  Dispense: 0.5 mL; Refill: 0  3. 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 Morbid obesity ([place])  Discussed with the patient the risk posed by an increased BMI. Discussed importance of portion control, calorie counting and at least 150 minutes of physical activity weekly. Avoid sweet beverages and drink more water. Eat at least 6 servings of fruit and vegetables daily    5. Central sleep apnea  She needs to resume CPAP  6. Dyslipidemia associated with type 2 diabetes mellitus ([place])  Doing better, under the care of Dr. Renne Crigler

## 2021-12-22 ENCOUNTER — Telehealth: Payer: Self-pay

## 2021-12-22 ENCOUNTER — Encounter: Payer: Self-pay | Admitting: Family Medicine

## 2021-12-22 ENCOUNTER — Other Ambulatory Visit: Payer: Self-pay

## 2021-12-22 ENCOUNTER — Ambulatory Visit (INDEPENDENT_AMBULATORY_CARE_PROVIDER_SITE_OTHER): Payer: 59 | Admitting: Family Medicine

## 2021-12-22 VITALS — BP 128/78 | HR 118 | Resp 16 | Ht 67.0 in | Wt 295.0 lb

## 2021-12-22 DIAGNOSIS — Z23 Encounter for immunization: Secondary | ICD-10-CM | POA: Diagnosis not present

## 2021-12-22 DIAGNOSIS — G40409 Other generalized epilepsy and epileptic syndromes, not intractable, without status epilepticus: Secondary | ICD-10-CM | POA: Diagnosis not present

## 2021-12-22 DIAGNOSIS — E1169 Type 2 diabetes mellitus with other specified complication: Secondary | ICD-10-CM

## 2021-12-22 DIAGNOSIS — M79604 Pain in right leg: Secondary | ICD-10-CM | POA: Diagnosis not present

## 2021-12-22 DIAGNOSIS — G4731 Primary central sleep apnea: Secondary | ICD-10-CM

## 2021-12-22 DIAGNOSIS — E785 Hyperlipidemia, unspecified: Secondary | ICD-10-CM

## 2021-12-22 MED ORDER — TETANUS-DIPHTH-ACELL PERTUSSIS 5-2-15.5 LF-MCG/0.5 IM SUSP: 0.5000 mL | Freq: Once | INTRAMUSCULAR | 0 refills | Status: AC

## 2021-12-22 NOTE — Telephone Encounter (Signed)
Left voicemail for Arbie Cookey at Neurology to scheduled a nerve conduction study during patients neurology appt on 12/28/21 per Dr. Steele Sizer. I left our callback number

## 2021-12-27 NOTE — Progress Notes (Signed)
GYNECOLOGY ANNUAL PHYSICAL EXAM PROGRESS NOTE  Subjective:    Erin Good is a 46 y.o. G24P0202 female who presents for an annual exam. The patient has no complaints today. The patient is sexually active. The patient participates in regular exercise: yes (sometimes, walking). Has the patient ever been transfused or tattooed?: no. The patient reports that there is not domestic violence in her life.    Repors that she started taking Mounjaro in October.  Has lost ~ 40 lbs. Since this time she has noticed that her cycles have become more regular again. Previously were very irregular. However now are very heavy with passage of clots and and mild to moderately painful.   Menstrual History: Menarche age: 54 Patient's last menstrual period was 12/20/2021 (exact date). Period Cycle (Days): 30 Period Duration (Days): 3-4 Period Pattern: Regular Menstrual Flow: Heavy (clotting) Menstrual Control: Maxi pad Menstrual Control Change Freq (Hours): 1-3 hours Dysmenorrhea: (!) Moderate (Takes Midol/ibuprofen) Dysmenorrhea Symptoms: Cramping   Gynecologic History:  Contraception: tubal ligation History of STI's: Denies Last Pap: 11/12/2019. Results were: normal. Notes h/o abnormal pap smears. Last mammogram: 11/09/2019. Results were: ASC-H. 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     OB History  Gravida Para Term Preterm AB Living  2 2 0 2 0 2  SAB IAB Ectopic Multiple Live Births  0 0 0 0 2    # Outcome Date GA Lbr Len/2nd Weight Sex Delivery Anes PTL Lv  2 Preterm 08/16/06    F CS-Unspec   LIV  1 Preterm 07/03/03    M CS-Unspec   LIV    Obstetric Comments  Menstrual age: 67    Age 1st Pregnancy: 14     Past Medical History:  Diagnosis Date   Allergic rhinitis    Breast discharge 06/05/2017   2 weeks ago left   Breast mass 12/06/2016   left   COVID-19 virus infection 11/2020   Diabetes mellitus without complication ([place])    Dyslipidemia     Epilepsy ([place])    Febrile seizures ([place])    Galactorrhea    Hx gestational diabetes    Hypertension    Kidney stones    Morbid obesity ([place])    Obesity    Seizures ([place])    Sleep apnea    Syncope and collapse    Tachycardia     Past Surgical History:  Procedure Laterality Date   BREAST BIOPSY Left 2018   benign   CESAREAN SECTION     X 2   IRRIGATION AND DEBRIDEMENT SHOULDER Left 10/12/2020   Procedure: IRRIGATION AND DEBRIDEMENT SHOULDER;  Surgeon: Hiram Gash, MD;  Location: [facility];  Service: Orthopedics;  Laterality: Left;   RIGHT OOPHORECTOMY Right 2001   benign tumor   TUBAL LIGATION  2007    Family History  Problem Relation Age of Onset   Diabetes Mother    Breast cancer Paternal Grandmother 41   Cancer Paternal Grandmother    Cancer Paternal Aunt    Cancer Maternal Grandmother    Heart disease Neg Hx     Social History   Socioeconomic History   Marital status: Married    Spouse name: Roderic Palau   Number of children: 2   Years of education: College   Highest education level: Not on file  Occupational History   Occupation: accountant  Tobacco Use   Smoking status: Never   Smokeless tobacco: Never  Vaping Use   Vaping Use: Never  used  Substance and Sexual Activity   Alcohol use: Yes    Comment: occass   Drug use: No   Sexual activity: Yes    Partners: Male    Birth control/protection: Surgical    Comment: tubial lig  Other Topics Concern   Not on file  Social History Narrative   Not on file   Social Determinants of Health   Financial Resource Strain: Not on file  Food Insecurity: Not on file  Transportation Needs: Not on file  Physical Activity: Not on file  Stress: Not on file  Social Connections: Not on file  Intimate Partner Violence: Not on file    Current Outpatient Medications on File Prior to Visit  Medication Sig Dispense Refill   Cholecalciferol (VITAMIN D) 50 MCG (2000 UT) CAPS Take 1 capsule by mouth daily at 12 noon.      clonazePAM (KLONOPIN) 0.5 MG tablet Take 0.5 mg by mouth daily as needed (Seizures).   3   EPINEPHrine 0.3 mg/0.3 mL IJ SOAJ injection Inject 0.3 mg into the muscle as needed for anaphylaxis (for anaphylaxis).      fluticasone (FLONASE) 50 MCG/ACT nasal spray Place 1 spray into both nostrils daily. 16 g 0   insulin glargine (LANTUS SOLOSTAR) 100 UNIT/ML Solostar Pen INJECT 56 UNITS INTO THE SKIN DAILY 45 mL 3   Insulin Pen Needle 32G X 4 MM MISC Use 1x a day 100 each 3   metFORMIN (GLUCOPHAGE-XR) 500 MG 24 hr tablet Take 4 tablets (2,000 mg total) by mouth daily with supper. 360 tablet 3   OneTouch Delica Lancets 74B MISC Use 2x a day with OneTouch Verio Flex 200 each 3   ONETOUCH VERIO test strip USE 2X A DAY WITH ONETOUCH VERIO FLEX 50 strip 15   rosuvastatin (CRESTOR) 20 MG tablet Take 1 tablet (20 mg total) by mouth daily. 90 tablet 3   tirzepatide (MOUNJARO) 12.5 MG/0.5ML Pen Inject 12.5 mg into the skin once a week. 6 mL 3   zonisamide (ZONEGRAN) 100 MG capsule Take 400 mg by mouth at bedtime.      No current facility-administered medications on file prior to visit.    Allergies  Allergen Reactions   Peanuts [Peanut Oil] Anaphylaxis   Aspirin Other (See Comments)    Does not take because of her epilepsy/seizure      Review of Systems Constitutional: negative for chills, fatigue, fevers and sweats. Positive for weight gain (intentional).  Eyes: negative for irritation, redness and visual disturbance Ears, nose, mouth, throat, and face: negative for hearing loss, nasal congestion, snoring and tinnitus Respiratory: negative for asthma, cough, sputum Cardiovascular: negative for chest pain, dyspnea, exertional chest pressure/discomfort, irregular heart beat, palpitations and syncope Gastrointestinal: negative for abdominal pain, change in bowel habits, nausea and vomiting Genitourinary: positive for abnormal menstrual periods (see HPI).  Negative genital lesions, sexual problems and  vaginal discharge, dysuria and urinary incontinence Integument/breast: negative for breast lump, breast tenderness and nipple discharge Hematologic/lymphatic: negative for bleeding and easy bruising Musculoskeletal:negative for back pain and muscle weakness Neurological: negative for dizziness, headaches, vertigo and weakness Endocrine: negative for diabetic symptoms including polydipsia, polyuria and skin dryness Allergic/Immunologic: negative for hay fever and urticaria      Objective:  Blood pressure 129/84, pulse (!) 106, height 5' 7"  (1.702 m), weight 295 lb 11.2 oz (134.1 kg), last menstrual period 12/20/2021. Body mass index is 46.31 kg/m.    General Appearance:    Alert, cooperative, no distress, appears stated age,  morbid obesity  Head:    Normocephalic, without obvious abnormality, atraumatic  Eyes:    PERRL, conjunctiva/corneas clear, EOM's intact, both eyes  Ears:    Normal external ear canals, both ears  Nose:   Nares normal, septum midline, mucosa normal, no drainage or sinus tenderness  Throat:   Lips, mucosa, and tongue normal; teeth and gums normal  Neck:   Supple, symmetrical, trachea midline, no adenopathy; thyroid: no enlargement/tenderness/nodules; no carotid bruit or JVD  Back:     Symmetric, no curvature, ROM normal, no CVA tenderness  Lungs:     Clear to auscultation bilaterally, respirations unlabored  Chest Wall:    No tenderness or deformity   Heart:    Regular rate and rhythm, S1 and S2 normal, no murmur, rub or gallop  Breast Exam:    No tenderness, masses, or nipple abnormality  Abdomen:     Soft, non-tender, bowel sounds active all four quadrants, no masses, no organomegaly.    Genitalia:    Pelvic:external genitalia normal, vagina without lesions, discharge, or tenderness, rectovaginal septum  normal. Cervix normal in appearance, no cervical motion tenderness, no adnexal masses or tenderness.  Uterus normal size, shape, mobile, regular contours, nontender.   Rectal:    Normal external sphincter.  No hemorrhoids appreciated. Internal exam not done.   Extremities:   Extremities normal, atraumatic, no cyanosis or edema  Pulses:   2+ and symmetric all extremities  Skin:   Skin color, texture, turgor normal, no rashes or lesions  Lymph nodes:   Cervical, supraclavicular, and axillary nodes normal  Neurologic:   CNII-XII intact, normal strength, sensation and reflexes throughout   .  Labs:  Lab Results  Component Value Date   WBC 17.1 (H) 10/03/2020   HGB 12.7 10/03/2020   HCT 38.3 10/03/2020   MCV 79.8 (L) 10/03/2020   PLT 330 10/03/2020    Lab Results  Component Value Date   CREATININE 0.85 12/01/2021   BUN 12 12/01/2021   NA 138 12/01/2021   K 4.1 12/01/2021   CL 103 12/01/2021   CO2 25 12/01/2021    Lab Results  Component Value Date   ALT 22 12/01/2021   AST 17 12/01/2021   ALKPHOS 91 12/01/2021   BILITOT 0.3 12/01/2021    Lab Results  Component Value Date   TSH 1.05 12/01/2021    Lab Results  Component Value Date   HGBA1C 7.8 (A) 12/01/2021    Assessment:   1. Encounter for well woman exam with routine gynecological exam   2. Encounter for screening mammogram for malignant neoplasm of breast   3. Screening for deficiency anemia   4. History of abnormal cervical Pap smear   5. Menorrhagia with regular cycle   6. Morbid obesity ([place])   7. Diabetes mellitus without complication ([place])      Plan:  - Blood tests: None ordered. Has labs performed by PCP. - Breast self exam technique reviewed and patient encouraged to perform self-exam monthly. - Contraception: tubal ligation. - Discussed healthy lifestyle modifications.  Doing well with weight loss with Mounjaro.  - Mammogram ordered.  - Pap smear  performed due to h/o abnormal pap, CIN I noted on last colposcopy  . - Menorrhagia, new onset with recent weight loss. Discussed treatment options (TXA, hormonal supplementation such as IUD), and surgical options such  as endometrial ablation. Patient at higher risk to undergo definitive management with hysterectomy due to uncontrolled DM and obesity. Patient willing to try TXA. Will  prescribe.  - Diabetes managed by PCP, currently on Mounjaro, A1c decreased from 9.7 in October to 7.8 recently. - COVID vaccination status: up to date - Follow up in 1 year for annual exam   [name], MD [facility]

## 2021-12-27 NOTE — Patient Instructions (Addendum)

## 2021-12-29 ENCOUNTER — Other Ambulatory Visit (HOSPITAL_COMMUNITY)
Admission: RE | Admit: 2021-12-29 | Discharge: 2021-12-29 | Disposition: A | Discharge status: Home / Self Care | Payer: 59 | Source: Ambulatory Visit | Attending: Obstetrics and Gynecology | Admitting: Obstetrics and Gynecology

## 2021-12-29 ENCOUNTER — Other Ambulatory Visit: Payer: Self-pay

## 2021-12-29 ENCOUNTER — Encounter: Payer: Self-pay | Admitting: Obstetrics and Gynecology

## 2021-12-29 ENCOUNTER — Ambulatory Visit (INDEPENDENT_AMBULATORY_CARE_PROVIDER_SITE_OTHER): Payer: 59 | Admitting: Obstetrics and Gynecology

## 2021-12-29 VITALS — BP 129/84 | HR 106 | Ht 67.0 in | Wt 295.7 lb

## 2021-12-29 DIAGNOSIS — Z1231 Encounter for screening mammogram for malignant neoplasm of breast: Secondary | ICD-10-CM

## 2021-12-29 DIAGNOSIS — N920 Excessive and frequent menstruation with regular cycle: Secondary | ICD-10-CM

## 2021-12-29 DIAGNOSIS — Z130 Encounter for screening for diseases of the blood and blood-forming organs and certain disorders involving the immune mechanism: Secondary | ICD-10-CM | POA: Diagnosis not present

## 2021-12-29 DIAGNOSIS — E119 Type 2 diabetes mellitus without complications: Secondary | ICD-10-CM

## 2021-12-29 DIAGNOSIS — Z8742 Personal history of other diseases of the female genital tract: Secondary | ICD-10-CM | POA: Insufficient documentation

## 2021-12-29 DIAGNOSIS — Z01419 Encounter for gynecological examination (general) (routine) without abnormal findings: Secondary | ICD-10-CM | POA: Diagnosis not present

## 2021-12-29 MED ORDER — TRANEXAMIC ACID 650 MG PO TABS: 1300.0000 mg | ORAL_TABLET | Freq: Three times a day (TID) | ORAL | 2 refills | Status: DC

## 2022-01-01 ENCOUNTER — Ambulatory Visit: Payer: 59 | Admitting: Physician Assistant

## 2022-01-04 LAB — CYTOLOGY - PAP: Diagnosis: NEGATIVE

## 2022-01-27 ENCOUNTER — Ambulatory Visit
Admission: RE | Admit: 2022-01-27 | Discharge: 2022-01-27 | Disposition: A | Discharge status: Home / Self Care | Payer: 59 | Source: Ambulatory Visit | Attending: Emergency Medicine | Admitting: Emergency Medicine

## 2022-01-27 ENCOUNTER — Other Ambulatory Visit: Payer: Self-pay

## 2022-01-27 VITALS — BP 146/85 | HR 104 | Temp 98.2°F | Resp 18

## 2022-01-27 DIAGNOSIS — H1033 Unspecified acute conjunctivitis, bilateral: Secondary | ICD-10-CM | POA: Diagnosis not present

## 2022-01-27 MED ORDER — POLYMYXIN B-TRIMETHOPRIM 10000-0.1 UNIT/ML-% OP SOLN: 1.0000 [drp] | Freq: Four times a day (QID) | OPHTHALMIC | 0 refills | Status: AC

## 2022-01-27 NOTE — Discharge Instructions (Addendum)
Use the eye drops as directed.  Follow up with your primary care provider if your symptoms are not improving.   ? ?

## 2022-01-27 NOTE — ED Triage Notes (Signed)
Pt here with left eye drainage and swelling after going to the eye doctor yesterday.  ?

## 2022-01-27 NOTE — ED Provider Notes (Signed)
?[facility] ? ? ? ?CSN: [id number] ?Arrival date & time: 01/27/22  1244 ? ? ?  ? ?History   ?Chief Complaint ?Chief Complaint  ?Patient presents with  ? Conjunctivitis  ?  Left eye pain crusty. Lid swollen - Entered by patient  ? ? ?HPI ?Erin Good is a 46 y.o. female.  Patient presents with left eye redness, crusting in her lashes, yellow-green mucus since this morning.  She was seen by her eye care provider yesterday for her yearly exam but did not have her symptoms at that time.  She states this is similar to previous episode of pinkeye and is concerned that she may have gotten it at the eye doctor office.  No eye injury, acute eye pain, changes in vision.  No fever, chills, earache, sore throat, cough, shortness of breath, or other symptoms.  No treatments at home.  Her medical history includes diabetes, seizures, GERD, dysmetabolic syndrome, morbid obesity. ? ?The history is provided by the patient and medical records.  ? ?Past Medical History:  ?Diagnosis Date  ? Allergic rhinitis   ? Breast discharge 06/05/2017  ? 2 weeks ago left  ? Breast mass 12/06/2016  ? left  ? COVID-19 virus infection 11/2020  ? Diabetes mellitus without complication ([place])   ? Dyslipidemia   ? Epilepsy ([place])   ? Febrile seizures ([place])   ? Galactorrhea   ? Hx gestational diabetes   ? Hypertension   ? Kidney stones   ? Morbid obesity ([place])   ? Obesity   ? Seizures ([place])   ? Sleep apnea   ? Syncope and collapse   ? Tachycardia   ? ? ?Patient Active Problem List  ? Diagnosis Date Noted  ? Diabetes mellitus without complication ([place])   ? Morbid obesity ([place])   ? Hypertension   ? COVID-19 virus infection 11/2020  ? Impingement syndrome of left shoulder region 04/20/2020  ? Vitamin D deficiency 01/31/2017  ? Right shoulder tendinitis 01/31/2017  ? Tendinosis 01/31/2017  ? Bell's palsy 12/19/2016  ? Tachycardia 09/30/2016  ? Depression with anxiety 05/06/2015  ? Allergic rhinitis 04/20/2015  ? Anxiety and depression  04/20/2015  ? 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 Gastro-esophageal reflux disease without esophagitis 04/20/2015  ? Dysmetabolic syndrome 25/42/7062  ? NASH (nonalcoholic steatohepatitis) 04/20/2015  ? Allergy to nuts 04/20/2015  ? Calculus of kidney 04/20/2015  ? 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 Central sleep apnea 11/26/2008  ? Dyslipidemia 07/02/2008  ? Leukocytosis 07/29/2007  ? ? ?Past Surgical History:  ?Procedure Laterality Date  ? BREAST BIOPSY Left 2018  ? benign  ? CESAREAN SECTION    ? X 2  ? IRRIGATION AND DEBRIDEMENT SHOULDER Left 10/12/2020  ? Procedure: IRRIGATION AND DEBRIDEMENT SHOULDER;  Surgeon: Hiram Gash, MD;  Location: [facility];  Service: Orthopedics;  Laterality: Left;  ? RIGHT OOPHORECTOMY Right 2001  ? benign tumor  ? TUBAL LIGATION  2007  ? ? ?OB History   ? ? Gravida  ?2  ? Para  ?2  ? Term  ?   ? Preterm  ?2  ? AB  ?   ? Living  ?2  ?  ? ? SAB  ?   ? IAB  ?   ? Ectopic  ?   ? Multiple  ?   ? Live Births  ?2  ?   ?  ? Obstetric Comments  ?Menstrual age: 71 ? ?Age  1st Pregnancy: 27  ?  ?  ? ?  ? ? ? ?Home Medications   ? ?Prior to Admission medications   ?Medication Sig Start Date End Date Taking? Authorizing Provider  ?trimethoprim-polymyxin b (POLYTRIM) ophthalmic solution Place 1 drop into both eyes 4 (four) times daily for 7 days. 01/27/22 02/03/22 Yes Sharion Balloon, NP  ?Cholecalciferol (VITAMIN D) 50 MCG (2000 UT) CAPS Take 1 capsule by mouth daily at 12 noon.    [provider]  ?clonazePAM (KLONOPIN) 0.5 MG tablet Take 0.5 mg by mouth daily as needed (Seizures).  08/14/16   [provider]  ?EPINEPHrine 0.3 mg/0.3 mL IJ SOAJ injection Inject 0.3 mg into the muscle as needed for anaphylaxis (for anaphylaxis).  06/14/14   [provider]  ?fluticasone (FLONASE) 50 MCG/ACT nasal spray Place 1 spray into both nostrils daily. 11/19/20   Hall-Potvin, [place], PA-C  ?insulin glargine (LANTUS SOLOSTAR) 100 UNIT/ML  Solostar Pen INJECT 56 UNITS INTO THE SKIN DAILY 08/25/21   Philemon Kingdom, MD  ?Insulin Pen Needle 32G X 4 MM MISC Use 1x a day 07/03/19   Philemon Kingdom, MD  ?metFORMIN (GLUCOPHAGE-XR) 500 MG 24 hr tablet Take 4 tablets (2,000 mg total) by mouth daily with supper. 08/25/21   Philemon Kingdom, MD  ?Jonetta Speak Lancets 40J MISC Use 2x a day with OneTouch Verio Flex 07/03/19   Philemon Kingdom, MD  ?[facility] VERIO test strip USE 2X A DAY WITH ONETOUCH VERIO FLEX 10/02/21   Philemon Kingdom, MD  ?rosuvastatin (CRESTOR) 20 MG tablet Take 1 tablet (20 mg total) by mouth daily. 10/31/20   Philemon Kingdom, MD  ?tirzepatide [facility]) 12.5 MG/0.5ML Pen Inject 12.5 mg into the skin once a week. 12/01/21   Philemon Kingdom, MD  ?tranexamic acid (LYSTEDA) 650 MG TABS tablet Take 2 tablets (1,300 mg total) by mouth 3 (three) times daily. Take during menses for a maximum of five days 12/29/21   [name], MD  ?zonisamide (ZONEGRAN) 100 MG capsule Take 400 mg by mouth at bedtime.     Roque Cash, MD  ? ? ?Family History ?Family History  ?Problem Relation Age of Onset  ? Diabetes Mother   ? Breast cancer Paternal Grandmother 65  ? Cancer Paternal Grandmother   ? Cancer Paternal Aunt   ? Cancer Maternal Grandmother   ? Heart disease Neg Hx   ? ? ?Social History ?Social History  ? ?Tobacco Use  ? Smoking status: Never  ? Smokeless tobacco: Never  ?Vaping Use  ? Vaping Use: Never used  ?Substance Use Topics  ? Alcohol use: Yes  ?  Comment: occass  ? Drug use: No  ? ? ? ?Allergies   ?Peanuts [peanut oil] and Aspirin ? ? ?Review of Systems ?Review of Systems  ?Constitutional:  Negative for chills and fever.  ?HENT:  Negative for ear pain and sore throat.   ?Eyes:  Positive for discharge, redness and itching. Negative for pain and visual disturbance.  ?Respiratory:  Negative for cough and shortness of breath.   ?Skin:  Negative for color change and rash.  ?All other systems reviewed and are negative. ? ? ?Physical  Exam ?Triage Vital Signs ?ED Triage Vitals  ?Enc Vitals Group  ?   BP   ?   Pulse   ?   Resp   ?   Temp   ?   Temp src   ?   SpO2   ?   Weight   ?   Height   ?  Head Circumference   ?   Peak Flow   ?   Pain Score   ?   Pain Loc   ?   Pain Edu?   ?   Excl. in [place]?   ? ?No data found. ? ?Updated Vital Signs ?BP (!) 146/85   Pulse (!) 104   Temp 98.2 ?F (36.8 ?C)   Resp 18   SpO2 96%  ? ?Visual Acuity ?Right Eye Distance: 20/16 ?Left Eye Distance: 20/30 ?Bilateral Distance: 20/16 ? ?Right Eye Near:   ?Left Eye Near:    ?Bilateral Near:    ? ?Physical Exam ?Vitals and nursing note reviewed.  ?Constitutional:   ?   General: She is not in acute distress. ?   Appearance: She is well-developed. She is obese. She is not ill-appearing.  ?HENT:  ?   Right Ear: Tympanic membrane normal.  ?   Left Ear: Tympanic membrane normal.  ?   Nose: Nose normal.  ?   Mouth/Throat:  ?   Mouth: Mucous membranes are moist.  ?   Pharynx: Oropharynx is clear.  ?Eyes:  ?   General: Lids are normal. Vision grossly intact.  ?   Extraocular Movements: Extraocular movements intact.  ?   Conjunctiva/sclera:  ?   Right eye: Right conjunctiva is injected.  ?   Left eye: Left conjunctiva is injected.  ?   Pupils: Pupils are equal, round, and reactive to light.  ?   Comments: Yellow-green crusted mucous in left lashes.   ?Cardiovascular:  ?   Rate and Rhythm: Normal rate and regular rhythm.  ?   Heart sounds: No murmur heard. ?Pulmonary:  ?   Effort: Pulmonary effort is normal. No respiratory distress.  ?   Breath sounds: Normal breath sounds.  ?Musculoskeletal:  ?   Cervical back: Neck supple.  ?Skin: ?   General: Skin is warm and dry.  ?Neurological:  ?   Mental Status: She is alert.  ?Psychiatric:     ?   Mood and Affect: Mood normal.     ?   Behavior: Behavior normal.  ? ? ? ?UC Treatments / Results  ?Labs ?(all labs ordered are listed, but only abnormal results are displayed) ?Labs Reviewed - No data to display ? ?EKG ? ? ?Radiology ?No results  found. ? ?Procedures ?Procedures (including critical care time) ? ?Medications Ordered in UC ?Medications - No data to display ? ?Initial Impression / Assessment and Plan / UC Course  ?I have reviewed the triage vital

## 2022-02-02 ENCOUNTER — Encounter: Payer: Self-pay | Admitting: Internal Medicine

## 2022-02-02 DIAGNOSIS — E11319 Type 2 diabetes mellitus with unspecified diabetic retinopathy without macular edema: Secondary | ICD-10-CM

## 2022-02-05 MED ORDER — TIRZEPATIDE 12.5 MG/0.5ML ~~LOC~~ SOAJ: 12.5000 mg | SUBCUTANEOUS | 1 refills | Status: DC

## 2022-03-20 ENCOUNTER — Encounter: Payer: Self-pay | Admitting: Internal Medicine

## 2022-03-20 DIAGNOSIS — E11319 Type 2 diabetes mellitus with unspecified diabetic retinopathy without macular edema: Secondary | ICD-10-CM

## 2022-03-22 MED ORDER — MOUNJARO 15 MG/0.5ML ~~LOC~~ SOAJ: 15.0000 mg | SUBCUTANEOUS | 1 refills | Status: DC

## 2022-04-04 ENCOUNTER — Ambulatory Visit (INDEPENDENT_AMBULATORY_CARE_PROVIDER_SITE_OTHER): Payer: 59 | Admitting: Internal Medicine

## 2022-04-04 ENCOUNTER — Encounter: Payer: Self-pay | Admitting: Internal Medicine

## 2022-04-04 VITALS — BP 126/78 | HR 100 | Ht 67.0 in | Wt 283.0 lb

## 2022-04-04 DIAGNOSIS — E785 Hyperlipidemia, unspecified: Secondary | ICD-10-CM | POA: Diagnosis not present

## 2022-04-04 DIAGNOSIS — Z794 Long term (current) use of insulin: Secondary | ICD-10-CM

## 2022-04-04 DIAGNOSIS — R809 Proteinuria, unspecified: Secondary | ICD-10-CM | POA: Diagnosis not present

## 2022-04-04 DIAGNOSIS — E042 Nontoxic multinodular goiter: Secondary | ICD-10-CM | POA: Diagnosis not present

## 2022-04-04 DIAGNOSIS — E1129 Type 2 diabetes mellitus with other diabetic kidney complication: Secondary | ICD-10-CM | POA: Diagnosis not present

## 2022-04-04 LAB — POCT GLUCOSE (DEVICE FOR HOME USE): Glucose Fasting, POC: 157 mg/dL — AB (ref 70–99)

## 2022-04-04 LAB — POCT GLYCOSYLATED HEMOGLOBIN (HGB A1C): Hemoglobin A1C: 6.5 % — AB (ref 4.0–5.6)

## 2022-04-04 NOTE — Progress Notes (Signed)
Patient ID: Erin Good, female   DOB: June 02, 1976, 46 y.o.   MRN: [id number]   This visit occurred during the SARS-CoV-2 public health emergency.  Safety protocols were in place, including screening questions prior to the visit, additional usage of staff PPE, and extensive cleaning of exam room while observing appropriate contact time as indicated for disinfecting solutions.   HPI: Erin Good is a 46 y.o.-year-old female, initially referred by her PCP, Dr. Ancil Boozer, presenting for follow-up for DM2, dx in ~2015, insulin-dependent since 2019, uncontrolled, with complications (diabetic retinopathy, microalbuminuria) and also for thyroid nodules.  Last visit  4 months ago.  Interim history: No increased urination, blurry vision, nausea, chest pain. She lost ~20 lbs before last visit.  Since then, she lost 18 more pounds. She had problems with getting Mounjaro at the 12.5 mg dose and she was out the medication for 1 month.  She is currently at 15 mg weekly.  She feels that this is a stronger dose, but she does not have GI symptoms with it.  Reviewed HbA1c levels: Lab Results  Component Value Date   HGBA1C 7.8 (A) 12/01/2021   HGBA1C 9.7 (A) 09/01/2021   HGBA1C 7.6 (H) 10/03/2020   HGBA1C 7.9 (A) 08/15/2020   HGBA1C 9.0 (H) 06/30/2020   HGBA1C 8.3 (A) 05/30/2020   HGBA1C 8.0 (A) 11/12/2019   HGBA1C 10.9 (H) 05/25/2019   HGBA1C 10.0 (A) 01/29/2019   HGBA1C 10.4 (A) 04/01/2018   Pt was on a regimen of: - Metformin ER 750 mg 2x a day, with meals - Soliqua (Lantus + Lixisenatide) 50 units daily in am  Currently on: - Metformin ER 750 mg 2x a day with meals >> 1500 >> 2000 mg with dinner - Lantus 50 >> 56 units at bedtime - Ozempic 1 mg weekly >> Mounjaro 7.5 >> 10 >> 12.5 >> off for 1 mo >> 15 mg weekly - started 2 weeks ago [facility] 06/2020 >> not covered.  She checks her sugars 1-2 times a day-no log, no meter: - am: 220-330, 393 >> 140-160, 170 >> 120-155 >>  130-206 >> 110, 120-130s, 160 - 2h after b'fast: n/c >> 85-130 >> n/c >> 166, 207 >> n/c - before lunch: n/c >> 92-180s >> n/c >> 120-140 - 2h after lunch:  140-180 >> 160s-210 >> 125-153 >> n/c - before dinner: 150-201 >> 140s  >> n/c >> 178-198 >> 70s, 120-140 - 2h after dinner: n/c - bedtime: n/c >> 150s >> n/c >> 170-330 >> N/c - nighttime: n/c Lowest sugar was98 >> 92 >> 80 >> 120 >> 82 >> 70s; she has hypoglycemia awareness at 100. Highest sugar was 393 >> 170 >> 330 >> 206 >> 160.  Glucometer: [employer] advance  Pt's meals are: - Breakfast: 2 eggs + 3 pieces of bacon >> 2 eggs - snack: fruit - Lunch: salad - Dinner:meat + veggie + starch (rice + pasta) - Snacks: 1  Reducing sodas: Coke >> sparkling water. I did advise her to stop juice in the past >> off now.   -No CKD, last BUN/creatinine:  Lab Results  Component Value Date   BUN 12 12/01/2021   BUN 10 10/03/2020   CREATININE 0.85 12/01/2021   CREATININE 0.61 10/03/2020  Not on ACE inhibitor/ARB.  ACR levels are high: Lab Results  Component Value Date   MICRALBCREAT 1.6 12/01/2021   MICRALBCREAT 3.9 10/27/2020   MICRALBCREAT 33 (H) 11/12/2019   MICRALBCREAT 30 (H) 01/29/2019   -+ HL; last  set of lipids: Lab Results  Component Value Date   CHOL 210 (H) 12/01/2021   HDL 34.80 (L) 12/01/2021   LDLCALC 148 (H) 12/01/2021   TRIG 133.0 12/01/2021   CHOLHDL 6 12/01/2021  At last visit, she was on Crestor 10 mg daily and I advised her to increase the dose to 20 mg daily.She takes this now.  - last eye exam was in 2023: ? DR (need to get the report)  - no numbness and tingling in her feet.  Occasional numbness in the right leg and foot.  Pt has FH of DM in mother, MGF.  Thyroid nodules:  Reviewed previous work-up: 06/01/2019: Thyroid ultrasound: Parenchymal Echotexture: Mildly heterogenous Isthmus: 0.4 cm thickness Right lobe: 5 x 2.4 x 2.8 cm Left lobe: 4.5 x 2 x 2.1 cm    Estimated total number of nodules >/=  1 cm: 4   Nodule # 1: Location: Right; Mid Maximum size: 1.6 cm; Other 2 dimensions: 1.5 x 1.4 cm Composition: mixed cystic and solid (1) Echogenicity: hypoechoic (2) *Given size (>/= 1.5 - 2.4 cm) and appearance, a follow-up ultrasound in 1 year should be considered based on TI-RADS criteria. _________________________________________________________   Nodule # 2: Location: Right; Inferior Maximum size: 2.3 cm; Other 2 dimensions: 1.9 x 1.9 cm Composition: solid/almost completely solid (2) Echogenicity: hypoechoic (2)  **Given size (>/= 1.5 cm) and appearance, fine needle aspiration of this moderately suspicious nodule should be considered based on TI-RADS criteria. _________________________________________________   Nodule # 3: Location: Left; Inferior Maximum size: 1.4 cm; Other 2 dimensions: 1.2 x 1.2 cm Composition: solid/almost completely solid (2) Echogenicity: isoechoic (1)  Given size (<1.4 cm) and appearance, this nodule does NOT meet TI-RADS criteria for biopsy or dedicated follow-up. _________________________________________________________   Nodule # 4: Location: Left; Mid Maximum size: 1.8 cm; Other 2 dimensions: 1.6 x 1.3 cm Composition: solid/almost completely solid (2) Echogenicity: hypoechoic (2) **Given size (>/= 1.5 cm) and appearance, fine needle aspiration of this moderately suspicious nodule should be considered based on TI-RADS criteria.   IMPRESSION: 1. Thyromegaly with bilateral nodules. 2. Recommend FNA biopsy of moderately suspicious 1.8 cm mid left AND 2.3 cm inferior right nodules. 3. Recommend annual/biennial ultrasound follow-up of additional nodules as above, until stability x5 years confirmed.  08/05/2019: FNA of the 2 nodules: Clinical History: Right inferior 2.3cm; Other 2 dimensions: 1.9 x 1.9cm,  Solid / almost completely solid, Hypoechoic, TI-RADS total points 4  Specimen Submitted:  A. THYROID, RLP, FINE NEEDLE ASPIRATION:   DIAGNOSIS:  - Consistent with benign follicular nodule (Bethesda category II)  SPECIMEN ADEQUACY:  Satisfactory for evaluation   Clinical History: Left mid 1.8cm; Other 2 dimensions: 1.6 x 1.3cm, Solid  / almost completely solid, Hypoechoic, TI-RADS total points 4  Specimen Submitted:  A. THYROID, LMP, FINE NEEDLE  ASPIRATION:  DIAGNOSIS:  - Consistent with benign follicular nodule (Bethesda category II)  SPECIMEN ADEQUACY:  Satisfactory for evaluation  Thyroid U/S (09/18/2021): Parenchymal Echotexture: Mildly heterogenous Isthmus: 0.5 cm, previously 0.4 cm Right lobe: 6.1 x 2.2 x 2.8 cm, previously 5.0 x 2.4 x 2.8 cm Left lobe: 5.3 x 2.3 x 2.4 cm, previously 4.5 x 2.0 x 2.1 cm _________________________________________________________   Estimated total number of nodules >/= 1 cm: 5 _________________________________________________________   Nodule # 1: Location: Right; Superior Maximum size: 2.0 cm; Other 2 dimensions: 1.7 x 1.7 cm, previously 1.6 x 1.5 x 1.4 cm Composition: mixed cystic and solid (1) Echogenicity: isoechoic (1) This nodule does NOT meet  TI-RADS criteria for biopsy or dedicated follow-up. _________________________________________________________   Nodule # 2: Location: Right; Mid Maximum size: 2.9 cm; Other 2 dimensions: 2.2 x 1.7 cm, previously 2.3 x 1.9 x 1.9 cm This nodule is mixed cystic and solid, and was previously biopsied.  _________________________________________________________   Nodule # 3: Location: Right; Inferior Maximum size: 1.5 cm; Other 2 dimensions: 1.4 x 0.9 cm Composition: solid/almost completely solid (2) Echogenicity: isoechoic (1) *Given size (>/= 1.5 - 2.4 cm) and appearance, a follow-up ultrasound in 1 year should be considered based on TI-RADS criteria. _________________________________________________________   Nodule # 4: Location: Left; Mid Maximum size: 2.5 cm; Other 2 dimensions: 2.0 x 1.5 cm, previously 1.8 x 1.6 x  1.2 cm This nodule is mixed cystic and solid, and was previously biopsied. _________________________________________________________   Nodule # 5: Location: Left; Inferior Maximum size: 1.5 cm; Other 2 dimensions: 1.2 x 1.0 cm, previously 1.4 x 1.2 x 1.2 cm Composition: solid/almost completely solid (2) Echogenicity: isoechoic (1) *Given size (>/= 1.5 - 2.4 cm) and appearance, a follow-up ultrasound in 1 year should be considered based on TI-RADS criteria.  _________________________________________________________   No cervical lymphadenopathy, within the imaged neck   IMPRESSION: 1. Multinodular thyroid gland, with similar appearance of previously-biopsied nodules as above. 2. 1.5 cm bilateral TR 3 nodules. Attention on follow-up.  *Given size (>/= 1.5 - 2.4 cm) and appearance, a follow-up ultrasound in 1 year should be considered based on TI-RADS criteria.  Reviewed her TFTs: Lab Results  Component Value Date   TSH 1.05 12/01/2021   TSH 1.74 10/27/2020   TSH 1.35 05/25/2019   TSH 1.070 07/28/2015   TSH 1.13 10/15/2014   Pt denies: - feeling nodules in neck - hoarseness - dysphagia - choking - SOB with lying down  She also has a history of PCOS and distant history of epilepsy at 46 years old.  ROS: Signs see HPI  I reviewed pt's medications, allergies, PMH, social hx, family hx, and changes were documented in the history of present illness. Otherwise, unchanged from my initial visit note.  Past Medical History:  Diagnosis Date   Allergic rhinitis    Breast discharge 06/05/2017   2 weeks ago left   Breast mass 12/06/2016   left   COVID-19 virus infection 11/2020   Diabetes mellitus without complication ([place])    Dyslipidemia    Epilepsy ([place])    Febrile seizures ([place])    Galactorrhea    Hx gestational diabetes    Hypertension    Kidney stones    Morbid obesity ([place])    Obesity    Seizures ([place])    Sleep apnea    Syncope and collapse    Tachycardia     Past Surgical History:  Procedure Laterality Date   BREAST BIOPSY Left 2018   benign   CESAREAN SECTION     X 2   IRRIGATION AND DEBRIDEMENT SHOULDER Left 10/12/2020   Procedure: IRRIGATION AND DEBRIDEMENT SHOULDER;  Surgeon: Hiram Gash, MD;  Location: [facility];  Service: Orthopedics;  Laterality: Left;   RIGHT OOPHORECTOMY Right 2001   benign tumor   TUBAL LIGATION  2007   Social History   Socioeconomic History   Marital status: Married    Spouse name: Roderic Palau   Number of children: 2   Years of education: College   Highest education level: Not on file  Occupational History   Occupation: accountant  Tobacco Use   Smoking status: Never   Smokeless tobacco: Never  Vaping Use   Vaping Use: Never used  Substance and Sexual Activity   Alcohol use: Yes    Comment: occass   Drug use: No   Sexual activity: Yes    Partners: Male    Birth control/protection: Surgical    Comment: tubial lig  Other Topics Concern   Not on file  Social History Narrative   Not on file   Social Determinants of Health   Financial Resource Strain: Not on file  Food Insecurity: Not on file  Transportation Needs: Not on file  Physical Activity: Not on file  Stress: Not on file  Social Connections: Not on file  Intimate Partner Violence: Not on file   Meds: Current Outpatient Medications on File Prior to Visit  Medication Sig Dispense Refill   Cholecalciferol (VITAMIN D) 50 MCG (2000 UT) CAPS Take 1 capsule by mouth daily at 12 noon.     clonazePAM (KLONOPIN) 0.5 MG tablet Take 0.5 mg by mouth daily as needed (Seizures).   3   EPINEPHrine 0.3 mg/0.3 mL IJ SOAJ injection Inject 0.3 mg into the muscle as needed for anaphylaxis (for anaphylaxis).      fluticasone (FLONASE) 50 MCG/ACT nasal spray Place 1 spray into both nostrils daily. 16 g 0   insulin glargine (LANTUS SOLOSTAR) 100 UNIT/ML Solostar Pen INJECT 56 UNITS INTO THE SKIN DAILY 45 mL 3   Insulin Pen Needle 32G X 4 MM MISC Use 1x a  day 100 each 3   metFORMIN (GLUCOPHAGE-XR) 500 MG 24 hr tablet Take 4 tablets (2,000 mg total) by mouth daily with supper. 360 tablet 3   OneTouch Delica Lancets 08M MISC Use 2x a day with OneTouch Verio Flex 200 each 3   ONETOUCH VERIO test strip USE 2X A DAY WITH ONETOUCH VERIO FLEX 50 strip 15   rosuvastatin (CRESTOR) 20 MG tablet Take 1 tablet (20 mg total) by mouth daily. 90 tablet 3   tirzepatide (MOUNJARO) 12.5 MG/0.5ML Pen Inject 12.5 mg into the skin once a week. 6 mL 1   tirzepatide (MOUNJARO) 15 MG/0.5ML Pen Inject 15 mg into the skin once a week. 2 mL 1   tranexamic acid (LYSTEDA) 650 MG TABS tablet Take 2 tablets (1,300 mg total) by mouth 3 (three) times daily. Take during menses for a maximum of five days 30 tablet 2   zonisamide (ZONEGRAN) 100 MG capsule Take 400 mg by mouth at bedtime.      No current facility-administered medications on file prior to visit.   Allergies  Allergen Reactions   Peanuts [Peanut Oil] Anaphylaxis   Aspirin Other (See Comments)    Does not take because of her epilepsy/seizure    Family History  Problem Relation Age of Onset   Diabetes Mother    Breast cancer Paternal Grandmother 3   Cancer Paternal Grandmother    Cancer Paternal Aunt    Cancer Maternal Grandmother    Heart disease Neg Hx    PE: BP 126/78 (BP Location: Left Arm, Patient Position: Sitting, Cuff Size: Large)   Pulse 100   Ht 5' 7"  (1.702 m)   Wt 283 lb (128.4 kg)   SpO2 99%   BMI 44.32 kg/m  Wt Readings from Last 3 Encounters:  04/04/22 283 lb (128.4 kg)  12/29/21 295 lb 11.2 oz (134.1 kg)  12/22/21 295 lb (133.8 kg)   Constitutional: overweight, in NAD Eyes: PERRLA, EOMI, no exophthalmos ENT: moist mucous membranes, no thyromegaly, no cervical lymphadenopathy Cardiovascular: tachycardia, RR, No MRG  Respiratory: CTA B Musculoskeletal: no deformities Skin: moist, warm, no rashes Neurological: no tremor with outstretched hands Diabetic Foot Exam - Simple   Simple  Foot Form Diabetic Foot exam was performed with the following findings: Yes 04/04/2022  9:30 AM  Visual Inspection No deformities, no ulcerations, no other skin breakdown bilaterally: Yes Sensation Testing Intact to touch and monofilament testing bilaterally: Yes Pulse Check Posterior Tibialis and Dorsalis pulse intact bilaterally: Yes Comments    ASSESSMENT: 1. DM2, insulin-dependent, uncontrolled, with complications - DR - MAU  2.  Thyroid nodules  3. HL  4. Obesity class 3  PLAN:  1. Patient with longstanding, uncontrolled, type 2 diabetes, on oral antidiabetic regimen with metformin and also injectable regimen with daily long-acting insulin and weekly GLP-1 receptor agonist, with improved control at last visit after starting Mounjaro.  At that time, most of the blood sugars were still above target although they were better later in the day.  We increased Mounjaro dose further. -At today's visit, sugars remain at goal, but they are at the higher end of the target range.  Therefore, we will not change her Lantus dose for now.  I plan to start decreasing the dose at next visit if sugars continue to improve. -For now, we can continue with a highest dose of Mounjaro, since she could not obtain the 12.5 mg dose.  She tolerated it well. - I suggested to:  Patient Instructions  Please continue: - Metformin ER 2000 mg with dinner - Lantus 56 units at bedtime - Mounjaro 15 mg weekly  Please return in 4 months with your sugar log.   - we checked her HbA1c: 6.5% (much better) - advised to check sugars at different times of the day - 1x a day, rotating check times - advised for yearly eye exams >> she is UTD (but need to get the full report) - return to clinic in 4 months  2.  Thyroid nodules -No neck compression symptoms -Latest TSH was normal Lab Results  Component Value Date   TSH 1.05 12/01/2021  -Reviewed the results of her previous thyroid ultrasound from 05/2019.  At that  time, she had 2 nodules biopsied (07/2019) with benign results. -We repeated a thyroid ultrasound before last visit and the nodules appears to be stable -Plan to repeat another ultrasound in a year from the previous  3. HL -At last visit, LDL was above target, HDL low: Lab Results  Component Value Date   CHOL 210 (H) 12/01/2021   HDL 34.80 (L) 12/01/2021   LDLCALC 148 (H) 12/01/2021   TRIG 133.0 12/01/2021   CHOLHDL 6 12/01/2021  -She is on Crestor 20 mg daily, tolerated well -Continuing to lose weight should also help with improvement in her LDL  4.  Obesity class III -We will continue Mounjaro which should also help with weight loss -She was previously contemplating gastric sleeve surgery, but after she lost 20 pounds on Mounjaro, plans are on hold at last visit - since last OV, she lost 18 lbs more - her OSA resolved (had a new sleep study) -She would prefer to continue with [facility] for now   Philemon Kingdom, MD PhD [facility]

## 2022-04-04 NOTE — Patient Instructions (Addendum)
Please continue: - Metformin ER 2000 mg with dinner - Lantus 56 units at bedtime - Mounjaro 15 mg weekly  Please return in 4-6 months with your sugar log.

## 2022-04-07 ENCOUNTER — Other Ambulatory Visit: Payer: Self-pay

## 2022-04-07 ENCOUNTER — Emergency Department (HOSPITAL_BASED_OUTPATIENT_CLINIC_OR_DEPARTMENT_OTHER)
Admission: EM | Admit: 2022-04-07 | Discharge: 2022-04-07 | Disposition: A | Discharge status: Home / Self Care | Payer: 59 | Attending: Emergency Medicine | Admitting: Emergency Medicine

## 2022-04-07 ENCOUNTER — Encounter (HOSPITAL_BASED_OUTPATIENT_CLINIC_OR_DEPARTMENT_OTHER): Payer: Self-pay | Admitting: Emergency Medicine

## 2022-04-07 DIAGNOSIS — R202 Paresthesia of skin: Secondary | ICD-10-CM | POA: Diagnosis not present

## 2022-04-07 DIAGNOSIS — I10 Essential (primary) hypertension: Secondary | ICD-10-CM | POA: Insufficient documentation

## 2022-04-07 DIAGNOSIS — E119 Type 2 diabetes mellitus without complications: Secondary | ICD-10-CM | POA: Diagnosis not present

## 2022-04-07 DIAGNOSIS — Z794 Long term (current) use of insulin: Secondary | ICD-10-CM | POA: Insufficient documentation

## 2022-04-07 DIAGNOSIS — Z7984 Long term (current) use of oral hypoglycemic drugs: Secondary | ICD-10-CM | POA: Diagnosis not present

## 2022-04-07 DIAGNOSIS — M79604 Pain in right leg: Secondary | ICD-10-CM | POA: Diagnosis not present

## 2022-04-07 DIAGNOSIS — Z91010 Allergy to peanuts: Secondary | ICD-10-CM | POA: Diagnosis not present

## 2022-04-07 DIAGNOSIS — Z79899 Other long term (current) drug therapy: Secondary | ICD-10-CM | POA: Diagnosis not present

## 2022-04-07 LAB — CBG MONITORING, ED: Glucose-Capillary: 112 mg/dL — ABNORMAL HIGH (ref 70–99)

## 2022-04-07 MED ORDER — OXYCODONE-ACETAMINOPHEN 5-325 MG PO TABS
1.0000 | ORAL_TABLET | Freq: Once | ORAL | Status: AC
  Administered 2022-04-07: 1 via ORAL
  Filled 2022-04-07: qty 1

## 2022-04-07 MED ORDER — OXYCODONE HCL 5 MG PO TABS: 5.0000 mg | ORAL_TABLET | ORAL | 0 refills | Status: DC | PRN

## 2022-04-07 MED ORDER — NAPROXEN 500 MG PO TABS
500.0000 mg | ORAL_TABLET | Freq: Two times a day (BID) | ORAL | 0 refills | Status: DC
Start: 2022-04-07 — End: 2022-12-16

## 2022-04-07 MED ORDER — PREDNISONE 50 MG PO TABS
60.0000 mg | ORAL_TABLET | Freq: Once | ORAL | Status: AC
  Administered 2022-04-07: 60 mg via ORAL
  Filled 2022-04-07: qty 1

## 2022-04-07 MED ORDER — PREDNISONE 50 MG PO TABS: 50.0000 mg | ORAL_TABLET | Freq: Every day | ORAL | 0 refills | Status: DC

## 2022-04-07 NOTE — ED Triage Notes (Signed)
Pt c/o right facial swelling, slight swelling noted. No difficulty swallowing or breathing. Pt also c/o right sided leg pain that shoots down, currently being treated for sciatica with gabapentin.

## 2022-04-07 NOTE — ED Provider Notes (Signed)
[facility] DEPT Provider Note   CSN: [id number] Arrival date & time: 04/07/22  0451     History  Chief Complaint  Patient presents with   Facial Swelling   Leg Pain    Erin Good is a 46 y.o. female.  The history is provided by the patient.  Leg Pain She has history of hypertension, diabetes, hyperlipidemia, epilepsy and comes in complaining of right leg pain which is worsening.  She states that she had fallen last September, and started having pain in her right leg last December.  She has seen an orthopedic physician who has diagnosed her with sciatica and treated her with a steroid Dosepak and gabapentin.  Pain has been getting progressively worse all.  However, even though pain is has in general been getting worse, it is intermittent and there are times when it is gone completely.  She has also noted some numbness in her right leg and foot.  Pain is mostly limited to the medial and lateral thigh without radiation to the back.  Over the last 2 days, she has noted some numbness in her left arm.  Over the last 24 hours, she has noted a tight feeling in the right side of her face.  She denies any bowel or bladder dysfunction.  She does state that she has difficulty walking when she first gets up in the morning, but that improves as the day goes on.   Home Medications Prior to Admission medications   Medication Sig Start Date End Date Taking? Authorizing Provider  naproxen (NAPROSYN) 500 MG tablet Take 1 tablet (500 mg total) by mouth 2 (two) times daily. 03/09/72  Yes Delora Fuel, MD  oxyCODONE (ROXICODONE) 5 MG immediate release tablet Take 1 tablet (5 mg total) by mouth every 4 (four) hours as needed for severe pain. 12/07/00  Yes Delora Fuel, MD  predniSONE (DELTASONE) 50 MG tablet Take 1 tablet (50 mg total) by mouth daily. 03/08/26  Yes Delora Fuel, MD  Cholecalciferol (VITAMIN D) 50 MCG (2000 UT) CAPS Take 1 capsule by mouth daily at 12 noon.     [provider]  clonazePAM (KLONOPIN) 0.5 MG tablet Take 0.5 mg by mouth daily as needed (Seizures).  08/14/16   [provider]  EPINEPHrine 0.3 mg/0.3 mL IJ SOAJ injection Inject 0.3 mg into the muscle as needed for anaphylaxis (for anaphylaxis).  06/14/14   [provider]  fluticasone (FLONASE) 50 MCG/ACT nasal spray Place 1 spray into both nostrils daily. 11/19/20   Hall-Potvin, [place], PA-C  gabapentin (NEURONTIN) 100 MG capsule Take 100 mg by mouth 3 (three) times daily. 03/13/22   [provider]  insulin glargine (LANTUS SOLOSTAR) 100 UNIT/ML Solostar Pen INJECT 56 UNITS INTO THE SKIN DAILY 08/25/21   Philemon Kingdom, MD  Insulin Pen Needle 32G X 4 MM MISC Use 1x a day 07/03/19   Philemon Kingdom, MD  metFORMIN (GLUCOPHAGE-XR) 500 MG 24 hr tablet Take 4 tablets (2,000 mg total) by mouth daily with supper. 08/25/21   Philemon Kingdom, MD  OneTouch Delica Lancets 06C MISC Use 2x a day with OneTouch Verio Flex 07/03/19   Philemon Kingdom, MD  [facility] VERIO test strip USE 2X A DAY WITH ONETOUCH VERIO FLEX 10/02/21   Philemon Kingdom, MD  rosuvastatin (CRESTOR) 20 MG tablet Take 1 tablet (20 mg total) by mouth daily. 10/31/20   Philemon Kingdom, MD  tirzepatide [facility]) 12.5 MG/0.5ML Pen Inject 12.5 mg into the skin once a week. 02/05/22  Philemon Kingdom, MD  tirzepatide [facility]) 15 MG/0.5ML Pen Inject 15 mg into the skin once a week. 03/22/22   Philemon Kingdom, MD  tranexamic acid (LYSTEDA) 650 MG TABS tablet Take 2 tablets (1,300 mg total) by mouth 3 (three) times daily. Take during menses for a maximum of five days 12/29/21   [name], MD  zonisamide (ZONEGRAN) 100 MG capsule Take 400 mg by mouth at bedtime.     Roque Cash, MD      Allergies    Peanuts [peanut oil] and Aspirin    Review of Systems   Review of Systems  All other systems reviewed and are negative.  Physical Exam Updated Vital Signs BP 125/80   Pulse 99   Temp 98.4  F (36.9 C)   Resp 18   Ht 5' 7"  (1.702 m)   Wt 127 kg   SpO2 99%   BMI 43.85 kg/m  Physical Exam Vitals and nursing note reviewed.  46 year old female, resting comfortably and in no acute distress. Vital signs are normal. Oxygen saturation is 99%, which is normal. Head is normocephalic and atraumatic. PERRLA, EOMI. Oropharynx is clear.  No facial swelling is appreciated. Neck is nontender and supple without adenopathy or JVD. Back is nontender and there is no CVA tenderness.  Straight leg raise is negative. Lungs are clear without rales, wheezes, or rhonchi. Chest is nontender. Heart has regular rate and rhythm without murmur. Abdomen is soft, flat, nontender without masses or hepatosplenomegaly and peristalsis is normoactive. Extremities have no cyanosis or edema, full range of motion is present.  Calf circumference is symmetric.  There is no area of tenderness elicited. Skin is warm and dry without rash. Neurologic: Mental status is normal, cranial nerves are intact.  Strength is 5/5 in all tested muscle groups.  Tested muscles include hand grasp, elbow extension, elbow flexion, hip flexion, knee extension, knee flexion, dorsiflexion, plantarflexion.  Sensory exam shows no areas of decreased sensation.  ED Results / Procedures / Treatments   Labs (all labs ordered are listed, but only abnormal results are displayed) Labs Reviewed  CBG MONITORING, ED - Abnormal; Notable for the following components:      Result Value   Glucose-Capillary 112 (*)    All other components within normal limits    EKG None  Radiology No results found.  Procedures Procedures    Medications Ordered in ED Medications  oxyCODONE-acetaminophen (PERCOCET/ROXICET) 5-325 MG per tablet 1 tablet (1 tablet Oral Given 04/07/22 0556)  predniSONE (DELTASONE) tablet 60 mg (60 mg Oral Given 04/07/22 0556)    ED Course/ Medical Decision Making/ A&P                           Medical Decision  Making Risk Prescription drug management.   Right leg pain which has been diagnosed as sciatica.  However, my exam is not consistent with sciatica.  Along with her paresthesias which are not following any kind of peripheral nerve pattern, I am concerned about possible multiple sclerosis.  There is no imaging that will be helpful today.  She is referred back to her neurologist for reevaluation.  On review of her old records, she did see her neurologist on 12/28/2021, and there was mention in the note of her right leg pain and her seeing an orthopedic physician, but the main purpose of that visit was management of her epilepsy medications.  Because of history of diabetes, I am checking a  CBG.  It has come back 112.  At this level, I feel the patient should be able to tolerate a short course of steroids.  She is given a dose of prednisone 60 mg and given a prescription for prednisone 50 mg daily for 5 days.  Also given prescription for naproxen for 2 weeks and prescription for oxycodone for pain not relieved by naproxen.  Recommended that she supplement naproxen with acetaminophen and reserve oxycodone for pain not relieved by the combination of naproxen and acetaminophen.  Final Clinical Impression(s) / ED Diagnoses Final diagnoses:  Pain in right leg  Paresthesias    Rx / DC Orders ED Discharge Orders          Ordered    predniSONE (DELTASONE) 50 MG tablet  Daily        04/07/22 0551    naproxen (NAPROSYN) 500 MG tablet  2 times daily        04/07/22 0551    oxyCODONE (ROXICODONE) 5 MG immediate release tablet  Every 4 hours PRN        04/07/22 8978              Delora Fuel, MD 47/84/12 [phone]

## 2022-04-07 NOTE — Discharge Instructions (Addendum)
I am not convinced that your pain is truly sciatica.  Please make an appointment with your neurologist for a more complete evaluation.  Take the naproxen twice a day, as prescribed.  For additional pain relief, you may add acetaminophen.  Acetaminophen dose is 2 tablets, every 6 hours as needed.  If you are still not getting adequate pain relief, then you may take the oxycodone tablet.

## 2022-04-09 ENCOUNTER — Telehealth (HOSPITAL_BASED_OUTPATIENT_CLINIC_OR_DEPARTMENT_OTHER): Payer: Self-pay | Admitting: Emergency Medicine

## 2022-04-09 MED ORDER — OXYCODONE HCL 5 MG PO TABS: 5.0000 mg | ORAL_TABLET | Freq: Four times a day (QID) | ORAL | 0 refills | Status: DC | PRN

## 2022-04-09 NOTE — Telephone Encounter (Signed)
[facility] out of prescribed pain medicine. Cancelled original order and re sent to [facility] listed.

## 2022-04-09 NOTE — ED Notes (Signed)
[facility] called regarding prescription. They are currently out of the dose ordered. EDP made aware

## 2022-06-23 ENCOUNTER — Other Ambulatory Visit: Payer: Self-pay

## 2022-06-23 ENCOUNTER — Emergency Department (HOSPITAL_BASED_OUTPATIENT_CLINIC_OR_DEPARTMENT_OTHER): Payer: 59

## 2022-06-23 ENCOUNTER — Emergency Department (HOSPITAL_BASED_OUTPATIENT_CLINIC_OR_DEPARTMENT_OTHER)
Admission: EM | Admit: 2022-06-23 | Discharge: 2022-06-23 | Disposition: A | Discharge status: Home / Self Care | Payer: 59 | Attending: Emergency Medicine | Admitting: Emergency Medicine

## 2022-06-23 ENCOUNTER — Encounter (HOSPITAL_BASED_OUTPATIENT_CLINIC_OR_DEPARTMENT_OTHER): Payer: Self-pay

## 2022-06-23 ENCOUNTER — Emergency Department (HOSPITAL_BASED_OUTPATIENT_CLINIC_OR_DEPARTMENT_OTHER): Payer: 59 | Admitting: Radiology

## 2022-06-23 DIAGNOSIS — R42 Dizziness and giddiness: Secondary | ICD-10-CM | POA: Diagnosis not present

## 2022-06-23 DIAGNOSIS — M79604 Pain in right leg: Secondary | ICD-10-CM | POA: Insufficient documentation

## 2022-06-23 DIAGNOSIS — Z794 Long term (current) use of insulin: Secondary | ICD-10-CM | POA: Insufficient documentation

## 2022-06-23 DIAGNOSIS — R079 Chest pain, unspecified: Secondary | ICD-10-CM | POA: Diagnosis present

## 2022-06-23 DIAGNOSIS — M79651 Pain in right thigh: Secondary | ICD-10-CM | POA: Diagnosis not present

## 2022-06-23 DIAGNOSIS — Z91010 Allergy to peanuts: Secondary | ICD-10-CM | POA: Diagnosis not present

## 2022-06-23 LAB — TROPONIN I (HIGH SENSITIVITY)
Troponin I (High Sensitivity): 28 ng/L — ABNORMAL HIGH (ref <18)
Troponin I (High Sensitivity): 22 ng/L — ABNORMAL HIGH (ref <18)

## 2022-06-23 LAB — BASIC METABOLIC PANEL
Anion gap: 9 (ref 5–15)
BUN: 12 mg/dL (ref 6–20)
CO2: 24 mmol/L (ref 22–32)
Calcium: 9.3 mg/dL (ref 8.9–10.3)
Chloride: 106 mmol/L (ref 98–111)
Creatinine, Ser: 0.78 mg/dL (ref 0.44–1.00)
GFR, Estimated: >60 mL/min (ref >60)
Glucose, Bld: 119 mg/dL — ABNORMAL HIGH (ref 70–99)
Potassium: 3.5 mmol/L (ref 3.5–5.1)
Sodium: 139 mmol/L (ref 135–145)

## 2022-06-23 LAB — CBC
HCT: 37.4 % (ref 36.0–46.0)
Hemoglobin: 12.3 g/dL (ref 12.0–15.0)
MCH: 26.3 pg (ref 26.0–34.0)
MCHC: 32.9 g/dL (ref 30.0–36.0)
MCV: 80.1 fL (ref 80.0–100.0)
Platelets: 308 10*3/uL (ref 150–400)
RBC: 4.67 MIL/uL (ref 3.87–5.11)
RDW: 13.7 % (ref 11.5–15.5)
WBC: 12.5 10*3/uL — ABNORMAL HIGH (ref 4.0–10.5)
nRBC: 0 % (ref 0.0–0.2)

## 2022-06-23 LAB — D-DIMER, QUANTITATIVE: D-Dimer, Quant: 0.62 ug/mL-FEU — ABNORMAL HIGH (ref 0.00–0.50)

## 2022-06-23 LAB — PREGNANCY, URINE: Preg Test, Ur: NEGATIVE

## 2022-06-23 MED ORDER — IOHEXOL 350 MG/ML SOLN
100.0000 mL | Freq: Once | INTRAVENOUS | Status: AC | PRN
  Administered 2022-06-23: 75 mL via INTRAVENOUS

## 2022-06-23 NOTE — ED Triage Notes (Signed)
Cbg in triage 127.

## 2022-06-23 NOTE — ED Triage Notes (Signed)
She c/o feeling "sharp" central chest area discomfort today, with a vague feeling of lightheadedness and weakness. She is alert and oriented x 4 with clear speech. Her husband is with her.

## 2022-06-23 NOTE — ED Notes (Signed)
Patient transported to CT 

## 2022-06-23 NOTE — Discharge Instructions (Addendum)
The test today in the ED were reassuring.  No signs of heart attack.  No signs of blood clot.  Follow-up with your doctor next week to be rechecked.  Return as needed for worsening symptoms.

## 2022-06-23 NOTE — ED Notes (Signed)
Patient verbalizes understanding of discharge instructions. Opportunity for questioning and answers were provided. Armband removed by staff, pt discharged from ED. Ambulated out to lobby  

## 2022-06-23 NOTE — ED Provider Notes (Signed)
[facility] EM[facility] DEPT Provider Note   CSN: [id number] Arrival date & time: 06/23/22  1618     History  Chief Complaint  Patient presents with   Chest Pain    Erin Good is a 46 y.o. female.   Chest Pain Pt started having pain in her chest around 2pm.  The pain was in the center left chest.  It was sharp.  She also felt a sharp pain in her right leg.  She felt weak as if she was going to pass out.  No history of heart or lung disease.  No fevers or coughing.  She does have history of prior stress test that was normal.  That was done for an abnormal ECG     Home Medications Prior to Admission medications   Medication Sig Start Date End Date Taking? Authorizing Provider  Cholecalciferol (VITAMIN D) 50 MCG (2000 UT) CAPS Take 1 capsule by mouth daily at 12 noon.    [provider]  clonazePAM (KLONOPIN) 0.5 MG tablet Take 0.5 mg by mouth daily as needed (Seizures).  08/14/16   [provider]  EPINEPHrine 0.3 mg/0.3 mL IJ SOAJ injection Inject 0.3 mg into the muscle as needed for anaphylaxis (for anaphylaxis).  06/14/14   [provider]  fluticasone (FLONASE) 50 MCG/ACT nasal spray Place 1 spray into both nostrils daily. 11/19/20   Hall-Potvin, [place], PA-C  gabapentin (NEURONTIN) 100 MG capsule Take 100 mg by mouth 3 (three) times daily. 03/13/22   [provider]  insulin glargine (LANTUS SOLOSTAR) 100 UNIT/ML Solostar Pen INJECT 56 UNITS INTO THE SKIN DAILY 08/25/21   Philemon Kingdom, MD  Insulin Pen Needle 32G X 4 MM MISC Use 1x a day 07/03/19   Philemon Kingdom, MD  metFORMIN (GLUCOPHAGE-XR) 500 MG 24 hr tablet Take 4 tablets (2,000 mg total) by mouth daily with supper. 08/25/21   Philemon Kingdom, MD  naproxen (NAPROSYN) 500 MG tablet Take 1 tablet (500 mg total) by mouth 2 (two) times daily. 02/08/79   Delora Fuel, MD  OneTouch Delica Lancets 99I MISC Use 2x a day with OneTouch Verio Flex 07/03/19   Philemon Kingdom, MD  [facility] VERIO test strip USE 2X A DAY WITH ONETOUCH VERIO FLEX 10/02/21   Philemon Kingdom, MD  oxyCODONE (ROXICODONE) 5 MG immediate release tablet Take 1 tablet (5 mg total) by mouth every 6 (six) hours as needed for severe pain. 04/09/22   Horton, Alvin Critchley, DO  predniSONE (DELTASONE) 50 MG tablet Take 1 tablet (50 mg total) by mouth daily. 01/05/81   Delora Fuel, MD  rosuvastatin (CRESTOR) 20 MG tablet Take 1 tablet (20 mg total) by mouth daily. 10/31/20   Philemon Kingdom, MD  tirzepatide [facility]) 12.5 MG/0.5ML Pen Inject 12.5 mg into the skin once a week. 02/05/22   Philemon Kingdom, MD  tirzepatide [facility]) 15 MG/0.5ML Pen Inject 15 mg into the skin once a week. 03/22/22   Philemon Kingdom, MD  tranexamic acid (LYSTEDA) 650 MG TABS tablet Take 2 tablets (1,300 mg total) by mouth 3 (three) times daily. Take during menses for a maximum of five days 12/29/21   [name], MD  zonisamide (ZONEGRAN) 100 MG capsule Take 400 mg by mouth at bedtime.     Roque Cash, MD      Allergies    Peanuts [peanut oil] and Aspirin    Review of Systems   Review of Systems  Cardiovascular:  Positive for chest pain.    Physical Exam Updated  Vital Signs BP 115/76   Pulse 88   Temp (!) 97 F (36.1 C)   Resp 11   LMP  (LMP Unknown)   SpO2 100%  Physical Exam Vitals and nursing note reviewed.  Constitutional:      General: She is not in acute distress.    Appearance: She is well-developed.  HENT:     Head: Normocephalic and atraumatic.     Right Ear: External ear normal.     Left Ear: External ear normal.  Eyes:     General: No scleral icterus.       Right eye: No discharge.        Left eye: No discharge.     Conjunctiva/sclera: Conjunctivae normal.  Neck:     Trachea: No tracheal deviation.  Cardiovascular:     Rate and Rhythm: Normal rate and regular rhythm.  Pulmonary:     Effort: Pulmonary effort is normal. No respiratory distress.     Breath sounds: Normal breath  sounds. No stridor. No wheezing or rales.  Abdominal:     General: Bowel sounds are normal. There is no distension.     Palpations: Abdomen is soft.     Tenderness: There is no abdominal tenderness. There is no guarding or rebound.  Musculoskeletal:        General: No tenderness or deformity.     Cervical back: Neck supple.  Skin:    General: Skin is warm and dry.     Findings: No rash.  Neurological:     General: No focal deficit present.     Mental Status: She is alert.     Cranial Nerves: No cranial nerve deficit (no facial droop, extraocular movements intact, no slurred speech).     Sensory: No sensory deficit.     Motor: No abnormal muscle tone or seizure activity.     Coordination: Coordination normal.  Psychiatric:        Mood and Affect: Mood normal.     ED Results / Procedures / Treatments   Labs (all labs ordered are listed, but only abnormal results are displayed) Labs Reviewed  BASIC METABOLIC PANEL - Abnormal; Notable for the following components:      Result Value   Glucose, Bld 119 (*)    All other components within normal limits  CBC - Abnormal; Notable for the following components:   WBC 12.5 (*)    All other components within normal limits  D-DIMER, QUANTITATIVE - Abnormal; Notable for the following components:   D-Dimer, Quant 0.62 (*)    All other components within normal limits  TROPONIN I (HIGH SENSITIVITY) - Abnormal; Notable for the following components:   Troponin I (High Sensitivity) 28 (*)    All other components within normal limits  TROPONIN I (HIGH SENSITIVITY) - Abnormal; Notable for the following components:   Troponin I (High Sensitivity) 22 (*)    All other components within normal limits  PREGNANCY, URINE    EKG EKG Interpretation  Date/Time:  Saturday June 23 2022 16:28:00 EDT Ventricular Rate:  94 PR Interval:  190 QRS Duration: 88 QT Interval:  346 QTC Calculation: 432 R Axis:   34 Text Interpretation: Normal sinus rhythm  Normal ECG When compared with ECG of 08-Mar-2020 13:59, No significant change was found Confirmed by Dorie Rank [phone]) on 06/23/2022 4:35:06 PM  Radiology CT Angio Chest PE W and/or Wo Contrast  Result Date: 06/23/2022 CLINICAL DATA:  Positive D-dimer.  Concern for pulmonary embolism. EXAM: CT ANGIOGRAPHY  CHEST WITH CONTRAST TECHNIQUE: Multidetector CT imaging of the chest was performed using the standard protocol during bolus administration of intravenous contrast. Multiplanar CT image reconstructions and MIPs were obtained to evaluate the vascular anatomy. RADIATION DOSE REDUCTION: This exam was performed according to the departmental dose-optimization program which includes automated exposure control, adjustment of the mA and/or kV according to patient size and/or use of iterative reconstruction technique. CONTRAST:  6m OMNIPAQUE IOHEXOL 350 MG/ML SOLN COMPARISON:  Chest CT dated 03/08/2020 and radiograph dated 06/23/2022. FINDINGS: Cardiovascular: There is no cardiomegaly or pericardial effusion. The thoracic aorta is unremarkable. Evaluation of the pulmonary arteries is limited due to suboptimal opacification. No pulmonary artery embolus identified. Mediastinum/Nodes: There is no hilar or mediastinal adenopathy. The esophagus is grossly unremarkable. No mediastinal fluid collection. Lungs/Pleura: The lungs are clear. There is no pleural effusion or pneumothorax. The central airways are patent. Upper Abdomen: No acute abnormality. Musculoskeletal: No acute osseous pathology. Review of the MIP images confirms the above findings. IMPRESSION: No acute intrathoracic pathology. No CT evidence of pulmonary embolism. Electronically Signed   By: AAnner CreteM.D.   On: 06/23/2022 20:24   UKoreaVenous Img Lower Unilateral Right  Result Date: 06/23/2022 CLINICAL DATA:  Right thigh pain EXAM: RIGHT LOWER EXTREMITY VENOUS DOPPLER ULTRASOUND TECHNIQUE: Gray-scale sonography with compression, as well as color and  duplex ultrasound, were performed to evaluate the deep venous system(s) from the level of the common femoral vein through the popliteal and proximal calf veins. COMPARISON:  None Available. FINDINGS: VENOUS Normal compressibility of the common femoral, superficial femoral, and popliteal veins, as well as the visualized calf veins. Visualized portions of profunda femoral vein and great saphenous vein unremarkable. No filling defects to suggest DVT on grayscale or color Doppler imaging. Doppler waveforms show normal direction of venous flow, normal respiratory plasticity and response to augmentation. Limited views of the contralateral common femoral vein are unremarkable. OTHER None. Limitations: Calf veins due to body habitus. IMPRESSION: No evidence of right lower extremity DVT. Electronically Signed   By: KRolm BaptiseM.D.   On: 06/23/2022 19:31   DG Chest 2 View  Result Date: 06/23/2022 CLINICAL DATA:  Left-sided chest pain.  Dizziness today. EXAM: CHEST - 2 VIEW COMPARISON:  03/08/2020. FINDINGS: Cardiac silhouette is normal in size and configuration. Normal mediastinal and hilar contours. Clear lungs.  No pleural effusion or pneumothorax. Skeletal structures are unremarkable. IMPRESSION: No active cardiopulmonary disease. Electronically Signed   By: DLajean ManesM.D.   On: 06/23/2022 17:21    Procedures Procedures    Medications Ordered in ED Medications  iohexol (OMNIPAQUE) 350 MG/ML injection 100 mL (75 mLs Intravenous Contrast Given 06/23/22 2005)    ED Course/ Medical Decision Making/ A&P Clinical Course as of 06/23/22 2107  Sat Jun 23, 2022  1941 CBC shows an elevated white blood cell count.  Pregnancy test is negative.  Metabolic panel is unremarkable.  Her first troponin is slightly elevated. [JK]  1941 Second troponin is decreased and actually similar to previous values.  D-dimer slightly elevated.  We will proceed with CT angio [JK]    Clinical Course User Index [JK] KDorie Rank MD            HEART Score: 2                Medical Decision Making Problems Addressed: Chest pain, unspecified type: acute illness or injury that poses a threat to life or bodily functions  Amount and/or Complexity of Data Reviewed  Labs: ordered. Decision-making details documented in ED Course. Radiology: ordered and independent interpretation performed. Decision-making details documented in ED Course.  Risk Prescription drug management.   Presented ED for evaluation of sharp chest pain.  Patient also experienced some leg discomfort.  I was concerned about the possibility of PE DVT.  Also consider the possibility of ACS pleurisy.  ED work-up reassuring.  No signs of pneumonia.  Her troponins are slightly elevated but she does have prior history of elevated troponin.  Her second troponin was decreased and her symptoms are not suggestive of acute cardiac ischemia.  I doubt her symptoms are related to unstable angina.  Patient's Doppler study is negative.  She did have a slightly elevated D-dimer so CT angiogram was performed and fortunately no signs of PE  Evaluation and diagnostic testing in the emergency department does not suggest an emergent condition requiring admission or immediate intervention beyond what has been performed at this time.  The patient is safe for discharge and has been instructed to return immediately for worsening symptoms, change in symptoms or any other concerns.         Final Clinical Impression(s) / ED Diagnoses Final diagnoses:  Chest pain, unspecified type    Rx / DC Orders ED Discharge Orders     None         Dorie Rank, MD 06/23/22 2107

## 2022-06-25 LAB — CBG MONITORING, ED: Glucose-Capillary: 127 mg/dL — ABNORMAL HIGH (ref 70–99)

## 2022-06-26 ENCOUNTER — Encounter: Payer: Self-pay | Admitting: Family Medicine

## 2022-06-26 ENCOUNTER — Ambulatory Visit (INDEPENDENT_AMBULATORY_CARE_PROVIDER_SITE_OTHER): Payer: 59 | Admitting: Family Medicine

## 2022-06-26 VITALS — BP 138/84 | HR 97 | Resp 16 | Ht 67.0 in | Wt 280.0 lb

## 2022-06-26 DIAGNOSIS — R0789 Other chest pain: Secondary | ICD-10-CM | POA: Diagnosis not present

## 2022-06-26 MED ORDER — OMEPRAZOLE 40 MG PO CPDR: 40.0000 mg | DELAYED_RELEASE_CAPSULE | Freq: Every day | ORAL | 0 refills | Status: DC

## 2022-06-26 NOTE — Progress Notes (Signed)
Name: Erin Good   MRN: [id number]    DOB: [birth year]   Date:06/26/2022       Progress Note  Subjective  Chief Complaint  ED Follow-Up  HPI  06/23/22: Chest Pain  She went in due to sharp , shooting pain associated with dizziness  that was intense for about 15-20 minutes , improved by the time she arrived to [facility] but pain lasted about 4 hours. She states now pain is intermittent and worse at night. Not associated with nausea or vomiting, did not have diaphoresis. No change in diet. Only aggravating factor is laying down Denies indigestion or change in bowel movement  Reviewed labs and studies with patient, troponin slightly up but improved, d-dimer slightly positive but negative for PE and negative doppler US.   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   Gastro-esophageal reflux disease without esophagitis 45/01/8881   Dysmetabolic syndrome 80/01/4916   NASH (nonalcoholic steatohepatitis) 04/20/2015   Allergy to nuts 04/20/2015   Calculus of kidney 04/20/2015   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   Central sleep apnea 11/26/2008   Dyslipidemia 07/02/2008   Leukocytosis 07/29/2007    Past Surgical History:  Procedure Laterality Date   BREAST BIOPSY Left 2018   benign   CESAREAN SECTION     X 2   IRRIGATION AND DEBRIDEMENT SHOULDER Left 10/12/2020   Procedure: IRRIGATION AND DEBRIDEMENT  SHOULDER;  Surgeon: Hiram Gash, MD;  Location: [facility];  Service: Orthopedics;  Laterality: Left;   RIGHT OOPHORECTOMY Right 2001   benign tumor   TUBAL LIGATION  2007    Family History  Problem Relation Age of Onset   Diabetes Mother    Breast cancer Paternal Grandmother 36   Cancer Paternal Grandmother    Cancer Paternal Aunt    Cancer Maternal Grandmother    Heart disease Neg Hx     Social History   Tobacco Use   Smoking status: Never   Smokeless tobacco: Never  Substance Use Topics   Alcohol use: Yes    Comment: occass     Current Outpatient Medications:    Cholecalciferol (VITAMIN D) 50 MCG (2000 UT) CAPS, Take 1 capsule by mouth daily at 12 noon., Disp: , Rfl:    clonazePAM (KLONOPIN) 0.5 MG tablet, Take 0.5 mg by mouth daily as needed (Seizures). , Disp: , Rfl: 3   EPINEPHrine 0.3 mg/0.3 mL IJ SOAJ injection, Inject 0.3 mg into the muscle as needed for anaphylaxis (for anaphylaxis). , Disp: , Rfl:    fluticasone (FLONASE) 50 MCG/ACT nasal spray, Place 1 spray into both nostrils daily., Disp: 16 g, Rfl: 0   gabapentin (NEURONTIN) 100 MG capsule, Take 100 mg by mouth 3 (three) times daily., Disp: , Rfl:    insulin glargine (LANTUS SOLOSTAR) 100 UNIT/ML Solostar Pen, INJECT 56 UNITS INTO THE SKIN DAILY, Disp: 45 mL, Rfl: 3   Insulin Pen Needle 32G  X 4 MM MISC, Use 1x a day, Disp: 100 each, Rfl: 3   metFORMIN (GLUCOPHAGE-XR) 500 MG 24 hr tablet, Take 4 tablets (2,000 mg total) by mouth daily with supper., Disp: 360 tablet, Rfl: 3   naproxen (NAPROSYN) 500 MG tablet, Take 1 tablet (500 mg total) by mouth 2 (two) times daily., Disp: 30 tablet, Rfl: 0   OneTouch Delica Lancets 52D MISC, Use 2x a day with OneTouch Verio Flex, Disp: 200 each, Rfl: 3   ONETOUCH VERIO test strip, USE 2X A DAY WITH ONETOUCH VERIO FLEX, Disp: 50 strip, Rfl: 15   oxyCODONE (ROXICODONE) 5 MG immediate release tablet, Take 1 tablet (5 mg total) by mouth every 6 (six) hours as needed for severe pain.,  Disp: 10 tablet, Rfl: 0   predniSONE (DELTASONE) 50 MG tablet, Take 1 tablet (50 mg total) by mouth daily., Disp: 5 tablet, Rfl: 0   rosuvastatin (CRESTOR) 20 MG tablet, Take 1 tablet (20 mg total) by mouth daily., Disp: 90 tablet, Rfl: 3   tirzepatide (MOUNJARO) 12.5 MG/0.5ML Pen, Inject 12.5 mg into the skin once a week., Disp: 6 mL, Rfl: 1   tirzepatide (MOUNJARO) 15 MG/0.5ML Pen, Inject 15 mg into the skin once a week., Disp: 2 mL, Rfl: 1   tranexamic acid (LYSTEDA) 650 MG TABS tablet, Take 2 tablets (1,300 mg total) by mouth 3 (three) times daily. Take during menses for a maximum of five days, Disp: 30 tablet, Rfl: 2   zonisamide (ZONEGRAN) 100 MG capsule, Take 400 mg by mouth at bedtime. , Disp: , Rfl:   Allergies  Allergen Reactions   Peanuts [Peanut Oil] Anaphylaxis   Aspirin Other (See Comments)    Does not take because of her epilepsy/seizure     I personally reviewed active problem list, medication list, allergies, family history, social history, health maintenance with the patient/caregiver today.   ROS  Ten systems reviewed and is negative except as mentioned in HPI   Objective  Vitals:   06/26/22 1345  BP: 138/84  Pulse: 97  Resp: 16  SpO2: 98%  Weight: 280 lb (127 kg)  Height: 5' 7"  (1.702 m)    Body mass index is 43.85 kg/m.  Physical Exam  Constitutional: Patient appears well-developed and well-nourished. Obese  No distress.  HEENT: head atraumatic, normocephalic, pupils equal and reactive to light, neck supple Cardiovascular: Normal rate, regular rhythm and normal heart sounds.  No murmur heard. No BLE edema. Pulmonary/Chest: Effort normal and breath sounds normal. No respiratory distress. Abdominal: Soft.  There is no tenderness. Psychiatric: Patient has a normal mood and affect. behavior is normal. Judgment and thought content normal.   Recent Results (from the past 2160 hour(s))  POCT Glucose (Device for Home Use)     Status: Abnormal   Collection  Time: 04/04/22  9:22 AM  Result Value Ref Range   Glucose Fasting, POC 157 (A) 70 - 99 mg/dL   POC Glucose    POCT glycosylated hemoglobin (Hb A1C)     Status: Abnormal   Collection Time: 04/04/22  9:23 AM  Result Value Ref Range   Hemoglobin A1C 6.5 (A) 4.0 - 5.6 %   HbA1c POC (<> result, manual entry)     HbA1c, POC (prediabetic range)     HbA1c, POC (controlled diabetic range)    POC CBG, ED     Status: Abnormal   Collection Time: 04/07/22  5:47 AM  Result Value Ref Range   Glucose-Capillary 112 (H) 70 - 99  mg/dL    Comment: Glucose reference range applies only to samples taken after fasting for at least 8 hours.  Basic metabolic panel     Status: Abnormal   Collection Time: 06/23/22  4:35 PM  Result Value Ref Range   Sodium 139 135 - 145 mmol/L   Potassium 3.5 3.5 - 5.1 mmol/L   Chloride 106 98 - 111 mmol/L   CO2 24 22 - 32 mmol/L   Glucose, Bld 119 (H) 70 - 99 mg/dL    Comment: Glucose reference range applies only to samples taken after fasting for at least 8 hours.   BUN 12 6 - 20 mg/dL   Creatinine, Ser 0.78 0.44 - 1.00 mg/dL   Calcium 9.3 8.9 - 10.3 mg/dL   GFR, Estimated >60 >60 mL/min    Comment: (NOTE) Calculated using the CKD-EPI Creatinine Equation (2021)    Anion gap 9 5 - 15    Comment: Performed at [facility], [address], [place], [place] [address]  CBC     Status: Abnormal   Collection Time: 06/23/22  4:35 PM  Result Value Ref Range   WBC 12.5 (H) 4.0 - 10.5 K/uL   RBC 4.67 3.87 - 5.11 MIL/uL   Hemoglobin 12.3 12.0 - 15.0 g/dL   HCT 37.4 36.0 - 46.0 %   MCV 80.1 80.0 - 100.0 fL   MCH 26.3 26.0 - 34.0 pg   MCHC 32.9 30.0 - 36.0 g/dL   RDW 13.7 11.5 - 15.5 %   Platelets 308 150 - 400 K/uL   nRBC 0.0 0.0 - 0.2 %    Comment: Performed at [facility], [place], [place] [address]  Troponin I (High Sensitivity)     Status: Abnormal   Collection Time: 06/23/22  4:35 PM  Result Value Ref Range    Troponin I (High Sensitivity) 28 (H) <18 ng/L    Comment: (NOTE) Elevated high sensitivity troponin I (hsTnI) values and significant  changes across serial measurements may suggest ACS but many other  chronic and acute conditions are known to elevate hsTnI results.  Refer to the "Links" section for chest pain algorithms and additional  guidance. Performed at [facility], [address] [place], [place] [address]   CBG monitoring, ED     Status: Abnormal   Collection Time: 06/23/22  4:39 PM  Result Value Ref Range   Glucose-Capillary 127 (H) 70 - 99 mg/dL    Comment: Glucose reference range applies only to samples taken after fasting for at least 8 hours.  Pregnancy, urine     Status: None   Collection Time: 06/23/22  6:56 PM  Result Value Ref Range   Preg Test, Ur NEGATIVE NEGATIVE    Comment:        THE SENSITIVITY OF THIS METHODOLOGY IS >20 mIU/mL. Performed at [facility], [place], [place] [address]   Troponin I (High Sensitivity)     Status: Abnormal   Collection Time: 06/23/22  6:56 PM  Result Value Ref Range   Troponin I (High Sensitivity) 22 (H) <18 ng/L    Comment: (NOTE) Elevated high sensitivity troponin I (hsTnI) values and significant  changes across serial measurements may suggest ACS but many other  chronic and acute conditions are known to elevate hsTnI results.  Refer to the "Links" section for chest pain algorithms and additional  guidance. Performed at [facility], [address], [place], [place] [address]   D-dimer,  quantitative     Status: Abnormal   Collection Time: 06/23/22  6:56 PM  Result Value Ref Range   D-Dimer, Quant 0.62 (H) 0.00 - 0.50 ug/mL-FEU    Comment: (NOTE) At the manufacturer cut-off value of 0.5 g/mL FEU, this assay has a negative predictive value of 95-100%.This assay is intended for use in conjunction with a clinical pretest probability (PTP)  assessment model to exclude pulmonary embolism (PE) and deep venous thrombosis (DVT) in outpatients suspected of PE or DVT. Results should be correlated with clinical presentation. Performed at [facility], [address] [place], [place] [address]     PHQ2/9:    06/26/2022    1:45 PM 12/22/2021    1:00 PM 11/29/2020   11:18 AM 11/21/2020    2:14 PM 08/15/2020    1:36 PM  Depression screen PHQ 2/9  Decreased Interest 0 0 0 0 0  Down, Depressed, Hopeless 0 0 0 0 0  PHQ - 2 Score 0 0 0 0 0  Altered sleeping 0 0 0 0   Tired, decreased energy 3 0 3 0   Change in appetite 0 0 3 0   Feeling bad or failure about yourself  0 0 0 0   Trouble concentrating 0 0 0 0   Moving slowly or fidgety/restless 0 0 0 0   Suicidal thoughts 0 0 0 0   PHQ-9 Score 3 0 6 0   Difficult doing work/chores   Not difficult at all Not difficult at all     phq 9 is negative   Fall Risk:    06/26/2022    1:45 PM 12/22/2021    1:00 PM 11/29/2020   11:18 AM 11/21/2020    2:14 PM 08/15/2020    1:36 PM  Fall Risk   Falls in the past year? 1 1 0 0 1  Number falls in past yr: 0 0 0 0 1  Injury with Fall? 1 1 0 0 1  Risk for fall due to : No Fall Risks No Fall Risks   History of fall(s)  Follow up Falls prevention discussed Falls prevention discussed         Functional Status Survey: Is the patient deaf or have difficulty hearing?: No Does the patient have difficulty seeing, even when wearing glasses/contacts?: No Does the patient have difficulty concentrating, remembering, or making decisions?: No Does the patient have difficulty walking or climbing stairs?: No Does the patient have difficulty dressing or bathing?: No Does the patient have difficulty doing errands alone such as visiting a doctor's office or shopping?: No    Assessment & Plan  1. Atypical chest pain  - omeprazole (PRILOSEC) 40 MG capsule; Take 1 capsule (40 mg total) by mouth daily. Before dinner  Dispense: 30  capsule; Refill: 0

## 2022-06-26 NOTE — Patient Instructions (Signed)
Gastroesophageal Reflux Disease, Adult Gastroesophageal reflux (GER) happens when acid from the stomach flows up into the tube that connects the mouth and the stomach (esophagus). Normally, food travels down the esophagus and stays in the stomach to be digested. However, when a person has GER, food and stomach acid sometimes move back up into the esophagus. If this becomes a more serious problem, the person may be diagnosed with a disease called gastroesophageal reflux disease (GERD). GERD occurs when the reflux: Happens often. Causes frequent or severe symptoms. Causes problems such as damage to the esophagus. When stomach acid comes in contact with the esophagus, the acid may cause inflammation in the esophagus. Over time, GERD may create small holes (ulcers) in the lining of the esophagus. What are the causes? This condition is caused by a problem with the muscle between the esophagus and the stomach (lower esophageal sphincter, or LES). Normally, the LES muscle closes after food passes through the esophagus to the stomach. When the LES is weakened or abnormal, it does not close properly, and that allows food and stomach acid to go back up into the esophagus. The LES can be weakened by certain dietary substances, medicines, and medical conditions, including: Tobacco use. Pregnancy. Having a hiatal hernia. Alcohol use. Certain foods and beverages, such as coffee, chocolate, onions, and peppermint. What increases the risk? You are more likely to develop this condition if you: Have an increased body weight. Have a connective tissue disorder. Take NSAIDs, such as ibuprofen. What are the signs or symptoms? Symptoms of this condition include: Heartburn. Difficult or painful swallowing and the feeling of having a lump in the throat. A bitter taste in the mouth. Bad breath and having a large amount of saliva. Having an upset or bloated stomach and belching. Chest pain. Different conditions can  cause chest pain. Make sure you see your health care provider if you experience chest pain. Shortness of breath or wheezing. Ongoing (chronic) cough or a nighttime cough. Wearing away of tooth enamel. Weight loss. How is this diagnosed? This condition may be diagnosed based on a medical history and a physical exam. To determine if you have mild or severe GERD, your health care provider may also monitor how you respond to treatment. You may also have tests, including: A test to examine your stomach and esophagus with a small camera (endoscopy). A test that measures the acidity level in your esophagus. A test that measures how much pressure is on your esophagus. A barium swallow or modified barium swallow test to show the shape, size, and functioning of your esophagus. How is this treated? Treatment for this condition may vary depending on how severe your symptoms are. Your health care provider may recommend: Changes to your diet. Medicine. Surgery. The goal of treatment is to help relieve your symptoms and to prevent complications. Follow these instructions at home: Eating and drinking  Follow a diet as recommended by your health care provider. This may involve avoiding foods and drinks such as: Coffee and tea, with or without caffeine. Drinks that contain alcohol. Energy drinks and sports drinks. Carbonated drinks or sodas. Chocolate and cocoa. Peppermint and mint flavorings. Garlic and onions. Horseradish. Spicy and acidic foods, including peppers, chili powder, curry powder, vinegar, hot sauces, and barbecue sauce. Citrus fruit juices and citrus fruits, such as oranges, lemons, and limes. Tomato-based foods, such as red sauce, chili, salsa, and pizza with red sauce. Fried and fatty foods, such as donuts, french fries, potato chips, and high-fat dressings.  High-fat meats, such as hot dogs and fatty cuts of red and white meats, such as rib eye steak, sausage, ham, and  bacon. High-fat dairy items, such as whole milk, butter, and cream cheese. Eat small, frequent meals instead of large meals. Avoid drinking large amounts of liquid with your meals. Avoid eating meals during the 2-3 hours before bedtime. Avoid lying down right after you eat. Do not exercise right after you eat. Lifestyle  Do not use any products that contain nicotine or tobacco. These products include cigarettes, chewing tobacco, and vaping devices, such as e-cigarettes. If you need help quitting, ask your health care provider. Try to reduce your stress by using methods such as yoga or meditation. If you need help reducing stress, ask your health care provider. If you are overweight, reduce your weight to an amount that is healthy for you. Ask your health care provider for guidance about a safe weight loss goal. General instructions Pay attention to any changes in your symptoms. Take over-the-counter and prescription medicines only as told by your health care provider. Do not take aspirin, ibuprofen, or other NSAIDs unless your health care provider told you to take these medicines. Wear loose-fitting clothing. Do not wear anything tight around your waist that causes pressure on your abdomen. Raise (elevate) the head of your bed about 6 inches (15 cm). You can use a wedge to do this. Avoid bending over if this makes your symptoms worse. Keep all follow-up visits. This is important. Contact a health care provider if: You have: New symptoms. Unexplained weight loss. Difficulty swallowing or it hurts to swallow. Wheezing or a persistent cough. A hoarse voice. Your symptoms do not improve with treatment. Get help right away if: You have sudden pain in your arms, neck, jaw, teeth, or back. You suddenly feel sweaty, dizzy, or light-headed. You have chest pain or shortness of breath. You vomit and the vomit is green, yellow, or black, or it looks like blood or coffee grounds. You faint. You  have stool that is red, bloody, or black. You cannot swallow, drink, or eat. These symptoms may represent a serious problem that is an emergency. Do not wait to see if the symptoms will go away. Get medical help right away. Call your local emergency services (911 in the [place] Do not drive yourself to the hospital. Summary Gastroesophageal reflux happens when acid from the stomach flows up into the esophagus. GERD is a disease in which the reflux happens often, causes frequent or severe symptoms, or causes problems such as damage to the esophagus. Treatment for this condition may vary depending on how severe your symptoms are. Your health care provider may recommend diet and lifestyle changes, medicine, or surgery. Contact a health care provider if you have new or worsening symptoms. Take over-the-counter and prescription medicines only as told by your health care provider. Do not take aspirin, ibuprofen, or other NSAIDs unless your health care provider told you to do so. Keep all follow-up visits as told by your health care provider. This is important. This information is not intended to replace advice given to you by your health care provider. Make sure you discuss any questions you have with your health care provider. Document Revised: 05/02/2020 Document Reviewed: 05/02/2020 Elsevier Patient Education  [employer].

## 2022-07-05 ENCOUNTER — Encounter: Payer: Self-pay | Admitting: Family Medicine

## 2022-07-23 ENCOUNTER — Ambulatory Visit
Admission: RE | Admit: 2022-07-23 | Discharge: 2022-07-23 | Disposition: A | Discharge status: Home / Self Care | Payer: 59 | Source: Ambulatory Visit | Attending: Obstetrics and Gynecology | Admitting: Obstetrics and Gynecology

## 2022-07-23 DIAGNOSIS — Z01419 Encounter for gynecological examination (general) (routine) without abnormal findings: Secondary | ICD-10-CM | POA: Diagnosis present

## 2022-07-23 DIAGNOSIS — Z1231 Encounter for screening mammogram for malignant neoplasm of breast: Secondary | ICD-10-CM | POA: Insufficient documentation

## 2022-07-25 ENCOUNTER — Other Ambulatory Visit: Payer: Self-pay | Admitting: Obstetrics and Gynecology

## 2022-07-25 DIAGNOSIS — R928 Other abnormal and inconclusive findings on diagnostic imaging of breast: Secondary | ICD-10-CM

## 2022-07-25 DIAGNOSIS — N6489 Other specified disorders of breast: Secondary | ICD-10-CM

## 2022-07-27 ENCOUNTER — Other Ambulatory Visit (HOSPITAL_COMMUNITY): Payer: Self-pay

## 2022-07-27 ENCOUNTER — Other Ambulatory Visit: Payer: Self-pay | Admitting: Family Medicine

## 2022-07-27 ENCOUNTER — Telehealth: Payer: Self-pay

## 2022-07-27 DIAGNOSIS — R0789 Other chest pain: Secondary | ICD-10-CM

## 2022-07-27 NOTE — Telephone Encounter (Signed)
Received notification from [facility] regarding a prior authorization for [facility] 12.71m/0.5ml. Authorization has been has been sent via CMM.  Authorization is pending  Key# [id number]

## 2022-07-30 ENCOUNTER — Other Ambulatory Visit (HOSPITAL_COMMUNITY): Payer: Self-pay

## 2022-07-30 NOTE — Telephone Encounter (Signed)
PA didn't go through. There was some kind of error with the pt's information. It appears that the pt was able to get the medication this month already, so no PA needed at this time.

## 2022-07-31 ENCOUNTER — Other Ambulatory Visit (HOSPITAL_COMMUNITY): Payer: Self-pay

## 2022-08-01 ENCOUNTER — Other Ambulatory Visit (HOSPITAL_COMMUNITY): Payer: Self-pay

## 2022-08-08 ENCOUNTER — Ambulatory Visit
Admission: RE | Admit: 2022-08-08 | Discharge: 2022-08-08 | Disposition: A | Discharge status: Home / Self Care | Payer: 59 | Source: Ambulatory Visit | Attending: Obstetrics and Gynecology | Admitting: Obstetrics and Gynecology

## 2022-08-08 DIAGNOSIS — N6489 Other specified disorders of breast: Secondary | ICD-10-CM | POA: Diagnosis present

## 2022-08-08 DIAGNOSIS — R928 Other abnormal and inconclusive findings on diagnostic imaging of breast: Secondary | ICD-10-CM | POA: Diagnosis present

## 2022-08-10 ENCOUNTER — Other Ambulatory Visit: Payer: Self-pay | Admitting: Family Medicine

## 2022-08-10 DIAGNOSIS — R0789 Other chest pain: Secondary | ICD-10-CM

## 2022-08-28 ENCOUNTER — Encounter: Payer: Self-pay | Admitting: Family Medicine

## 2022-09-04 ENCOUNTER — Ambulatory Visit (INDEPENDENT_AMBULATORY_CARE_PROVIDER_SITE_OTHER): Payer: 59 | Admitting: Internal Medicine

## 2022-09-04 ENCOUNTER — Other Ambulatory Visit: Payer: 59

## 2022-09-04 ENCOUNTER — Encounter: Payer: Self-pay | Admitting: Internal Medicine

## 2022-09-04 VITALS — BP 128/84 | HR 101 | Ht 67.0 in | Wt 279.2 lb

## 2022-09-04 DIAGNOSIS — E785 Hyperlipidemia, unspecified: Secondary | ICD-10-CM | POA: Diagnosis not present

## 2022-09-04 DIAGNOSIS — E042 Nontoxic multinodular goiter: Secondary | ICD-10-CM

## 2022-09-04 DIAGNOSIS — E1129 Type 2 diabetes mellitus with other diabetic kidney complication: Secondary | ICD-10-CM | POA: Diagnosis not present

## 2022-09-04 DIAGNOSIS — R809 Proteinuria, unspecified: Secondary | ICD-10-CM | POA: Diagnosis not present

## 2022-09-04 DIAGNOSIS — E11319 Type 2 diabetes mellitus with unspecified diabetic retinopathy without macular edema: Secondary | ICD-10-CM

## 2022-09-04 DIAGNOSIS — Z794 Long term (current) use of insulin: Secondary | ICD-10-CM

## 2022-09-04 LAB — POCT GLYCOSYLATED HEMOGLOBIN (HGB A1C): Hemoglobin A1C: 6.2 % — AB (ref 4.0–5.6)

## 2022-09-04 MED ORDER — MOUNJARO 15 MG/0.5ML ~~LOC~~ SOAJ: 15.0000 mg | SUBCUTANEOUS | 3 refills | Status: DC

## 2022-09-04 NOTE — Progress Notes (Addendum)
Patient ID: Erin Good, female   DOB: 02/19/1976, 46 y.o.   MRN: [id number]   HPI: Erin Good is a 46 y.o.-year-old female, initially referred by her PCP, Dr. Ancil Boozer, presenting for follow-up for DM2, dx in ~2015, insulin-dependent since 2019, uncontrolled, with complications (diabetic retinopathy, microalbuminuria) and also for thyroid nodules.  Last visit  5 months ago.  Interim history: No increased urination, blurry vision, nausea, chest pain.  She lost approximately 38 pounds before the last 2 visits combined. She had problems with getting Mounjaro at the max dose, but was on 12.5 mg x 2 mo since last OV. Now back on 15 mg weekly.  Reviewed HbA1c levels: Lab Results  Component Value Date   HGBA1C 6.5 (A) 04/04/2022   HGBA1C 7.8 (A) 12/01/2021   HGBA1C 9.7 (A) 09/01/2021   HGBA1C 7.6 (H) 10/03/2020   HGBA1C 7.9 (A) 08/15/2020   HGBA1C 9.0 (H) 06/30/2020   HGBA1C 8.3 (A) 05/30/2020   HGBA1C 8.0 (A) 11/12/2019   HGBA1C 10.9 (H) 05/25/2019   HGBA1C 10.0 (A) 01/29/2019   Pt was on a regimen of: - Metformin ER 750 mg 2x a day, with meals - Soliqua (Lantus + Lixisenatide) 50 units daily in am  Currently on: - Metformin ER 750 mg 2x a day with meals >> 1500 >> 2000 mg with dinner - Lantus 50 >> 56 units at bedtime - Ozempic 1 mg weekly >> Mounjaro 7.5 >> 10 >> 12.5 >> 15 mg weekly Tried Farxiga 06/2020 >> not covered.  She checks her sugars 1-2 times a day-no log, no meter: - am: 120-155 >> 130-206 >> 110, 120-130s, 160 >> 110-130 - 2h after b'fast: n/c >> 85-130 >> n/c >> 166, 207 >> n/c - before lunch:  92-180s >> n/c >> 120-140 >> 110-140, 150 - 2h after lunch:  140-180 >> 160s-210 >> 125-153 >> n/c - before dinner: 178-198 >> 70s, 120-140 >> 110-140, 150 - 2h after dinner: n/c  - bedtime: 150s >> n/c >> 170-330 >> N/c >> 76 if skips the meal - nighttime: n/c Lowest sugar was 80 >> 120 >> 82 >> 70s >> 76 ; she has hypoglycemia awareness at 100. Highest  sugar was 330 >> 206 >> 160 >> 150.  Glucometer: [employer] advance  Pt's meals are: - Breakfast: 2 eggs + 3 pieces of bacon >> 2 eggs - snack: fruit - Lunch: salad - Dinner:meat + veggie + starch (rice + pasta) - Snacks: 1  Reducing sodas: Coke >> sparkling water. I did advise her to stop juice in the past >> off now.   -No CKD, last BUN/creatinine:  Lab Results  Component Value Date   BUN 12 06/23/2022   BUN 12 12/01/2021   CREATININE 0.78 06/23/2022   CREATININE 0.85 12/01/2021  Not on ACE inhibitor/ARB.  ACR levels are high: Lab Results  Component Value Date   MICRALBCREAT 1.6 12/01/2021   MICRALBCREAT 3.9 10/27/2020   MICRALBCREAT 33 (H) 11/12/2019   MICRALBCREAT 30 (H) 01/29/2019   -+ HL; last set of lipids: Lab Results  Component Value Date   CHOL 210 (H) 12/01/2021   HDL 34.80 (L) 12/01/2021   LDLCALC 148 (H) 12/01/2021   TRIG 133.0 12/01/2021   CHOLHDL 6 12/01/2021  At last visit, she was on Crestor 10 mg daily and I advised her to increase the dose to 20 mg daily.She takes this now.  - last eye exam was in 2023: ? DR (need to get the report).  -  no numbness and tingling in her feet.  Occasional numbness in the right leg and foot.  Foot Exam 04/04/2022.  Pt has FH of DM in mother, MGF.  Thyroid nodules:  Reviewed previous work-up: 06/01/2019: Thyroid ultrasound: Parenchymal Echotexture: Mildly heterogenous Isthmus: 0.4 cm thickness Right lobe: 5 x 2.4 x 2.8 cm Left lobe: 4.5 x 2 x 2.1 cm    Estimated total number of nodules >/= 1 cm: 4   Nodule # 1: Location: Right; Mid Maximum size: 1.6 cm; Other 2 dimensions: 1.5 x 1.4 cm Composition: mixed cystic and solid (1) Echogenicity: hypoechoic (2) *Given size (>/= 1.5 - 2.4 cm) and appearance, a follow-up ultrasound in 1 year should be considered based on TI-RADS criteria. _________________________________________________________   Nodule # 2: Location: Right; Inferior Maximum size: 2.3 cm; Other 2  dimensions: 1.9 x 1.9 cm Composition: solid/almost completely solid (2) Echogenicity: hypoechoic (2)  **Given size (>/= 1.5 cm) and appearance, fine needle aspiration of this moderately suspicious nodule should be considered based on TI-RADS criteria. _________________________________________________   Nodule # 3: Location: Left; Inferior Maximum size: 1.4 cm; Other 2 dimensions: 1.2 x 1.2 cm Composition: solid/almost completely solid (2) Echogenicity: isoechoic (1)  Given size (<1.4 cm) and appearance, this nodule does NOT meet TI-RADS criteria for biopsy or dedicated follow-up. _________________________________________________________   Nodule # 4: Location: Left; Mid Maximum size: 1.8 cm; Other 2 dimensions: 1.6 x 1.3 cm Composition: solid/almost completely solid (2) Echogenicity: hypoechoic (2) **Given size (>/= 1.5 cm) and appearance, fine needle aspiration of this moderately suspicious nodule should be considered based on TI-RADS criteria.   IMPRESSION: 1. Thyromegaly with bilateral nodules. 2. Recommend FNA biopsy of moderately suspicious 1.8 cm mid left AND 2.3 cm inferior right nodules. 3. Recommend annual/biennial ultrasound follow-up of additional nodules as above, until stability x5 years confirmed.  08/05/2019: FNA of the 2 nodules: Clinical History: Right inferior 2.3cm; Other 2 dimensions: 1.9 x 1.9cm,  Solid / almost completely solid, Hypoechoic, TI-RADS total points 4  Specimen Submitted:  A. THYROID, RLP, FINE NEEDLE ASPIRATION:  DIAGNOSIS:  - Consistent with benign follicular nodule (Bethesda category II)  SPECIMEN ADEQUACY:  Satisfactory for evaluation   Clinical History: Left mid 1.8cm; Other 2 dimensions: 1.6 x 1.3cm, Solid  / almost completely solid, Hypoechoic, TI-RADS total points 4  Specimen Submitted:  A. THYROID, LMP, FINE NEEDLE  ASPIRATION:  DIAGNOSIS:  - Consistent with benign follicular nodule (Bethesda category II)  SPECIMEN ADEQUACY:   Satisfactory for evaluation  Thyroid U/S (09/18/2021): Parenchymal Echotexture: Mildly heterogenous Isthmus: 0.5 cm, previously 0.4 cm Right lobe: 6.1 x 2.2 x 2.8 cm, previously 5.0 x 2.4 x 2.8 cm Left lobe: 5.3 x 2.3 x 2.4 cm, previously 4.5 x 2.0 x 2.1 cm _________________________________________________________   Estimated total number of nodules >/= 1 cm: 5 _________________________________________________________   Nodule # 1: Location: Right; Superior Maximum size: 2.0 cm; Other 2 dimensions: 1.7 x 1.7 cm, previously 1.6 x 1.5 x 1.4 cm Composition: mixed cystic and solid (1) Echogenicity: isoechoic (1) This nodule does NOT meet TI-RADS criteria for biopsy or dedicated follow-up. _________________________________________________________   Nodule # 2: Location: Right; Mid Maximum size: 2.9 cm; Other 2 dimensions: 2.2 x 1.7 cm, previously 2.3 x 1.9 x 1.9 cm This nodule is mixed cystic and solid, and was previously biopsied.  _________________________________________________________   Nodule # 3: Location: Right; Inferior Maximum size: 1.5 cm; Other 2 dimensions: 1.4 x 0.9 cm Composition: solid/almost completely solid (2) Echogenicity: isoechoic (1) *Given size (>/=  1.5 - 2.4 cm) and appearance, a follow-up ultrasound in 1 year should be considered based on TI-RADS criteria. _________________________________________________________   Nodule # 4: Location: Left; Mid Maximum size: 2.5 cm; Other 2 dimensions: 2.0 x 1.5 cm, previously 1.8 x 1.6 x 1.2 cm This nodule is mixed cystic and solid, and was previously biopsied. _________________________________________________________   Nodule # 5: Location: Left; Inferior Maximum size: 1.5 cm; Other 2 dimensions: 1.2 x 1.0 cm, previously 1.4 x 1.2 x 1.2 cm Composition: solid/almost completely solid (2) Echogenicity: isoechoic (1) *Given size (>/= 1.5 - 2.4 cm) and appearance, a follow-up ultrasound in 1 year should be  considered based on TI-RADS criteria.  _________________________________________________________   No cervical lymphadenopathy, within the imaged neck   IMPRESSION: 1. Multinodular thyroid gland, with similar appearance of previously-biopsied nodules as above. 2. 1.5 cm bilateral TR 3 nodules. Attention on follow-up.  *Given size (>/= 1.5 - 2.4 cm) and appearance, a follow-up ultrasound in 1 year should be considered based on TI-RADS criteria.  Reviewed her TFTs: Lab Results  Component Value Date   TSH 1.05 12/01/2021   TSH 1.74 10/27/2020   TSH 1.35 05/25/2019   TSH 1.070 07/28/2015   TSH 1.13 10/15/2014   Pt denies: - feeling nodules in neck - hoarseness - dysphagia - choking  She also has a history of PCOS and distant history of epilepsy at 46 years old.  ROS: Signs see HPI  I reviewed pt's medications, allergies, PMH, social hx, family hx, and changes were documented in the history of present illness. Otherwise, unchanged from my initial visit note.  Past Medical History:  Diagnosis Date   Allergic rhinitis    Breast discharge 06/05/2017   2 weeks ago left   Breast mass 12/06/2016   left   COVID-19 virus infection 11/2020   Diabetes mellitus without complication ([place])    Dyslipidemia    Epilepsy ([place])    Febrile seizures ([place])    Galactorrhea    Hx gestational diabetes    Hypertension    Kidney stones    Morbid obesity ([place])    Obesity    Seizures ([place])    Sleep apnea    Syncope and collapse    Tachycardia    Past Surgical History:  Procedure Laterality Date   BREAST BIOPSY Left 2018   benign   CESAREAN SECTION     X 2   IRRIGATION AND DEBRIDEMENT SHOULDER Left 10/12/2020   Procedure: IRRIGATION AND DEBRIDEMENT SHOULDER;  Surgeon: Hiram Gash, MD;  Location: [facility];  Service: Orthopedics;  Laterality: Left;   RIGHT OOPHORECTOMY Right 2001   benign tumor   TUBAL LIGATION  2007   Social History   Socioeconomic History   Marital status: Married     Spouse name: Roderic Palau   Number of children: 2   Years of education: College   Highest education level: Not on file  Occupational History   Occupation: Optometrist  Tobacco Use   Smoking status: Never   Smokeless tobacco: Never  Vaping Use   Vaping Use: Never used  Substance and Sexual Activity   Alcohol use: Yes    Comment: occass   Drug use: No   Sexual activity: Yes    Partners: Male    Birth control/protection: Surgical    Comment: tubial lig  Other Topics Concern   Not on file  Social History Narrative   Not on file   Social Determinants of Health   Financial Resource Strain: Low Risk  (  11/12/2019)   Overall Financial Resource Strain (CARDIA)    Difficulty of Paying Living Expenses: Not hard at all  Food Insecurity: No Food Insecurity (11/12/2019)   Hunger Vital Sign    Worried About Running Out of Food in the Last Year: Never true    Ran Out of Food in the Last Year: Never true  Transportation Needs: No Transportation Needs (11/12/2019)   PRAPARE - Hydrologist (Medical): No    Lack of Transportation (Non-Medical): No  Physical Activity: Inactive (01/29/2019)   Exercise Vital Sign    Days of Exercise per Week: 0 days    Minutes of Exercise per Session: 0 min  Stress: No Stress Concern Present (11/12/2019)   [place]    Feeling of Stress : Not at all  Social Connections: Moderately Isolated (11/12/2019)   Social Connection and Isolation Panel [NHANES]    Frequency of Communication with Friends and Family: More than three times a week    Frequency of Social Gatherings with Friends and Family: More than three times a week    Attends Religious Services: Never    Marine scientist or Organizations: No    Attends Archivist Meetings: Never    Marital Status: Married  Human resources officer Violence: Not At Risk (11/12/2019)   Humiliation, Afraid, Rape, and Kick  questionnaire    Fear of Current or Ex-Partner: No    Emotionally Abused: No    Physically Abused: No    Sexually Abused: No   Meds: Current Outpatient Medications on File Prior to Visit  Medication Sig Dispense Refill   Cholecalciferol (VITAMIN D) 50 MCG (2000 UT) CAPS Take 1 capsule by mouth daily at 12 noon.     clonazePAM (KLONOPIN) 0.5 MG tablet Take 0.5 mg by mouth daily as needed (Seizures).   3   EPINEPHrine 0.3 mg/0.3 mL IJ SOAJ injection Inject 0.3 mg into the muscle as needed for anaphylaxis (for anaphylaxis).      fluticasone (FLONASE) 50 MCG/ACT nasal spray Place 1 spray into both nostrils daily. 16 g 0   gabapentin (NEURONTIN) 100 MG capsule Take 100 mg by mouth 3 (three) times daily.     insulin glargine (LANTUS SOLOSTAR) 100 UNIT/ML Solostar Pen INJECT 56 UNITS INTO THE SKIN DAILY 45 mL 3   Insulin Pen Needle 32G X 4 MM MISC Use 1x a day 100 each 3   metFORMIN (GLUCOPHAGE-XR) 500 MG 24 hr tablet Take 4 tablets (2,000 mg total) by mouth daily with supper. 360 tablet 3   naproxen (NAPROSYN) 500 MG tablet Take 1 tablet (500 mg total) by mouth 2 (two) times daily. 30 tablet 0   omeprazole (PRILOSEC) 40 MG capsule TAKE 1 CAPSULE (40 MG TOTAL) BY MOUTH DAILY BEFORE DINNER 30 capsule 0   OneTouch Delica Lancets 53G MISC Use 2x a day with OneTouch Verio Flex 200 each 3   ONETOUCH VERIO test strip USE 2X A DAY WITH ONETOUCH VERIO FLEX 50 strip 15   oxyCODONE (ROXICODONE) 5 MG immediate release tablet Take 1 tablet (5 mg total) by mouth every 6 (six) hours as needed for severe pain. 10 tablet 0   predniSONE (DELTASONE) 50 MG tablet Take 1 tablet (50 mg total) by mouth daily. 5 tablet 0   rosuvastatin (CRESTOR) 20 MG tablet Take 1 tablet (20 mg total) by mouth daily. 90 tablet 3   tirzepatide (MOUNJARO) 12.5 MG/0.5ML Pen Inject 12.5 mg  into the skin once a week. 6 mL 1   tirzepatide (MOUNJARO) 15 MG/0.5ML Pen Inject 15 mg into the skin once a week. 2 mL 1   tranexamic acid (LYSTEDA)  650 MG TABS tablet Take 2 tablets (1,300 mg total) by mouth 3 (three) times daily. Take during menses for a maximum of five days 30 tablet 2   zonisamide (ZONEGRAN) 100 MG capsule Take 400 mg by mouth at bedtime.      No current facility-administered medications on file prior to visit.   Allergies  Allergen Reactions   Peanuts [Peanut Oil] Anaphylaxis   Aspirin Other (See Comments)    Does not take because of her epilepsy/seizure    Family History  Problem Relation Age of Onset   Diabetes Mother    Breast cancer Paternal Grandmother 64   Cancer Paternal Grandmother    Cancer Paternal Aunt    Cancer Maternal Grandmother    Heart disease Neg Hx    PE: BP 128/84 (BP Location: Left Arm, Patient Position: Sitting, Cuff Size: Normal)   Pulse (!) 101   Ht 5' 7"  (1.702 m)   Wt 279 lb 3.2 oz (126.6 kg)   SpO2 99%   BMI 43.73 kg/m  Wt Readings from Last 3 Encounters:  09/04/22 279 lb 3.2 oz (126.6 kg)  06/26/22 280 lb (127 kg)  04/07/22 280 lb (127 kg)   Constitutional: overweight, in NAD Eyes: EOMI, no exophthalmos ENT: moist mucous membranes, + B thyromegaly, no cervical lymphadenopathy Cardiovascular: tachycardia, RR, No MRG Respiratory: CTA B Musculoskeletal: no deformities Skin: no rashes Neurological: no tremor with outstretched hands  ASSESSMENT: 1. DM2, insulin-dependent, uncontrolled, with complications - DR - MAU  2.  Thyroid nodules  3. HL  4. Obesity class 3  PLAN:  1. Patient with longstanding, uncontrolled, type 2 diabetes, on oral antidiabetic regimen with metformin and also injectable regimen with daily long-acting insulin and weekly GLP-1 receptor agonist, with improved control after starting Mounjaro.  At last visit, sugars remained at goal but they were close to the higher end of the target range.  We continued with the highest dose of Mounjaro, which was tolerated well.  We did discuss that when sugars start to improve more, we can decrease the dose of  her Lantus. -At today's visit, sugars are mostly at goal, with only occasional slight hyperglycemic values.  She was taking a lower dose of Mounjaro, 12.5 mg weekly for 2 months after our last visit due to lack of availability of the higher dose at the pharmacy.  However, she then was able to switch to the 50 mg dose, which she continues today.  I refilled this for her today at [facility].  For now, we will continue the current regimen. - I suggested to:  Patient Instructions  Please continue: - Metformin ER 2000 mg with dinner - Lantus 56 units at bedtime - Mounjaro 15 mg weekly  Please return in 4-6 months with your sugar log.   - we checked her HbA1c: 6.2% (lower) - advised to check sugars at different times of the day - 1x a day, rotating check times - advised for yearly eye exams >> she is UTD - return to clinic in 4-6 months  2.  Thyroid nodules -She denies neck compression symptoms -Latest TSH was normal: Lab Results  Component Value Date   TSH 1.05 12/01/2021  -Reviewed the results of her previous thyroid ultrasound from 05/2019.  At that time, she had 2 nodules biopsied (  07/2019) with benign results. -Thyroid ultrasound repeated in 09/2021 showed stable nodules -will recheck another ultrasound now, a year from the previous  3. HL -Reviewed latest lipid panel from 11/2021: LDL above target, HDL low: Lab Results  Component Value Date   CHOL 210 (H) 12/01/2021   HDL 34.80 (L) 12/01/2021   LDLCALC 148 (H) 12/01/2021   TRIG 133.0 12/01/2021   CHOLHDL 6 12/01/2021  -Continues on Crestor 20 mg daily, tolerating well  4.  Obesity class III -She was previously contemplating gastric sleeve surgery, but after she lost almost 40 pounds on Mounjaro, plans are on hold -Before last visit, she lost 18 pounds and her OSA resolved -We will continue Mounjaro for now - this should also help with both diabetes control but also weight loss -wt stable at this OV  Thyroid U/S  (09/06/2022): Parenchymal Echotexture: Mildly heterogenous  Isthmus: 0.4 cm  Right lobe: 6.0 x 2.3 x 3.1 cm  Left lobe: 5.1 x 2.3 x 2.4 cm  _________________________________________________________   Estimated total number of nodules >/= 1 cm: 5 _________________________________________________________   Nodule # 1:  Location: RIGHT; Superior  Maximum size: 2.1 cm; Other 2 dimensions: 1.9 x 2.5 cm, previously 2.0 x 1.7 x 1.7 cm  Composition: mixed cystic and solid (1) Echogenicity: isoechoic (1)  This nodule does NOT meet TI-RADS criteria for biopsy or dedicated follow-up.  _________________________________________________________   Nodule # 2:  Location: RIGHT; Mid/inferior  Maximum size: 3.1 cm; Other 2 dimensions: 2.5 x 1.9 cm  No aggressive features on today's evaluation. This nodule appears morphologically stable for and was previously biopsied in 08/05/2019. Assuming a benign pathologic diagnosis, repeat sampling and/or dedicated follow-up is not recommended.  _________________________________________________________   Nodule # 3:  Location: RIGHT; Inferior  Maximum size: 1.5 cm; Other 2 dimensions: 1.4 x 1.3 cm, previously 1.5 x 1.4 x 0.9 cm  Composition: solid/almost completely solid (2)  Echogenicity: isoechoic (1) *Given size (>/= 1.5 - 2.4 cm) and appearance, a follow-up ultrasound in 1 year should be considered based on TI-RADS criteria.  _________________________________________________________   Nodule # 4:  Location: LEFT; Mid  Maximum size: 2.6 cm; Other 2 dimensions: 2.2 x 2.2 cm, previously 2.5 x 2.0 x 1.5 cm  No aggressive features on today's evaluation. This nodule appears morphologically stable and was previously biopsied in 08/05/2019. Assuming a benign pathologic diagnosis, repeat sampling and/or dedicated follow-up is not recommended.  _________________________________________________________   Nodule # 5:  Location: LEFT; Inferior  Maximum  size: 1.7 cm; Other 2 dimensions: 1.6 x 1.1 cm, previously 1.5 x 1.2 x 1.0 cm  Composition: solid/almost completely solid (2)  Echogenicity: isoechoic (1) *Given size (>/= 1.5 - 2.4 cm) and appearance, a follow-up ultrasound in 1 year should be considered based on TI-RADS criteria.  _________________________________________________________   No cervical adenopathy or abnormal fluid collection within the imaged neck.   IMPRESSION: 1. Multinodular thyroid gland. 2. 1.5 cm bilateral inferior TR-3 thyroid nodules. A follow-up ultrasound in 1 year should be considered based on TI-RADS criteria. 3. Additional smaller and previously-biopsied nodules, as described above, without follow-up nor biopsy indicated per current criteria.   Philemon Kingdom, MD PhD [facility] Endocrinology

## 2022-09-04 NOTE — Patient Instructions (Addendum)
Please continue: - Metformin ER 2000 mg with dinner - Lantus 56 units at bedtime - Mounjaro 15 mg weekly  Please return in 4-6 months with your sugar log.

## 2022-09-05 ENCOUNTER — Encounter: Payer: Self-pay | Admitting: Family Medicine

## 2022-09-05 ENCOUNTER — Other Ambulatory Visit: Payer: Self-pay

## 2022-09-05 DIAGNOSIS — Z1211 Encounter for screening for malignant neoplasm of colon: Secondary | ICD-10-CM

## 2022-09-06 ENCOUNTER — Ambulatory Visit
Admission: RE | Admit: 2022-09-06 | Discharge: 2022-09-06 | Disposition: A | Discharge status: Home / Self Care | Payer: 59 | Source: Ambulatory Visit | Attending: Internal Medicine | Admitting: Internal Medicine

## 2022-09-06 DIAGNOSIS — E042 Nontoxic multinodular goiter: Secondary | ICD-10-CM

## 2022-09-07 ENCOUNTER — Telehealth: Payer: Self-pay

## 2022-09-07 ENCOUNTER — Other Ambulatory Visit: Payer: Self-pay

## 2022-09-07 DIAGNOSIS — Z86010 Personal history of colonic polyps: Secondary | ICD-10-CM

## 2022-09-07 MED ORDER — NA SULFATE-K SULFATE-MG SULF 17.5-3.13-1.6 GM/177ML PO SOLN
354.0000 mL | Freq: Once | ORAL | 0 refills | Status: AC
Start: 2022-09-07 — End: 2022-09-07

## 2022-09-07 NOTE — Telephone Encounter (Signed)
Gastroenterology Pre-Procedure Review  Request Date: 09/26/2022 Requesting Physician: Dr. Marius Ditch  PATIENT REVIEW QUESTIONS: The patient responded to the following health history questions as indicated:    1. Are you having any GI issues? Has constant colonoscopy  2. Do you have a personal history of Polyps? Yes Had colon polyps removed 7 years ago  3. Do you have a family history of Colon Cancer or Polyps? no 4. Diabetes Mellitus? Yes Metformin and Monjaro 5. Joint replacements in the past 12 months?no 6. Major health problems in the past 3 months?no 7. Any artificial heart valves, MVP, or defibrillator?no    MEDICATIONS & ALLERGIES:    Patient reports the following regarding taking any anticoagulation/antiplatelet therapy:   Plavix, Coumadin, Eliquis, Xarelto, Lovenox, Pradaxa, Brilinta, or Effient? no Aspirin? no  Patient confirms/reports the following medications:  Current Outpatient Medications  Medication Sig Dispense Refill   Cholecalciferol (VITAMIN D) 50 MCG (2000 UT) CAPS Take 1 capsule by mouth daily at 12 noon.     clonazePAM (KLONOPIN) 0.5 MG tablet Take 0.5 mg by mouth daily as needed (Seizures).   3   EPINEPHrine 0.3 mg/0.3 mL IJ SOAJ injection Inject 0.3 mg into the muscle as needed for anaphylaxis (for anaphylaxis).      fluticasone (FLONASE) 50 MCG/ACT nasal spray Place 1 spray into both nostrils daily. 16 g 0   gabapentin (NEURONTIN) 100 MG capsule Take 100 mg by mouth 3 (three) times daily.     insulin glargine (LANTUS SOLOSTAR) 100 UNIT/ML Solostar Pen INJECT 56 UNITS INTO THE SKIN DAILY 45 mL 3   Insulin Pen Needle 32G X 4 MM MISC Use 1x a day 100 each 3   metFORMIN (GLUCOPHAGE-XR) 500 MG 24 hr tablet Take 4 tablets (2,000 mg total) by mouth daily with supper. 360 tablet 3   naproxen (NAPROSYN) 500 MG tablet Take 1 tablet (500 mg total) by mouth 2 (two) times daily. 30 tablet 0   omeprazole (PRILOSEC) 40 MG capsule TAKE 1 CAPSULE (40 MG TOTAL) BY MOUTH DAILY BEFORE  DINNER 30 capsule 0   OneTouch Delica Lancets 40N MISC Use 2x a day with OneTouch Verio Flex 200 each 3   ONETOUCH VERIO test strip USE 2X A DAY WITH ONETOUCH VERIO FLEX 50 strip 15   oxyCODONE (ROXICODONE) 5 MG immediate release tablet Take 1 tablet (5 mg total) by mouth every 6 (six) hours as needed for severe pain. 10 tablet 0   predniSONE (DELTASONE) 50 MG tablet Take 1 tablet (50 mg total) by mouth daily. 5 tablet 0   rosuvastatin (CRESTOR) 20 MG tablet Take 1 tablet (20 mg total) by mouth daily. 90 tablet 3   tirzepatide (MOUNJARO) 12.5 MG/0.5ML Pen Inject 12.5 mg into the skin once a week. 6 mL 1   tirzepatide (MOUNJARO) 15 MG/0.5ML Pen Inject 15 mg into the skin once a week. 6 mL 3   tranexamic acid (LYSTEDA) 650 MG TABS tablet Take 2 tablets (1,300 mg total) by mouth 3 (three) times daily. Take during menses for a maximum of five days 30 tablet 2   zonisamide (ZONEGRAN) 100 MG capsule Take 400 mg by mouth at bedtime.      No current facility-administered medications for this visit.    Patient confirms/reports the following allergies:  Allergies  Allergen Reactions   Peanuts [Peanut Oil] Anaphylaxis   Aspirin Other (See Comments)    Does not take because of her epilepsy/seizure     No orders of the defined types were placed  in this encounter.   AUTHORIZATION INFORMATION Primary Insurance: 1D#: Group #:  Secondary Insurance: 1D#: Group #:  SCHEDULE INFORMATION: Date:  Time: Location:

## 2022-09-20 ENCOUNTER — Other Ambulatory Visit: Payer: Self-pay | Admitting: Internal Medicine

## 2022-09-25 ENCOUNTER — Encounter: Payer: Self-pay | Admitting: Gastroenterology

## 2022-09-26 ENCOUNTER — Ambulatory Visit: Payer: 59 | Admitting: Anesthesiology

## 2022-09-26 ENCOUNTER — Other Ambulatory Visit: Payer: Self-pay

## 2022-09-26 ENCOUNTER — Encounter
Admission: RE | Disposition: A | Discharge status: Home / Self Care | Payer: Self-pay | Source: Home / Self Care | Attending: Gastroenterology

## 2022-09-26 ENCOUNTER — Ambulatory Visit
Admission: RE | Admit: 2022-09-26 | Discharge: 2022-09-26 | Disposition: A | Discharge status: Home / Self Care | Payer: 59 | Attending: Gastroenterology | Admitting: Gastroenterology

## 2022-09-26 ENCOUNTER — Encounter: Payer: Self-pay | Admitting: Gastroenterology

## 2022-09-26 DIAGNOSIS — Z7984 Long term (current) use of oral hypoglycemic drugs: Secondary | ICD-10-CM | POA: Insufficient documentation

## 2022-09-26 DIAGNOSIS — G40909 Epilepsy, unspecified, not intractable, without status epilepticus: Secondary | ICD-10-CM | POA: Diagnosis not present

## 2022-09-26 DIAGNOSIS — F418 Other specified anxiety disorders: Secondary | ICD-10-CM | POA: Insufficient documentation

## 2022-09-26 DIAGNOSIS — Z794 Long term (current) use of insulin: Secondary | ICD-10-CM | POA: Insufficient documentation

## 2022-09-26 DIAGNOSIS — Z1211 Encounter for screening for malignant neoplasm of colon: Secondary | ICD-10-CM | POA: Insufficient documentation

## 2022-09-26 DIAGNOSIS — G4730 Sleep apnea, unspecified: Secondary | ICD-10-CM | POA: Insufficient documentation

## 2022-09-26 DIAGNOSIS — E119 Type 2 diabetes mellitus without complications: Secondary | ICD-10-CM | POA: Diagnosis not present

## 2022-09-26 DIAGNOSIS — I10 Essential (primary) hypertension: Secondary | ICD-10-CM | POA: Diagnosis not present

## 2022-09-26 DIAGNOSIS — K219 Gastro-esophageal reflux disease without esophagitis: Secondary | ICD-10-CM | POA: Insufficient documentation

## 2022-09-26 DIAGNOSIS — Z6841 Body Mass Index (BMI) 40.0 and over, adult: Secondary | ICD-10-CM | POA: Insufficient documentation

## 2022-09-26 DIAGNOSIS — Z86010 Personal history of colonic polyps: Secondary | ICD-10-CM

## 2022-09-26 DIAGNOSIS — Z01419 Encounter for gynecological examination (general) (routine) without abnormal findings: Secondary | ICD-10-CM

## 2022-09-26 HISTORY — PX: COLONOSCOPY WITH PROPOFOL: SHX5780

## 2022-09-26 LAB — GLUCOSE, CAPILLARY: Glucose-Capillary: 150 mg/dL — ABNORMAL HIGH (ref 70–99)

## 2022-09-26 SURGERY — COLONOSCOPY WITH PROPOFOL
Anesthesia: General

## 2022-09-26 MED ORDER — LIDOCAINE HCL (PF) 2 % IJ SOLN
INTRAMUSCULAR | Status: AC
  Filled 2022-09-26: qty 5

## 2022-09-26 MED ORDER — DEXMEDETOMIDINE HCL IN NACL 80 MCG/20ML IV SOLN
INTRAVENOUS | Status: DC | PRN
  Administered 2022-09-26: 8 ug via BUCCAL

## 2022-09-26 MED ORDER — PROPOFOL 1000 MG/100ML IV EMUL
INTRAVENOUS | Status: AC
  Filled 2022-09-26: qty 100

## 2022-09-26 MED ORDER — LIDOCAINE HCL (CARDIAC) PF 100 MG/5ML IV SOSY
PREFILLED_SYRINGE | INTRAVENOUS | Status: DC | PRN
  Administered 2022-09-26: 50 mg via INTRAVENOUS

## 2022-09-26 MED ORDER — PROPOFOL 500 MG/50ML IV EMUL
INTRAVENOUS | Status: DC | PRN
  Administered 2022-09-26: 150 ug/kg/min via INTRAVENOUS

## 2022-09-26 MED ORDER — SODIUM CHLORIDE 0.9 % IV SOLN: INTRAVENOUS | Status: DC

## 2022-09-26 MED ORDER — PROPOFOL 10 MG/ML IV BOLUS
INTRAVENOUS | Status: DC | PRN
  Administered 2022-09-26: 70 mg via INTRAVENOUS
  Administered 2022-09-26: 30 mg via INTRAVENOUS

## 2022-09-26 NOTE — Transfer of Care (Signed)
Immediate Anesthesia Transfer of Care Note  Patient: Erin Good  Procedure(s) Performed: COLONOSCOPY WITH PROPOFOL  Patient Location: Endoscopy Unit  Anesthesia Type:General  Level of Consciousness: awake, alert , and oriented  Airway & Oxygen Therapy: Patient Spontanous Breathing  Post-op Assessment: Report given to RN and Post -op Vital signs reviewed and stable  Post vital signs: Reviewed and stable  Last Vitals:  Vitals Value Taken Time  BP 96/58 09/26/22 0927  Temp 35.6 C 09/26/22 0927  Pulse 84 09/26/22 0927  Resp 21 09/26/22 0927  SpO2 100 % 09/26/22 0927    Last Pain:  Vitals:   09/26/22 0927  TempSrc: Temporal  PainSc: Asleep         Complications: No notable events documented.

## 2022-09-26 NOTE — Anesthesia Procedure Notes (Addendum)
Procedure Name: MAC Date/Time: 09/26/2022 8:53 AM  Performed by: Tollie Eth, CRNAPre-anesthesia Checklist: Patient identified, Emergency Drugs available, Suction available and Patient being monitored Patient Re-evaluated:Patient Re-evaluated prior to induction Oxygen Delivery Method: Simple face mask Induction Type: IV induction Placement Confirmation: positive ETCO2

## 2022-09-26 NOTE — Op Note (Signed)
[facility] Gastroenterology Patient Name: Erin Good Procedure Date: 09/26/2022 8:51 AM MRN: [id number] Account #: [id number] Date of Birth: 1976-06-28 Admit Type: Outpatient Age: 46 Room: [facility] ENDO ROOM 4 Gender: Female Note Status: Finalized Instrument Name: Park Meo [id number] Procedure:             Colonoscopy Indications:           Screening for colorectal malignant neoplasm Providers:             Lin Landsman MD, MD Medicines:             General Anesthesia Complications:         No immediate complications. Estimated blood loss: None. Procedure:             Pre-Anesthesia Assessment:                        - Prior to the procedure, a History and Physical was                         performed, and patient medications and allergies were                         reviewed. The patient is competent. The risks and                         benefits of the procedure and the sedation options and                         risks were discussed with the patient. All questions                         were answered and informed consent was obtained.                         Patient identification and proposed procedure were                         verified by the physician, the nurse, the                         anesthesiologist, the anesthetist and the technician                         in the pre-procedure area in the procedure room in the                         endoscopy suite. Mental Status Examination: alert and                         oriented. Airway Examination: normal oropharyngeal                         airway and neck mobility. Respiratory Examination:                         clear to auscultation. CV Examination: normal.                         Prophylactic Antibiotics: The  patient does not require                         prophylactic antibiotics. Prior Anticoagulants: The                         patient has taken no anticoagulant or antiplatelet                          agents. ASA Grade Assessment: III - A patient with                         severe systemic disease. After reviewing the risks and                         benefits, the patient was deemed in satisfactory                         condition to undergo the procedure. The anesthesia                         plan was to use general anesthesia. Immediately prior                         to administration of medications, the patient was                         re-assessed for adequacy to receive sedatives. The                         heart rate, respiratory rate, oxygen saturations,                         blood pressure, adequacy of pulmonary ventilation, and                         response to care were monitored throughout the                         procedure. The physical status of the patient was                         re-assessed after the procedure.                        After obtaining informed consent, the colonoscope was                         passed under direct vision. Throughout the procedure,                         the patient's blood pressure, pulse, and oxygen                         saturations were monitored continuously. The                         Colonoscope was introduced through the anus and  advanced to the the cecum, identified by appendiceal                         orifice and ileocecal valve. The colonoscopy was                         performed with moderate difficulty due to inadequate                         bowel prep. Successful completion of the procedure was                         aided by lavage. The patient tolerated the procedure                         well. The quality of the bowel preparation was poor.                         The ileocecal valve, appendiceal orifice, and rectum                         were photographed. Findings:      The perianal and digital rectal examinations were normal. Pertinent        negatives include normal sphincter tone and no palpable rectal lesions.      Normal mucosa was found in the entire colon.      Copious quantities of semi-liquid stool was found in the entire colon,       precluding visualization.      The retroflexed view of the distal rectum and anal verge was normal and       showed no anal or rectal abnormalities. Impression:            - Preparation of the colon was poor.                        - Normal mucosa in the entire examined colon.                        - Stool in the entire examined colon.                        - The distal rectum and anal verge are normal on                         retroflexion view.                        - No specimens collected. Recommendation:        - Discharge patient to home (with escort).                        - Resume previous diet today.                        - Continue present medications.                        - Repeat colonoscopy in 6 months with 2 day prep  because the bowel preparation was poor. Procedure Code(s):     --- Professional ---                        M1586, Colorectal cancer screening; colonoscopy on                         individual not meeting criteria for high risk Diagnosis Code(s):     --- Professional ---                        Z12.11, Encounter for screening for malignant neoplasm                         of colon CPT copyright 2022 American Medical Association. All rights reserved. The codes documented in this report are preliminary and upon coder review may  be revised to meet current compliance requirements. Dr. Ulyess Mort Lin Landsman MD, MD 09/26/2022 9:26:11 AM This report has been signed electronically. Number of Addenda: 0 Note Initiated On: 09/26/2022 8:51 AM Scope Withdrawal Time: 0 hours 8 minutes 4 seconds  Total Procedure Duration: 0 hours 11 minutes 41 seconds  Estimated Blood Loss:  Estimated blood loss: none.      [facility]

## 2022-09-26 NOTE — Anesthesia Preprocedure Evaluation (Signed)
Anesthesia Evaluation  Patient identified by MRN, date of birth, ID band Patient awake    Reviewed: Allergy & Precautions, NPO status , Patient's Chart, lab work & pertinent test results  Airway Mallampati: II  TM Distance: >3 FB Neck ROM: Full    Dental no notable dental hx. (+) Teeth Intact, Dental Advisory Given   Pulmonary sleep apnea (does not use CPAP)    Pulmonary exam normal breath sounds clear to auscultation       Cardiovascular hypertension, Pt. on medications and Pt. on home beta blockers Normal cardiovascular exam Rhythm:Regular Rate:Normal     Neuro/Psych Seizures -, Well Controlled,  PSYCHIATRIC DISORDERS Anxiety Depression     Neuromuscular disease    GI/Hepatic ,GERD  Controlled,,(+) Hepatitis -  Endo/Other  diabetes, Oral Hypoglycemic Agents, Insulin Dependent  Morbid obesity (BMI 49)  Renal/GU Renal disease  negative genitourinary   Musculoskeletal negative musculoskeletal ROS (+)    Abdominal   Peds  Hematology negative hematology ROS (+)   Anesthesia Other Findings   Reproductive/Obstetrics                             Anesthesia Physical Anesthesia Plan  ASA: 3  Anesthesia Plan: General   Post-op Pain Management:  Regional for Post-op pain   Induction: Intravenous  PONV Risk Score and Plan: 3 and Ondansetron, TIVA and Propofol infusion  Airway Management Planned: Natural Airway  Additional Equipment:   Intra-op Plan:   Post-operative Plan:   Informed Consent: I have reviewed the patients History and Physical, chart, labs and discussed the procedure including the risks, benefits and alternatives for the proposed anesthesia with the patient or authorized representative who has indicated his/her understanding and acceptance.     Dental advisory given  Plan Discussed with: CRNA  Anesthesia Plan Comments: (IVGA, PIV x 1, ASA SM, RBO discussed, Consent  signed.)        Anesthesia Quick Evaluation

## 2022-09-26 NOTE — H&P (Signed)
Cephas Darby, MD [address]  [place]  [place], [place] [address]  Main: [phone]  Fax: [phone] Pager: [phone]  Primary Care Physician:  Steele Sizer, MD Primary Gastroenterologist:  Dr. Cephas Darby  Pre-Procedure History & Physical: HPI:  Erin Good is a 46 y.o. female is here for an colonoscopy.   Past Medical History:  Diagnosis Date   Allergic rhinitis    Breast discharge 06/05/2017   2 weeks ago left   Breast mass 12/06/2016   left   COVID-19 virus infection 11/2020   Diabetes mellitus without complication ([place])    Dyslipidemia    Epilepsy ([place])    Febrile seizures ([place])    Galactorrhea    Hx gestational diabetes    Hypertension    Kidney stones    Morbid obesity ([place])    Obesity    Seizures ([place])    Sleep apnea    Syncope and collapse    Tachycardia     Past Surgical History:  Procedure Laterality Date   BREAST BIOPSY Left 2018   benign   CESAREAN SECTION     X 2   IRRIGATION AND DEBRIDEMENT SHOULDER Left 10/12/2020   Procedure: IRRIGATION AND DEBRIDEMENT SHOULDER;  Surgeon: Hiram Gash, MD;  Location: [facility] ORS;  Service: Orthopedics;  Laterality: Left;   RIGHT OOPHORECTOMY Right 2001   benign tumor   TUBAL LIGATION  2007    Prior to Admission medications   Medication Sig Start Date End Date Taking? Authorizing Provider  zonisamide (ZONEGRAN) 100 MG capsule Take 400 mg by mouth at bedtime.    Yes Roque Cash, MD  Cholecalciferol (VITAMIN D) 50 MCG (2000 UT) CAPS Take 1 capsule by mouth daily at 12 noon.    [provider]  clonazePAM (KLONOPIN) 0.5 MG tablet Take 0.5 mg by mouth daily as needed (Seizures).  08/14/16   [provider]  EPINEPHrine 0.3 mg/0.3 mL IJ SOAJ injection Inject 0.3 mg into the muscle as needed for anaphylaxis (for anaphylaxis).  06/14/14   [provider]  fluticasone (FLONASE) 50 MCG/ACT nasal spray Place 1 spray into both nostrils daily. 11/19/20   Hall-Potvin,  [place], PA-C  gabapentin (NEURONTIN) 100 MG capsule Take 100 mg by mouth 3 (three) times daily. 03/13/22   [provider]  insulin glargine (LANTUS SOLOSTAR) 100 UNIT/ML Solostar Pen INJECT 56 UNITS INTO THE SKIN DAILY 08/25/21   Philemon Kingdom, MD  Insulin Pen Needle 32G X 4 MM MISC Use 1x a day 07/03/19   Philemon Kingdom, MD  metFORMIN (GLUCOPHAGE-XR) 500 MG 24 hr tablet TAKE 4 TABLETS (2,000 MG TOTAL) BY MOUTH DAILY WITH SUPPER. 09/20/22   Philemon Kingdom, MD  naproxen (NAPROSYN) 500 MG tablet Take 1 tablet (500 mg total) by mouth 2 (two) times daily. 06/07/14   Delora Fuel, MD  omeprazole (PRILOSEC) 40 MG capsule TAKE 1 CAPSULE (40 MG TOTAL) BY MOUTH DAILY BEFORE DINNER 07/27/22   Steele Sizer, MD  OneTouch Delica Lancets 17O MISC Use 2x a day with OneTouch Verio Flex 07/03/19   Philemon Kingdom, MD  [facility] VERIO test strip USE 2X A DAY WITH ONETOUCH VERIO FLEX 10/02/21   Philemon Kingdom, MD  oxyCODONE (ROXICODONE) 5 MG immediate release tablet Take 1 tablet (5 mg total) by mouth every 6 (six) hours as needed for severe pain. 04/09/22   Horton, Alvin Critchley, DO  predniSONE (DELTASONE) 50 MG tablet Take 1 tablet (50 mg total) by mouth daily. 11/10/05   Roxanne Mins,  Shanon Brow, MD  rosuvastatin (CRESTOR) 20 MG tablet Take 1 tablet (20 mg total) by mouth daily. 10/31/20   Philemon Kingdom, MD  tirzepatide [facility]) 12.5 MG/0.5ML Pen Inject 12.5 mg into the skin once a week. 02/05/22   Philemon Kingdom, MD  tirzepatide [facility]) 15 MG/0.5ML Pen Inject 15 mg into the skin once a week. 09/04/22   Philemon Kingdom, MD  tranexamic acid (LYSTEDA) 650 MG TABS tablet Take 2 tablets (1,300 mg total) by mouth 3 (three) times daily. Take during menses for a maximum of five days 12/29/21   [name], MD    Allergies as of 09/07/2022 - Review Complete 09/04/2022  Allergen Reaction Noted   Peanuts [peanut oil] Anaphylaxis 04/20/2015   Aspirin Other (See Comments) 03/04/2012    Family History   Problem Relation Age of Onset   Diabetes Mother    Breast cancer Paternal Grandmother 30   Cancer Paternal Grandmother    Cancer Paternal Aunt    Cancer Maternal Grandmother    Heart disease Neg Hx     Social History   Socioeconomic History   Marital status: Married    Spouse name: Roderic Palau   Number of children: 2   Years of education: College   Highest education level: Not on file  Occupational History   Occupation: Optometrist  Tobacco Use   Smoking status: Never   Smokeless tobacco: Never  Vaping Use   Vaping Use: Never used  Substance and Sexual Activity   Alcohol use: Not Currently    Comment: occass   Drug use: No   Sexual activity: Yes    Partners: Male    Birth control/protection: Surgical    Comment: tubial lig  Other Topics Concern   Not on file  Social History Narrative   Not on file   Social Determinants of Health   Financial Resource Strain: Low Risk  (11/12/2019)   Overall Financial Resource Strain (CARDIA)    Difficulty of Paying Living Expenses: Not hard at all  Food Insecurity: No Food Insecurity (11/12/2019)   Hunger Vital Sign    Worried About Running Out of Food in the Last Year: Never true    [place] in the Last Year: Never true  Transportation Needs: No Transportation Needs (11/12/2019)   PRAPARE - Hydrologist (Medical): No    Lack of Transportation (Non-Medical): No  Physical Activity: Inactive (01/29/2019)   Exercise Vital Sign    Days of Exercise per Week: 0 days    Minutes of Exercise per Session: 0 min  Stress: No Stress Concern Present (11/12/2019)   [place]    Feeling of Stress : Not at all  Social Connections: Moderately Isolated (11/12/2019)   Social Connection and Isolation Panel [NHANES]    Frequency of Communication with Friends and Family: More than three times a week    Frequency of Social Gatherings with Friends and Family:  More than three times a week    Attends Religious Services: Never    Marine scientist or Organizations: No    Attends Archivist Meetings: Never    Marital Status: Married  Human resources officer Violence: Not At Risk (11/12/2019)   Humiliation, Afraid, Rape, and Kick questionnaire    Fear of Current or Ex-Partner: No    Emotionally Abused: No    Physically Abused: No    Sexually Abused: No    Review of Systems: See HPI, otherwise  negative ROS  Physical Exam: BP 118/76   Pulse 94   Temp (!) 96.6 F (35.9 C) (Temporal)   Resp 16   Ht 5' 6"  (1.676 m)   Wt 122.9 kg   SpO2 97%   BMI 43.74 kg/m  General:   Alert,  pleasant and cooperative in NAD Head:  Normocephalic and atraumatic. Neck:  Supple; no masses or thyromegaly. Lungs:  Clear throughout to auscultation.    Heart:  Regular rate and rhythm. Abdomen:  Soft, nontender and nondistended. Normal bowel sounds, without guarding, and without rebound.   Neurologic:  Alert and  oriented x4;  grossly normal neurologically.  Impression/Plan: Erin Good is here for an colonoscopy to be performed for colon cancer screening  Risks, benefits, limitations, and alternatives regarding  colonoscopy have been reviewed with the patient.  Questions have been answered.  All parties agreeable.   Sherri Sear, MD  09/26/2022, 8:53 AM

## 2022-09-26 NOTE — Anesthesia Postprocedure Evaluation (Signed)
Anesthesia Post Note  Patient: Erin Good  Procedure(s) Performed: COLONOSCOPY WITH PROPOFOL  Patient location during evaluation: PACU Anesthesia Type: General Level of consciousness: awake and alert Pain management: pain level controlled Vital Signs Assessment: post-procedure vital signs reviewed and stable Respiratory status: spontaneous breathing, nonlabored ventilation, respiratory function stable and patient connected to nasal cannula oxygen Cardiovascular status: stable and blood pressure returned to baseline Postop Assessment: no apparent nausea or vomiting Anesthetic complications: no   No notable events documented.   Last Vitals:  Vitals:   09/26/22 0840 09/26/22 0927  BP: 118/76 (!) 96/58  Pulse: 94 84  Resp: 16 (!) 21  Temp: (!) 35.9 C (!) 35.6 C  SpO2: 97% 100%    Last Pain:  Vitals:   09/26/22 0937  TempSrc:   PainSc: 0-No pain                 Milinda Pointer

## 2022-11-06 ENCOUNTER — Other Ambulatory Visit: Payer: Self-pay

## 2022-11-06 DIAGNOSIS — R0789 Other chest pain: Secondary | ICD-10-CM

## 2022-11-06 MED ORDER — OMEPRAZOLE 40 MG PO CPDR: 40.0000 mg | DELAYED_RELEASE_CAPSULE | Freq: Every day | ORAL | 0 refills | Status: DC

## 2022-12-04 ENCOUNTER — Other Ambulatory Visit: Payer: Self-pay

## 2022-12-04 DIAGNOSIS — R0789 Other chest pain: Secondary | ICD-10-CM

## 2022-12-05 MED ORDER — OMEPRAZOLE 40 MG PO CPDR: 40.0000 mg | DELAYED_RELEASE_CAPSULE | Freq: Every day | ORAL | 0 refills | Status: DC

## 2022-12-07 ENCOUNTER — Other Ambulatory Visit: Payer: Self-pay

## 2022-12-07 DIAGNOSIS — R0789 Other chest pain: Secondary | ICD-10-CM

## 2022-12-16 ENCOUNTER — Other Ambulatory Visit: Payer: Self-pay

## 2022-12-16 ENCOUNTER — Ambulatory Visit
Admission: RE | Admit: 2022-12-16 | Discharge: 2022-12-16 | Disposition: A | Discharge status: Home / Self Care | Payer: 59 | Source: Ambulatory Visit

## 2022-12-16 VITALS — BP 122/83 | HR 110 | Temp 98.6°F | Resp 20 | Ht 67.0 in | Wt 270.0 lb

## 2022-12-16 DIAGNOSIS — J101 Influenza due to other identified influenza virus with other respiratory manifestations: Secondary | ICD-10-CM

## 2022-12-16 DIAGNOSIS — M542 Cervicalgia: Secondary | ICD-10-CM | POA: Diagnosis not present

## 2022-12-16 LAB — POCT INFLUENZA A/B
Influenza A, POC: NEGATIVE
Influenza B, POC: POSITIVE — AB

## 2022-12-16 LAB — POC SARS CORONAVIRUS 2 AG -  ED: SARS Coronavirus 2 Ag: NEGATIVE

## 2022-12-16 MED ORDER — XOFLUZA (80 MG DOSE) 1 X 80 MG PO TBPK: 1.0000 | ORAL_TABLET | Freq: Once | ORAL | 0 refills | Status: AC

## 2022-12-16 MED ORDER — BACLOFEN 10 MG PO TABS: 10.0000 mg | ORAL_TABLET | Freq: Three times a day (TID) | ORAL | 0 refills | Status: DC

## 2022-12-16 MED ORDER — IBUPROFEN 600 MG PO TABS
600.0000 mg | ORAL_TABLET | Freq: Once | ORAL | Status: AC
  Administered 2022-12-16: 600 mg via ORAL

## 2022-12-16 NOTE — Discharge Instructions (Addendum)
You are positive for influenza. Please start taking the treatment as soon as possible, a single tablet of Xofluza. This will help prevent worsening symptoms, and shorten the course of the flu. Please take OTC oscillococcinum which helps with body aches and fatigue. Please alternate Tylenol and ibuprofen as needed for aches and fever. Rest.  Drink plenty of water. You are considered contagious until 24 hours after your fever breaks.   Additionally, you are having tension to your scalene muscles in your neck. Please purchase a microwavable heat pack and apply to your neck several times daily to help release the tension. I have called in a muscle relaxer that you can take up to 3 times daily.  Take with caution as this may make you feel slightly drowsy.  Do not operate heavy machinery or drive a car after taking.

## 2022-12-16 NOTE — ED Triage Notes (Signed)
Pt presents to Urgent Care with c/o cough, nasal congestion, fatigue, fever, and neck pain x 2 days. Has not tested for COVID. Erin Good is positive for flu.

## 2022-12-16 NOTE — ED Provider Notes (Signed)
[name]    CSN: [id number] Arrival date & time: 12/16/22  1132      History   Chief Complaint Chief Complaint  Patient presents with   Nasal Congestion   Fever   Cough   12:00 appt    HPI Erin Good is a 47 y.o. female.   Pleasant 47 year old female presents today due to concern of cough, fever, nasal congestion, body aches, HA.  States symptoms started mid afternoon on Friday.  She is concerned about possible flu.  She took care of her godson 1 week ago who tested positive for the flu on Wednesday.  Patient's symptoms started 2 days after her godson tested positive for the flu.  Additionally, patient states she has had left-sided neck pain.  She feels like it is tender and the muscles are tight.  She denies nuchal rigidity.  No photophobia.  She took Tylenol last evening and felt it was ineffective.  She has been alternating with ibuprofen as well for her fever and states the fever broke, last noted Friday evening. She has continued to have hot/cold chills, and was sweating at one point but feels this also improved some.    Fever Associated symptoms: cough   Cough Associated symptoms: fever     Past Medical History:  Diagnosis Date   Allergic rhinitis    Breast discharge 06/05/2017   2 weeks ago left   Breast mass 12/06/2016   left   COVID-19 virus infection 11/2020   Diabetes mellitus without complication ([place])    Dyslipidemia    Epilepsy ([place])    Febrile seizures ([place])    Galactorrhea    Hx gestational diabetes    Hypertension    Kidney stones    Morbid obesity ([place])    Obesity    Seizures ([place])    Sleep apnea    Syncope and collapse    Tachycardia     Patient Active Problem List   Diagnosis Date Noted   Colon cancer screening 09/26/2022   Diabetes mellitus without complication ([place])    Morbid obesity ([place])    Hypertension    COVID-19 virus infection 11/2020   Impingement syndrome of left shoulder region 04/20/2020   Vitamin  D deficiency 01/31/2017   Right shoulder tendinitis 01/31/2017   Tendinosis 01/31/2017   Bell's palsy 12/19/2016   Tachycardia 09/30/2016   Depression with anxiety 05/06/2015   Allergic rhinitis 04/20/2015   Anxiety and depression 04/20/2015   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   Gastro-esophageal reflux disease without esophagitis 99991111   Dysmetabolic syndrome 99991111   NASH (nonalcoholic steatohepatitis) 04/20/2015   Allergy to nuts 04/20/2015   Calculus of kidney 04/20/2015   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   Central sleep apnea 11/26/2008   Dyslipidemia 07/02/2008   Leukocytosis 07/29/2007    Past Surgical History:  Procedure Laterality Date   BREAST BIOPSY Left 2018   benign   CESAREAN SECTION     X 2   COLONOSCOPY WITH PROPOFOL N/A 09/26/2022   Procedure: COLONOSCOPY WITH PROPOFOL;  Surgeon: Lin Landsman, MD;  Location: [facility];  Service: Gastroenterology;  Laterality: N/A;   IRRIGATION AND DEBRIDEMENT SHOULDER Left 10/12/2020   Procedure: IRRIGATION AND DEBRIDEMENT SHOULDER;  Surgeon: Hiram Gash, MD;  Location: [facility];  Service: Orthopedics;  Laterality: Left;   RIGHT OOPHORECTOMY Right 2001   benign tumor   TUBAL LIGATION  2007    OB History  Gravida  2   Para  2   Term      Preterm  2   AB      Living  2      SAB      IAB      Ectopic      Multiple      Live Births  2        Obstetric Comments  Menstrual age: 40  Age 1st Pregnancy: 79            Home Medications    Prior to Admission medications   Medication Sig Start Date End Date Taking? Authorizing Provider  acetaminophen (TYLENOL) 325 MG tablet Take 650 mg by mouth every 6 (six) hours as needed.   Yes [provider]  baclofen (LIORESAL) 10 MG tablet Take 1 tablet (10 mg total) by mouth 3 (three) times daily. 12/16/22  Yes Brenya Taulbee L, PA  Baloxavir Marboxil,80 MG Dose, (XOFLUZA, 80 MG  DOSE,) 1 x 80 MG TBPK Take 1 tablet by mouth once for 1 dose. 12/16/22 12/16/22 Yes Teancum Brule L, PA  dextromethorphan-guaiFENesin (MUCINEX DM) 30-600 MG 12hr tablet Take 1 tablet by mouth 2 (two) times daily.   Yes [provider]  ibuprofen (ADVIL) 200 MG tablet Take 200 mg by mouth every 6 (six) hours as needed.   Yes [provider]  Cholecalciferol (VITAMIN D) 50 MCG (2000 UT) CAPS Take 1 capsule by mouth daily at 12 noon.    [provider]  clonazePAM (KLONOPIN) 0.5 MG tablet Take 0.5 mg by mouth daily as needed (Seizures).  08/14/16   [provider]  EPINEPHrine 0.3 mg/0.3 mL IJ SOAJ injection Inject 0.3 mg into the muscle as needed for anaphylaxis (for anaphylaxis).  06/14/14   [provider]  fluticasone (FLONASE) 50 MCG/ACT nasal spray Place 1 spray into both nostrils daily. 11/19/20   Hall-Potvin, [place], PA-C  insulin glargine (LANTUS SOLOSTAR) 100 UNIT/ML Solostar Pen INJECT 56 UNITS INTO THE SKIN DAILY 08/25/21   Philemon Kingdom, MD  Insulin Pen Needle 32G X 4 MM MISC Use 1x a day 07/03/19   Philemon Kingdom, MD  metFORMIN (GLUCOPHAGE-XR) 500 MG 24 hr tablet TAKE 4 TABLETS (2,000 MG TOTAL) BY MOUTH DAILY WITH SUPPER. 09/20/22   Philemon Kingdom, MD  omeprazole (PRILOSEC) 40 MG capsule Take 1 capsule (40 mg total) by mouth daily. Before dinner 12/05/22   Steele Sizer, MD  OneTouch Delica Lancets 99991111 MISC Use 2x a day with OneTouch Verio Flex 07/03/19   Philemon Kingdom, MD  [facility] VERIO test strip USE 2X A DAY WITH ONETOUCH VERIO FLEX 10/02/21   Philemon Kingdom, MD  rosuvastatin (CRESTOR) 20 MG tablet Take 1 tablet (20 mg total) by mouth daily. 10/31/20   Philemon Kingdom, MD  tirzepatide [facility]) 12.5 MG/0.5ML Pen Inject 12.5 mg into the skin once a week. 02/05/22   Philemon Kingdom, MD  tirzepatide [facility]) 15 MG/0.5ML Pen Inject 15 mg into the skin once a week. 09/04/22   Philemon Kingdom, MD  tranexamic acid (LYSTEDA)  650 MG TABS tablet Take 2 tablets (1,300 mg total) by mouth 3 (three) times daily. Take during menses for a maximum of five days 12/29/21   [name], MD  zonisamide (ZONEGRAN) 100 MG capsule Take 400 mg by mouth at bedtime.     Roque Cash, MD    Family History Family History  Problem Relation Age of Onset   Diabetes Mother    Healthy Father  Cancer Maternal Grandmother    Breast cancer Paternal Grandmother 76   Cancer Paternal Grandmother    Cancer Paternal Aunt    Heart disease Neg Hx     Social History Social History   Tobacco Use   Smoking status: Never   Smokeless tobacco: Never  Vaping Use   Vaping Use: Never used  Substance Use Topics   Alcohol use: Yes    Comment: occ   Drug use: No     Allergies   Peanuts [peanut oil] and Aspirin   Review of Systems Review of Systems  Constitutional:  Positive for fever.  Respiratory:  Positive for cough.   As per HPI   Physical Exam Triage Vital Signs ED Triage Vitals  Enc Vitals Group     BP 12/16/22 1230 122/83     Pulse Rate 12/16/22 1230 (!) 110     Resp 12/16/22 1230 20     Temp 12/16/22 1230 98.6 F (37 C)     Temp Source 12/16/22 1230 Oral     SpO2 12/16/22 1230 98 %     Weight 12/16/22 1232 270 lb (122.5 kg)     Height 12/16/22 1232 5' 7"$  (1.702 m)     Head Circumference --      Peak Flow --      Pain Score 12/16/22 1231 7     Pain Loc --      Pain Edu? --      Excl. in [place]? --    No data found.  Updated Vital Signs BP 122/83 (BP Location: Left Arm)   Pulse (!) 110   Temp 98.6 F (37 C) (Oral)   Resp 20   Ht 5' 7"$  (1.702 m)   Wt 270 lb (122.5 kg)   LMP 12/13/2022 (Exact Date)   SpO2 98%   BMI 42.29 kg/m   Visual Acuity Right Eye Distance:   Left Eye Distance:   Bilateral Distance:    Right Eye Near:   Left Eye Near:    Bilateral Near:     Physical Exam Vitals and nursing note reviewed.  Constitutional:      General: She is not in acute distress.    Appearance: She is  well-developed. She is obese. She is ill-appearing. She is not toxic-appearing or diaphoretic.  HENT:     Head: Normocephalic and atraumatic.     Right Ear: Tympanic membrane, ear canal and external ear normal. There is no impacted cerumen.     Left Ear: Tympanic membrane, ear canal and external ear normal. There is no impacted cerumen.     Nose: Nose normal. No congestion or rhinorrhea.     Mouth/Throat:     Mouth: Mucous membranes are moist.     Pharynx: No oropharyngeal exudate or posterior oropharyngeal erythema.  Eyes:     General: No scleral icterus.       Right eye: No discharge.        Left eye: No discharge.     Extraocular Movements: Extraocular movements intact.     Conjunctiva/sclera: Conjunctivae normal.     Pupils: Pupils are equal, round, and reactive to light.  Cardiovascular:     Rate and Rhythm: Regular rhythm. Tachycardia present.     Pulses: Normal pulses.     Heart sounds: Normal heart sounds. No murmur heard.    No gallop.  Pulmonary:     Effort: Pulmonary effort is normal. No respiratory distress.     Breath sounds: Normal breath sounds.  No stridor. No wheezing, rhonchi or rales.  Chest:     Chest wall: No tenderness.  Abdominal:     Palpations: Abdomen is soft.     Tenderness: There is no abdominal tenderness.  Musculoskeletal:        General: No swelling. Normal range of motion.     Cervical back: Normal range of motion and neck supple. Tenderness (very tense, knotted scalenes and traps on L) present. No rigidity.     Right lower leg: No edema.     Left lower leg: No edema.  Lymphadenopathy:     Cervical: No cervical adenopathy.  Skin:    General: Skin is warm and dry.     Capillary Refill: Capillary refill takes less than 2 seconds.     Findings: No bruising, erythema or rash.  Neurological:     General: No focal deficit present.     Mental Status: She is alert and oriented to person, place, and time.  Psychiatric:        Mood and Affect: Mood  normal.      UC Treatments / Results  Labs (all labs ordered are listed, but only abnormal results are displayed) Labs Reviewed  POCT INFLUENZA A/B - Abnormal; Notable for the following components:      Result Value   Influenza B, POC Positive (*)    All other components within normal limits  POC SARS CORONAVIRUS 2 AG -  ED    EKG   Radiology No results found.  Procedures Procedures (including critical care time)  Medications Ordered in UC Medications  ibuprofen (ADVIL) tablet 600 mg (600 mg Oral Given 12/16/22 1251)    Initial Impression / Assessment and Plan / UC Course  I have reviewed the triage vital signs and the nursing notes.  Pertinent labs & imaging results that were available during my care of the patient were reviewed by me and considered in my medical decision making (see chart for details).     Influenza B - pt still within tx window therefore will start xofluza x 1 dose now. Additional supportive measures reviewed. Neck pain - pt's sx appear to be muscular in nature. No s/sx of nuchal rigidity. TTP therefore recommended moist heat, massage, muscle relaxer. Ibuprofen.   Final Clinical Impressions(s) / UC Diagnoses   Final diagnoses:  Influenza B  Neck pain, musculoskeletal     Discharge Instructions      You are positive for influenza. Please start taking the treatment as soon as possible, a single tablet of Xofluza. This will help prevent worsening symptoms, and shorten the course of the flu. Please take OTC oscillococcinum which helps with body aches and fatigue. Please alternate Tylenol and ibuprofen as needed for aches and fever. Rest.  Drink plenty of water. You are considered contagious until 24 hours after your fever breaks.   Additionally, you are having tension to your scalene muscles in your neck. Please purchase a microwavable heat pack and apply to your neck several times daily to help release the tension. I have called in a  muscle relaxer that you can take up to 3 times daily.  Take with caution as this may make you feel slightly drowsy.  Do not operate heavy machinery or drive a car after taking.     ED Prescriptions     Medication Sig Dispense Auth. Provider   baclofen (LIORESAL) 10 MG tablet Take 1 tablet (10 mg total) by mouth 3 (three) times daily. 17 each Geryl Councilman  L, PA   Baloxavir Marboxil,80 MG Dose, (XOFLUZA, 80 MG DOSE,) 1 x 80 MG TBPK Take 1 tablet by mouth once for 1 dose. 1 each Chaney Malling, PA      PDMP not reviewed this encounter.   Chaney Malling, [place] 12/16/22 1515

## 2022-12-24 ENCOUNTER — Other Ambulatory Visit: Payer: Self-pay

## 2022-12-24 DIAGNOSIS — Z794 Long term (current) use of insulin: Secondary | ICD-10-CM

## 2022-12-24 MED ORDER — LANTUS SOLOSTAR 100 UNIT/ML ~~LOC~~ SOPN: PEN_INJECTOR | SUBCUTANEOUS | 0 refills | Status: DC

## 2023-01-03 ENCOUNTER — Encounter: Payer: Self-pay | Admitting: Internal Medicine

## 2023-01-03 ENCOUNTER — Ambulatory Visit (INDEPENDENT_AMBULATORY_CARE_PROVIDER_SITE_OTHER): Payer: 59 | Admitting: Internal Medicine

## 2023-01-03 VITALS — BP 122/82 | HR 99 | Ht 67.0 in | Wt 272.4 lb

## 2023-01-03 DIAGNOSIS — Z794 Long term (current) use of insulin: Secondary | ICD-10-CM | POA: Diagnosis not present

## 2023-01-03 DIAGNOSIS — E785 Hyperlipidemia, unspecified: Secondary | ICD-10-CM | POA: Diagnosis not present

## 2023-01-03 DIAGNOSIS — E042 Nontoxic multinodular goiter: Secondary | ICD-10-CM | POA: Diagnosis not present

## 2023-01-03 DIAGNOSIS — R809 Proteinuria, unspecified: Secondary | ICD-10-CM | POA: Diagnosis not present

## 2023-01-03 DIAGNOSIS — E1129 Type 2 diabetes mellitus with other diabetic kidney complication: Secondary | ICD-10-CM

## 2023-01-03 LAB — POCT GLYCOSYLATED HEMOGLOBIN (HGB A1C): Hemoglobin A1C: 5.8 % — AB (ref 4.0–5.6)

## 2023-01-03 NOTE — Progress Notes (Signed)
Patient ID: [name], female   DOB: Oct 16, 1976, 47 y.o.   MRN: [id number]   HPI: Erin Good is a 47 y.o.-year-old female, initially referred by her PCP, Dr. Ancil Good, presenting for follow-up for DM2, dx in ~2015, insulin-dependent since 2019, uncontrolled, with complications (diabetic retinopathy, microalbuminuria) and also for thyroid nodules.  Last visit 4 months ago.  Interim history: No increased urination, blurry vision, nausea, chest pain.  She lost approximately 50 pounds after starting [facility] She had the flu earlier this mo.  Reviewed HbA1c levels: Lab Results  Component Value Date   HGBA1C 6.2 (A) 09/04/2022   HGBA1C 6.5 (A) 04/04/2022   HGBA1C 7.8 (A) 12/01/2021   HGBA1C 9.7 (A) 09/01/2021   HGBA1C 7.6 (H) 10/03/2020   HGBA1C 7.9 (A) 08/15/2020   HGBA1C 9.0 (H) 06/30/2020   HGBA1C 8.3 (A) 05/30/2020   HGBA1C 8.0 (A) 11/12/2019   HGBA1C 10.9 (H) 05/25/2019   Pt was on a regimen of: - Metformin ER 750 mg 2x a day, with meals - Soliqua (Lantus + Lixisenatide) 50 units daily in am  Currently on: - Metformin ER 750 mg 2x a day with meals >> 1500 >> 2000 mg with dinner - Lantus 50 >> 56 units at bedtime - occas. Skipping 2/2 lows - Ozempic 1 mg weekly >> Mounjaro 7.5 >> 10 >> 12.5 >> 15 mg weekly Tried Farxiga 06/2020 >> not covered.  She checks her sugars 1-2 times a day-no log, no meter: - am: 120-155 >> 130-206 >> 110, 120-130s, 160 >> 110-130 >> 87-110 - 2h after b'fast: n/c >> 85-130 >> n/c >> 166, 207 >> n/c >> 150-158 - before lunch:  92-180s >> n/c >> 120-140 >> 110-140, 150 >> n/c - 2h after lunch:  140-180 >> 160s-210 >> 125-153 >> n/c >> 150s - before dinner: 178-198 >> 70s, 120-140 >> 110-140, 150 >> 60 (had the flu) - 2h after dinner: n/c  - bedtime: 150s >> n/c >> 170-330 >> N/c >> 76 if skips the meal >> n/c - nighttime: n/c Lowest sugar was 80 >> 120 >> 82 >> 70s >> 76 >> 60; she has hypoglycemia awareness at 100. Highest sugar  was 330 >> 206 >> 160 >> 150 >> 158.  Glucometer: [employer] advance  Pt's meals are: - Breakfast: 2 eggs + 3 pieces of bacon >> 2 eggs - snack: fruit - Lunch: salad - Dinner:meat + veggie + starch (rice + pasta) - Snacks: 1  Reducing sodas: Coke >> sparkling water. I did advise her to stop juice in the past >> off now.   -No CKD, last BUN/creatinine:  Lab Results  Component Value Date   BUN 12 06/23/2022   BUN 12 12/01/2021   CREATININE 0.78 06/23/2022   CREATININE 0.85 12/01/2021  Not on ACE inhibitor/ARB.  ACR levels are high: Lab Results  Component Value Date   MICRALBCREAT 1.6 12/01/2021   MICRALBCREAT 3.9 10/27/2020   MICRALBCREAT 33 (H) 11/12/2019   MICRALBCREAT 30 (H) 01/29/2019   -+ HL; last set of lipids: Lab Results  Component Value Date   CHOL 210 (H) 12/01/2021   HDL 34.80 (L) 12/01/2021   LDLCALC 148 (H) 12/01/2021   TRIG 133.0 12/01/2021   CHOLHDL 6 12/01/2021  At last visit, she was on Crestor 10 mg daily and I advised her to increase the dose to 20 mg daily. She takes this now.  - last eye exam was in 2023: ? DR (need to get the report).  -  no numbness and tingling in her feet.  Occasional numbness in the right leg and foot.  Foot Exam 04/04/2022.  Pt has FH of DM in mother, MGF.  Thyroid nodules:  Reviewed previous work-up: 06/01/2019: Thyroid ultrasound: Parenchymal Echotexture: Mildly heterogenous Isthmus: 0.4 cm thickness Right lobe: 5 x 2.4 x 2.8 cm Left lobe: 4.5 x 2 x 2.1 cm    Estimated total number of nodules >/= 1 cm: 4   Nodule # 1: Location: Right; Mid Maximum size: 1.6 cm; Other 2 dimensions: 1.5 x 1.4 cm Composition: mixed cystic and solid (1) Echogenicity: hypoechoic (2) *Given size (>/= 1.5 - 2.4 cm) and appearance, a follow-up ultrasound in 1 year should be considered based on TI-RADS criteria. _________________________________________________________   Nodule # 2: Location: Right; Inferior Maximum size: 2.3 cm; Other 2  dimensions: 1.9 x 1.9 cm Composition: solid/almost completely solid (2) Echogenicity: hypoechoic (2)  **Given size (>/= 1.5 cm) and appearance, fine needle aspiration of this moderately suspicious nodule should be considered based on TI-RADS criteria. _________________________________________________   Nodule # 3: Location: Left; Inferior Maximum size: 1.4 cm; Other 2 dimensions: 1.2 x 1.2 cm Composition: solid/almost completely solid (2) Echogenicity: isoechoic (1)  Given size (<1.4 cm) and appearance, this nodule does NOT meet TI-RADS criteria for biopsy or dedicated follow-up. _________________________________________________________   Nodule # 4: Location: Left; Mid Maximum size: 1.8 cm; Other 2 dimensions: 1.6 x 1.3 cm Composition: solid/almost completely solid (2) Echogenicity: hypoechoic (2) **Given size (>/= 1.5 cm) and appearance, fine needle aspiration of this moderately suspicious nodule should be considered based on TI-RADS criteria.   IMPRESSION: 1. Thyromegaly with bilateral nodules. 2. Recommend FNA biopsy of moderately suspicious 1.8 cm mid left AND 2.3 cm inferior right nodules. 3. Recommend annual/biennial ultrasound follow-up of additional nodules as above, until stability x5 years confirmed.  08/05/2019: FNA of the 2 nodules: Clinical History: Right inferior 2.3cm; Other 2 dimensions: 1.9 x 1.9cm,  Solid / almost completely solid, Hypoechoic, TI-RADS total points 4  Specimen Submitted:  A. THYROID, RLP, FINE NEEDLE ASPIRATION:  DIAGNOSIS:  - Consistent with benign follicular nodule (Erin Good category II)  SPECIMEN ADEQUACY:  Satisfactory for evaluation   Clinical History: Left mid 1.8cm; Other 2 dimensions: 1.6 x 1.3cm, Solid  / almost completely solid, Hypoechoic, TI-RADS total points 4  Specimen Submitted:  A. THYROID, LMP, FINE NEEDLE  ASPIRATION:  DIAGNOSIS:  - Consistent with benign follicular nodule (Erin Good category II)  SPECIMEN ADEQUACY:   Satisfactory for evaluation  Thyroid U/S (09/18/2021): Parenchymal Echotexture: Mildly heterogenous Isthmus: 0.5 cm, previously 0.4 cm Right lobe: 6.1 x 2.2 x 2.8 cm, previously 5.0 x 2.4 x 2.8 cm Left lobe: 5.3 x 2.3 x 2.4 cm, previously 4.5 x 2.0 x 2.1 cm _________________________________________________________   Estimated total number of nodules >/= 1 cm: 5 _________________________________________________________   Nodule # 1: Location: Right; Superior Maximum size: 2.0 cm; Other 2 dimensions: 1.7 x 1.7 cm, previously 1.6 x 1.5 x 1.4 cm Composition: mixed cystic and solid (1) Echogenicity: isoechoic (1) This nodule does NOT meet TI-RADS criteria for biopsy or dedicated follow-up. _________________________________________________________   Nodule # 2: Location: Right; Mid Maximum size: 2.9 cm; Other 2 dimensions: 2.2 x 1.7 cm, previously 2.3 x 1.9 x 1.9 cm This nodule is mixed cystic and solid, and was previously biopsied.  _________________________________________________________   Nodule # 3: Location: Right; Inferior Maximum size: 1.5 cm; Other 2 dimensions: 1.4 x 0.9 cm Composition: solid/almost completely solid (2) Echogenicity: isoechoic (1) *Given size (>/=  1.5 - 2.4 cm) and appearance, a follow-up ultrasound in 1 year should be considered based on TI-RADS criteria. _________________________________________________________   Nodule # 4: Location: Left; Mid Maximum size: 2.5 cm; Other 2 dimensions: 2.0 x 1.5 cm, previously 1.8 x 1.6 x 1.2 cm This nodule is mixed cystic and solid, and was previously biopsied. _________________________________________________________   Nodule # 5: Location: Left; Inferior Maximum size: 1.5 cm; Other 2 dimensions: 1.2 x 1.0 cm, previously 1.4 x 1.2 x 1.2 cm Composition: solid/almost completely solid (2) Echogenicity: isoechoic (1) *Given size (>/= 1.5 - 2.4 cm) and appearance, a follow-up ultrasound in 1 year should be  considered based on TI-RADS criteria.  _________________________________________________________   No cervical lymphadenopathy, within the imaged neck   IMPRESSION: 1. Multinodular thyroid gland, with similar appearance of previously-biopsied nodules as above. 2. 1.5 cm bilateral TR 3 nodules. Attention on follow-up.  *Given size (>/= 1.5 - 2.4 cm) and appearance, a follow-up ultrasound in 1 year should be considered based on TI-RADS criteria.  Thyroid U/S (09/06/2022): Parenchymal Echotexture: Mildly heterogenous  Isthmus: 0.4 cm  Right lobe: 6.0 x 2.3 x 3.1 cm  Left lobe: 5.1 x 2.3 x 2.4 cm  _________________________________________________________   Estimated total number of nodules >/= 1 cm: 5 _________________________________________________________   Nodule # 1:  Location: RIGHT; Superior  Maximum size: 2.1 cm; Other 2 dimensions: 1.9 x 2.5 cm, previously 2.0 x 1.7 x 1.7 cm  Composition: mixed cystic and solid (1) Echogenicity: isoechoic (1)  This nodule does NOT meet TI-RADS criteria for biopsy or dedicated follow-up.  _________________________________________________________   Nodule # 2:  Location: RIGHT; Mid/inferior  Maximum size: 3.1 cm; Other 2 dimensions: 2.5 x 1.9 cm  No aggressive features on today's evaluation. This nodule appears morphologically stable for and was previously biopsied in 08/05/2019. Assuming a benign pathologic diagnosis, repeat sampling and/or dedicated follow-up is not recommended.  _________________________________________________________   Nodule # 3:  Location: RIGHT; Inferior  Maximum size: 1.5 cm; Other 2 dimensions: 1.4 x 1.3 cm, previously 1.5 x 1.4 x 0.9 cm  Composition: solid/almost completely solid (2)  Echogenicity: isoechoic (1) *Given size (>/= 1.5 - 2.4 cm) and appearance, a follow-up ultrasound in 1 year should be considered based on TI-RADS criteria.  _________________________________________________________   Nodule  # 4:  Location: LEFT; Mid  Maximum size: 2.6 cm; Other 2 dimensions: 2.2 x 2.2 cm, previously 2.5 x 2.0 x 1.5 cm  No aggressive features on today's evaluation. This nodule appears morphologically stable and was previously biopsied in 08/05/2019. Assuming a benign pathologic diagnosis, repeat sampling and/or dedicated follow-up is not recommended.  _________________________________________________________   Nodule # 5:  Location: LEFT; Inferior  Maximum size: 1.7 cm; Other 2 dimensions: 1.6 x 1.1 cm, previously 1.5 x 1.2 x 1.0 cm  Composition: solid/almost completely solid (2)  Echogenicity: isoechoic (1) *Given size (>/= 1.5 - 2.4 cm) and appearance, a follow-up ultrasound in 1 year should be considered based on TI-RADS criteria.  _________________________________________________________   No cervical adenopathy or abnormal fluid collection within the imaged neck.   IMPRESSION: 1. Multinodular thyroid gland. 2. 1.5 cm bilateral inferior TR-3 thyroid nodules. A follow-up ultrasound in 1 year should be considered based on TI-RADS criteria. 3. Additional smaller and previously-biopsied nodules, as described above, without follow-up nor biopsy indicated per current criteria.  Reviewed her TFTs: Lab Results  Component Value Date   TSH 1.05 12/01/2021   TSH 1.74 10/27/2020   TSH 1.35 05/25/2019   TSH 1.070 07/28/2015  TSH 1.13 10/15/2014   Pt denies: - feeling nodules in neck - hoarseness - dysphagia - choking  She also has a history of PCOS and distant history of epilepsy at 47 years old.  ROS: Signs see HPI  I reviewed pt's medications, allergies, PMH, social hx, family hx, and changes were documented in the history of present illness. Otherwise, unchanged from my initial visit note.  Past Medical History:  Diagnosis Date   Allergic rhinitis    Breast discharge 06/05/2017   2 weeks ago left   Breast mass 12/06/2016   left   COVID-19 virus infection 11/2020    Diabetes mellitus without complication ([place])    Dyslipidemia    Epilepsy ([place])    Febrile seizures ([place])    Galactorrhea    Hx gestational diabetes    Hypertension    Kidney stones    Morbid obesity ([place])    Obesity    Seizures ([place])    Sleep apnea    Syncope and collapse    Tachycardia    Past Surgical History:  Procedure Laterality Date   BREAST BIOPSY Left 2018   benign   CESAREAN SECTION     X 2   COLONOSCOPY WITH PROPOFOL N/A 09/26/2022   Procedure: COLONOSCOPY WITH PROPOFOL;  Surgeon: Lin Landsman, MD;  Location: [facility];  Service: Gastroenterology;  Laterality: N/A;   IRRIGATION AND DEBRIDEMENT SHOULDER Left 10/12/2020   Procedure: IRRIGATION AND DEBRIDEMENT SHOULDER;  Surgeon: Hiram Gash, MD;  Location: [facility];  Service: Orthopedics;  Laterality: Left;   RIGHT OOPHORECTOMY Right 2001   benign tumor   TUBAL LIGATION  2007   Social History   Socioeconomic History   Marital status: Married    Spouse name: Roderic Palau   Number of children: 2   Years of education: College   Highest education level: Not on file  Occupational History   Occupation: Optometrist  Tobacco Use   Smoking status: Never   Smokeless tobacco: Never  Vaping Use   Vaping Use: Never used  Substance and Sexual Activity   Alcohol use: Yes    Comment: occ   Drug use: No   Sexual activity: Yes    Partners: Male    Birth control/protection: Surgical    Comment: tubial lig  Other Topics Concern   Not on file  Social History Narrative   Not on file   Social Determinants of Health   Financial Resource Strain: Low Risk  (11/12/2019)   Overall Financial Resource Strain (CARDIA)    Difficulty of Paying Living Expenses: Not hard at all  Food Insecurity: No Food Insecurity (11/12/2019)   Hunger Vital Sign    Worried About Running Out of Food in the Last Year: Never true    [place] in the Last Year: Never true  Transportation Needs: No Transportation Needs (11/12/2019)   PRAPARE  - Hydrologist (Medical): No    Lack of Transportation (Non-Medical): No  Physical Activity: Inactive (01/29/2019)   Exercise Vital Sign    Days of Exercise per Week: 0 days    Minutes of Exercise per Session: 0 min  Stress: No Stress Concern Present (11/12/2019)   [place]    Feeling of Stress : Not at all  Social Connections: Moderately Isolated (11/12/2019)   Social Connection and Isolation Panel [NHANES]    Frequency of Communication with Friends and Family: More than three times a  week    Frequency of Social Gatherings with Friends and Family: More than three times a week    Attends Religious Services: Never    Marine scientist or Organizations: No    Attends Archivist Meetings: Never    Marital Status: Married  Human resources officer Violence: Not At Risk (11/12/2019)   Humiliation, Afraid, Rape, and Kick questionnaire    Fear of Current or Ex-Partner: No    Emotionally Abused: No    Physically Abused: No    Sexually Abused: No   Meds: Current Outpatient Medications on File Prior to Visit  Medication Sig Dispense Refill   acetaminophen (TYLENOL) 325 MG tablet Take 650 mg by mouth every 6 (six) hours as needed.     baclofen (LIORESAL) 10 MG tablet Take 1 tablet (10 mg total) by mouth 3 (three) times daily. 30 each 0   Cholecalciferol (VITAMIN D) 50 MCG (2000 UT) CAPS Take 1 capsule by mouth daily at 12 noon.     clonazePAM (KLONOPIN) 0.5 MG tablet Take 0.5 mg by mouth daily as needed (Seizures).   3   dextromethorphan-guaiFENesin (MUCINEX DM) 30-600 MG 12hr tablet Take 1 tablet by mouth 2 (two) times daily.     EPINEPHrine 0.3 mg/0.3 mL IJ SOAJ injection Inject 0.3 mg into the muscle as needed for anaphylaxis (for anaphylaxis).      fluticasone (FLONASE) 50 MCG/ACT nasal spray Place 1 spray into both nostrils daily. 16 g 0   ibuprofen (ADVIL) 200 MG tablet Take 200 mg by mouth  every 6 (six) hours as needed.     insulin glargine (LANTUS SOLOSTAR) 100 UNIT/ML Solostar Pen INJECT 56 UNITS INTO THE SKIN DAILY 45 mL 0   Insulin Pen Needle 32G X 4 MM MISC Use 1x a day 100 each 3   metFORMIN (GLUCOPHAGE-XR) 500 MG 24 hr tablet TAKE 4 TABLETS (2,000 MG TOTAL) BY MOUTH DAILY WITH SUPPER. 120 tablet 5   omeprazole (PRILOSEC) 40 MG capsule Take 1 capsule (40 mg total) by mouth daily. Before dinner 30 capsule 0   OneTouch Delica Lancets 99991111 MISC Use 2x a day with OneTouch Verio Flex 200 each 3   ONETOUCH VERIO test strip USE 2X A DAY WITH ONETOUCH VERIO FLEX 50 strip 15   rosuvastatin (CRESTOR) 20 MG tablet Take 1 tablet (20 mg total) by mouth daily. 90 tablet 3   tirzepatide (MOUNJARO) 12.5 MG/0.5ML Pen Inject 12.5 mg into the skin once a week. 6 mL 1   tirzepatide (MOUNJARO) 15 MG/0.5ML Pen Inject 15 mg into the skin once a week. 6 mL 3   tranexamic acid (LYSTEDA) 650 MG TABS tablet Take 2 tablets (1,300 mg total) by mouth 3 (three) times daily. Take during menses for a maximum of five days 30 tablet 2   zonisamide (ZONEGRAN) 100 MG capsule Take 400 mg by mouth at bedtime.      No current facility-administered medications on file prior to visit.   Allergies  Allergen Reactions   Peanuts [Peanut Oil] Anaphylaxis   Aspirin Other (See Comments)    Does not take because of her epilepsy/seizure    Family History  Problem Relation Age of Onset   Diabetes Mother    Healthy Father    Cancer Maternal Grandmother    Breast cancer Paternal Grandmother 45   Cancer Paternal Grandmother    Cancer Paternal Aunt    Heart disease Neg Hx    PE: BP 122/82 (BP Location: Left Arm, Patient Position:  Sitting, Cuff Size: Normal)   Pulse 99   Ht 5' 7"$  (1.702 m)   Wt 272 lb 6.4 oz (123.6 kg)   LMP 12/13/2022 (Exact Date)   SpO2 99%   BMI 42.66 kg/m  Wt Readings from Last 3 Encounters:  01/03/23 272 lb 6.4 oz (123.6 kg)  12/16/22 270 lb (122.5 kg)  09/26/22 271 lb (122.9 kg)    Constitutional: overweight, in NAD Eyes: EOMI, no exophthalmos ENT: + B thyromegaly, no cervical lymphadenopathy Cardiovascular: tachycardia, RR, No MRG Respiratory: CTA B Musculoskeletal: no deformities Skin: no rashes Neurological: no tremor with outstretched hands  ASSESSMENT: 1. DM2, insulin-dependent, uncontrolled, with complications - DR - MAU  2.  Thyroid nodules  3. HL  4. Obesity class 3  PLAN:  1. Patient with longstanding, uncontrolled, on metformin, long-acting insulin, and GLP-1/GIP receptor control after starting Mounjaro.  At last visit, sugars were mostly at goal, with only occasional slight hyperglycemic values.  At that time she was on a slightly lower dose of Mounjaro, 12.5 mg weekly for 2 months due to lack of availability of the higher dose.  HbA1c was 6.2%, improved.  We did not change her regimen, but she was able to switch to the 15 mg dose since then. - at today's visit, sugars are at goal throughout the day but sometimes she needs to skip the insulin due to lower blood sugars overnight.  She had 1 low blood sugar in the 60s when having the flu.  At today's visit, since sugars are at goal when they are lower in the morning, will try to reduce the Lantus dose.  I advised her to reduce the dose further, rather than skipping it, if the sugars remain on the lower side.     - I suggested to:  Patient Instructions  Please continue: - Metformin ER 2000 mg with dinner - Mounjaro 15 mg weekly  Please reduce: - Lantus 40 units at bedtime  Please return in 4-6 months with your sugar log.   - we checked her HbA1c: 5.8% (lower) - advised to check sugars at different times of the day - 1x a day, rotating check times - advised for yearly eye exams >> she is UTD - return to clinic in 4-6 months  2.  Thyroid nodules -No neck compression symptoms -Latest TSH is normal: Lab Results  Component Value Date   TSH 1.05 12/01/2021  2 nodules were biopsied in 07/2019  with benign results -The thyroid ultrasound reports from 09/2021 and 09/24/2022 showed stable nodules. The dominant 2 nodules need follow-up in a year  3. HL -Reviewed lipid panel from 11/2021: LDL elevated, HDL low Lab Results  Component Value Date   CHOL 210 (H) 12/01/2021   HDL 34.80 (L) 12/01/2021   LDLCALC 148 (H) 12/01/2021   TRIG 133.0 12/01/2021   CHOLHDL 6 12/01/2021  -she continues on Crestor 20 mg daily without side effects -She is due for another lipid panel - she plans to schedule another appointment with PCP-she is due  4.  Obesity class III -Previously contemplating gastric sleeve surgery but after she lost almost 50 pounds on Mounjaro, plans are on hold -Since last visit, she lost 7 more pounds -We will continue Mounjaro for now   Philemon Kingdom, MD PhD [facility] Endocrinology

## 2023-01-03 NOTE — Patient Instructions (Addendum)
Please continue: - Metformin ER 2000 mg with dinner - Mounjaro 15 mg weekly  Please reduce: - Lantus 40 units at bedtime  Please return in 4-6 months with your sugar log.

## 2023-01-21 ENCOUNTER — Other Ambulatory Visit: Payer: Self-pay

## 2023-01-21 ENCOUNTER — Encounter (HOSPITAL_BASED_OUTPATIENT_CLINIC_OR_DEPARTMENT_OTHER): Payer: Self-pay

## 2023-01-21 ENCOUNTER — Emergency Department (HOSPITAL_BASED_OUTPATIENT_CLINIC_OR_DEPARTMENT_OTHER)
Admission: EM | Admit: 2023-01-21 | Discharge: 2023-01-21 | Disposition: A | Discharge status: Home / Self Care | Payer: 59 | Attending: Emergency Medicine | Admitting: Emergency Medicine

## 2023-01-21 DIAGNOSIS — Z91010 Allergy to peanuts: Secondary | ICD-10-CM | POA: Diagnosis not present

## 2023-01-21 DIAGNOSIS — Z7984 Long term (current) use of oral hypoglycemic drugs: Secondary | ICD-10-CM | POA: Diagnosis not present

## 2023-01-21 DIAGNOSIS — Z794 Long term (current) use of insulin: Secondary | ICD-10-CM | POA: Insufficient documentation

## 2023-01-21 DIAGNOSIS — L0231 Cutaneous abscess of buttock: Secondary | ICD-10-CM | POA: Diagnosis present

## 2023-01-21 LAB — CBG MONITORING, ED: Glucose-Capillary: 139 mg/dL — ABNORMAL HIGH (ref 70–99)

## 2023-01-21 MED ORDER — FLUCONAZOLE 150 MG PO TABS: 150.0000 mg | ORAL_TABLET | Freq: Once | ORAL | 0 refills | Status: AC

## 2023-01-21 MED ORDER — LIDOCAINE-EPINEPHRINE (PF) 2 %-1:200000 IJ SOLN
20.0000 mL | Freq: Once | INTRAMUSCULAR | Status: AC
  Administered 2023-01-21: 20 mL
  Filled 2023-01-21: qty 20

## 2023-01-21 MED ORDER — DOXYCYCLINE HYCLATE 100 MG PO CAPS: 100.0000 mg | ORAL_CAPSULE | Freq: Two times a day (BID) | ORAL | 0 refills | Status: DC

## 2023-01-21 NOTE — ED Provider Notes (Signed)
[facility] Provider Note   CSN: [id number] Arrival date & time: 01/21/23  1234     History  Chief Complaint  Patient presents with   Cyst    Erin Good is a 47 y.o. female who presents emergency department with chief complaint of gluteal cleft abscess.  Patient states that several days ago she noticed a bump on her right gluteal fold.  She states that it felt like a painful golf ball.  She had progressive worsening in the pain over the past several days.  She noticed some drainage from it today.  She states that she had shaking chills today and 1 episode of vomiting.  She states it is still quite painful.  She has never had anything like this before.  She states her last hemoglobin A1c was 5.8.  She denies fevers.  HPI     Home Medications Prior to Admission medications   Medication Sig Start Date End Date Taking? Authorizing Provider  acetaminophen (TYLENOL) 325 MG tablet Take 650 mg by mouth every 6 (six) hours as needed.    [provider]  baclofen (LIORESAL) 10 MG tablet Take 1 tablet (10 mg total) by mouth 3 (three) times daily. 12/16/22   Crain, Loree Fee L, PA  Cholecalciferol (VITAMIN D) 50 MCG (2000 UT) CAPS Take 1 capsule by mouth daily at 12 noon.    [provider]  clonazePAM (KLONOPIN) 0.5 MG tablet Take 0.5 mg by mouth daily as needed (Seizures).  08/14/16   [provider]  dextromethorphan-guaiFENesin (MUCINEX DM) 30-600 MG 12hr tablet Take 1 tablet by mouth 2 (two) times daily.    [provider]  EPINEPHrine 0.3 mg/0.3 mL IJ SOAJ injection Inject 0.3 mg into the muscle as needed for anaphylaxis (for anaphylaxis).  06/14/14   [provider]  fluticasone (FLONASE) 50 MCG/ACT nasal spray Place 1 spray into both nostrils daily. 11/19/20   Hall-Potvin, [place], PA-C  ibuprofen (ADVIL) 200 MG tablet Take 200 mg by mouth every 6 (six) hours as needed.    [provider]  insulin glargine (LANTUS SOLOSTAR) 100 UNIT/ML Solostar Pen INJECT 56 UNITS INTO THE SKIN DAILY 12/24/22   Philemon Kingdom, MD  Insulin Pen Needle 32G X 4 MM MISC Use 1x a day 07/03/19   Philemon Kingdom, MD  metFORMIN (GLUCOPHAGE-XR) 500 MG 24 hr tablet TAKE 4 TABLETS (2,000 MG TOTAL) BY MOUTH DAILY WITH SUPPER. 09/20/22   Philemon Kingdom, MD  omeprazole (PRILOSEC) 40 MG capsule Take 1 capsule (40 mg total) by mouth daily. Before dinner 12/05/22   Steele Sizer, MD  OneTouch Delica Lancets 33L MISC Use 2x a day with OneTouch Verio Flex 07/03/19   Philemon Kingdom, MD  [facility] VERIO test strip USE 2X A DAY WITH ONETOUCH VERIO FLEX 10/02/21   Philemon Kingdom, MD  rosuvastatin (CRESTOR) 20 MG tablet Take 1 tablet (20 mg total) by mouth daily. 10/31/20   Philemon Kingdom, MD  tirzepatide [facility]) 15 MG/0.5ML Pen Inject 15 mg into the skin once a week.    [provider]  tranexamic acid (LYSTEDA) 650 MG TABS tablet Take 2 tablets (1,300 mg total) by mouth 3 (three) times daily. Take during menses for a maximum of five days 12/29/21   [name], MD      Allergies    Peanuts [peanut oil] and Aspirin    Review of Systems   Review of Systems  Physical Exam Updated Vital Signs BP (!) 147/88 (BP  Location: Right Arm)   Pulse (!) 103   Temp 97.9 F (36.6 C)   Resp 16   Ht 5\' 7"  (1.702 m)   Wt 121.6 kg   SpO2 100%   BMI 41.97 kg/m  Physical Exam Vitals and nursing note reviewed.  Constitutional:      General: She is not in acute distress.    Appearance: She is well-developed. She is not diaphoretic.  HENT:     Head: Normocephalic and atraumatic.     Right Ear: External ear normal.     Left Ear: External ear normal.     Nose: Nose normal.     Mouth/Throat:     Mouth: Mucous membranes are moist.  Eyes:     General: No scleral icterus.    Conjunctiva/sclera: Conjunctivae normal.  Cardiovascular:     Rate and Rhythm: Normal rate and regular rhythm.     Heart  sounds: Normal heart sounds. No murmur heard.    No friction rub. No gallop.  Pulmonary:     Effort: Pulmonary effort is normal. No respiratory distress.     Breath sounds: Normal breath sounds.  Abdominal:     General: Bowel sounds are normal. There is no distension.     Palpations: Abdomen is soft. There is no mass.     Tenderness: There is no abdominal tenderness. There is no guarding.  Musculoskeletal:     Cervical back: Normal range of motion.  Skin:    General: Skin is warm and dry.     Comments: Area of erythema and fluctuance along with induration noted along the right side of the gluteal cleft at the superior aspect.  There is about a 4 cm area of tissue erosion with ulceration and there is some purulent drainage noted when gentle pressure is applied.  Neurological:     Mental Status: She is alert and oriented to person, place, and time.  Psychiatric:        Behavior: Behavior normal.     ED Results / Procedures / Treatments   Labs (all labs ordered are listed, but only abnormal results are displayed) Labs Reviewed  CBG MONITORING, ED - Abnormal; Notable for the following components:      Result Value   Glucose-Capillary 139 (*)    All other components within normal limits    EKG None  Radiology No results found.  Procedures .Marland KitchenIncision and Drainage  Date/Time: 01/21/2023 4:12 PM  Performed by: Margarita Mail, PA-C Authorized by: Margarita Mail, PA-C   Consent:    Consent obtained:  Verbal   Consent given by:  Patient   Risks discussed:  Bleeding, incomplete drainage, pain and damage to other organs   Alternatives discussed:  No treatment Universal protocol:    Procedure explained and questions answered to patient or proxy's satisfaction: yes     Relevant documents present and verified: yes     Test results available : yes     Imaging studies available: yes     Required blood products, implants, devices, and special equipment available: yes      Site/side marked: yes     Immediately prior to procedure, a time out was called: yes     Patient identity confirmed:  Verbally with patient Location:    Type:  Abscess   Location:  Anogenital   Anogenital location:  Gluteal cleft Pre-procedure details:    Skin preparation:  Povidone-iodine Anesthesia:    Anesthesia method:  Local infiltration   Local anesthetic:  Lidocaine 2% WITH epi Procedure type:    Complexity:  Complex Procedure details:    Incision types:  Single straight   Incision depth:  Subcutaneous   Wound management:  Probed and deloculated, irrigated with saline and extensive cleaning   Drainage:  Purulent   Drainage amount:  Moderate   Packing materials:  1/4 in gauze   Amount 1/4":  10 cm Post-procedure details:    Procedure completion:  Tolerated well, no immediate complications     Medications Ordered in ED Medications - No data to display  ED Course/ Medical Decision Making/ A&P                             Medical Decision Making Risk Prescription drug management.   There is no area of retained pus after procedure. The presentation of Glorian Bolles Gant-Pegues is NOT consistent with necrotizing fascitis or osteomyolitis. There is no evidence of retained foreign body, neurovascular or tendon injury. The presentation of Eilzabeth Nowlen Gant-Pegues is NOT consistent with sepsis and/or bacteremia. Geoffery Spruce Gant-Pegues meets outpatient criteria for treatment with doxycycline and is sent home on empiric antibiotics covering the relevant bacteria.  Strict return and follow-up precautions have been given by me personally or by detailed written instructions verbalized by nursing staff using the teach back method to the patient/family/caregiver(s).  Data Reviewed/Counseling: I have reviewed the patient's vital signs, nursing notes, and other relevant tests/information. I had a detailed discussion regarding the historical points, exam findings, and any diagnostic results  supporting the discharge diagnosis. I also discussed the need for outpatient follow-up and the need to return to the ED if symptoms worsen or if there are any questions or concerns that arise at home.         Final Clinical Impression(s) / ED Diagnoses Final diagnoses:  Abscess of gluteal cleft    Rx / DC Orders ED Discharge Orders     None         Margarita Mail, PA-C 01/21/23 1615    Hayden Rasmussen, MD 01/22/23 1004

## 2023-01-21 NOTE — ED Triage Notes (Addendum)
Patient here POV from Home.  Endorses Cyst located to [place] since Thursday. Increased in Size. States it is likely 3-6 cm in Size. No known Drainage from Area.  Possible Fever yesterday.   NAD Noted during Triage. A&Ox4. GCS 15. Ambulatory.

## 2023-01-21 NOTE — Discharge Instructions (Signed)
INCISION AND DRAINAGE WOUND CARE Please have your packing removed in 2-3 days or sooner if you have concerns. You may do this at any available urgent care or at your primary care doctor's office.  Keep area clean and dry for 24 hours. Do not remove bandage, if applied.  After 24 hours, remove bandage and wash wound gently with mild soap and warm water. Reapply a new bandage after cleaning wound, if directed.  Continue daily cleansing with soap and water   Seek medical careif you experience any of the following signs of infection: Swelling, redness, pus drainage, streaking, fever >101.0 F  Seek care if you experience excessive bleeding that does not stop after 15-20 minutes of constant, firm pressure.   

## 2023-01-21 NOTE — ED Notes (Signed)
Reviewed AVS/discharge instruction with patient. Time allotted for and all questions answered. Patient is agreeable for d/c and escorted to ed exit by staff.  Refused repeat vs prior to d/c

## 2023-01-21 NOTE — ED Notes (Signed)
CBG reported to [place], [place]

## 2023-01-24 NOTE — Progress Notes (Signed)
Name: Erin Good   MRN: [id number]    DOB: 1976/02/25   Date:01/25/2023       Progress Note  Subjective  Chief Complaint  Abscess Re-Check  HPI  EC follow up: she went to [facility] on 01/21/2023 due to pain on gluteal fold, she was diagnosed with pilonidal cyst that was drained and was given doxy, she is still having nausea , decrease in appetite, feeling clammy and shaky, no fever , heart rate elevated on her arrival at 140 and still elevated with rest. 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   Gastro-esophageal reflux disease without esophagitis 99991111   Dysmetabolic syndrome 99991111   NASH (nonalcoholic steatohepatitis) 04/20/2015   Allergy to nuts 04/20/2015   Calculus of kidney 04/20/2015   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   Central sleep apnea 11/26/2008   Dyslipidemia 07/02/2008   Leukocytosis 07/29/2007    Past Surgical History:  Procedure Laterality Date   BREAST BIOPSY Left 2018   benign   CESAREAN SECTION     X 2   COLONOSCOPY WITH PROPOFOL N/A 09/26/2022   Procedure: COLONOSCOPY WITH PROPOFOL;  Surgeon: Lin Landsman, MD;  Location: [facility] ENDOSCOPY;  Service: Gastroenterology;  Laterality: N/A;   IRRIGATION AND DEBRIDEMENT SHOULDER Left 10/12/2020   Procedure: IRRIGATION AND DEBRIDEMENT SHOULDER;  Surgeon: Hiram Gash, MD;  Location: [facility] ORS;   Service: Orthopedics;  Laterality: Left;   RIGHT OOPHORECTOMY Right 2001   benign tumor   TUBAL LIGATION  2007    Family History  Problem Relation Age of Onset   Diabetes Mother    Healthy Father    Cancer Maternal Grandmother    Breast cancer Paternal Grandmother 104   Cancer Paternal Grandmother    Cancer Paternal Aunt    Heart disease Neg Hx     Social History   Tobacco Use   Smoking status: Never   Smokeless tobacco: Never  Substance Use Topics   Alcohol use: Not Currently    Comment: occ     Current Outpatient Medications:    acetaminophen (TYLENOL) 325 MG tablet, Take 650 mg by mouth every 6 (six) hours as needed., Disp: , Rfl:    baclofen (LIORESAL) 10 MG tablet, Take 1 tablet (10 mg total) by mouth 3 (three) times daily., Disp: 30 each, Rfl: 0   Cholecalciferol (VITAMIN D) 50 MCG (2000 UT) CAPS, Take 1 capsule by mouth daily at 12 noon., Disp: , Rfl:    clonazePAM (KLONOPIN) 0.5 MG tablet, Take 0.5 mg by mouth daily as needed (Seizures). , Disp: , Rfl: 3   dextromethorphan-guaiFENesin (MUCINEX DM) 30-600 MG 12hr tablet, Take 1 tablet by mouth 2 (two) times daily., Disp: , Rfl:    doxycycline (VIBRAMYCIN) 100 MG capsule, Take 1 capsule (100 mg total) by mouth 2 (two) times daily. One po bid x 7 days, Disp: 14 capsule, Rfl:  0   EPINEPHrine 0.3 mg/0.3 mL IJ SOAJ injection, Inject 0.3 mg into the muscle as needed for anaphylaxis (for anaphylaxis). , Disp: , Rfl:    fluticasone (FLONASE) 50 MCG/ACT nasal spray, Place 1 spray into both nostrils daily., Disp: 16 g, Rfl: 0   ibuprofen (ADVIL) 200 MG tablet, Take 200 mg by mouth every 6 (six) hours as needed., Disp: , Rfl:    insulin glargine (LANTUS SOLOSTAR) 100 UNIT/ML Solostar Pen, INJECT 56 UNITS INTO THE SKIN DAILY, Disp: 45 mL, Rfl: 0   Insulin Pen Needle 32G X 4 MM MISC, Use 1x a day, Disp: 100 each, Rfl: 3   metFORMIN (GLUCOPHAGE-XR) 500 MG 24 hr tablet, TAKE 4 TABLETS (2,000 MG TOTAL) BY MOUTH DAILY WITH SUPPER., Disp:  120 tablet, Rfl: 5   omeprazole (PRILOSEC) 40 MG capsule, Take 1 capsule (40 mg total) by mouth daily. Before dinner, Disp: 30 capsule, Rfl: 0   OneTouch Delica Lancets 99991111 MISC, Use 2x a day with OneTouch Verio Flex, Disp: 200 each, Rfl: 3   ONETOUCH VERIO test strip, USE 2X A DAY WITH ONETOUCH VERIO FLEX, Disp: 50 strip, Rfl: 15   rosuvastatin (CRESTOR) 20 MG tablet, Take 1 tablet (20 mg total) by mouth daily., Disp: 90 tablet, Rfl: 3   tirzepatide (MOUNJARO) 15 MG/0.5ML Pen, Inject 15 mg into the skin once a week., Disp: , Rfl:    tranexamic acid (LYSTEDA) 650 MG TABS tablet, Take 2 tablets (1,300 mg total) by mouth 3 (three) times daily. Take during menses for a maximum of five days, Disp: 30 tablet, Rfl: 2  Allergies  Allergen Reactions   Peanuts [Peanut Oil] Anaphylaxis   Aspirin Other (See Comments)    Does not take because of her epilepsy/seizure     I personally reviewed active problem list, medication list, allergies, family history, social history, health maintenance with the patient/caregiver today.   ROS  Ten systems reviewed and is negative except as mentioned in HPI   Objective  Vitals:   01/25/23 1337  BP: 126/82  Pulse: (!) 115  Resp: 18  Temp: 97.8 F (36.6 C)  SpO2: 99%  Weight: 262 lb 9.6 oz (119.1 kg)    Body mass index is 41.13 kg/m.  Physical Exam  Constitutional: Patient appears well-developed and well-nourished. Obese  No distress.  HEENT: head atraumatic, normocephalic, pupils equal and reactive to light, neck supple Cardiovascular: Normal rate, regular rhythm and normal heart sounds.  No murmur heard. No BLE edema. Pulmonary/Chest: Effort normal and breath sounds normal. No respiratory distress. Abdominal: Soft.  There is no tenderness. Skin: see attached picture of gluteal fold, needs to rule necrotizing fascitis - getting labs and sending her to [facility] afterwards  Psychiatric: Patient has a normal mood and affect. behavior is normal. Judgment and  thought content normal.   Recent Results (from the past 2160 hour(s))  POC SARS Coronavirus 2 Ag-ED - Nasal Swab     Status: None   Collection Time: 12/16/22 12:49 PM  Result Value Ref Range   SARS Coronavirus 2 Ag Negative Negative  POCT Influenza A/B     Status: Abnormal   Collection Time: 12/16/22 12:49 PM  Result Value Ref Range   Influenza A, POC Negative Negative   Influenza B, POC Positive (A) Negative  POCT glycosylated hemoglobin (Hb A1C)     Status: Abnormal   Collection Time: 01/03/23  8:26 AM  Result Value Ref Range   Hemoglobin A1C 5.8 (A) 4.0 - 5.6 %  HbA1c POC (<> result, manual entry)     HbA1c, POC (prediabetic range)     HbA1c, POC (controlled diabetic range)    CBG monitoring, ED     Status: Abnormal   Collection Time: 01/21/23 12:51 PM  Result Value Ref Range   Glucose-Capillary 139 (H) 70 - 99 mg/dL    Comment: Glucose reference range applies only to samples taken after fasting for at least 8 hours.    PHQ2/9:    01/25/2023    1:39 PM 06/26/2022    1:45 PM 12/22/2021    1:00 PM 11/29/2020   11:18 AM 11/21/2020    2:14 PM  Depression screen PHQ 2/9  Decreased Interest 0 0 0 0 0  Down, Depressed, Hopeless 0 0 0 0 0  PHQ - 2 Score 0 0 0 0 0  Altered sleeping 0 0 0 0 0  Tired, decreased energy 0 3 0 3 0  Change in appetite 0 0 0 3 0  Feeling bad or failure about yourself  0 0 0 0 0  Trouble concentrating 0 0 0 0 0  Moving slowly or fidgety/restless 0 0 0 0 0  Suicidal thoughts 0 0 0 0 0  PHQ-9 Score 0 3 0 6 0  Difficult doing work/chores Not difficult at all   Not difficult at all Not difficult at all    phq 9 is negative   Fall Risk:    01/25/2023    1:39 PM 06/26/2022    1:45 PM 12/22/2021    1:00 PM 11/29/2020   11:18 AM 11/21/2020    2:14 PM  Fall Risk   Falls in the past year? 0 1 1 0 0  Number falls in past yr: 0 0 0 0 0  Injury with Fall? 0 1 1 0 0  Risk for fall due to :  No Fall Risks No Fall Risks    Follow up  Falls prevention  discussed Falls prevention discussed        Functional Status Survey: Is the patient deaf or have difficulty hearing?: No Does the patient have difficulty seeing, even when wearing glasses/contacts?: No Does the patient have difficulty concentrating, remembering, or making decisions?: No Does the patient have difficulty walking or climbing stairs?: No Does the patient have difficulty dressing or bathing?: No Does the patient have difficulty doing errands alone such as visiting a doctor's office or shopping?: No    Assessment & Plan  1. Abscess, gluteal cleft  See attached photo, it likely needs debridement it may be anaerobic infection, strong odor, patient may be septic She will get labs drawn and go to [facility] for at least debridement if not admission depending on results of labs  - CBC with Differential/Platelet; Future - Basic metabolic panel; Future - Procalcitonin; Future  2. Nausea and vomiting, unspecified vomiting type  - CBC with Differential/Platelet; Future - Basic metabolic panel; Future - Procalcitonin; Future  3. Tachycardia  - CBC with Differential/Platelet; Future - Basic metabolic panel; Future - Procalcitonin; Future  4. Dyslipidemia associated with type 2 diabetes mellitus ([place])

## 2023-01-25 ENCOUNTER — Ambulatory Visit (INDEPENDENT_AMBULATORY_CARE_PROVIDER_SITE_OTHER): Payer: 59 | Admitting: Family Medicine

## 2023-01-25 ENCOUNTER — Other Ambulatory Visit
Admission: RE | Admit: 2023-01-25 | Discharge: 2023-01-25 | Disposition: A | Discharge status: Home / Self Care | Payer: 59 | Source: Ambulatory Visit | Attending: Family Medicine | Admitting: Family Medicine

## 2023-01-25 ENCOUNTER — Emergency Department: Payer: 59

## 2023-01-25 ENCOUNTER — Other Ambulatory Visit: Payer: Self-pay

## 2023-01-25 ENCOUNTER — Emergency Department
Admission: EM | Admit: 2023-01-25 | Discharge: 2023-01-25 | Disposition: A | Discharge status: Home / Self Care | Payer: 59 | Attending: Emergency Medicine | Admitting: Emergency Medicine

## 2023-01-25 VITALS — BP 126/82 | HR 115 | Temp 97.8°F | Resp 18 | Wt 262.6 lb

## 2023-01-25 DIAGNOSIS — E1165 Type 2 diabetes mellitus with hyperglycemia: Secondary | ICD-10-CM | POA: Diagnosis not present

## 2023-01-25 DIAGNOSIS — L0231 Cutaneous abscess of buttock: Secondary | ICD-10-CM

## 2023-01-25 DIAGNOSIS — E785 Hyperlipidemia, unspecified: Secondary | ICD-10-CM

## 2023-01-25 DIAGNOSIS — E1169 Type 2 diabetes mellitus with other specified complication: Secondary | ICD-10-CM

## 2023-01-25 DIAGNOSIS — R112 Nausea with vomiting, unspecified: Secondary | ICD-10-CM

## 2023-01-25 DIAGNOSIS — I10 Essential (primary) hypertension: Secondary | ICD-10-CM | POA: Insufficient documentation

## 2023-01-25 DIAGNOSIS — R000 Tachycardia, unspecified: Secondary | ICD-10-CM

## 2023-01-25 DIAGNOSIS — L03317 Cellulitis of buttock: Secondary | ICD-10-CM | POA: Insufficient documentation

## 2023-01-25 DIAGNOSIS — D72829 Elevated white blood cell count, unspecified: Secondary | ICD-10-CM | POA: Insufficient documentation

## 2023-01-25 LAB — BASIC METABOLIC PANEL
Anion gap: 11 (ref 5–15)
BUN: 11 mg/dL (ref 6–20)
CO2: 23 mmol/L (ref 22–32)
Calcium: 9.5 mg/dL (ref 8.9–10.3)
Chloride: 104 mmol/L (ref 98–111)
Creatinine, Ser: 0.91 mg/dL (ref 0.44–1.00)
GFR, Estimated: >60 mL/min (ref >60)
Glucose, Bld: 156 mg/dL — ABNORMAL HIGH (ref 70–99)
Potassium: 3.4 mmol/L — ABNORMAL LOW (ref 3.5–5.1)
Sodium: 138 mmol/L (ref 135–145)

## 2023-01-25 LAB — CBC WITH DIFFERENTIAL/PLATELET
Abs Immature Granulocytes: 0.04 10*3/uL (ref 0.00–0.07)
Basophils Absolute: 0 10*3/uL (ref 0.0–0.1)
Basophils Relative: 0 %
Eosinophils Absolute: 0 10*3/uL (ref 0.0–0.5)
Eosinophils Relative: 0 %
HCT: 42.7 % (ref 36.0–46.0)
Hemoglobin: 14.1 g/dL (ref 12.0–15.0)
Immature Granulocytes: 0 %
Lymphocytes Relative: 22 %
Lymphs Abs: 2.7 10*3/uL (ref 0.7–4.0)
MCH: 26.3 pg (ref 26.0–34.0)
MCHC: 33 g/dL (ref 30.0–36.0)
MCV: 79.5 fL — ABNORMAL LOW (ref 80.0–100.0)
Monocytes Absolute: 0.9 10*3/uL (ref 0.1–1.0)
Monocytes Relative: 8 %
Neutro Abs: 8.4 10*3/uL — ABNORMAL HIGH (ref 1.7–7.7)
Neutrophils Relative %: 70 %
Platelets: 421 10*3/uL — ABNORMAL HIGH (ref 150–400)
RBC: 5.37 MIL/uL — ABNORMAL HIGH (ref 3.87–5.11)
RDW: 13.2 % (ref 11.5–15.5)
WBC: 12 10*3/uL — ABNORMAL HIGH (ref 4.0–10.5)
nRBC: 0 % (ref 0.0–0.2)

## 2023-01-25 LAB — PROCALCITONIN: Procalcitonin: <0.1 ng/mL

## 2023-01-25 MED ORDER — ONDANSETRON HCL 4 MG/2ML IJ SOLN
4.0000 mg | Freq: Once | INTRAMUSCULAR | Status: AC
  Administered 2023-01-25: 4 mg via INTRAVENOUS
  Filled 2023-01-25: qty 2

## 2023-01-25 MED ORDER — IOHEXOL 300 MG/ML  SOLN
100.0000 mL | Freq: Once | INTRAMUSCULAR | Status: AC | PRN
  Administered 2023-01-25: 100 mL via INTRAVENOUS

## 2023-01-25 MED ORDER — HYDROCODONE-ACETAMINOPHEN 5-325 MG PO TABS: 1.0000 | ORAL_TABLET | Freq: Four times a day (QID) | ORAL | 0 refills | Status: AC | PRN

## 2023-01-25 MED ORDER — ONDANSETRON 4 MG PO TBDP: 4.0000 mg | ORAL_TABLET | Freq: Three times a day (TID) | ORAL | 0 refills | Status: DC | PRN

## 2023-01-25 MED ORDER — LACTATED RINGERS IV BOLUS
1000.0000 mL | Freq: Once | INTRAVENOUS | Status: AC
  Administered 2023-01-25: 1000 mL via INTRAVENOUS

## 2023-01-25 MED ORDER — SULFAMETHOXAZOLE-TRIMETHOPRIM 800-160 MG PO TABS: 1.0000 | ORAL_TABLET | Freq: Two times a day (BID) | ORAL | 0 refills | Status: DC

## 2023-01-25 MED ORDER — SODIUM CHLORIDE 0.9 % IV SOLN
1.0000 g | Freq: Once | INTRAVENOUS | Status: AC
  Administered 2023-01-25: 1 g via INTRAVENOUS
  Filled 2023-01-25: qty 10

## 2023-01-25 NOTE — ED Provider Notes (Signed)
[facility] Provider Note    Event Date/Time   First MD Initiated Contact with Patient 01/25/23 1654     (approximate)   History   Abscess   HPI  Erin Good is a 47 y.o. female with history of seizure disorder, type 2 diabetes, hypertension, and as listed in EMR presents to the emergency department for treatment and evaluation abscess on her buttock.  Incision and drainage performed 4 days ago.  She was evaluated by primary care and was sent to the emergency department due to 2 tachycardia, nausea, and vomiting. Abscess continues to drain malodorous fluid. Area has gotten smaller and less painful, but she feels overall unwell.    Physical Exam   Triage Vital Signs: ED Triage Vitals  Enc Vitals Group     BP 01/25/23 1449 (!) 152/118     Pulse Rate 01/25/23 1447 (!) 109     Resp 01/25/23 1447 18     Temp 01/25/23 1447 97.8 F (36.6 C)     Temp Source 01/25/23 1447 Oral     SpO2 01/25/23 1447 99 %     Weight --      Height --      Head Circumference --      Peak Flow --      Pain Score 01/25/23 1448 8     Pain Loc --      Pain Edu? --      Excl. in [place]? --     Most recent vital signs: Vitals:   01/25/23 1806 01/25/23 2021  BP: (!) 119/53 (!) 143/78  Pulse: 83 79  Resp: 16 18  Temp:    SpO2: 100% 97%    General: Awake, no distress.  CV:  Good peripheral perfusion.  Resp:  Normal effort.  Abd:  No distention.  Other:  Open, draining abscess on right buttock with no surrounding induration and scant amount of surrounding fluctuance.    ED Results / Procedures / Treatments   Labs (all labs ordered are listed, but only abnormal results are displayed) Labs Reviewed  POC URINE PREG, ED     EKG  Not indicated.   RADIOLOGY  CT abdomen pelvis with contrast shows no focal fluid collection.  There is some indication of cellulitis.  No soft tissue gas noted.   PROCEDURES:  Critical Care performed:  No  Procedures   MEDICATIONS ORDERED IN ED:  Medications  lactated ringers bolus 1,000 mL (1,000 mLs Intravenous New Bag/Given 01/25/23 1727)  cefTRIAXone (ROCEPHIN) 1 g in sodium chloride 0.9 % 100 mL IVPB (0 g Intravenous Stopped 01/25/23 1827)  ondansetron (ZOFRAN) injection 4 mg (4 mg Intravenous Given 01/25/23 1735)  iohexol (OMNIPAQUE) 300 MG/ML solution 100 mL (100 mLs Intravenous Contrast Given 01/25/23 1901)     IMPRESSION / MDM / ASSESSMENT AND PLAN / ED COURSE   I have reviewed the triage note.  Differential diagnosis includes, but is not limited to, abscess, cellulitis, necrotizing fasciitis  Patient's presentation is most consistent with acute illness / injury with system symptoms.  47 year old female presenting to the emergency department for evaluation of continued discomfort and nausea with vomiting due to pilonidal abscess drained 4 days ago.  See HPI for further details.  On exam, it does not seem that there is a superficial focal abscess.  Malodorous drainage is noted.  Upon arrival to the emergency department, she was slightly tachycardic but not febrile.  Patient was sent to the emergency department by primary  care due to concern for sepsis.  Outside records reviewed and labs were drawn prior to her arrival here.  She does have a mild leukocytosis but on review of past records this may be chronic.  BMP showed a potassium of 3.4 and a mildly elevated glucose.  Procalcitonin is normal.  No indication of sepsis.  CT of the pelvis shows no loculated fluid collection but there is some mild stranding in the subcu tissue indicating cellulitis.    While here, she received 1 g of Rocephin IV and a liter of fluids.  Tachycardia has resolved and her heart rate is now 83. Nausea and vomiting may be related to doxycycline. Plan will be to have her stop the doxycycline and start Bactrim.  She will also be given a prescription for Zofran.  She was instructed to follow-up with her  primary care provider.  ER return precautions also discussed.        FINAL CLINICAL IMPRESSION(S) / ED DIAGNOSES   Final diagnoses:  Cellulitis of buttock     Rx / DC Orders   ED Discharge Orders          Ordered    sulfamethoxazole-trimethoprim (BACTRIM DS) 800-160 MG tablet  2 times daily        01/25/23 1939    HYDROcodone-acetaminophen (NORCO/VICODIN) 5-325 MG tablet  Every 6 hours PRN        01/25/23 1939    ondansetron (ZOFRAN-ODT) 4 MG disintegrating tablet  Every 8 hours PRN        01/25/23 1939             Note:  This document was prepared using Dragon voice recognition software and may include unintentional dictation errors.   Victorino Dike, FNP 01/25/23 2029    Carrie Mew, MD 02/05/23 [phone]

## 2023-01-25 NOTE — Discharge Instructions (Addendum)
Stop the doxycycline and start the Bactrim.  Take the Norco for severe pain.  Follow up with PCP.  If symptoms change or worsen, please return to the ER if unable to see primary care.

## 2023-01-25 NOTE — ED Triage Notes (Signed)
Pt to ED via POV from PCP. Pt had abscess on buttocks lanced on Monday. Pt reports she is still feeling unwell. PCP sent pt due to N/V and tachycardia. Pt had CBC, BMP and procal drawn a few mins PTA.

## 2023-01-25 NOTE — ED Notes (Addendum)
POC Preg negative, CT notified

## 2023-01-31 ENCOUNTER — Other Ambulatory Visit: Payer: Self-pay | Admitting: Internal Medicine

## 2023-01-31 DIAGNOSIS — E1129 Type 2 diabetes mellitus with other diabetic kidney complication: Secondary | ICD-10-CM

## 2023-04-12 ENCOUNTER — Ambulatory Visit
Admission: RE | Admit: 2023-04-12 | Discharge: 2023-04-12 | Payer: 59 | Source: Ambulatory Visit | Attending: Family Medicine | Admitting: Family Medicine

## 2023-04-12 VITALS — BP 138/80 | HR 104 | Temp 97.9°F | Resp 20

## 2023-04-12 DIAGNOSIS — R1031 Right lower quadrant pain: Secondary | ICD-10-CM | POA: Diagnosis not present

## 2023-04-12 LAB — POCT URINALYSIS DIP (MANUAL ENTRY)
Bilirubin, UA: NEGATIVE
Blood, UA: NEGATIVE
Glucose, UA: NEGATIVE mg/dL
Ketones, POC UA: NEGATIVE mg/dL
Leukocytes, UA: NEGATIVE
Nitrite, UA: NEGATIVE
Protein Ur, POC: NEGATIVE mg/dL
Spec Grav, UA: >=1.03 — AB (ref 1.010–1.025)
Urobilinogen, UA: 0.2 E.U./dL
pH, UA: 5.5 (ref 5.0–8.0)

## 2023-04-12 LAB — POCT URINE PREGNANCY: Preg Test, Ur: NEGATIVE

## 2023-04-12 NOTE — Discharge Instructions (Signed)
Your pregnancy test was negative. The urinalysis was clear  I still think you should go to the emergency room for further evaluation since you are hurting so badly in the right lower quadrant.

## 2023-04-12 NOTE — ED Triage Notes (Signed)
Pt reports severe right side low  abdominal pain x 3 days.   Pt did take a pregnancy test and it was positive.

## 2023-04-12 NOTE — ED Provider Notes (Signed)
[name]    CSN: [id number] Arrival date & time: 04/12/23  1452      History   Chief Complaint Chief Complaint  Patient presents with   Abdominal Pain    Entered by patient    HPI Erin Good is a 47 y.o. female.    Abdominal Pain  Here for right lower quadrant pain that began on June 5.  Is been fairly intense and stinging.  No nausea or vomiting.  No fever or chills.  Last normal bowel movement was yesterday. No blood in the stool  Last normal menstrual cycle was April 28.  She did a urine pregnancy test that was positive.  She has had a tubal ligation in the past  Past Medical History:  Diagnosis Date   Allergic rhinitis    Breast discharge 06/05/2017   2 weeks ago left   Breast mass 12/06/2016   left   COVID-19 virus infection 11/2020   Diabetes mellitus without complication (HCC)    Dyslipidemia    Epilepsy (HCC)    Febrile seizures (HCC)    Galactorrhea    Hx gestational diabetes    Hypertension    Kidney stones    Morbid obesity (HCC)    Obesity    Seizures (HCC)    Sleep apnea    Syncope and collapse    Tachycardia     Patient Active Problem List   Diagnosis Date Noted   Colon cancer screening 09/26/2022   Diabetes mellitus without complication (HCC)    Morbid obesity (HCC)    Hypertension    COVID-19 virus infection 11/2020   Impingement syndrome of left shoulder region 04/20/2020   Vitamin D deficiency 01/31/2017   Right shoulder tendinitis 01/31/2017   Tendinosis 01/31/2017   Bell's palsy 12/19/2016   Tachycardia 09/30/2016   Depression with anxiety 05/06/2015   Allergic rhinitis 04/20/2015   Anxiety and depression 04/20/2015   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   Gastro-esophageal reflux disease without esophagitis 04/20/2015   Dysmetabolic syndrome 04/20/2015   NASH (nonalcoholic steatohepatitis) 04/20/2015   Allergy to nuts 04/20/2015   Calculus of kidney 04/20/2015   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   Central sleep apnea 11/26/2008   Dyslipidemia 07/02/2008   Leukocytosis 07/29/2007    Past Surgical History:  Procedure Laterality Date   BREAST BIOPSY Left 2018   benign   CESAREAN SECTION     X 2   COLONOSCOPY WITH PROPOFOL N/A 09/26/2022   Procedure: COLONOSCOPY WITH PROPOFOL;  Surgeon: Toney Reil, MD;  Location: [facility] ENDOSCOPY;  Service: Gastroenterology;  Laterality: N/A;   IRRIGATION AND DEBRIDEMENT SHOULDER Left 10/12/2020   Procedure: IRRIGATION AND DEBRIDEMENT SHOULDER;  Surgeon: Bjorn Pippin, MD;  Location: [facility] ORS;  Service: Orthopedics;  Laterality: Left;   RIGHT OOPHORECTOMY Right 2001   benign tumor   TUBAL LIGATION  2007    OB History     Gravida  2   Para  2   Term      Preterm  2   AB      Living  2      SAB      IAB      Ectopic      Multiple      Live Births  2        Obstetric Comments  Menstrual age: 18  Age 1st Pregnancy: 30  Home Medications    Prior to Admission medications   Medication Sig Start Date End Date Taking? Authorizing Provider  Cholecalciferol (VITAMIN D) 50 MCG (2000 UT) CAPS Take 1 capsule by mouth daily at 12 noon.    [provider]  clonazePAM (KLONOPIN) 0.5 MG tablet Take 0.5 mg by mouth daily as needed (Seizures).  08/14/16   [provider]  EPINEPHrine 0.3 mg/0.3 mL IJ SOAJ injection Inject 0.3 mg into the muscle as needed for anaphylaxis (for anaphylaxis).  06/14/14   [provider]  insulin glargine (LANTUS SOLOSTAR) 100 UNIT/ML Solostar Pen INJECT SUBCUTANEOUSLY 40 UNITS  DAILY 02/01/23   Carlus Pavlov, MD  Insulin Pen Needle 32G X 4 MM MISC Use 1x a day 07/03/19   Carlus Pavlov, MD  metFORMIN (GLUCOPHAGE-XR) 500 MG 24 hr tablet TAKE 4 TABLETS (2,000 MG TOTAL) BY MOUTH DAILY WITH SUPPER. 09/20/22   Carlus Pavlov, MD  omeprazole (PRILOSEC) 40 MG capsule Take 1 capsule (40 mg total) by mouth daily.  Before dinner 12/05/22   Alba Cory, MD  OneTouch Delica Lancets 33G MISC Use 2x a day with OneTouch Verio Flex 07/03/19   Carlus Pavlov, MD  [facility] VERIO test strip USE 2X A DAY WITH ONETOUCH VERIO FLEX 10/02/21   Carlus Pavlov, MD  rosuvastatin (CRESTOR) 20 MG tablet Take 1 tablet (20 mg total) by mouth daily. 10/31/20   Carlus Pavlov, MD  tirzepatide [facility]) 15 MG/0.5ML Pen Inject 15 mg into the skin once a week.    [provider]  tranexamic acid (LYSTEDA) 650 MG TABS tablet Take 2 tablets (1,300 mg total) by mouth 3 (three) times daily. Take during menses for a maximum of five days 12/29/21   Hildred Laser, MD    Family History Family History  Problem Relation Age of Onset   Diabetes Mother    Healthy Father    Cancer Maternal Grandmother    Breast cancer Paternal Grandmother 33   Cancer Paternal Grandmother    Cancer Paternal Aunt    Heart disease Neg Hx     Social History Social History   Tobacco Use   Smoking status: Never   Smokeless tobacco: Never  Vaping Use   Vaping Use: Never used  Substance Use Topics   Alcohol use: Not Currently    Comment: occ   Drug use: No     Allergies   Peanuts [peanut oil] and Aspirin   Review of Systems Review of Systems  Gastrointestinal:  Positive for abdominal pain.     Physical Exam Triage Vital Signs ED Triage Vitals [04/12/23 1503]  Enc Vitals Group     BP 138/80     Pulse Rate (!) 104     Resp 20     Temp 97.9 F (36.6 C)     Temp Source Oral     SpO2      Weight      Height      Head Circumference      Peak Flow      Pain Score 7     Pain Loc      Pain Edu?      Excl. in GC?    No data found.  Updated Vital Signs BP 138/80 (BP Location: Right Arm)   Pulse (!) 104   Temp 97.9 F (36.6 C) (Oral)   Resp 20   LMP 03/03/2023   Visual Acuity Right Eye Distance:   Left Eye Distance:   Bilateral Distance:  Right Eye Near:   Left Eye Near:    Bilateral Near:      Physical Exam Vitals reviewed.  Constitutional:      General: She is not in acute distress.    Appearance: She is not ill-appearing, toxic-appearing or diaphoretic.  HENT:     Mouth/Throat:     Mouth: Mucous membranes are moist.  Eyes:     Extraocular Movements: Extraocular movements intact.     Conjunctiva/sclera: Conjunctivae normal.     Pupils: Pupils are equal, round, and reactive to light.  Cardiovascular:     Rate and Rhythm: Regular rhythm. Tachycardia present.  Pulmonary:     Effort: Pulmonary effort is normal.     Breath sounds: Normal breath sounds.  Abdominal:     Palpations: Abdomen is soft.     Comments: There is a lot of tenderness in her right lower quadrant.  Skin:    Capillary Refill: Capillary refill takes less than 2 seconds.     Coloration: Skin is not jaundiced or pale.  Neurological:     General: No focal deficit present.     Mental Status: She is alert and oriented to person, place, and time.  Psychiatric:        Behavior: Behavior normal.      UC Treatments / Results  Labs (all labs ordered are listed, but only abnormal results are displayed) Labs Reviewed  POCT URINALYSIS DIP (MANUAL ENTRY) - Abnormal; Notable for the following components:      Result Value   Spec Grav, UA >=1.030 (*)    All other components within normal limits  POCT URINE PREGNANCY    EKG   Radiology No results found.  Procedures Procedures (including critical care time)  Medications Ordered in UC Medications - No data to display  Initial Impression / Assessment and Plan / UC Course  I have reviewed the triage vital signs and the nursing notes.  Pertinent labs & imaging results that were available during my care of the patient were reviewed by me and considered in my medical decision making (see chart for details).        UPT is negative and urinalysis is negative.  With her intense right lower quadrant pain and tenderness, I have asked her to proceed to  the emergency room for further evaluation Final Clinical Impressions(s) / UC Diagnoses   Final diagnoses:  None   Discharge Instructions   None    ED Prescriptions   None    PDMP not reviewed this encounter.   Zenia Resides, MD 04/12/23 1524

## 2023-04-12 NOTE — ED Notes (Signed)
Patient is being discharged from the Urgent Care and sent to the Emergency Department via POV . Per Dr. Marlinda Mike, patient is in need of higher level of care due to right lower quadrant pain. Patient is aware and verbalizes understanding of plan of care.  Vitals:   04/12/23 1503  BP: 138/80  Pulse: (!) 104  Resp: 20  Temp: 97.9 F (36.6 C)

## 2023-05-14 ENCOUNTER — Other Ambulatory Visit: Payer: Self-pay | Admitting: Internal Medicine

## 2023-05-21 ENCOUNTER — Ambulatory Visit: Payer: 59

## 2023-05-21 NOTE — Progress Notes (Unsigned)
    GYNECOLOGY PROGRESS NOTE  Subjective:    Patient ID: Erin Good, female    DOB: [birth year], 47 y.o.   MRN: [id number]  HPI  Patient is a 47 y.o. G73P0202 female who presents for evaluation of knot or mass on her vagina.  {Common ambulatory SmartLinks:19316}  Review of Systems {ros; complete:30496}   Objective:   There were no vitals taken for this visit. There is no height or weight on file to calculate BMI. General appearance: {general exam:16600} Abdomen: {abdominal exam:16834} Pelvic: {pelvic exam:16852::"cervix normal in appearance","external genitalia normal","no adnexal masses or tenderness","no cervical motion tenderness","rectovaginal septum normal","uterus normal size, shape, and consistency","vagina normal without discharge"} Extremities: {extremity exam:5109} Neurologic: {neuro exam:17854}   Assessment:   No diagnosis found.   Plan:   There are no diagnoses linked to this encounter.    Hildred Laser, MD [facility]

## 2023-05-22 ENCOUNTER — Ambulatory Visit (INDEPENDENT_AMBULATORY_CARE_PROVIDER_SITE_OTHER): Payer: 59 | Admitting: Obstetrics and Gynecology

## 2023-05-22 ENCOUNTER — Encounter: Payer: Self-pay | Admitting: Obstetrics and Gynecology

## 2023-05-22 VITALS — BP 118/84 | HR 118 | Resp 16 | Ht 67.0 in | Wt 261.5 lb

## 2023-05-22 DIAGNOSIS — R102 Pelvic and perineal pain: Secondary | ICD-10-CM | POA: Diagnosis not present

## 2023-05-22 DIAGNOSIS — N907 Vulvar cyst: Secondary | ICD-10-CM | POA: Diagnosis not present

## 2023-05-22 DIAGNOSIS — N8110 Cystocele, unspecified: Secondary | ICD-10-CM

## 2023-05-22 NOTE — Patient Instructions (Signed)
Kegel Exercises  Kegel exercises can help strengthen your pelvic floor muscles. The pelvic floor is a group of muscles that support your rectum, small intestine, and bladder. In females, pelvic floor muscles also help support the uterus. These muscles help you control the flow of urine and stool (feces). Kegel exercises are painless and simple. They do not require any equipment. Your provider may suggest Kegel exercises to: Improve bladder and bowel control. Improve sexual response. Improve weak pelvic floor muscles after surgery to remove the uterus (hysterectomy) or after pregnancy, in females. Improve weak pelvic floor muscles after prostate gland removal or surgery, in males. Kegel exercises involve squeezing your pelvic floor muscles. These are the same muscles you squeeze when you try to stop the flow of urine or keep from passing gas. The exercises can be done while sitting, standing, or lying down, but it is best to vary your position. Ask your health care provider which exercises are safe for you. Do exercises exactly as told by your health care provider and adjust them as directed. Do not begin these exercises until told by your health care provider. Exercises How to do Kegel exercises: Squeeze your pelvic floor muscles tight. You should feel a tight lift in your rectal area. If you are a female, you should also feel a tightness in your vaginal area. Keep your stomach, buttocks, and legs relaxed. Hold the muscles tight for up to 10 seconds. Breathe normally. Relax your muscles for up to 10 seconds. Repeat as told by your health care provider. Repeat this exercise daily as told by your health care provider. Continue to do this exercise for at least 4-6 weeks, or for as long as told by your health care provider. You may be referred to a physical therapist who can help you learn more about how to do Kegel exercises. Depending on your condition, your health care provider may  recommend: Varying how long you squeeze your muscles. Doing several sets of exercises every day. Doing exercises for several weeks. Making Kegel exercises a part of your regular exercise routine. This information is not intended to replace advice given to you by your health care provider. Make sure you discuss any questions you have with your health care provider. Document Revised: 03/02/2021 Document Reviewed: 03/02/2021 Elsevier Patient Education  2024 Elsevier Inc.  

## 2023-05-23 ENCOUNTER — Telehealth: Payer: 59 | Admitting: Physician Assistant

## 2023-05-23 DIAGNOSIS — U071 COVID-19: Secondary | ICD-10-CM | POA: Diagnosis not present

## 2023-05-23 MED ORDER — BENZONATATE 100 MG PO CAPS: 100.0000 mg | ORAL_CAPSULE | Freq: Three times a day (TID) | ORAL | 0 refills | Status: DC | PRN

## 2023-05-23 MED ORDER — NIRMATRELVIR/RITONAVIR (PAXLOVID)TABLET: 3.0000 | ORAL_TABLET | Freq: Two times a day (BID) | ORAL | 0 refills | Status: AC

## 2023-05-23 NOTE — Progress Notes (Signed)
Virtual Visit Consent   Erin Good, you are scheduled for a virtual visit with a [facility] provider today. Just as with appointments in the office, your consent must be obtained to participate. Your consent will be active for this visit and any virtual visit you may have with one of our providers in the next 365 days. If you have a MyChart account, a copy of this consent can be sent to you electronically.  As this is a virtual visit, video technology does not allow for your provider to perform a traditional examination. This may limit your provider's ability to fully assess your condition. If your provider identifies any concerns that need to be evaluated in person or the need to arrange testing (such as labs, EKG, etc.), we will make arrangements to do so. Although advances in technology are sophisticated, we cannot ensure that it will always work on either your end or our end. If the connection with a video visit is poor, the visit may have to be switched to a telephone visit. With either a video or telephone visit, we are not always able to ensure that we have a secure connection.  By engaging in this virtual visit, you consent to the provision of healthcare and authorize for your insurance to be billed (if applicable) for the services provided during this visit. Depending on your insurance coverage, you may receive a charge related to this service.  I need to obtain your verbal consent now. Are you willing to proceed with your visit today? Marvalene Barrett Gant-Pegues has provided verbal consent on 05/23/2023 for a virtual visit (video or telephone). Piedad Climes, [place]  Date: 05/23/2023 12:00 PM  Virtual Visit via Video Note   I, Piedad Climes, connected with  LILLIANNE EICK  ([id number], [birth year]) on 05/23/23 at 12:00 PM EDT by a video-enabled telemedicine application and verified that I am speaking with the correct person using two identifiers.  Location: Patient:  Virtual Visit Location Patient: Home Provider: Virtual Visit Location Provider: Home Office   I discussed the limitations of evaluation and management by telemedicine and the availability of in person appointments. The patient expressed understanding and agreed to proceed.    History of Present Illness: MAHIRA PETRAS is a 47 y.o. who identifies as a female who was assigned female at birth, and is being seen today for COVID-19. Endorses symptoms starting 2 days ago with nasal congestion, headache, fatigue. Now with more substantial cough and low-grade fever. This has been associated with sweats and chills. Denies chest pain or SOB. Denies GI issues. Daughter COVID positive with symptoms starting before her. Took COVID test last night at home which was positive.   OTC -- Aleve.    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   Gastro-esophageal reflux disease without esophagitis 04/20/2015   Dysmetabolic syndrome 04/20/2015   NASH (nonalcoholic steatohepatitis) 04/20/2015   Allergy to nuts 04/20/2015   Calculus  of kidney 04/20/2015   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   Central sleep apnea 11/26/2008   Dyslipidemia 07/02/2008   Leukocytosis 07/29/2007    Allergies:  Allergies  Allergen Reactions   Peanuts [Peanut Oil] Anaphylaxis   Aspirin Other (See Comments)    Does not take because of her  epilepsy/seizure    Medications:  Current Outpatient Medications:    Cholecalciferol (VITAMIN D) 50 MCG (2000 UT) CAPS, Take 1 capsule by mouth daily at 12 noon., Disp: , Rfl:    clonazePAM (KLONOPIN) 0.5 MG tablet, Take 0.5 mg by mouth daily as needed (Seizures). , Disp: , Rfl: 3   EPINEPHrine 0.3 mg/0.3 mL IJ SOAJ injection, Inject 0.3 mg into the muscle as needed for anaphylaxis (for anaphylaxis). , Disp: , Rfl:    insulin glargine (LANTUS SOLOSTAR) 100 UNIT/ML Solostar Pen, INJECT SUBCUTANEOUSLY 40 UNITS  DAILY, Disp: 45 mL, Rfl: 3   Insulin Pen Needle 32G X 4 MM MISC, Use 1x a day, Disp: 100 each, Rfl: 3   metFORMIN (GLUCOPHAGE-XR) 500 MG 24 hr tablet, TAKE 4 TABLETS (2,000 MG TOTAL) BY MOUTH DAILY WITH SUPPER., Disp: 120 tablet, Rfl: 5   MOUNJARO 15 MG/0.5ML Pen, INJECT THE CONTENTS OF ONE PEN  SUBCUTANEOUSLY WEEKLY AS  DIRECTED, Disp: 6 mL, Rfl: 3   omeprazole (PRILOSEC) 40 MG capsule, Take 1 capsule (40 mg total) by mouth daily. Before dinner, Disp: 30 capsule, Rfl: 0   OneTouch Delica Lancets 33G MISC, Use 2x a day with OneTouch Verio Flex, Disp: 200 each, Rfl: 3   ONETOUCH VERIO test strip, USE 2X A DAY WITH ONETOUCH VERIO FLEX, Disp: 50 strip, Rfl: 15   rosuvastatin (CRESTOR) 20 MG tablet, Take 1 tablet (20 mg total) by mouth daily., Disp: 90 tablet, Rfl: 3   tranexamic acid (LYSTEDA) 650 MG TABS tablet, Take 2 tablets (1,300 mg total) by mouth 3 (three) times daily. Take during menses for a maximum of five days, Disp: 30 tablet, Rfl: 2  Observations/Objective: Patient is well-developed, well-nourished in no acute distress.  Resting comfortably at home.  Head is normocephalic, atraumatic.  No labored breathing.  Speech is clear and coherent with logical content.  Patient is alert and oriented at baseline.  Assessment and Plan: 1. COVID-19  Patient with multiple risk factors for complicated course of illness. Discussed risks/benefits of antiviral medications including most  common potential ADRs. Patient voiced understanding and would like to proceed with antiviral medication. They are candidate for Paxlovid. Rx sent to pharmacy. Supportive measures, OTC medications and vitamin regimen reviewed. Tessalon per orders. Quarantine reviewed in detail. Strict ER precautions discussed with patient.    Follow Up Instructions: I discussed the assessment and treatment plan with the patient. The patient was provided an opportunity to ask questions and all were answered. The patient agreed with the plan and demonstrated an understanding of the instructions.  A copy of instructions were sent to the patient via MyChart unless otherwise noted below.   The patient was advised to call back or seek an in-person evaluation if the symptoms worsen or if the condition fails to improve as anticipated.  Time:  I spent 10 minutes with the patient via telehealth technology discussing the above problems/concerns.    Piedad Climes, PA-C

## 2023-05-23 NOTE — Patient Instructions (Addendum)
Erin Good, thank you for joining Piedad Climes, PA-C for today's virtual visit.  While this provider is not your primary care provider (PCP), if your PCP is located in our provider database this encounter information will be shared with them immediately following your visit.   A [facility] MyChart account gives you access to today's visit and all your visits, tests, and labs performed at [facility] " click here if you don't have a [facility] MyChart account or go to [email or web address]  Consent: (Patient) Erin Good provided verbal consent for this virtual visit at the beginning of the encounter.  Current Medications:  Current Outpatient Medications:    benzonatate (TESSALON) 100 MG capsule, Take 1 capsule (100 mg total) by mouth 3 (three) times daily as needed for cough., Disp: 30 capsule, Rfl: 0   nirmatrelvir/ritonavir (PAXLOVID) 20 x 150 MG & 10 x 100MG  TABS, Take 3 tablets by mouth 2 (two) times daily for 5 days. (Take nirmatrelvir 150 mg two tablets twice daily for 5 days and ritonavir 100 mg one tablet twice daily for 5 days) Patient GFR is > 60., Disp: 30 tablet, Rfl: 0   Cholecalciferol (VITAMIN D) 50 MCG (2000 UT) CAPS, Take 1 capsule by mouth daily at 12 noon., Disp: , Rfl:    clonazePAM (KLONOPIN) 0.5 MG tablet, Take 0.5 mg by mouth daily as needed (Seizures). , Disp: , Rfl: 3   EPINEPHrine 0.3 mg/0.3 mL IJ SOAJ injection, Inject 0.3 mg into the muscle as needed for anaphylaxis (for anaphylaxis). , Disp: , Rfl:    insulin glargine (LANTUS SOLOSTAR) 100 UNIT/ML Solostar Pen, INJECT SUBCUTANEOUSLY 40 UNITS  DAILY, Disp: 45 mL, Rfl: 3   Insulin Pen Needle 32G X 4 MM MISC, Use 1x a day, Disp: 100 each, Rfl: 3   metFORMIN (GLUCOPHAGE-XR) 500 MG 24 hr tablet, TAKE 4 TABLETS (2,000 MG TOTAL) BY MOUTH DAILY WITH SUPPER., Disp: 120 tablet, Rfl: 5   MOUNJARO 15 MG/0.5ML Pen, INJECT THE CONTENTS OF ONE PEN  SUBCUTANEOUSLY WEEKLY AS  DIRECTED,  Disp: 6 mL, Rfl: 3   omeprazole (PRILOSEC) 40 MG capsule, Take 1 capsule (40 mg total) by mouth daily. Before dinner, Disp: 30 capsule, Rfl: 0   OneTouch Delica Lancets 33G MISC, Use 2x a day with OneTouch Verio Flex, Disp: 200 each, Rfl: 3   ONETOUCH VERIO test strip, USE 2X A DAY WITH ONETOUCH VERIO FLEX, Disp: 50 strip, Rfl: 15   rosuvastatin (CRESTOR) 20 MG tablet, Take 1 tablet (20 mg total) by mouth daily., Disp: 90 tablet, Rfl: 3   tranexamic acid (LYSTEDA) 650 MG TABS tablet, Take 2 tablets (1,300 mg total) by mouth 3 (three) times daily. Take during menses for a maximum of five days, Disp: 30 tablet, Rfl: 2   Medications ordered in this encounter:  Meds ordered this encounter  Medications   nirmatrelvir/ritonavir (PAXLOVID) 20 x 150 MG & 10 x 100MG  TABS    Sig: Take 3 tablets by mouth 2 (two) times daily for 5 days. (Take nirmatrelvir 150 mg two tablets twice daily for 5 days and ritonavir 100 mg one tablet twice daily for 5 days) Patient GFR is > 60.    Dispense:  30 tablet    Refill:  0    Order Specific Question:   Supervising Provider    Answer:   Merrilee Jansky [[id number]]   benzonatate (TESSALON) 100 MG capsule    Sig: Take 1 capsule (100 mg total) by mouth 3 (three)  times daily as needed for cough.    Dispense:  30 capsule    Refill:  0    Order Specific Question:   Supervising Provider    Answer:   Merrilee Jansky [id number]     *If you need refills on other medications prior to your next appointment, please contact your pharmacy*  Follow-Up: Call back or seek an in-person evaluation if the symptoms worsen or if the condition fails to improve as anticipated.  [facility] [phone]  Care Instructions: Please keep well-hydrated and get plenty of rest. Start a saline nasal rinse to flush out your nasal passages. You can use plain Mucinex to help thin congestion. Use the Tessalon as directed for cough. Remember to hold off on taking your  Rosuvastatin while on the Paxlovid and for 5 additional days. Then you can restart daily.  If you have a humidifier, running in the bedroom at night. I want you to start OTC vitamin D3 1000 units daily, vitamin C 1000 mg daily, and a zinc supplement. Please take prescribed medications as directed.   Isolation Instructions: You are to isolate at home until you have been fever free for at least 24 hours without a fever-reducing medication, and symptoms have been steadily improving for 24 hours. At that time,  you can end isolation but need to mask for an additional 5 days.   If you must be around other household members who do not have symptoms, you need to make sure that both you and the family members are masking consistently with a high-quality mask.  If you note any worsening of symptoms despite treatment, please seek an in-person evaluation ASAP. If you note any significant shortness of breath or any chest pain, please seek ER evaluation. Please do not delay care!   COVID-19: What to Do if You Are Sick If you test positive and are an older adult or someone who is at high risk of getting very sick from COVID-19, treatment may be available. Contact a healthcare provider right away after a positive test to determine if you are eligible, even if your symptoms are mild right now. You can also visit a Test to Treat location and, if eligible, receive a prescription from a provider. Don't delay: Treatment must be started within the first few days to be effective. If you have a fever, cough, or other symptoms, you might have COVID-19. Most people have mild illness and are able to recover at home. If you are sick: Keep track of your symptoms. If you have an emergency warning sign (including trouble breathing), call 911. Steps to help prevent the spread of COVID-19 if you are sick If you are sick with COVID-19 or think you might have COVID-19, follow the steps below to care for yourself and to help  protect other people in your home and community. Stay home except to get medical care Stay home. Most people with COVID-19 have mild illness and can recover at home without medical care. Do not leave your home, except to get medical care. Do not visit public areas and do not go to places where you are unable to wear a mask. Take care of yourself. Get rest and stay hydrated. Take over-the-counter medicines, such as acetaminophen, to help you feel better. Stay in touch with your doctor. Call before you get medical care. Be sure to get care if you have trouble breathing, or have any other emergency warning signs, or if you think it is an  emergency. Avoid public transportation, ride-sharing, or taxis if possible. Get tested If you have symptoms of COVID-19, get tested. While waiting for test results, stay away from others, including staying apart from those living in your household. Get tested as soon as possible after your symptoms start. Treatments may be available for people with COVID-19 who are at risk for becoming very sick. Don't delay: Treatment must be started early to be effective--some treatments must begin within 5 days of your first symptoms. Contact your healthcare provider right away if your test result is positive to determine if you are eligible. Self-tests are one of several options for testing for the virus that causes COVID-19 and may be more convenient than laboratory-based tests and point-of-care tests. Ask your healthcare provider or your local health department if you need help interpreting your test results. You can visit your state, tribal, local, and territorial health department's website to look for the latest local information on testing sites. Separate yourself from other people As much as possible, stay in a specific room and away from other people and pets in your home. If possible, you should use a separate bathroom. If you need to be around other people or animals in or  outside of the home, wear a well-fitting mask. Tell your close contacts that they may have been exposed to COVID-19. An infected person can spread COVID-19 starting 48 hours (or 2 days) before the person has any symptoms or tests positive. By letting your close contacts know they may have been exposed to COVID-19, you are helping to protect everyone. See COVID-19 and Animals if you have questions about pets. If you are diagnosed with COVID-19, someone from the health department may call you. Answer the call to slow the spread. Monitor your symptoms Symptoms of COVID-19 include fever, cough, or other symptoms. Follow care instructions from your healthcare provider and local health department. Your local health authorities may give instructions on checking your symptoms and reporting information. When to seek emergency medical attention Look for emergency warning signs* for COVID-19. If someone is showing any of these signs, seek emergency medical care immediately: Trouble breathing Persistent pain or pressure in the chest New confusion Inability to wake or stay awake Pale, gray, or blue-colored skin, lips, or nail beds, depending on skin tone *This list is not all possible symptoms. Please call your medical provider for any other symptoms that are severe or concerning to you. Call 911 or call ahead to your local emergency facility: Notify the operator that you are seeking care for someone who has or may have COVID-19. Call ahead before visiting your doctor Call ahead. Many medical visits for routine care are being postponed or done by phone or telemedicine. If you have a medical appointment that cannot be postponed, call your doctor's office, and tell them you have or may have COVID-19. This will help the office protect themselves and other patients. If you are sick, wear a well-fitting mask You should wear a mask if you must be around other people or animals, including pets (even at home). Wear  a mask with the best fit, protection, and comfort for you. You don't need to wear the mask if you are alone. If you can't put on a mask (because of trouble breathing, for example), cover your coughs and sneezes in some other way. Try to stay at least 6 feet away from other people. This will help protect the people around you. Masks should not be placed on young children under  age 18 years, anyone who has trouble breathing, or anyone who is not able to remove the mask without help. Cover your coughs and sneezes Cover your mouth and nose with a tissue when you cough or sneeze. Throw away used tissues in a lined trash can. Immediately wash your hands with soap and water for at least 20 seconds. If soap and water are not available, clean your hands with an alcohol-based hand sanitizer that contains at least 60% alcohol. Clean your hands often Wash your hands often with soap and water for at least 20 seconds. This is especially important after blowing your nose, coughing, or sneezing; going to the bathroom; and before eating or preparing food. Use hand sanitizer if soap and water are not available. Use an alcohol-based hand sanitizer with at least 60% alcohol, covering all surfaces of your hands and rubbing them together until they feel dry. Soap and water are the best option, especially if hands are visibly dirty. Avoid touching your eyes, nose, and mouth with unwashed hands. Handwashing Tips Avoid sharing personal household items Do not share dishes, drinking glasses, cups, eating utensils, towels, or bedding with other people in your home. Wash these items thoroughly after using them with soap and water or put in the dishwasher. Clean surfaces in your home regularly Clean and disinfect high-touch surfaces (for example, doorknobs, tables, handles, light switches, and countertops) in your "sick room" and bathroom. In shared spaces, you should clean and disinfect surfaces and items after each use by the  person who is ill. If you are sick and cannot clean, a caregiver or other person should only clean and disinfect the area around you (such as your bedroom and bathroom) on an as needed basis. Your caregiver/other person should wait as long as possible (at least several hours) and wear a mask before entering, cleaning, and disinfecting shared spaces that you use. Clean and disinfect areas that may have blood, stool, or body fluids on them. Use household cleaners and disinfectants. Clean visible dirty surfaces with household cleaners containing soap or detergent. Then, use a household disinfectant. Use a product from [employer] List N: Disinfectants for Coronavirus (COVID-19). Be sure to follow the instructions on the label to ensure safe and effective use of the product. Many products recommend keeping the surface wet with a disinfectant for a certain period of time (look at "contact time" on the product label). You may also need to wear personal protective equipment, such as gloves, depending on the directions on the product label. Immediately after disinfecting, wash your hands with soap and water for 20 seconds. For completed guidance on cleaning and disinfecting your home, visit Complete Disinfection Guidance. Take steps to improve ventilation at home Improve ventilation (air flow) at home to help prevent from spreading COVID-19 to other people in your household. Clear out COVID-19 virus particles in the air by opening windows, using air filters, and turning on fans in your home. Use this interactive tool to learn how to improve air flow in your home. When you can be around others after being sick with COVID-19 Deciding when you can be around others is different for different situations. Find out when you can safely end home isolation. For any additional questions about your care, contact your healthcare provider or state or local health department. 01/24/2021 Content source: [facility] for  Immunization and Respiratory Diseases ([facility]), Division of Viral Diseases This information is not intended to replace advice given to you by your health care provider. Make sure  you discuss any questions you have with your health care provider. Document Revised: 03/09/2021 Document Reviewed: 03/09/2021 Elsevier Patient Education  2022 [employer].     If you have been instructed to have an in-person evaluation today at a local Urgent Care facility, please use the link below. It will take you to a list of all of our available [facility], including address, phone number and hours of operation. Please do not delay care.  [facility]  If you or a family member do not have a primary care provider, use the link below to schedule a visit and establish care. When you choose a [facility] primary care physician or advanced practice provider, you gain a long-term partner in health. Find a Primary Care Provider  Learn more about [facility] in-office and virtual care options: [facility] - Get Care Now

## 2023-07-04 ENCOUNTER — Ambulatory Visit (INDEPENDENT_AMBULATORY_CARE_PROVIDER_SITE_OTHER): Payer: 59 | Admitting: Internal Medicine

## 2023-07-04 ENCOUNTER — Encounter: Payer: Self-pay | Admitting: Internal Medicine

## 2023-07-04 VITALS — BP 126/70 | HR 100 | Ht 67.0 in | Wt 261.2 lb

## 2023-07-04 DIAGNOSIS — R809 Proteinuria, unspecified: Secondary | ICD-10-CM | POA: Diagnosis not present

## 2023-07-04 DIAGNOSIS — E1129 Type 2 diabetes mellitus with other diabetic kidney complication: Secondary | ICD-10-CM

## 2023-07-04 DIAGNOSIS — E042 Nontoxic multinodular goiter: Secondary | ICD-10-CM | POA: Diagnosis not present

## 2023-07-04 DIAGNOSIS — Z794 Long term (current) use of insulin: Secondary | ICD-10-CM | POA: Diagnosis not present

## 2023-07-04 DIAGNOSIS — E785 Hyperlipidemia, unspecified: Secondary | ICD-10-CM | POA: Diagnosis not present

## 2023-07-04 DIAGNOSIS — Z7985 Long-term (current) use of injectable non-insulin antidiabetic drugs: Secondary | ICD-10-CM

## 2023-07-04 DIAGNOSIS — Z7984 Long term (current) use of oral hypoglycemic drugs: Secondary | ICD-10-CM

## 2023-07-04 LAB — LIPID PANEL
Cholesterol: 201 mg/dL — ABNORMAL HIGH (ref 0–200)
HDL: 41.5 mg/dL (ref >39.00)
LDL Cholesterol: 136 mg/dL — ABNORMAL HIGH (ref 0–99)
NonHDL: 159.89
Total CHOL/HDL Ratio: 5
Triglycerides: 121 mg/dL (ref 0.0–149.0)
VLDL: 24.2 mg/dL (ref 0.0–40.0)

## 2023-07-04 LAB — POCT GLYCOSYLATED HEMOGLOBIN (HGB A1C): Hemoglobin A1C: 5.9 % — AB (ref 4.0–5.6)

## 2023-07-04 LAB — MICROALBUMIN / CREATININE URINE RATIO
Creatinine,U: 155.6 mg/dL
Microalb Creat Ratio: 1.5 mg/g (ref 0.0–30.0)
Microalb, Ur: 2.4 mg/dL — ABNORMAL HIGH (ref 0.0–1.9)

## 2023-07-04 MED ORDER — METFORMIN HCL ER 500 MG PO TB24: 2000.0000 mg | ORAL_TABLET | Freq: Every day | ORAL | 3 refills | Status: DC

## 2023-07-04 MED ORDER — LANTUS SOLOSTAR 100 UNIT/ML ~~LOC~~ SOPN: PEN_INJECTOR | SUBCUTANEOUS | Status: DC

## 2023-07-04 NOTE — Patient Instructions (Addendum)
Please continue: - Metformin ER 2000 mg with dinner - Mounjaro 15 mg weekly - Lantus 30 units at bedtime  Please return in 6 months with your sugar log.

## 2023-07-04 NOTE — Progress Notes (Addendum)
Patient ID: Erin Good, female   DOB: [birth year], 47 y.o.   MRN: [id number]   HPI: Erin Good is a 47 y.o.-year-old female, initially referred by her PCP, Dr. Carlynn Purl, presenting for follow-up for DM2, dx in ~2015, insulin-dependent since 2019, uncontrolled, with complications (diabetic retinopathy, microalbuminuria) and also for thyroid nodules.  Last visit 6 months ago.  Interim history: No increased urination, blurry vision, nausea, chest pain.  She lost approximately 50 pounds after starting [facility] before last visit.  Since then, she lost another 11 pounds. She had COVID-19 last month.  Sugars are higher then.  Reviewed HbA1c levels: Lab Results  Component Value Date   HGBA1C 5.8 (A) 01/03/2023   HGBA1C 6.2 (A) 09/04/2022   HGBA1C 6.5 (A) 04/04/2022   HGBA1C 7.8 (A) 12/01/2021   HGBA1C 9.7 (A) 09/01/2021   HGBA1C 7.6 (H) 10/03/2020   HGBA1C 7.9 (A) 08/15/2020   HGBA1C 9.0 (H) 06/30/2020   HGBA1C 8.3 (A) 05/30/2020   HGBA1C 8.0 (A) 11/12/2019   Pt was on a regimen of: - Metformin ER 750 mg 2x a day, with meals - Soliqua (Lantus + Lixisenatide) 50 units daily in am  Currently on: - Metformin ER 750 mg 2x a day with meals >> 1500 >> 2000 mg with dinner - Lantus 50 >> 56 units at bedtime - occas. Skipping 2/2 lows >> 40 >> 30 units daily - >> Mounjaro 7.5 >> 10 >> 12.5 >> 15 mg weekly - was out for 1 mo in 04/2023, now restarted Tried Farxiga 06/2020 >> not covered.  She checks her sugars 1-2 times a day-no log, no meter: - am:  110, 120-130s, 160 >> 110-130 >> 87-110 >> 90-110, 120 - 2h after b'fast:85-130 >> n/c >> 166, 207 >> n/c >> 150-158 >> n/c - before lunch:  92-180s >> n/c >> 120-140 >> 110-140, 150 >> n/c - 2h after lunch: 160s-210 >> 125-153 >> n/c >> 150s >> 140s - before dinner: 70s, 120-140 >> 110-140, 150 >> 60 (flu) >> n/c - 2h after dinner: n/c >> 130-137, 180 - bedtime: 1 170-330 >> N/c >> 76 if skips the meal >> n/c - nighttime:  n/c Lowest sugar was 70s >> 76 >> 60 >> 90; she has hypoglycemia awareness at 100. Highest sugar was 330 >> .Marland Kitchen. 158 >> 180 (Covid)  Glucometer: [facility] advance  Pt's meals are: - Breakfast: 2 eggs + 3 pieces of bacon >> 2 eggs - snack: fruit - Lunch: salad - Dinner:meat + veggie + starch (rice + pasta) - Snacks: 1  Reducing sodas: Coke >> sparkling water. I did advise her to stop juice in the past >> off now.   -No CKD, last BUN/creatinine:  Lab Results  Component Value Date   BUN 11 01/25/2023   BUN 12 06/23/2022   CREATININE 0.91 01/25/2023   CREATININE 0.78 06/23/2022  Not on ACE inhibitor/ARB.  ACR levels are high: Lab Results  Component Value Date   MICRALBCREAT 1.6 12/01/2021   MICRALBCREAT 3.9 10/27/2020   MICRALBCREAT 33 (H) 11/12/2019   MICRALBCREAT 30 (H) 01/29/2019   -+ HL; last set of lipids: Lab Results  Component Value Date   CHOL 210 (H) 12/01/2021   HDL 34.80 (L) 12/01/2021   LDLCALC 148 (H) 12/01/2021   TRIG 133.0 12/01/2021   CHOLHDL 6 12/01/2021  At last visit, she was on Crestor 10 mg daily and I advised her to increase the dose to 20 mg daily. She takes this now.  -  last eye exam was in 07/2022: ? DR (need to get the report).  - no numbness and tingling in her feet.  Occasional numbness in the right leg and foot.  Foot Exam 04/04/2022.  Pt has FH of DM in mother, MGF.  Thyroid nodules:  Reviewed previous work-up: 06/01/2019: Thyroid ultrasound: Parenchymal Echotexture: Mildly heterogenous Isthmus: 0.4 cm thickness Right lobe: 5 x 2.4 x 2.8 cm Left lobe: 4.5 x 2 x 2.1 cm    Estimated total number of nodules >/= 1 cm: 4   Nodule # 1: Location: Right; Mid Maximum size: 1.6 cm; Other 2 dimensions: 1.5 x 1.4 cm Composition: mixed cystic and solid (1) Echogenicity: hypoechoic (2) *Given size (>/= 1.5 - 2.4 cm) and appearance, a follow-up ultrasound in 1 year should be considered based on TI-RADS  criteria. _________________________________________________________   Nodule # 2: Location: Right; Inferior Maximum size: 2.3 cm; Other 2 dimensions: 1.9 x 1.9 cm Composition: solid/almost completely solid (2) Echogenicity: hypoechoic (2)  **Given size (>/= 1.5 cm) and appearance, fine needle aspiration of this moderately suspicious nodule should be considered based on TI-RADS criteria. _________________________________________________   Nodule # 3: Location: Left; Inferior Maximum size: 1.4 cm; Other 2 dimensions: 1.2 x 1.2 cm Composition: solid/almost completely solid (2) Echogenicity: isoechoic (1)  Given size (<1.4 cm) and appearance, this nodule does NOT meet TI-RADS criteria for biopsy or dedicated follow-up. _________________________________________________________   Nodule # 4: Location: Left; Mid Maximum size: 1.8 cm; Other 2 dimensions: 1.6 x 1.3 cm Composition: solid/almost completely solid (2) Echogenicity: hypoechoic (2) **Given size (>/= 1.5 cm) and appearance, fine needle aspiration of this moderately suspicious nodule should be considered based on TI-RADS criteria.   IMPRESSION: 1. Thyromegaly with bilateral nodules. 2. Recommend FNA biopsy of moderately suspicious 1.8 cm mid left AND 2.3 cm inferior right nodules. 3. Recommend annual/biennial ultrasound follow-up of additional nodules as above, until stability x5 years confirmed.  08/05/2019: FNA of the 2 nodules: Clinical History: Right inferior 2.3cm; Other 2 dimensions: 1.9 x 1.9cm,  Solid / almost completely solid, Hypoechoic, TI-RADS total points 4  Specimen Submitted:  A. THYROID, RLP, FINE NEEDLE ASPIRATION:  DIAGNOSIS:  - Consistent with benign follicular nodule (Bethesda category II)  SPECIMEN ADEQUACY:  Satisfactory for evaluation   Clinical History: Left mid 1.8cm; Other 2 dimensions: 1.6 x 1.3cm, Solid  / almost completely solid, Hypoechoic, TI-RADS total points 4  Specimen Submitted:  A.  THYROID, LMP, FINE NEEDLE  ASPIRATION:  DIAGNOSIS:  - Consistent with benign follicular nodule (Bethesda category II)  SPECIMEN ADEQUACY:  Satisfactory for evaluation  Thyroid U/S (09/18/2021): Parenchymal Echotexture: Mildly heterogenous Isthmus: 0.5 cm, previously 0.4 cm Right lobe: 6.1 x 2.2 x 2.8 cm, previously 5.0 x 2.4 x 2.8 cm Left lobe: 5.3 x 2.3 x 2.4 cm, previously 4.5 x 2.0 x 2.1 cm _________________________________________________________   Estimated total number of nodules >/= 1 cm: 5 _________________________________________________________   Nodule # 1: Location: Right; Superior Maximum size: 2.0 cm; Other 2 dimensions: 1.7 x 1.7 cm, previously 1.6 x 1.5 x 1.4 cm Composition: mixed cystic and solid (1) Echogenicity: isoechoic (1) This nodule does NOT meet TI-RADS criteria for biopsy or dedicated follow-up. _________________________________________________________   Nodule # 2: Location: Right; Mid Maximum size: 2.9 cm; Other 2 dimensions: 2.2 x 1.7 cm, previously 2.3 x 1.9 x 1.9 cm This nodule is mixed cystic and solid, and was previously biopsied.  _________________________________________________________   Nodule # 3: Location: Right; Inferior Maximum size: 1.5 cm; Other 2  dimensions: 1.4 x 0.9 cm Composition: solid/almost completely solid (2) Echogenicity: isoechoic (1) *Given size (>/= 1.5 - 2.4 cm) and appearance, a follow-up ultrasound in 1 year should be considered based on TI-RADS criteria. _________________________________________________________   Nodule # 4: Location: Left; Mid Maximum size: 2.5 cm; Other 2 dimensions: 2.0 x 1.5 cm, previously 1.8 x 1.6 x 1.2 cm This nodule is mixed cystic and solid, and was previously biopsied. _________________________________________________________   Nodule # 5: Location: Left; Inferior Maximum size: 1.5 cm; Other 2 dimensions: 1.2 x 1.0 cm, previously 1.4 x 1.2 x 1.2 cm Composition: solid/almost  completely solid (2) Echogenicity: isoechoic (1) *Given size (>/= 1.5 - 2.4 cm) and appearance, a follow-up ultrasound in 1 year should be considered based on TI-RADS criteria.  _________________________________________________________   No cervical lymphadenopathy, within the imaged neck   IMPRESSION: 1. Multinodular thyroid gland, with similar appearance of previously-biopsied nodules as above. 2. 1.5 cm bilateral TR 3 nodules. Attention on follow-up.  *Given size (>/= 1.5 - 2.4 cm) and appearance, a follow-up ultrasound in 1 year should be considered based on TI-RADS criteria.  Thyroid U/S (09/06/2022): Parenchymal Echotexture: Mildly heterogenous  Isthmus: 0.4 cm  Right lobe: 6.0 x 2.3 x 3.1 cm  Left lobe: 5.1 x 2.3 x 2.4 cm  _________________________________________________________   Estimated total number of nodules >/= 1 cm: 5 _________________________________________________________   Nodule # 1:  Location: RIGHT; Superior  Maximum size: 2.1 cm; Other 2 dimensions: 1.9 x 2.5 cm, previously 2.0 x 1.7 x 1.7 cm  Composition: mixed cystic and solid (1) Echogenicity: isoechoic (1)  This nodule does NOT meet TI-RADS criteria for biopsy or dedicated follow-up.  _________________________________________________________   Nodule # 2:  Location: RIGHT; Mid/inferior  Maximum size: 3.1 cm; Other 2 dimensions: 2.5 x 1.9 cm  No aggressive features on today's evaluation. This nodule appears morphologically stable for and was previously biopsied in 08/05/2019. Assuming a benign pathologic diagnosis, repeat sampling and/or dedicated follow-up is not recommended.  _________________________________________________________   Nodule # 3:  Location: RIGHT; Inferior  Maximum size: 1.5 cm; Other 2 dimensions: 1.4 x 1.3 cm, previously 1.5 x 1.4 x 0.9 cm  Composition: solid/almost completely solid (2)  Echogenicity: isoechoic (1) *Given size (>/= 1.5 - 2.4 cm) and appearance, a  follow-up ultrasound in 1 year should be considered based on TI-RADS criteria.  _________________________________________________________   Nodule # 4:  Location: LEFT; Mid  Maximum size: 2.6 cm; Other 2 dimensions: 2.2 x 2.2 cm, previously 2.5 x 2.0 x 1.5 cm  No aggressive features on today's evaluation. This nodule appears morphologically stable and was previously biopsied in 08/05/2019. Assuming a benign pathologic diagnosis, repeat sampling and/or dedicated follow-up is not recommended.  _________________________________________________________   Nodule # 5:  Location: LEFT; Inferior  Maximum size: 1.7 cm; Other 2 dimensions: 1.6 x 1.1 cm, previously 1.5 x 1.2 x 1.0 cm  Composition: solid/almost completely solid (2)  Echogenicity: isoechoic (1) *Given size (>/= 1.5 - 2.4 cm) and appearance, a follow-up ultrasound in 1 year should be considered based on TI-RADS criteria.  _________________________________________________________   No cervical adenopathy or abnormal fluid collection within the imaged neck.   IMPRESSION: 1. Multinodular thyroid gland. 2. 1.5 cm bilateral inferior TR-3 thyroid nodules. A follow-up ultrasound in 1 year should be considered based on TI-RADS criteria. 3. Additional smaller and previously-biopsied nodules, as described above, without follow-up nor biopsy indicated per current criteria.  Reviewed her TFTs: Lab Results  Component Value Date   TSH 1.05  12/01/2021   TSH 1.74 10/27/2020   TSH 1.35 05/25/2019   TSH 1.070 07/28/2015   TSH 1.13 10/15/2014   Pt denies: - feeling nodules in neck - hoarseness - dysphagia - choking  She also has a history of PCOS and distant history of epilepsy at 47 years old.  ROS: Signs see HPI  I reviewed pt's medications, allergies, PMH, social hx, family hx, and changes were documented in the history of present illness. Otherwise, unchanged from my initial visit note.  Past Medical History:  Diagnosis  Date   Allergic rhinitis    Breast discharge 06/05/2017   2 weeks ago left   Breast mass 12/06/2016   left   COVID-19 virus infection 11/2020   Diabetes mellitus without complication (HCC)    Dyslipidemia    Epilepsy (HCC)    Febrile seizures (HCC)    Galactorrhea    Hx gestational diabetes    Hypertension    Kidney stones    Morbid obesity (HCC)    Obesity    Seizures (HCC)    Sleep apnea    Syncope and collapse    Tachycardia    Past Surgical History:  Procedure Laterality Date   BREAST BIOPSY Left 2018   benign   CESAREAN SECTION     X 2   COLONOSCOPY WITH PROPOFOL N/A 09/26/2022   Procedure: COLONOSCOPY WITH PROPOFOL;  Surgeon: Toney Reil, MD;  Location: [facility] ENDOSCOPY;  Service: Gastroenterology;  Laterality: N/A;   IRRIGATION AND DEBRIDEMENT SHOULDER Left 10/12/2020   Procedure: IRRIGATION AND DEBRIDEMENT SHOULDER;  Surgeon: Bjorn Pippin, MD;  Location: [facility];  Service: Orthopedics;  Laterality: Left;   RIGHT OOPHORECTOMY Right 2001   benign tumor   TUBAL LIGATION  2007   Social History   Socioeconomic History   Marital status: Married    Spouse name: Christiane Ha   Number of children: 2   Years of education: [employer] education level: Master's degree (e.g., MA, MS, MEng, MEd, MSW, MBA)  Occupational History   Occupation: accountant  Tobacco Use   Smoking status: Never   Smokeless tobacco: Never  Vaping Use   Vaping status: Never Used  Substance and Sexual Activity   Alcohol use: Not Currently    Comment: occ   Drug use: No   Sexual activity: Yes    Partners: Male    Birth control/protection: Surgical    Comment: tubial lig  Other Topics Concern   Not on file  Social History Narrative   Not on file   Social Determinants of Health   Financial Resource Strain: Low Risk  (01/25/2023)   Overall Financial Resource Strain (CARDIA)    Difficulty of Paying Living Expenses: Not hard at all  Food Insecurity: No Food Insecurity (01/25/2023)    Hunger Vital Sign    Worried About Running Out of Food in the Last Year: Never true    Ran Out of Food in the Last Year: Never true  Transportation Needs: No Transportation Needs (01/25/2023)   PRAPARE - Administrator, Civil Service (Medical): No    Lack of Transportation (Non-Medical): No  Physical Activity: Sufficiently Active (01/25/2023)   Exercise Vital Sign    Days of Exercise per Week: 5 days    Minutes of Exercise per Session: 40 min  Stress: No Stress Concern Present (01/25/2023)   [employer] of Occupational Health - Occupational Stress Questionnaire    Feeling of Stress : Not at all  Social Connections:  Socially Integrated (01/25/2023)   Social Connection and Isolation Panel [NHANES]    Frequency of Communication with Friends and Family: More than three times a week    Frequency of Social Gatherings with Friends and Family: More than three times a week    Attends Religious Services: More than 4 times per year    Active Member of [employer] or Organizations: Yes    Attends Banker Meetings: More than 4 times per year    Marital Status: Married  Catering manager Violence: Unknown (02/06/2022)   Received from [employer], [facility]   HITS    Physically Hurt: Not on file    Insult or Talk Down To: Not on file    Threaten Physical Harm: Not on file    Scream or Curse: Not on file   Meds: Current Outpatient Medications on File Prior to Visit  Medication Sig Dispense Refill   benzonatate (TESSALON) 100 MG capsule Take 1 capsule (100 mg total) by mouth 3 (three) times daily as needed for cough. 30 capsule 0   Cholecalciferol (VITAMIN D) 50 MCG (2000 UT) CAPS Take 1 capsule by mouth daily at 12 noon.     clonazePAM (KLONOPIN) 0.5 MG tablet Take 0.5 mg by mouth daily as needed (Seizures).   3   EPINEPHrine 0.3 mg/0.3 mL IJ SOAJ injection Inject 0.3 mg into the muscle as needed for anaphylaxis (for anaphylaxis).      insulin glargine (LANTUS SOLOSTAR)  100 UNIT/ML Solostar Pen INJECT SUBCUTANEOUSLY 40 UNITS  DAILY 45 mL 3   Insulin Pen Needle 32G X 4 MM MISC Use 1x a day 100 each 3   metFORMIN (GLUCOPHAGE-XR) 500 MG 24 hr tablet TAKE 4 TABLETS (2,000 MG TOTAL) BY MOUTH DAILY WITH SUPPER. 120 tablet 5   MOUNJARO 15 MG/0.5ML Pen INJECT THE CONTENTS OF ONE PEN  SUBCUTANEOUSLY WEEKLY AS  DIRECTED 6 mL 3   omeprazole (PRILOSEC) 40 MG capsule Take 1 capsule (40 mg total) by mouth daily. Before dinner 30 capsule 0   OneTouch Delica Lancets 33G MISC Use 2x a day with OneTouch Verio Flex 200 each 3   ONETOUCH VERIO test strip USE 2X A DAY WITH ONETOUCH VERIO FLEX 50 strip 15   rosuvastatin (CRESTOR) 20 MG tablet Take 1 tablet (20 mg total) by mouth daily. 90 tablet 3   tranexamic acid (LYSTEDA) 650 MG TABS tablet Take 2 tablets (1,300 mg total) by mouth 3 (three) times daily. Take during menses for a maximum of five days 30 tablet 2   No current facility-administered medications on file prior to visit.   Allergies  Allergen Reactions   Peanuts [Peanut Oil] Anaphylaxis   Aspirin Other (See Comments)    Does not take because of her epilepsy/seizure    Family History  Problem Relation Age of Onset   Diabetes Mother    Healthy Father    Cancer Maternal Grandmother    Breast cancer Paternal Grandmother 61   Cancer Paternal Grandmother    Cancer Paternal Aunt    Heart disease Neg Hx    PE: BP 126/70   Pulse 100   Ht 5\' 7"  (1.702 m)   Wt 261 lb 3.2 oz (118.5 kg)   SpO2 99%   BMI 40.91 kg/m  Wt Readings from Last 3 Encounters:  07/04/23 261 lb 3.2 oz (118.5 kg)  05/22/23 261 lb 8 oz (118.6 kg)  01/25/23 262 lb 9.6 oz (119.1 kg)   Constitutional: overweight, in NAD Eyes: EOMI,  no exophthalmos ENT: + B thyromegaly, no cervical lymphadenopathy Cardiovascular: tachycardia, RR, No MRG Respiratory: CTA B Musculoskeletal: no deformities Skin: no rashes Neurological: no tremor with outstretched hands Diabetic Foot Exam - Simple   Simple  Foot Form Diabetic Foot exam was performed with the following findings: Yes 07/04/2023  8:36 AM  Visual Inspection No deformities, no ulcerations, no other skin breakdown bilaterally: Yes Sensation Testing Intact to touch and monofilament testing bilaterally: Yes Pulse Check Posterior Tibialis and Dorsalis pulse intact bilaterally: Yes Comments    ASSESSMENT: 1. DM2, insulin-dependent, uncontrolled, with complications - DR - MAU  2.  Thyroid nodules  3. HL  4. Obesity class 3  PLAN:  1. Patient with longstanding, previously uncontrolled type 2 diabetes, on long-acting insulin, and GLP-1/GIP receptor agonist, with significantly improved control after starting Mounjaro.  At last visit, sugars were at goal throughout the day but she sometimes needed to skip the insulin due to lower blood sugars overnight.  We reduced her Lantus dose at that time.  I advised her to reduce the dose further, rather than skipping it, if sugars remained on the lower side.  HbA1c was 5.8%, improved. -At today's visit, sugars remain at goal, only being higher when she had COVID-19 infection last month.  She actually was able to decrease the dose of Lantus further, to 30 units at bedtime.  I feel that this dose is working well for her.  Even though sugars are at goal and HbA1c is under 6%, for now, I did not suggest to further decrease the insulin dose with the holidays pending.  At next visit, however, we may need to decrease the insulin dose further. - I suggested to:  Patient Instructions  Please continue: - Metformin ER 2000 mg with dinner - Mounjaro 15 mg weekly - Lantus 40 units at bedtime  Please return in 4-6 months with your sugar log.   - we checked her HbA1c: 5.9% (slightly higher) - advised to check sugars at different times of the day - 1x a day, rotating check times - advised for yearly eye exams >> she is UTD - return to clinic in 4-6 months  2.  Thyroid nodules -She denies neck compression  symptoms -TSH was normal Lab Results  Component Value Date   TSH 1.05 12/01/2021  -2 of the nodules were biopsied in 07/2019 with benign results  -The thyroid ultrasound reports from 09/2021 in 09/2022 showed stable nodules.  The dominant 2 nodules did need follow-up in a year -Plan to check another ultrasound at next visit  3. HL -Latest lipid panel from 11/2021: LDL above target, HDL low: Lab Results  Component Value Date   CHOL 210 (H) 12/01/2021   HDL 34.80 (L) 12/01/2021   LDLCALC 148 (H) 12/01/2021   TRIG 133.0 12/01/2021   CHOLHDL 6 12/01/2021  -she is on Crestor 20 mg daily-no side effects -she is due for another lipid panel -will check today  4.  Obesity class III -Previously contemplating gastric sleeve surgery but after she lost almost 50 pounds after starting Mounjaro, plans are on hold -Before last visit, she lost 7 more pounds -Will continue Mounjaro and to reduce her insulin doses, which should both help weight loss -She lost 11 pounds since last visit  Component     Latest Ref Rng 07/04/2023  Hemoglobin A1C     4.0 - 5.6 % 5.9 !   Cholesterol     0 - 200 mg/dL 213 (H)   HDL Cholesterol     >  39.00 mg/dL 10.27   Triglycerides     0.0 - 149.0 mg/dL 253.6   Total CHOL/HDL Ratio 5   Microalb, Ur     0.0 - 1.9 mg/dL 2.4 (H)   MICROALB/CREAT RATIO     0.0 - 30.0 mg/g 1.5   VLDL     0.0 - 40.0 mg/dL 64.4   LDL (calc)     0 - 99 mg/dL 034 (H)   NonHDL 742.59   Creatinine,U     mg/dL 563.8   Normal ACR. LDL above our target of less than 70.  I will check with her if she is taking Crestor 20 mg consistently.   Carlus Pavlov, MD PhD [facility]

## 2023-07-19 ENCOUNTER — Ambulatory Visit
Admission: RE | Admit: 2023-07-19 | Discharge: 2023-07-19 | Disposition: A | Discharge status: Home / Self Care | Payer: 59 | Source: Ambulatory Visit | Attending: Family Medicine

## 2023-07-19 ENCOUNTER — Other Ambulatory Visit: Payer: Self-pay

## 2023-07-19 VITALS — BP 144/86 | HR 80 | Temp 98.4°F | Resp 18

## 2023-07-19 DIAGNOSIS — H6692 Otitis media, unspecified, left ear: Secondary | ICD-10-CM

## 2023-07-19 MED ORDER — AZITHROMYCIN 250 MG PO TABS: ORAL_TABLET | ORAL | 0 refills | Status: DC

## 2023-07-19 MED ORDER — FLUCONAZOLE 150 MG PO TABS: 150.0000 mg | ORAL_TABLET | ORAL | 0 refills | Status: DC | PRN

## 2023-07-19 NOTE — ED Provider Notes (Signed)
Erin Good    CSN: [id number] Arrival date & time: 07/19/23  1801      History   Chief Complaint Chief Complaint  Patient presents with   Ear Fullness    Ear ache left - Entered by patient   Ear Pain    HPI Erin Good is a 47 y.o. female.  Medical history significant for allergic rhinitis, anxiety and depression, diabetes, and hypertension. Presents with left ear pain x 1 day.  She denies any associated symptoms.  Here for evaluation.  Has not had any fever.   Past Medical History:  Diagnosis Date   Allergic rhinitis    Breast discharge 06/05/2017   2 weeks ago left   Breast mass 12/06/2016   left   COVID-19 virus infection 11/2020   Diabetes mellitus without complication (HCC)    Dyslipidemia    Epilepsy (HCC)    Febrile seizures (HCC)    Galactorrhea    Hx gestational diabetes    Hypertension    Kidney stones    Morbid obesity (HCC)    Obesity    Seizures (HCC)    Sleep apnea    Syncope and collapse    Tachycardia     Patient Active Problem List   Diagnosis Date Noted   Colon cancer screening 09/26/2022   Diabetes mellitus without complication (HCC)    Morbid obesity (HCC)    Hypertension    COVID-19 virus infection 11/2020   Impingement syndrome of left shoulder region 04/20/2020   Vitamin D deficiency 01/31/2017   Right shoulder tendinitis 01/31/2017   Tendinosis 01/31/2017   Bell's palsy 12/19/2016   Tachycardia 09/30/2016   Depression with anxiety 05/06/2015   Allergic rhinitis 04/20/2015   Anxiety and depression 04/20/2015   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   Gastro-esophageal reflux disease without esophagitis 04/20/2015   Dysmetabolic syndrome 04/20/2015   NASH (nonalcoholic steatohepatitis) 04/20/2015   Allergy to nuts 04/20/2015   Calculus of kidney 04/20/2015   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   Central sleep apnea 11/26/2008   Dyslipidemia 07/02/2008   Leukocytosis  07/29/2007    Past Surgical History:  Procedure Laterality Date   BREAST BIOPSY Left 2018   benign   CESAREAN SECTION     X 2   COLONOSCOPY WITH PROPOFOL N/A 09/26/2022   Procedure: COLONOSCOPY WITH PROPOFOL;  Surgeon: Toney Reil, MD;  Location: [facility] ENDOSCOPY;  Service: Gastroenterology;  Laterality: N/A;   IRRIGATION AND DEBRIDEMENT SHOULDER Left 10/12/2020   Procedure: IRRIGATION AND DEBRIDEMENT SHOULDER;  Surgeon: Bjorn Pippin, MD;  Location: [facility] ORS;  Service: Orthopedics;  Laterality: Left;   RIGHT OOPHORECTOMY Right 2001   benign tumor   TUBAL LIGATION  2007    OB History     Gravida  2   Para  2   Term      Preterm  2   AB      Living  2      SAB      IAB      Ectopic      Multiple      Live Births  2        Obstetric Comments  Menstrual age: 38  Age 1st Pregnancy: 39            Home Medications    Prior to Admission medications   Medication Sig Start Date End Date Taking? Authorizing Provider  clonazePAM (KLONOPIN) 0.5 MG tablet Take 0.5  mg by mouth daily as needed (Seizures).  08/14/16  Yes [provider]  benzonatate (TESSALON) 100 MG capsule Take 1 capsule (100 mg total) by mouth 3 (three) times daily as needed for cough. 05/23/23   Waldon Merl, PA-C  Cholecalciferol (VITAMIN D) 50 MCG (2000 UT) CAPS Take 1 capsule by mouth daily at 12 noon.    [provider]  EPINEPHrine 0.3 mg/0.3 mL IJ SOAJ injection Inject 0.3 mg into the muscle as needed for anaphylaxis (for anaphylaxis).  06/14/14   [provider]  insulin glargine (LANTUS SOLOSTAR) 100 UNIT/ML Solostar Pen INJECT SUBCUTANEOUSLY 30-35 UNITS  DAILY 07/04/23   Carlus Pavlov, MD  Insulin Pen Needle 32G X 4 MM MISC Use 1x a day 07/03/19   Carlus Pavlov, MD  metFORMIN (GLUCOPHAGE-XR) 500 MG 24 hr tablet Take 4 tablets (2,000 mg total) by mouth daily with supper. 07/04/23   Carlus Pavlov, MD  MOUNJARO 15 MG/0.5ML Pen INJECT THE CONTENTS  OF ONE PEN  SUBCUTANEOUSLY WEEKLY AS  DIRECTED 05/14/23   Carlus Pavlov, MD  omeprazole (PRILOSEC) 40 MG capsule Take 1 capsule (40 mg total) by mouth daily. Before dinner 12/05/22   Alba Cory, MD  OneTouch Delica Lancets 33G MISC Use 2x a day with OneTouch Verio Flex 07/03/19   Carlus Pavlov, MD  [facility] VERIO test strip USE 2X A DAY WITH ONETOUCH VERIO FLEX 10/02/21   Carlus Pavlov, MD  rosuvastatin (CRESTOR) 20 MG tablet Take 1 tablet (20 mg total) by mouth daily. 10/31/20   Carlus Pavlov, MD  tranexamic acid (LYSTEDA) 650 MG TABS tablet Take 2 tablets (1,300 mg total) by mouth 3 (three) times daily. Take during menses for a maximum of five days 12/29/21   Hildred Laser, MD    Family History Family History  Problem Relation Age of Onset   Diabetes Mother    Healthy Father    Cancer Maternal Grandmother    Breast cancer Paternal Grandmother 55   Cancer Paternal Grandmother    Cancer Paternal Aunt    Heart disease Neg Hx     Social History Social History   Tobacco Use   Smoking status: Never   Smokeless tobacco: Never  Vaping Use   Vaping status: Never Used  Substance Use Topics   Alcohol use: Not Currently    Comment: occ   Drug use: No     Allergies   Peanuts [peanut oil] and Aspirin   Review of Systems Review of Systems   Physical Exam Triage Vital Signs ED Triage Vitals  Encounter Vitals Group     BP 07/19/23 1824 (!) 144/86     Systolic BP Percentile --      Diastolic BP Percentile --      Pulse Rate 07/19/23 1824 80     Resp 07/19/23 1824 18     Temp 07/19/23 1824 98.4 F (36.9 C)     Temp Source 07/19/23 1824 Oral     SpO2 07/19/23 1824 100 %     Weight --      Height --      Head Circumference --      Peak Flow --      Pain Score 07/19/23 1823 10     Pain Loc --      Pain Education --      Exclude from Growth Chart --    No data found.  Updated Vital Signs BP (!) 144/86 (BP Location: Left Arm)   Pulse 80  Temp 98.4 F  (36.9 C) (Oral)   Resp 18   LMP 07/09/2023   SpO2 100%   Visual Acuity Right Eye Distance:   Left Eye Distance:   Bilateral Distance:    Right Eye Near:   Left Eye Near:    Bilateral Near:     Physical Exam   UC Treatments / Results  Labs (all labs ordered are listed, but only abnormal results are displayed) Labs Reviewed - No data to display  EKG   Radiology No results found.  Procedures Procedures (including critical care time)  Medications Ordered in UC Medications - No data to display  Initial Impression / Assessment and Plan / UC Course  I have reviewed the triage vital signs and the nursing notes.  Pertinent labs & imaging results that were available during my care of the patient were reviewed by me and considered in my medical decision making (see chart for details).     *** Final Clinical Impressions(s) / UC Diagnoses   Final diagnoses:  None   Discharge Instructions   None    ED Prescriptions   None    PDMP not reviewed this encounter.

## 2023-07-19 NOTE — ED Triage Notes (Signed)
Patient presents to [facility] for evaluation of left ear pain since last night.  Denies any other associated symptoms

## 2023-07-19 NOTE — Discharge Instructions (Signed)
Can you Tylenol or ibuprofen as needed for pain however your symptoms should improve within the next 2 to 3 days.

## 2023-08-25 ENCOUNTER — Other Ambulatory Visit: Payer: Self-pay

## 2023-08-25 ENCOUNTER — Ambulatory Visit
Admission: RE | Admit: 2023-08-25 | Discharge: 2023-08-25 | Disposition: A | Discharge status: Home / Self Care | Payer: 59 | Source: Ambulatory Visit | Attending: Internal Medicine | Admitting: Internal Medicine

## 2023-08-25 VITALS — BP 138/79 | HR 90 | Temp 97.7°F | Resp 16

## 2023-08-25 DIAGNOSIS — N3000 Acute cystitis without hematuria: Secondary | ICD-10-CM

## 2023-08-25 DIAGNOSIS — R300 Dysuria: Secondary | ICD-10-CM | POA: Diagnosis not present

## 2023-08-25 LAB — POCT URINALYSIS DIP (MANUAL ENTRY)
Bilirubin, UA: NEGATIVE
Blood, UA: NEGATIVE
Glucose, UA: NEGATIVE mg/dL
Ketones, POC UA: NEGATIVE mg/dL
Nitrite, UA: NEGATIVE
Protein Ur, POC: NEGATIVE mg/dL
Spec Grav, UA: 1.025 (ref 1.010–1.025)
Urobilinogen, UA: 1 U/dL
pH, UA: 6 (ref 5.0–8.0)

## 2023-08-25 MED ORDER — CEFDINIR 300 MG PO CAPS: 300.0000 mg | ORAL_CAPSULE | Freq: Two times a day (BID) | ORAL | 0 refills | Status: AC

## 2023-08-25 NOTE — Discharge Instructions (Addendum)
Advised patient to take medication as directed with food to completion.  Encouraged to increase daily water intake to 64 ounces per day while taking this medication.  Advised we will follow-up with urine culture results once received.  Advised if symptoms worsen and/or unresolved please follow-up PCP or here for further evaluation.

## 2023-08-25 NOTE — ED Triage Notes (Addendum)
Pt c/o LLQ abd cramping, burning with urination and vaginal swelling that began Thursday.   Home interventions: none

## 2023-08-25 NOTE — ED Provider Notes (Signed)
[facility]    CSN: [id number] Arrival date & time: 08/25/23  1053      History   Chief Complaint Chief Complaint  Patient presents with   Urinary Frequency    Possible UTI - Entered by patient   Abdominal Pain    HPI Erin Good is a 47 y.o. female.   HPI Pleasant 47 year old female presents with left lower quadrant abdominal cramping, burning with urination and vaginal swelling that began on Thursday or 2 days ago.  PMH significant for morbid obesity, breast mass, and HTN.  Past Medical History:  Diagnosis Date   Allergic rhinitis    Breast discharge 06/05/2017   2 weeks ago left   Breast mass 12/06/2016   left   COVID-19 virus infection 11/2020   Diabetes mellitus without complication (HCC)    Dyslipidemia    Epilepsy (HCC)    Febrile seizures (HCC)    Galactorrhea    Hx gestational diabetes    Hypertension    Kidney stones    Morbid obesity (HCC)    Obesity    Seizures (HCC)    Sleep apnea    Syncope and collapse    Tachycardia     Patient Active Problem List   Diagnosis Date Noted   Colon cancer screening 09/26/2022   Diabetes mellitus without complication (HCC)    Morbid obesity (HCC)    Hypertension    COVID-19 virus infection 11/2020   Impingement syndrome of left shoulder region 04/20/2020   Vitamin D deficiency 01/31/2017   Right shoulder tendinitis 01/31/2017   Tendinosis 01/31/2017   Bell's palsy 12/19/2016   Tachycardia 09/30/2016   Depression with anxiety 05/06/2015   Allergic rhinitis 04/20/2015   Anxiety and depression 04/20/2015   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   Gastro-esophageal reflux disease without esophagitis 04/20/2015   Dysmetabolic syndrome 04/20/2015   NASH (nonalcoholic steatohepatitis) 04/20/2015   Allergy to nuts 04/20/2015   Calculus of kidney 04/20/2015   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   Central sleep apnea 11/26/2008   Dyslipidemia 07/02/2008    Leukocytosis 07/29/2007    Past Surgical History:  Procedure Laterality Date   BREAST BIOPSY Left 2018   benign   CESAREAN SECTION     X 2   COLONOSCOPY WITH PROPOFOL N/A 09/26/2022   Procedure: COLONOSCOPY WITH PROPOFOL;  Surgeon: Toney Reil, MD;  Location: [facility] ENDOSCOPY;  Service: Gastroenterology;  Laterality: N/A;   IRRIGATION AND DEBRIDEMENT SHOULDER Left 10/12/2020   Procedure: IRRIGATION AND DEBRIDEMENT SHOULDER;  Surgeon: Bjorn Pippin, MD;  Location: [facility] ORS;  Service: Orthopedics;  Laterality: Left;   RIGHT OOPHORECTOMY Right 2001   benign tumor   TUBAL LIGATION  2007    OB History     Gravida  2   Para  2   Term      Preterm  2   AB      Living  2      SAB      IAB      Ectopic      Multiple      Live Births  2        Obstetric Comments  Menstrual age: 32  Age 1st Pregnancy: 38            Home Medications    Prior to Admission medications   Medication Sig Start Date End Date Taking? Authorizing Provider  cefdinir (OMNICEF) 300 MG capsule Take 1 capsule (300  mg total) by mouth 2 (two) times daily for 7 days. 08/25/23 09/01/23 Yes Trevor Iha, FNP  Cholecalciferol (VITAMIN D) 50 MCG (2000 UT) CAPS Take 1 capsule by mouth daily at 12 noon.    [provider]  clonazePAM (KLONOPIN) 0.5 MG tablet Take 0.5 mg by mouth daily as needed (Seizures).  08/14/16   [provider]  EPINEPHrine 0.3 mg/0.3 mL IJ SOAJ injection Inject 0.3 mg into the muscle as needed for anaphylaxis (for anaphylaxis).  06/14/14   [provider]  insulin glargine (LANTUS SOLOSTAR) 100 UNIT/ML Solostar Pen INJECT SUBCUTANEOUSLY 30-35 UNITS  DAILY 07/04/23   Carlus Pavlov, MD  Insulin Pen Needle 32G X 4 MM MISC Use 1x a day 07/03/19   Carlus Pavlov, MD  metFORMIN (GLUCOPHAGE-XR) 500 MG 24 hr tablet Take 4 tablets (2,000 mg total) by mouth daily with supper. 07/04/23   Carlus Pavlov, MD  MOUNJARO 15 MG/0.5ML Pen INJECT THE  CONTENTS OF ONE PEN  SUBCUTANEOUSLY WEEKLY AS  DIRECTED 05/14/23   Carlus Pavlov, MD  omeprazole (PRILOSEC) 40 MG capsule Take 1 capsule (40 mg total) by mouth daily. Before dinner 12/05/22   Alba Cory, MD  OneTouch Delica Lancets 33G MISC Use 2x a day with OneTouch Verio Flex 07/03/19   Carlus Pavlov, MD  [facility] VERIO test strip USE 2X A DAY WITH ONETOUCH VERIO FLEX 10/02/21   Carlus Pavlov, MD  rosuvastatin (CRESTOR) 20 MG tablet Take 1 tablet (20 mg total) by mouth daily. 10/31/20   Carlus Pavlov, MD  tranexamic acid (LYSTEDA) 650 MG TABS tablet Take 2 tablets (1,300 mg total) by mouth 3 (three) times daily. Take during menses for a maximum of five days 12/29/21   Hildred Laser, MD    Family History Family History  Problem Relation Age of Onset   Diabetes Mother    Healthy Father    Cancer Maternal Grandmother    Breast cancer Paternal Grandmother 10   Cancer Paternal Grandmother    Cancer Paternal Aunt    Heart disease Neg Hx     Social History Social History   Tobacco Use   Smoking status: Never   Smokeless tobacco: Never  Vaping Use   Vaping status: Never Used  Substance Use Topics   Alcohol use: Not Currently    Comment: occ   Drug use: No     Allergies   Peanuts [peanut oil] and Aspirin   Review of Systems Review of Systems  Genitourinary:  Positive for dysuria, frequency and urgency.  All other systems reviewed and are negative.    Physical Exam Triage Vital Signs ED Triage Vitals  Encounter Vitals Group     BP 08/25/23 1116 138/79     Systolic BP Percentile --      Diastolic BP Percentile --      Pulse Rate 08/25/23 1116 90     Resp 08/25/23 1116 16     Temp 08/25/23 1116 97.7 F (36.5 C)     Temp Source 08/25/23 1116 Oral     SpO2 08/25/23 1116 96 %     Weight --      Height --      Head Circumference --      Peak Flow --      Pain Score 08/25/23 1114 8     Pain Loc --      Pain Education --      Exclude from Growth Chart  --    No data found.  Updated Vital  Signs BP 138/79 (BP Location: Left Arm)   Pulse 90   Temp 97.7 F (36.5 C) (Oral)   Resp 16   LMP 06/12/2023 (Approximate)   SpO2 96%    Physical Exam Vitals and nursing note reviewed.  Constitutional:      Appearance: Normal appearance. She is normal weight.  HENT:     Head: Normocephalic and atraumatic.     Mouth/Throat:     Mouth: Mucous membranes are moist.     Pharynx: Oropharynx is clear.  Eyes:     Extraocular Movements: Extraocular movements intact.     Conjunctiva/sclera: Conjunctivae normal.     Pupils: Pupils are equal, round, and reactive to light.  Cardiovascular:     Rate and Rhythm: Normal rate and regular rhythm.     Pulses: Normal pulses.     Heart sounds: Normal heart sounds.  Pulmonary:     Effort: Pulmonary effort is normal.     Breath sounds: Normal breath sounds. No wheezing, rhonchi or rales.  Abdominal:     Tenderness: There is no right CVA tenderness or left CVA tenderness.  Musculoskeletal:        General: Normal range of motion.     Cervical back: Normal range of motion and neck supple.  Skin:    General: Skin is warm and dry.  Neurological:     General: No focal deficit present.     Mental Status: She is alert and oriented to person, place, and time. Mental status is at baseline.  Psychiatric:        Mood and Affect: Mood normal.        Behavior: Behavior normal.      UC Treatments / Results  Labs (all labs ordered are listed, but only abnormal results are displayed) Labs Reviewed  POCT URINALYSIS DIP (MANUAL ENTRY) - Abnormal; Notable for the following components:      Result Value   Clarity, UA cloudy (*)    Leukocytes, UA Moderate (2+) (*)    All other components within normal limits  URINE CULTURE    EKG   Radiology No results found.  Procedures Procedures (including critical care time)  Medications Ordered in UC Medications - No data to display  Initial Impression /  Assessment and Plan / UC Course  I have reviewed the triage vital signs and the nursing notes.  Pertinent labs & imaging results that were available during my care of the patient were reviewed by me and considered in my medical decision making (see chart for details).     MDM: 1.  Acute cystitis without hematuria-Rx'd Cefdinir 300 mg capsule: Take 1 capsule twice daily x 7 days, UA reveals above, urine culture ordered; 2.  Dysuria-UA revealed above, urine culture ordered, Rx'd Cefdinir 300 mg capsule: Take 1 capsule twice daily x 7 days. Advised patient to take medication as directed with food to completion.  Encouraged to increase daily water intake to 64 ounces per day while taking this medication.  Advised we will follow-up with urine culture results once received.  Advised if symptoms worsen and/or unresolved please follow-up PCP or here for further evaluation.  Patient discharged home, hemodynamically stable. Final Clinical Impressions(s) / UC Diagnoses   Final diagnoses:  Acute cystitis without hematuria  Dysuria     Discharge Instructions      Advised patient to take medication as directed with food to completion.  Encouraged to increase daily water intake to 64 ounces per day while taking this medication.  Advised we will follow-up with urine culture results once received.  Advised if symptoms worsen and/or unresolved please follow-up PCP or here for further evaluation.     ED Prescriptions     Medication Sig Dispense Auth. Provider   cefdinir (OMNICEF) 300 MG capsule Take 1 capsule (300 mg total) by mouth 2 (two) times daily for 7 days. 14 capsule Trevor Iha, FNP      PDMP not reviewed this encounter.   Trevor Iha, FNP 08/25/23 1208

## 2023-08-28 LAB — URINE CULTURE: Culture: 40000 — AB

## 2023-11-02 ENCOUNTER — Ambulatory Visit
Admission: RE | Admit: 2023-11-02 | Discharge: 2023-11-02 | Disposition: A | Discharge status: Home / Self Care | Payer: 59 | Source: Ambulatory Visit | Attending: Family Medicine | Admitting: Family Medicine

## 2023-11-02 ENCOUNTER — Other Ambulatory Visit: Payer: Self-pay

## 2023-11-02 VITALS — BP 157/85 | HR 118 | Temp 100.0°F | Resp 18

## 2023-11-02 DIAGNOSIS — J101 Influenza due to other identified influenza virus with other respiratory manifestations: Secondary | ICD-10-CM | POA: Diagnosis not present

## 2023-11-02 DIAGNOSIS — R059 Cough, unspecified: Secondary | ICD-10-CM | POA: Diagnosis not present

## 2023-11-02 LAB — POC INFLUENZA A AND B ANTIGEN (URGENT CARE ONLY)
Influenza A Ag: POSITIVE — AB
Influenza B Ag: NEGATIVE

## 2023-11-02 MED ORDER — HYDROCODONE BIT-HOMATROP MBR 5-1.5 MG/5ML PO SOLN: 5.0000 mL | Freq: Four times a day (QID) | ORAL | 0 refills | Status: DC | PRN

## 2023-11-02 MED ORDER — PREDNISONE 20 MG PO TABS: ORAL_TABLET | ORAL | 0 refills | Status: DC

## 2023-11-02 MED ORDER — BENZONATATE 200 MG PO CAPS
200.0000 mg | ORAL_CAPSULE | Freq: Three times a day (TID) | ORAL | 0 refills | Status: AC | PRN
Start: 2023-11-02 — End: 2023-11-09

## 2023-11-02 MED ORDER — OSELTAMIVIR PHOSPHATE 75 MG PO CAPS: 75.0000 mg | ORAL_CAPSULE | Freq: Two times a day (BID) | ORAL | 0 refills | Status: DC

## 2023-11-02 NOTE — ED Provider Notes (Signed)
Erin Good CARE    CSN: [id number] Arrival date & time: 11/02/23  1237      History   Chief Complaint Chief Complaint  Patient presents with   Cough    Appt 1245P   Fever   Nasal Congestion    HPI Erin Good is a 47 y.o. female.   HPI 47 year old female presents with cough, fever, nasal congestion.  PMH significant for morbid obesity, Bell's palsy, and tachycardia  Past Medical History:  Diagnosis Date   Allergic rhinitis    Breast discharge 06/05/2017   2 weeks ago left   Breast mass 12/06/2016   left   COVID-19 virus infection 11/2020   Diabetes mellitus without complication (HCC)    Dyslipidemia    Epilepsy (HCC)    Febrile seizures (HCC)    Galactorrhea    Hx gestational diabetes    Hypertension    Kidney stones    Morbid obesity (HCC)    Obesity    Seizures (HCC)    Sleep apnea    Syncope and collapse    Tachycardia     Patient Active Problem List   Diagnosis Date Noted   Colon cancer screening 09/26/2022   Diabetes mellitus without complication (HCC)    Morbid obesity (HCC)    Hypertension    COVID-19 virus infection 11/2020   Impingement syndrome of left shoulder region 04/20/2020   Vitamin D deficiency 01/31/2017   Right shoulder tendinitis 01/31/2017   Tendinosis 01/31/2017   Bell's palsy 12/19/2016   Tachycardia 09/30/2016   Depression with anxiety 05/06/2015   Allergic rhinitis 04/20/2015   Anxiety and depression 04/20/2015   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   Gastro-esophageal reflux disease without esophagitis 04/20/2015   Dysmetabolic syndrome 04/20/2015   NASH (nonalcoholic steatohepatitis) 04/20/2015   Allergy to nuts 04/20/2015   Calculus of kidney 04/20/2015   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   Central sleep apnea 11/26/2008   Dyslipidemia 07/02/2008   Leukocytosis 07/29/2007    Past Surgical History:  Procedure Laterality Date   BREAST BIOPSY Left 2018    benign   CESAREAN SECTION     X 2   COLONOSCOPY WITH PROPOFOL N/A 09/26/2022   Procedure: COLONOSCOPY WITH PROPOFOL;  Surgeon: Toney Reil, MD;  Location: [facility] ENDOSCOPY;  Service: Gastroenterology;  Laterality: N/A;   IRRIGATION AND DEBRIDEMENT SHOULDER Left 10/12/2020   Procedure: IRRIGATION AND DEBRIDEMENT SHOULDER;  Surgeon: Bjorn Pippin, MD;  Location: [facility] ORS;  Service: Orthopedics;  Laterality: Left;   RIGHT OOPHORECTOMY Right 2001   benign tumor   TUBAL LIGATION  2007    OB History     Gravida  2   Para  2   Term      Preterm  2   AB      Living  2      SAB      IAB      Ectopic      Multiple      Live Births  2        Obstetric Comments  Menstrual age: 89  Age 1st Pregnancy: 39            Home Medications    Prior to Admission medications   Medication Sig Start Date End Date Taking? Authorizing Provider  benzonatate (TESSALON) 200 MG capsule Take 1 capsule (200 mg total) by mouth 3 (three) times daily as needed for up to 7 days. 11/02/23 11/09/23 Yes  Trevor Iha, FNP  HYDROcodone bit-homatropine (HYCODAN) 5-1.5 MG/5ML syrup Take 5 mLs by mouth every 6 (six) hours as needed for cough. 11/02/23  Yes Trevor Iha, FNP  oseltamivir (TAMIFLU) 75 MG capsule Take 1 capsule (75 mg total) by mouth every 12 (twelve) hours. 11/02/23  Yes Trevor Iha, FNP  predniSONE (DELTASONE) 20 MG tablet Take 3 tabs PO daily x 5 days. 11/02/23  Yes Trevor Iha, FNP  Cholecalciferol (VITAMIN D) 50 MCG (2000 UT) CAPS Take 1 capsule by mouth daily at 12 noon.    [provider]  clonazePAM (KLONOPIN) 0.5 MG tablet Take 0.5 mg by mouth daily as needed (Seizures).  08/14/16   [provider]  EPINEPHrine 0.3 mg/0.3 mL IJ SOAJ injection Inject 0.3 mg into the muscle as needed for anaphylaxis (for anaphylaxis).  06/14/14   [provider]  insulin glargine (LANTUS SOLOSTAR) 100 UNIT/ML Solostar Pen INJECT SUBCUTANEOUSLY 30-35 UNITS   DAILY 07/04/23   Carlus Pavlov, MD  Insulin Pen Needle 32G X 4 MM MISC Use 1x a day 07/03/19   Carlus Pavlov, MD  metFORMIN (GLUCOPHAGE-XR) 500 MG 24 hr tablet Take 4 tablets (2,000 mg total) by mouth daily with supper. 07/04/23   Carlus Pavlov, MD  MOUNJARO 15 MG/0.5ML Pen INJECT THE CONTENTS OF ONE PEN  SUBCUTANEOUSLY WEEKLY AS  DIRECTED 05/14/23   Carlus Pavlov, MD  omeprazole (PRILOSEC) 40 MG capsule Take 1 capsule (40 mg total) by mouth daily. Before dinner 12/05/22   Alba Cory, MD  OneTouch Delica Lancets 33G MISC Use 2x a day with OneTouch Verio Flex 07/03/19   Carlus Pavlov, MD  [facility] VERIO test strip USE 2X A DAY WITH ONETOUCH VERIO FLEX 10/02/21   Carlus Pavlov, MD  rosuvastatin (CRESTOR) 20 MG tablet Take 1 tablet (20 mg total) by mouth daily. 10/31/20   Carlus Pavlov, MD  tranexamic acid (LYSTEDA) 650 MG TABS tablet Take 2 tablets (1,300 mg total) by mouth 3 (three) times daily. Take during menses for a maximum of five days 12/29/21   Hildred Laser, MD    Family History Family History  Problem Relation Age of Onset   Diabetes Mother    Healthy Father    Cancer Maternal Grandmother    Breast cancer Paternal Grandmother 73   Cancer Paternal Grandmother    Cancer Paternal Aunt    Heart disease Neg Hx     Social History Social History   Tobacco Use   Smoking status: Never   Smokeless tobacco: Never  Vaping Use   Vaping status: Never Used  Substance Use Topics   Alcohol use: Not Currently    Comment: occ   Drug use: No     Allergies   Peanuts [peanut oil] and Aspirin   Review of Systems Review of Systems  Constitutional:  Positive for fatigue.  HENT:  Positive for congestion.   Respiratory:  Positive for cough.      Physical Exam Triage Vital Signs ED Triage Vitals [11/02/23 1330]  Encounter Vitals Group     BP (!) 157/8     Systolic BP Percentile      Diastolic BP Percentile      Pulse Rate (!) 118     Resp 18     Temp  100 F (37.8 C)     Temp Source Oral     SpO2 99 %     Weight      Height      Head Circumference  Peak Flow      Pain Score 0     Pain Loc      Pain Education      Exclude from Growth Chart    No data found.  Updated Vital Signs BP (!) 157/85 (BP Location: Left Arm)   Pulse (!) 118   Temp 100 F (37.8 C) (Oral)   Resp 18   LMP 11/01/2023   SpO2 99%   Physical Exam Vitals and nursing note reviewed.  Constitutional:      Appearance: She is obese. She is ill-appearing.  HENT:     Head: Normocephalic and atraumatic.     Right Ear: Ear canal and external ear normal.     Left Ear: Tympanic membrane, ear canal and external ear normal.     Mouth/Throat:     Mouth: Mucous membranes are moist.     Pharynx: Oropharynx is clear.  Eyes:     Extraocular Movements: Extraocular movements intact.     Conjunctiva/sclera: Conjunctivae normal.     Pupils: Pupils are equal, round, and reactive to light.  Cardiovascular:     Rate and Rhythm: Normal rate and regular rhythm.     Pulses: Normal pulses.     Heart sounds: Normal heart sounds.  Pulmonary:     Effort: Pulmonary effort is normal.     Breath sounds: Normal breath sounds. No wheezing, rhonchi or rales.     Comments: Frequent nonproductive cough on exam Musculoskeletal:        General: Normal range of motion.     Cervical back: Normal range of motion and neck supple.  Skin:    General: Skin is warm and dry.  Neurological:     General: No focal deficit present.     Mental Status: She is alert and oriented to person, place, and time. Mental status is at baseline.      UC Treatments / Results  Labs (all labs ordered are listed, but only abnormal results are displayed) Labs Reviewed  POC INFLUENZA A AND B ANTIGEN (URGENT CARE ONLY) - Abnormal; Notable for the following components:      Result Value   Influenza A Ag Positive (*)    All other components within normal limits  POC SARS CORONAVIRUS 2 AG -  ED     EKG   Radiology No results found.  Procedures Procedures (including critical care time)  Medications Ordered in UC Medications - No data to display  Initial Impression / Assessment and Plan / UC Course  I have reviewed the triage vital signs and the nursing notes.  Pertinent labs & imaging results that were available during my care of the patient were reviewed by me and considered in my medical decision making (see chart for details).     MDM: 1.  Influenza A-Rx'd Tamiflu 75 mg capsule: Take 1 capsule twice daily x 5 days; 2.  Cough, unspecified type-Rx'd prednisone 20 mg tablet: Take 3 tabs p.o. daily x 5 days, Rx'd Tessalon 200 mg capsules: Take 1 capsule 3 times daily, as needed for cough, Rx'd Hycodan 5-1.5 mg / 5 mL syrup: Take 5 mL every 6 hours for cough. Advised patient to take medications as directed with food to completion.  Advised patient to take prednisone with first dose of Tamiflu for the next 5 days.  Advised may use Tessalon capsules daily or as needed for cough.  Advised may use Hycodan cough syrup at night prior to sleep for cough due to sedative effects.  Encouraged to increase daily water intake to 64 ounces per day.  Advised if symptoms worsen and/or unresolved please follow-up with PCP or here for further evaluation.  Work note provided to patient prior to discharge.  Patient discharged home, hemodynamically stable. Final Clinical Impressions(s) / UC Diagnoses   Final diagnoses:  Influenza A  Cough, unspecified type     Discharge Instructions      Advised patient to take medications as directed with food to completion.  Advised patient to take prednisone with first dose of Tamiflu for the next 5 days.  Advised may use Tessalon capsules daily or as needed for cough.  Advised may use Hycodan cough syrup at night prior to sleep for cough due to sedative effects.  Encouraged to increase daily water intake to 64 ounces per day.  Advised if symptoms worsen and/or  unresolved please follow-up with PCP or here for further evaluation.     ED Prescriptions     Medication Sig Dispense Auth. Provider   oseltamivir (TAMIFLU) 75 MG capsule Take 1 capsule (75 mg total) by mouth every 12 (twelve) hours. 10 capsule Trevor Iha, FNP   predniSONE (DELTASONE) 20 MG tablet Take 3 tabs PO daily x 5 days. 15 tablet Trevor Iha, FNP   benzonatate (TESSALON) 200 MG capsule Take 1 capsule (200 mg total) by mouth 3 (three) times daily as needed for up to 7 days. 40 capsule Trevor Iha, FNP   HYDROcodone bit-homatropine (HYCODAN) 5-1.5 MG/5ML syrup Take 5 mLs by mouth every 6 (six) hours as needed for cough. 120 mL Trevor Iha, FNP      I have reviewed the PDMP during this encounter.   Trevor Iha, FNP 11/02/23 1409

## 2023-11-02 NOTE — Discharge Instructions (Addendum)
Advised patient to take medications as directed with food to completion.  Advised patient to take prednisone with first dose of Tamiflu for the next 5 days.  Advised may use Tessalon capsules daily or as needed for cough.  Advised may use Hycodan cough syrup at night prior to sleep for cough due to sedative effects.  Encouraged to increase daily water intake to 64 ounces per day.  Advised if symptoms worsen and/or unresolved please follow-up with PCP or here for further evaluation.

## 2023-11-02 NOTE — ED Triage Notes (Addendum)
Pt c/o cough, runny nose and fever since yesterday. Daughter dx with pneumonia a couple days ago. Mucinex and advil prn. Would like covid and flu testing as well.

## 2023-11-06 ENCOUNTER — Other Ambulatory Visit: Payer: Self-pay

## 2023-11-06 ENCOUNTER — Observation Stay
Admission: EM | Admit: 2023-11-06 | Discharge: 2023-11-07 | Disposition: A | Discharge status: Home / Self Care | Payer: 59 | Attending: Internal Medicine | Admitting: Internal Medicine

## 2023-11-06 DIAGNOSIS — E1110 Type 2 diabetes mellitus with ketoacidosis without coma: Secondary | ICD-10-CM | POA: Diagnosis not present

## 2023-11-06 DIAGNOSIS — E669 Obesity, unspecified: Secondary | ICD-10-CM | POA: Diagnosis not present

## 2023-11-06 DIAGNOSIS — J111 Influenza due to unidentified influenza virus with other respiratory manifestations: Secondary | ICD-10-CM | POA: Diagnosis not present

## 2023-11-06 DIAGNOSIS — Z6841 Body Mass Index (BMI) 40.0 and over, adult: Secondary | ICD-10-CM | POA: Diagnosis not present

## 2023-11-06 DIAGNOSIS — E872 Acidosis, unspecified: Secondary | ICD-10-CM | POA: Diagnosis not present

## 2023-11-06 DIAGNOSIS — E66813 Obesity, class 3: Secondary | ICD-10-CM | POA: Diagnosis present

## 2023-11-06 DIAGNOSIS — G40909 Epilepsy, unspecified, not intractable, without status epilepticus: Secondary | ICD-10-CM | POA: Diagnosis present

## 2023-11-06 DIAGNOSIS — R569 Unspecified convulsions: Secondary | ICD-10-CM | POA: Diagnosis not present

## 2023-11-06 DIAGNOSIS — E876 Hypokalemia: Secondary | ICD-10-CM | POA: Insufficient documentation

## 2023-11-06 DIAGNOSIS — E119 Type 2 diabetes mellitus without complications: Secondary | ICD-10-CM

## 2023-11-06 HISTORY — DX: Unspecified convulsions: R56.9

## 2023-11-06 HISTORY — DX: Type 2 diabetes mellitus without complications: E11.9

## 2023-11-06 HISTORY — DX: Morbid (severe) obesity due to excess calories: E66.01

## 2023-11-06 MED ORDER — ALBUTEROL SULFATE (2.5 MG/3ML) 0.083% IN NEBU: 2.5000 mg | INHALATION_SOLUTION | RESPIRATORY_TRACT | Status: DC | PRN

## 2023-11-06 MED ORDER — ACETAMINOPHEN 325 MG PO TABS: 650.0000 mg | ORAL_TABLET | Freq: Four times a day (QID) | ORAL | Status: DC | PRN

## 2023-11-06 MED ORDER — INSULIN ASPART 100 UNIT/ML IJ SOLN: 0.0000 [IU] | Freq: Every day | INTRAMUSCULAR | Status: DC

## 2023-11-06 MED ORDER — ONDANSETRON HCL 4 MG/2ML IJ SOLN: 4.0000 mg | Freq: Three times a day (TID) | INTRAMUSCULAR | Status: DC | PRN

## 2023-11-06 MED ORDER — LORAZEPAM 2 MG/ML IJ SOLN
1.0000 mg | Freq: Once | INTRAMUSCULAR | Status: DC
  Filled 2023-11-06: qty 1

## 2023-11-06 MED ORDER — ALBUTEROL SULFATE HFA 108 (90 BASE) MCG/ACT IN AERS: 2.0000 | INHALATION_SPRAY | RESPIRATORY_TRACT | Status: DC | PRN

## 2023-11-06 MED ORDER — ENOXAPARIN SODIUM 60 MG/0.6ML IJ SOSY
60.0000 mg | PREFILLED_SYRINGE | INTRAMUSCULAR | Status: DC
  Administered 2023-11-06: 60 mg via SUBCUTANEOUS
  Filled 2023-11-06: qty 0.6

## 2023-11-06 MED ORDER — LORAZEPAM 2 MG/ML IJ SOLN
2.0000 mg | Freq: Once | INTRAMUSCULAR | Status: AC
  Administered 2023-11-06: 2 mg via INTRAVENOUS

## 2023-11-06 MED ORDER — HYDROCODONE BIT-HOMATROP MBR 5-1.5 MG/5ML PO SOLN
5.0000 mL | Freq: Four times a day (QID) | ORAL | Status: DC | PRN
  Filled 2023-11-06: qty 5

## 2023-11-06 MED ORDER — LORAZEPAM 2 MG/ML IJ SOLN: 2.0000 mg | INTRAMUSCULAR | Status: DC | PRN

## 2023-11-06 MED ORDER — HYDRALAZINE HCL 20 MG/ML IJ SOLN: 5.0000 mg | INTRAMUSCULAR | Status: DC | PRN

## 2023-11-06 MED ORDER — CLONAZEPAM 0.5 MG PO TABS: 0.5000 mg | ORAL_TABLET | ORAL | Status: DC | PRN

## 2023-11-06 MED ORDER — SODIUM CHLORIDE 0.9 % IV BOLUS
1000.0000 mL | Freq: Once | INTRAVENOUS | Status: AC
  Administered 2023-11-06: 1000 mL via INTRAVENOUS

## 2023-11-06 MED ORDER — OSELTAMIVIR PHOSPHATE 75 MG PO CAPS
75.0000 mg | ORAL_CAPSULE | Freq: Two times a day (BID) | ORAL | Status: DC
  Filled 2023-11-06: qty 1

## 2023-11-06 MED ORDER — INSULIN ASPART 100 UNIT/ML IJ SOLN: 0.0000 [IU] | Freq: Three times a day (TID) | INTRAMUSCULAR | Status: DC

## 2023-11-06 MED ADMIN — Clonazepam Tab 0.5 MG: 0.5 mg | ORAL | NDC 72888015201

## 2023-11-06 MED FILL — Clonazepam Tab 0.5 MG: 0.5000 mg | ORAL | Qty: 1 | Status: AC

## 2023-11-06 NOTE — ED Notes (Signed)
 Pt up to bathroom.  Family with pt.  Pt alert.

## 2023-11-06 NOTE — ED Triage Notes (Signed)
 Pt to ED via [facility] from home. Pt told son that she thought she was going to have a seizure. EMS reports pt had tonic clonic seizure at 1426. Unknown amount of time but was witnessed by EMS. EMS reports pt stopped seizing before they were able to give medications for seizure. Pt has hx of epilepsy. EMS reports pt oriented to person, situation and place. Pt dx with flu x1 week. Reports no missed dose of medication. Last seizure was 2 years ago.  EMS vitals  BP 172/80 HR 120 SPO2 96% RA CBG 178

## 2023-11-06 NOTE — ED Notes (Signed)
 RN to bedside to assist pt to the toilet. Pt ambulatory and steady to toliet at bedside. Pt back in bed and returned to monitoring equipment. Pt appears fatigued stating she feels like she is going to have another seizure. MD made aware at this time.

## 2023-11-06 NOTE — H&P (Signed)
 History and Physical    Erin Good FMW:[id number] DOB: [birth year] DOA: 11/06/2023  Referring MD/NP/PA:   PCP: Sowles, Krichna, MD   Patient coming from:  The patient is coming from home.     Chief Complaint: seizure  HPI: Erin Good is a 48 y.o. female with medical history significant of seizure, diabetes mellitus, morbid obesity, who presents with seizure.  Per her husband, patient reported to her family member that she felt she was about to have a seizure at about 14:26.  She separately had a seizure that was partly witnessed by EMS and was generalized tonic-clonic in nature. Per EDP, pt had another episode of seizure in ED. Pt was given 2 mg of Ativan  and started Klonopin  0.5 mg every 6 hours in ED. No more seizure episode.   Patient states that she was diagnosed with the Flu few days ago, she has been taking Tamiflu  and prednisone  for flu.  She has dry cough, no shortness of breath or chest pain. She had fever initially which has resolved.  Her temperature is 98.2 in ED.  She has nausea, vomited few times, no abdominal pain.  She had diarrhea few days ago, which has resolved.  Currently no diarrhea.  Denies symptoms of UTI. She reports that she has been compliant with her zonisamide .  She also takes clonazepam  if she feels that his seizure is about to happen.  During interview, patient is lethargic, but orientated x 3.  She moves all extremities normally.   Data reviewed independently and ED Course: pt was found to have WBC 11.8, GFR> 60, bicarbonate 16.  Temperature normal, blood pressure 132/85, heart rate 127, 101, RR 24, 19, oxygen saturation 99% on room air.  Patient is placed in PCU for observation.  ED physician consulted Dr. Matthews of neurology.   EKG: I have personally reviewed.  Sinus rhythm, QTc 431, RAD, Q waves in inferior leads, low voltage, early R wave progression   Review of Systems:   General: no fevers, chills, no body weight gain, has fatigue HEENT: no blurry  vision, hearing changes or sore throat Respiratory: no dyspnea, has coughing, no wheezing CV: no chest pain, no palpitations GI: has nausea, vomiting, no abdominal pain, diarrhea, constipation GU: no dysuria, burning on urination, increased urinary frequency, hematuria  Ext: no leg edema Neuro: no unilateral weakness, numbness, or tingling, no vision change or hearing loss. Has seizure Skin: no rash, no skin tear. MSK: No muscle spasm, no deformity, no limitation of range of movement in spin Heme: No easy bruising.  Travel history: No recent long distant travel.   Allergy:  Allergies  Allergen Reactions   Aspirin Other (See Comments)    Causes seizures   Other Other (See Comments)    Peanuts    Past Medical History:  Diagnosis Date   Diabetes mellitus without complication (HCC)    Morbid obesity with BMI of 40.0-44.9, adult (HCC)    Seizures (HCC)     Past Surgical History:  Procedure Laterality Date   TUBAL LIGATION      Social History:  reports that she has never smoked. She has never used smokeless tobacco. She reports that she does not currently use alcohol. She reports that she does not currently use drugs.  Family History:   I have reviewed with patient, but the patient states that there is no significant family medical history to report.  Prior to Admission medications   Not on File    Physical Exam: Vitals:  11/06/23 1630 11/06/23 1700 11/06/23 1730 11/06/23 1952  BP: (!) 146/62 130/77 132/85   Pulse: (!) 106 (!) 103 (!) 101   Resp: 19 19 19    Temp:    98.1 F (36.7 C)  TempSrc:    Oral  SpO2: 98% 98% 99%   Weight:      Height:       General: Not in acute distress.  Dry mucous membrane HEENT:       Eyes: PERRL, EOMI, no jaundice       ENT: No discharge from the ears and nose, no pharynx injection, no tonsillar enlargement.        Neck: No JVD, no bruit, no mass felt. Heme: No neck lymph node enlargement. Cardiac: S1/S2, RRR, No murmurs, No gallops  or rubs. Respiratory: No rales, wheezing, rhonchi or rubs. GI: Soft, nondistended, nontender, no rebound pain, no organomegaly, BS present. GU: No hematuria Ext: No pitting leg edema bilaterally. 1+DP/PT pulse bilaterally. Musculoskeletal: No joint deformities, No joint redness or warmth, no limitation of ROM in spin. Skin: No rashes.  Neuro: Lethargic, oriented X3, cranial nerves II-XII grossly intact, moves all extremities normally.  Psych: Patient is not psychotic, no suicidal or hemocidal ideation.  Labs on Admission: I have personally reviewed following labs and imaging studies  CBC: Recent Labs  Lab 11/06/23 1536  WBC 11.8*  NEUTROABS 9.5*  HGB 12.0  HCT 36.7  MCV 83.4  PLT 278   Basic Metabolic Panel: Recent Labs  Lab 11/06/23 1536  NA 135  K 3.6  CL 105  CO2 16*  GLUCOSE 163*  BUN 11  CREATININE 0.85  CALCIUM  8.5*   GFR: Estimated Creatinine Clearance: 110.8 mL/min (by C-G formula based on SCr of 0.85 mg/dL). Liver Function Tests: Recent Labs  Lab 11/06/23 1536  AST 36  ALT 25  ALKPHOS 67  BILITOT 0.5  PROT 7.5  ALBUMIN 3.5   No results for input(s): LIPASE, AMYLASE in the last 168 hours. No results for input(s): AMMONIA in the last 168 hours. Coagulation Profile: No results for input(s): INR, PROTIME in the last 168 hours. Cardiac Enzymes: No results for input(s): CKTOTAL, CKMB, CKMBINDEX, TROPONINI in the last 168 hours. BNP (last 3 results) No results for input(s): PROBNP in the last 8760 hours. HbA1C: No results for input(s): HGBA1C in the last 72 hours. CBG: No results for input(s): GLUCAP in the last 168 hours. Lipid Profile: No results for input(s): CHOL, HDL, LDLCALC, TRIG, CHOLHDL, LDLDIRECT in the last 72 hours. Thyroid  Function Tests: No results for input(s): TSH, T4TOTAL, FREET4, T3FREE, THYROIDAB in the last 72 hours. Anemia Panel: No results for input(s): VITAMINB12, FOLATE,  FERRITIN, TIBC, IRON, RETICCTPCT in the last 72 hours. Urine analysis: No results found for: COLORURINE, APPEARANCEUR, LABSPEC, PHURINE, GLUCOSEU, HGBUR, BILIRUBINUR, KETONESUR, PROTEINUR, UROBILINOGEN, NITRITE, LEUKOCYTESUR Sepsis Labs: @LABRCNTIP (procalcitonin:4,lacticidven:4) )No results found for this or any previous visit (from the past 240 hours).   Radiological Exams on Admission: No results found.    Assessment/Plan Principal Problem:   Seizure (HCC) Active Problems:   Diabetes mellitus without complication (HCC)   Metabolic acidosis   Influenza   Obesity, Class III, BMI 40-49.9 (morbid obesity) (HCC)   Assessment and Plan:  Seizure [facility]): Patient reports taking zonisamide  consistently, 400 mg daily, as needed Klonopin .  She has breakthrough seizure, etiology is not clear, may be related to Tamiflu  and prednisone  use.  ED physician discussed with Dr. Matthews of neurology, will continue home zonisamide  and Klonopin  0.5 mg  every 6 hours.  No further medication adjustment needed.  -Place in PCU for observation -See precaution -As needed Ativan  for seizure -Continue home zonisamide , and Klonopin   Diabetes mellitus without complication (HCC): Recent A1c.  Patient is taking Mounjaro  -Sliding scale insulin   Metabolic acidosis: Bicarbonate 16, may be due to seizure -Check lactic acid level  Influenza: Patient does not have fever.  Oxygen saturation 99% on room air. -Supportive care -Continue home Hycodan for cough -As needed albuterol  -Hold Tamiflu  and prednisone   Obesity, Class III, BMI 40-49.9 (morbid obesity) (HCC): Body weight 119.7 kg, BMI 45.35 -Encouraged losing weight -Exercise and healthy diet        DVT ppx:  SQ Lovenox   Code Status: Full code    Family Communication:  Yes, patient's aspirin   at bed side.      Disposition Plan:  Anticipate discharge back to previous environment  Consults called:   ED physician  consulted Dr. Matthews of neurology.  Admission status and Level of care: Progressive:   for obs    Dispo: The patient is from: Home              Anticipated d/c is to: Home              Anticipated d/c date is: 1 day              Patient currently is not medically stable to d/c.    Severity of Illness:  The appropriate patient status for this patient is OBSERVATION. Observation status is judged to be reasonable and necessary in order to provide the required intensity of service to ensure the patient's safety. The patient's presenting symptoms, physical exam findings, and initial radiographic and laboratory data in the context of their medical condition is felt to place them at decreased risk for further clinical deterioration. Furthermore, it is anticipated that the patient will be medically stable for discharge from the hospital within 2 midnights of admission.        Date of Service 11/06/2023    Caleb Exon [facility]   If 7PM-7AM, please contact night-coverage [email or web address] 11/06/2023, 8:55 PM

## 2023-11-06 NOTE — Progress Notes (Signed)
 PHARMACIST - PHYSICIAN COMMUNICATION  CONCERNING:  Enoxaparin  (Lovenox ) for DVT Prophylaxis    RECOMMENDATION: Patient was prescribed enoxaprin 40mg  q24 hours for VTE prophylaxis.   Filed Weights   11/06/23 1526  Weight: 119.7 kg (264 lb)    Body mass index is 41.35 kg/m.  Estimated Creatinine Clearance: 110.8 mL/min (by C-G formula based on SCr of 0.85 mg/dL).   Based on [facility] policy patient is candidate for enoxaparin  0.5mg /kg TBW SQ every 24 hours based on BMI being >30.  DESCRIPTION: Pharmacy has adjusted enoxaparin  dose per [facility] policy.  Patient is now receiving enoxaparin  0.5 mg/kg every 24 hours   Rankin CANDIE Dills, PharmD, [facility] 11/06/2023 7:01 PM

## 2023-11-06 NOTE — ED Notes (Signed)
 MD at bedside. Verbal order for 2mg  Ativan IVP at this time. Pt responsive to voice. Oriented to place, self, and situation. Pt disoriented to time/day.

## 2023-11-06 NOTE — ED Provider Notes (Signed)
 [facility] Provider Note    Event Date/Time   First MD Initiated Contact with Patient 11/06/23 1515     (approximate)   History   Seizures   HPI  Erin Good is a 48 y.o. female with a history of seizure disorder on zonisamide  and diabetes on Mounjaro  who presents with a seizure.  Per EMS, the patient reported to her family member that she felt she was about to have a seizure.  She separately had a seizure that was partly witnessed by EMS and was generalized tonic-clonic in nature.  The patient states that she feels tired but denies acute pain or other acute symptoms.  She states she was diagnosed with influenza last week and is on Tamiflu .  She denies any other recent change in her routine.  She states that she has been sleeping well.  She reports that she has been compliant with her zonisamide .  She also takes clonazepam  if she feels that his seizure is about to happen.   I attempted to review the past medical records, however the patient has no prior records available here.    Physical Exam   Triage Vital Signs: ED Triage Vitals  Encounter Vitals Group     BP      Systolic BP Percentile      Diastolic BP Percentile      Pulse      Resp      Temp      Temp src      SpO2      Weight      Height      Head Circumference      Peak Flow      Pain Score      Pain Loc      Pain Education      Exclude from Growth Chart     Most recent vital signs: Vitals:   11/06/23 1730 11/06/23 1952  BP: 132/85   Pulse: (!) 101   Resp: 19   Temp:  98.1 F (36.7 C)  SpO2: 99%      General: Alert and oriented, no distress.  CV:  Good peripheral perfusion.  Resp:  Normal effort.  Abd:  No distention.  Other:  EOMI.  PERRLA.  No photophobia.  No facial droop.  Normal speech.  Motor intact in all extremities.  Normal coordination.   ED Results / Procedures / Treatments   Labs (all labs ordered are listed, but only abnormal results are  displayed) Labs Reviewed  CBC WITH DIFFERENTIAL/PLATELET - Abnormal; Notable for the following components:      Result Value   WBC 11.8 (*)    Neutro Abs 9.5 (*)    All other components within normal limits  COMPREHENSIVE METABOLIC PANEL - Abnormal; Notable for the following components:   CO2 16 (*)    Glucose, Bld 163 (*)    Calcium  8.5 (*)    All other components within normal limits  LACTIC ACID, PLASMA  PREGNANCY, URINE  URINE DRUG SCREEN, QUALITATIVE ([facility] ONLY)  HIV ANTIBODY (ROUTINE TESTING W REFLEX)  BASIC METABOLIC PANEL  CBC  LACTIC ACID, PLASMA  CBG MONITORING, ED     EKG  ED ECG REPORT I, Waylon Cassis, the attending physician, personally viewed and interpreted this ECG.  Date: 11/06/2023 EKG Time: 1537 Rate: 104 Rhythm: Sinus tachycardia QRS Axis: Borderline right axis Intervals: normal ST/T Wave abnormalities: normal Narrative Interpretation: no evidence of acute ischemia    RADIOLOGY  PROCEDURES:  Critical Care performed: No  Procedures   MEDICATIONS ORDERED IN ED: Medications  clonazePAM  (KLONOPIN ) tablet 0.5 mg (0.5 mg Oral Given 11/06/23 1845)  LORazepam  (ATIVAN ) injection 2 mg (has no administration in time range)  insulin  aspart (novoLOG) injection 0-9 Units (has no administration in time range)  insulin  aspart (novoLOG) injection 0-5 Units (has no administration in time range)  ondansetron  (ZOFRAN ) injection 4 mg (has no administration in time range)  hydrALAZINE (APRESOLINE) injection 5 mg (has no administration in time range)  acetaminophen  (TYLENOL ) tablet 650 mg (has no administration in time range)  enoxaparin  (LOVENOX ) injection 60 mg (has no administration in time range)  HYDROcodone  bit-homatropine (HYCODAN) 5-1.5 MG/5ML syrup 5 mL (has no administration in time range)  albuterol  (PROVENTIL ) (2.5 MG/3ML) 0.083% nebulizer solution 2.5 mg (has no administration in time range)  LORazepam  (ATIVAN ) injection 2 mg (2 mg  Intravenous Given 11/06/23 1613)  sodium chloride  0.9 % bolus 1,000 mL (1,000 mLs Intravenous New Bag/Given 11/06/23 2048)     IMPRESSION / MDM / ASSESSMENT AND PLAN / ED COURSE  I reviewed the triage vital signs and the nursing notes.  48 year old female with PMH as noted above presents after a breakthrough seizure.  The patient states that her last seizure was over a year ago.  On exam she appears somewhat sleepy but is fully arousable, oriented, and has a nonfocal neurologic exam.  Vital signs are normal except for borderline tachycardia.  She was recently treated for the flu.  Differential diagnosis includes, but is not limited to, epileptic seizure, nonepileptic seizure, syncope.  We will obtain basic labs, EKG, and reassess.  At this time there is no evidence of status epilepticus or indication for additional medication; the patient reports that she has been compliant with her zonisamide .  Patient's presentation is most consistent with acute complicated illness / injury requiring diagnostic workup.  The patient is on the cardiac monitor to evaluate for evidence of arrhythmia and/or significant heart rate changes.  ----------------------------------------- 4:51 PM on 11/06/2023 -----------------------------------------  The patient had another brief seizure lasting less than a minute.  She is now postictal.  We have given a dose of IV Ativan .  I was now able to review prior records in care everywhere; the patient follows with neurology at [facility] and was last seen there on 08/15/2023.  I confirmed that she is on zonisamide  and clonazepam  and her last seizure was in February 2023.  ----------------------------------------- 7:00 PM on 11/06/2023 -----------------------------------------  I consulted Dr. Matthews from neurology who recommended putting the patient on scheduled doses of clonazepam  and admitting her to the hospitalist for observation, although she does not recommend EEG at this time.   I then consulted Dr. Hilma from the hospitalist service; based on our discussion he agrees to evaluate the patient for admission.   FINAL CLINICAL IMPRESSION(S) / ED DIAGNOSES   Final diagnoses:  Seizure disorder (HCC)     Rx / DC Orders   ED Discharge Orders     None        Note:  This document was prepared using Dragon voice recognition software and may include unintentional dictation errors.    Jacolyn Pae, MD 11/06/23 2127

## 2023-11-07 DIAGNOSIS — R569 Unspecified convulsions: Secondary | ICD-10-CM | POA: Diagnosis not present

## 2023-11-07 LAB — CBC
HCT: 33.4 % — ABNORMAL LOW (ref 36.0–46.0)
Hemoglobin: 11.3 g/dL — ABNORMAL LOW (ref 12.0–15.0)
MCH: 28.3 pg (ref 26.0–34.0)
MCHC: 33.8 g/dL (ref 30.0–36.0)
MCV: 83.5 fL (ref 80.0–100.0)
Platelets: 275 10*3/uL (ref 150–400)
RBC: 4 MIL/uL (ref 3.87–5.11)
RDW: 13.2 % (ref 11.5–15.5)
WBC: 11.2 10*3/uL — ABNORMAL HIGH (ref 4.0–10.5)
nRBC: 0 % (ref 0.0–0.2)

## 2023-11-07 LAB — BASIC METABOLIC PANEL
Anion gap: 10 (ref 5–15)
BUN: 7 mg/dL (ref 6–20)
CO2: 17 mmol/L — ABNORMAL LOW (ref 22–32)
Calcium: 8.1 mg/dL — ABNORMAL LOW (ref 8.9–10.3)
Chloride: 108 mmol/L (ref 98–111)
Creatinine, Ser: 0.62 mg/dL (ref 0.44–1.00)
GFR, Estimated: >60 mL/min (ref >60)
Glucose, Bld: 92 mg/dL (ref 70–99)
Potassium: 2.9 mmol/L — ABNORMAL LOW (ref 3.5–5.1)
Sodium: 135 mmol/L (ref 135–145)

## 2023-11-07 LAB — HIV ANTIBODY (ROUTINE TESTING W REFLEX): HIV Screen 4th Generation wRfx: NONREACTIVE

## 2023-11-07 LAB — CBG MONITORING, ED: Glucose-Capillary: 91 mg/dL (ref 70–99)

## 2023-11-07 MED ORDER — ZONISAMIDE 100 MG PO CAPS: 400.0000 mg | ORAL_CAPSULE | Freq: Every day | ORAL | Status: DC

## 2023-11-07 MED ORDER — POTASSIUM CHLORIDE CRYS ER 20 MEQ PO TBCR
40.0000 meq | EXTENDED_RELEASE_TABLET | Freq: Once | ORAL | Status: AC
Start: 2023-11-07 — End: 2023-11-07
  Administered 2023-11-07: 40 meq via ORAL
  Filled 2023-11-07: qty 2

## 2023-11-07 MED ADMIN — Clonazepam Tab 0.5 MG: 0.5 mg | ORAL | NDC 72888015201

## 2023-11-07 NOTE — ED Notes (Signed)
 Pt to be discharged after recs from Neurology.

## 2023-11-07 NOTE — Discharge Summary (Signed)
 Physician Discharge Summary   Patient: Erin Good MRN: [id number] DOB: 06/29/1976  Admit date:     11/06/2023  Discharge date: 11/07/23  Discharge Physician: Delon Herald   PCP: Sowles, Krichna, MD   Recommendations at discharge:   Stop taking Tamiflu , prednisone , and Hycodan cough syrup Continue Zonisamide  and Klonopin  Monitor for breakthrough seizures Follow up with neurology Follow up with Dr. Glenard within 1 week; you will need repeat labs at that time [place] state no driving or operating motorized vehicles until seizure-free for at least 6 months  Discharge Diagnoses: Principal Problem:   Seizure [facility]) Active Problems:   Diabetes mellitus without complication (HCC)   Metabolic acidosis   Influenza   Obesity, Class III, BMI 40-49.9 (morbid obesity) [facility])   Hospital Course: 48yo with h/o seizure d/o, DM, and morbid obesity who presented on 1/1 with 3 breakthrough seizures. She was recently diagnosed with influenza and is taking prednisone  and Tamiflu .  No further seizures since admission.  Assessment and Plan:  Seizure  Patient with known seizure d/o presenting with breakthrough seizures Reports compliance with zonisamide , as needed Klonopin  Most likely etiology is related to Tamiflu , hydrocodone , and prednisone  use ED physician discussed with Dr. Matthews of neurology Will continue home zonisamide  and Klonopin  0.5 mg as needed No further medication adjustment needed No further seizures, Dr. Matthews agrees that she is appropriate for dc Unfortunately, [place] statutes say that patient should not operate a moving vehicle for at least 6 months following a seizure   Diabetes mellitus without complication Continue Mounjaro  PCP f/u   Metabolic acidosis Likely related to seizures Negative lactic acid level Anticipate spontaneous resolution  Hypokalemia Repleted F/u with PCP   Influenza Patient does not have fever, normal O2 sats Supportive care Continue home  Hycodan for cough As needed albuterol  Stop Tamiflu , prednisone , and Hycodan   Obesity, Class III, BMI 40-49.9 (morbid obesity)  Body mass index is 41.35 kg/m.[name]  Weight loss should be encouraged Continue Mounjaro  Outpatient PCP/bariatric medicine/bariatric surgery f/u encouraged        Consultants: Neurology (telephone only)   Procedures: None   Antibiotics: None     Pain control - [place]  Controlled Substance Reporting System database was reviewed. and patient was instructed, not to drive, operate heavy machinery, perform activities at heights, swimming or participation in water  activities or provide baby-sitting services while on Pain, Sleep and Anxiety Medications; until their outpatient Physician has advised to do so again. Also recommended to not to take more than prescribed Pain, Sleep and Anxiety Medications.    Disposition: Home Diet recommendation:  Carb modified DISCHARGE MEDICATION: Allergies as of 11/07/2023       Reactions   Aspirin Other (See Comments)   Causes seizures   Other Other (See Comments)   Peanuts        Medication List     STOP taking these medications    HYDROcodone  bit-homatropine 5-1.5 MG/5ML syrup Commonly known as: HYCODAN   Lantus  SoloStar 100 UNIT/ML Solostar Pen Generic drug: insulin  glargine   metFORMIN  500 MG 24 hr tablet Commonly known as: GLUCOPHAGE -XR   oseltamivir  75 MG capsule Commonly known as: TAMIFLU    predniSONE  20 MG tablet Commonly known as: DELTASONE        TAKE these medications    benzonatate  200 MG capsule Commonly known as: TESSALON  Take 200 mg by mouth 3 (three) times daily as needed.   clonazePAM  0.5 MG tablet Commonly known as: KLONOPIN  Take 0.5 mg by mouth daily as needed.  Mounjaro  15 MG/0.5ML Pen Generic drug: tirzepatide  Inject 15 mg into the skin once a week.   zonisamide  100 MG capsule Commonly known as: ZONEGRAN  Take 400 mg by mouth at bedtime.        Discharge  Exam:    Subjective: Feeling better, no further seizures, wants to go home   Objective: Vitals:   11/07/23 0900 11/07/23 1000  BP: 124/77 101/85  Pulse: 91 95  Resp: (!) 21 16  Temp:    SpO2: 100% 99%   No intake or output data in the 24 hours ending 11/07/23 1153 Filed Weights   11/06/23 1526  Weight: 119.7 kg    Exam:  General:  Appears calm and comfortable and is in NAD Eyes:  EOMI, normal lids, iris ENT:  grossly normal hearing, lips & tongue, mmm; appropriate dentition Neck:  no LAD, masses or thyromegaly Cardiovascular:  RRR, no m/r/g. No LE edema.  Respiratory:   CTA bilaterally with no wheezes/rales/rhonchi.  Normal respiratory effort. Abdomen:  soft, NT, ND Skin:  no rash or induration seen on limited exam Musculoskeletal:  grossly normal tone BUE/BLE, good ROM, no bony abnormality Psychiatric:  grossly normal mood and affect, speech fluent and appropriate, AOx3 Neurologic:  CN 2-12 grossly intact, moves all extremities in coordinated fashion, sensation intact  Data Reviewed: I have reviewed the patient's lab results since admission.  Pertinent labs for today include:   K+ 2.9 CO2 17 WBC 11.2 Hgb 11.3    Condition at discharge: improving  The results of significant diagnostics from this hospitalization (including imaging, microbiology, ancillary and laboratory) are listed below for reference.   Imaging Studies: No results found.  Microbiology: No results found for this or any previous visit.  Labs: CBC: Recent Labs  Lab 11/06/23 1536 11/07/23 0535  WBC 11.8* 11.2*  NEUTROABS 9.5*  --   HGB 12.0 11.3*  HCT 36.7 33.4*  MCV 83.4 83.5  PLT 278 275   Basic Metabolic Panel: Recent Labs  Lab 11/06/23 1536 11/07/23 0535  NA 135 135  K 3.6 2.9*  CL 105 108  CO2 16* 17*  GLUCOSE 163* 92  BUN 11 7  CREATININE 0.85 0.62  CALCIUM  8.5* 8.1*   Liver Function Tests: Recent Labs  Lab 11/06/23 1536  AST 36  ALT 25  ALKPHOS 67  BILITOT  0.5  PROT 7.5  ALBUMIN 3.5   CBG: Recent Labs  Lab 11/06/23 2124 11/07/23 0714  GLUCAP 95 91    Discharge time spent: greater than 30 minutes.  Signed: Delon Herald, MD [facility] 11/07/2023

## 2023-12-30 ENCOUNTER — Ambulatory Visit (INDEPENDENT_AMBULATORY_CARE_PROVIDER_SITE_OTHER): Payer: 59 | Admitting: Family Medicine

## 2023-12-30 VITALS — BP 114/74 | HR 92 | Resp 16 | Ht 67.0 in | Wt 251.7 lb

## 2023-12-30 DIAGNOSIS — Z1231 Encounter for screening mammogram for malignant neoplasm of breast: Secondary | ICD-10-CM

## 2023-12-30 DIAGNOSIS — Z1211 Encounter for screening for malignant neoplasm of colon: Secondary | ICD-10-CM

## 2023-12-30 DIAGNOSIS — Z0000 Encounter for general adult medical examination without abnormal findings: Secondary | ICD-10-CM

## 2023-12-30 DIAGNOSIS — E1169 Type 2 diabetes mellitus with other specified complication: Secondary | ICD-10-CM | POA: Diagnosis not present

## 2023-12-30 DIAGNOSIS — D649 Anemia, unspecified: Secondary | ICD-10-CM | POA: Diagnosis not present

## 2023-12-30 DIAGNOSIS — Z79899 Other long term (current) drug therapy: Secondary | ICD-10-CM

## 2023-12-30 DIAGNOSIS — E785 Hyperlipidemia, unspecified: Secondary | ICD-10-CM

## 2023-12-30 DIAGNOSIS — N6321 Unspecified lump in the left breast, upper outer quadrant: Secondary | ICD-10-CM

## 2023-12-30 DIAGNOSIS — Z23 Encounter for immunization: Secondary | ICD-10-CM

## 2023-12-30 DIAGNOSIS — Z0001 Encounter for general adult medical examination with abnormal findings: Secondary | ICD-10-CM | POA: Diagnosis not present

## 2023-12-30 NOTE — Progress Notes (Addendum)
 Name: TANNA LOEFFLER   MRN: [id number]    DOB: 26-Dec-1975   Date:12/30/2023       Progress Note  Subjective  Chief Complaint  Chief Complaint  Patient presents with   Annual Exam    HPI  Patient presents for annual CPE.  Diet: cooking at home Exercise: continue regular physical activity   Last Eye Exam:  she is going to schedule it  Last Dental Exam: completed  Flowsheet Row Office Visit from 12/30/2023 in [facility]  AUDIT-C Score 0       Depression: Phq 9 is  negative    12/30/2023    2:12 PM 01/25/2023    1:39 PM 06/26/2022    1:45 PM 12/22/2021    1:00 PM 11/29/2020   11:18 AM  Depression screen PHQ 2/9  Decreased Interest 0 0 0 0 0  Down, Depressed, Hopeless 0 0 0 0 0  PHQ - 2 Score 0 0 0 0 0  Altered sleeping 0 0 0 0 0  Tired, decreased energy 0 0 3 0 3  Change in appetite 0 0 0 0 3  Feeling bad or failure about yourself  0 0 0 0 0  Trouble concentrating 0 0 0 0 0  Moving slowly or fidgety/restless 0 0 0 0 0  Suicidal thoughts 0 0 0 0 0  PHQ-9 Score 0 0 3 0 6  Difficult doing work/chores Not difficult at all Not difficult at all   Not difficult at all   Hypertension: BP Readings from Last 3 Encounters:  12/30/23 114/74  11/07/23 137/63  11/02/23 (!) 157/85   Obesity: Wt Readings from Last 3 Encounters:  12/30/23 251 lb 11.2 oz (114.2 kg)  11/06/23 264 lb (119.7 kg)  07/04/23 261 lb 3.2 oz (118.5 kg)   BMI Readings from Last 3 Encounters:  12/30/23 39.42 kg/m  11/06/23 41.35 kg/m  07/04/23 40.91 kg/m     Vaccines: reviewed with the patient.   Hep C Screening: completed STD testing and prevention (HIV/chl/gon/syphilis): N/A Intimate partner violence: negative screen  Sexual History : no pain during intercourse Menstrual History/LMP/Abnormal Bleeding: no cycles since Dec Discussed importance of follow up if any post-menopausal bleeding: not applicable  Incontinence Symptoms: negative for symptoms   Breast  cancer:  - Last Mammogram: needs to schedule mammogram  - BRCA gene screening: paternal grandmother had breast cancer   Osteoporosis Prevention : Discussed high calcium and vitamin D supplementation, weight bearing exercises Bone density :not applicable   Cervical cancer screening: up-to-date  Skin cancer: Discussed monitoring for atypical lesions  Colorectal cancer: referral placed  today, she has noticed some lower abdominal pain and change in bowel movements, constipation. She is on high dose of Mounjaro Lung cancer:  Low Dose CT Chest recommended if Age 67-80 years, 20 pack-year currently smoking OR have quit w/in 15years. Patient does not qualify for screen   ECG: 2025  Advanced Care Planning: A voluntary discussion about advance care planning including the explanation and discussion of advance directives.  Discussed health care proxy and Living will, and the patient was able to identify a health care proxy as husband.  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   Dyslipidemia 07/02/2008   Leukocytosis 07/29/2007    Past Surgical History:  Procedure Laterality Date   BREAST BIOPSY Left 2018   benign   CESAREAN SECTION     X 2   COLONOSCOPY WITH PROPOFOL N/A 09/26/2022   Procedure: COLONOSCOPY WITH PROPOFOL;  Surgeon: Toney Reil, MD;  Location: [facility]  ENDOSCOPY;  Service: Gastroenterology;  Laterality: N/A;   IRRIGATION AND DEBRIDEMENT SHOULDER Left 10/12/2020   Procedure: IRRIGATION AND DEBRIDEMENT SHOULDER;  Surgeon: Bjorn Pippin, MD;  Location: [facility];  Service: Orthopedics;  Laterality: Left;   RIGHT OOPHORECTOMY Right 2001   benign tumor   TUBAL LIGATION  2007   TUBAL LIGATION      Family History  Problem Relation Age of Onset   Diabetes Mother    Healthy Father    Cancer Maternal Grandmother    Breast cancer Paternal Grandmother 61   Cancer Paternal Grandmother    Cancer Paternal Aunt    Heart disease Neg Hx     Social History   Socioeconomic History   Marital status: Married    Spouse name: Christiane Ha   Number of children: 2   Years of education: Automotive engineer   Highest education level: Master's degree (e.g., MA, MS, MEng, MEd, MSW, MBA)  Occupational History   Occupation: accountant  Tobacco Use   Smoking status: Never   Smokeless tobacco: Never  Vaping Use   Vaping status: Never Used  Substance and Sexual Activity   Alcohol use: Not Currently    Comment: occ   Drug use: Not Currently   Sexual activity: Yes    Partners: Male    Birth control/protection: Surgical    Comment: tubial lig  Other Topics Concern   Not on file  Social History Narrative   ** Merged History Encounter **       Social Drivers of Health   Financial Resource Strain: Low Risk  (12/30/2023)   Overall Financial Resource Strain (CARDIA)    Difficulty of Paying Living Expenses: Not hard at all  Food Insecurity: No Food Insecurity (12/30/2023)   Hunger Vital Sign    Worried About Running Out of Food in the Last Year: Never true    Ran Out of Food in the Last Year: Never true  Transportation Needs: No Transportation Needs (12/30/2023)   PRAPARE - Administrator, Civil Service (Medical): No    Lack of Transportation (Non-Medical): No  Physical Activity: Sufficiently Active (12/30/2023)   Exercise Vital Sign    Days of Exercise per  Week: 2 days    Minutes of Exercise per Session: 90 min  Stress: No Stress Concern Present (12/30/2023)   [employer] of Occupational Health - Occupational Stress Questionnaire    Feeling of Stress : Only a little  Social Connections: Unknown (12/30/2023)   Social Connection and Isolation Panel [NHANES]    Frequency of Communication with Friends and Family: Three times a week    Frequency of Social Gatherings with Friends and Family: Twice a week    Attends Religious Services: More than 4 times per year    Active Member of Clubs or Organizations: Yes  Attends Banker Meetings: More than 4 times per year    Marital Status: Patient declined  Intimate Partner Violence: Not At Risk (12/30/2023)   Humiliation, Afraid, Rape, and Kick questionnaire    Fear of Current or Ex-Partner: No    Emotionally Abused: No    Physically Abused: No    Sexually Abused: No     Current Outpatient Medications:    Cholecalciferol (VITAMIN D) 50 MCG (2000 UT) CAPS, Take 1 capsule by mouth daily at 12 noon., Disp: , Rfl:    clonazePAM (KLONOPIN) 0.5 MG tablet, Take 0.5 mg by mouth daily as needed., Disp: , Rfl:    EPINEPHrine 0.3 mg/0.3 mL IJ SOAJ injection, Inject 0.3 mg into the muscle as needed for anaphylaxis (for anaphylaxis). , Disp: , Rfl:    insulin glargine (LANTUS SOLOSTAR) 100 UNIT/ML Solostar Pen, INJECT SUBCUTANEOUSLY 30-35 UNITS  DAILY, Disp: , Rfl:    Insulin Pen Needle 32G X 4 MM MISC, Use 1x a day, Disp: 100 each, Rfl: 3   metFORMIN (GLUCOPHAGE-XR) 500 MG 24 hr tablet, Take 4 tablets (2,000 mg total) by mouth daily with supper., Disp: 360 tablet, Rfl: 3   MOUNJARO 15 MG/0.5ML Pen, INJECT THE CONTENTS OF ONE PEN  SUBCUTANEOUSLY WEEKLY AS  DIRECTED, Disp: 6 mL, Rfl: 3   OneTouch Delica Lancets 33G MISC, Use 2x a day with OneTouch Verio Flex, Disp: 200 each, Rfl: 3   ONETOUCH VERIO test strip, USE 2X A DAY WITH ONETOUCH VERIO FLEX, Disp: 50 strip, Rfl: 15   rosuvastatin (CRESTOR)  20 MG tablet, Take 1 tablet (20 mg total) by mouth daily., Disp: 90 tablet, Rfl: 3   zonisamide (ZONEGRAN) 100 MG capsule, Take 400 mg by mouth at bedtime., Disp: , Rfl:   Allergies  Allergen Reactions   Peanuts [Peanut Oil] Anaphylaxis   Aspirin Other (See Comments)    Does not take because of her epilepsy/seizure    Aspirin Other (See Comments)    Causes seizures   Other Other (See Comments)    Peanuts     ROS  Constitutional: Negative for fever, positive for  weight change.  Respiratory: Negative for cough and shortness of breath.   Cardiovascular: Negative for chest pain or palpitations.  Gastrointestinal: Negative for abdominal pain, positive for  bowel changes - constipation since higher dose of GLP-1 agonist.  Musculoskeletal: Negative for gait problem or joint swelling.  Skin: Negative for rash.  Neurological: Negative for dizziness or headache.  No other specific complaints in a complete review of systems (except as listed in HPI above).   Objective  Vitals:   12/30/23 1410  BP: 114/74  Pulse: 92  Resp: 16  SpO2: 96%  Weight: 251 lb 11.2 oz (114.2 kg)  Height: 5\' 7"  (1.702 m)    Body mass index is 39.42 kg/m.  Physical Exam  Constitutional: Patient appears well-developed and well-nourished. No distress.  HENT: Head: Normocephalic and atraumatic. Ears: B TMs ok, no erythema or effusion; Nose: Nose normal. Mouth/Throat: Oropharynx is clear and moist. No oropharyngeal exudate.  Eyes: Conjunctivae and EOM are normal. Pupils are equal, round, and reactive to light. No scleral icterus.  Neck: Normal range of motion. Neck supple. No JVD present. No thyromegaly present.  Cardiovascular: Normal rate, regular rhythm and normal heart sounds.  No murmur heard. No BLE edema. Pulmonary/Chest: Effort normal and breath sounds normal. No respiratory distress. Abdominal: Soft. Bowel sounds are normal, no distension. There is no tenderness. no masses Breast: left breast  lump  outer upper quadrant,  no nipple discharge or rashes FEMALE GENITALIA:  Not done  RECTAL: not done  Musculoskeletal: Normal range of motion, no joint effusions. No gross deformities Neurological: he is alert and oriented to person, place, and time. No cranial nerve deficit. Coordination, balance, strength, speech and gait are normal.  Skin: Skin is warm and dry. No rash noted. No erythema.  Psychiatric: Patient has a normal mood and affect. behavior is normal. Judgment and thought content normal.     Assessment & Plan  1. Well adult exam (Primary)  - MM 3D SCREENING MAMMOGRAM BILATERAL BREAST; Future - Ambulatory referral to Gastroenterology - CBC with Differential/Platelet - Iron, TIBC and Ferritin Panel - B12 and Folate Panel - Lipid panel - COMPLETE METABOLIC PANEL WITH GFR - VITAMIN D 25 Hydroxy (Vit-D Deficiency, Fractures)  2. Encounter for screening mammogram for malignant neoplasm of breast  - MM 3D SCREENING MAMMOGRAM BILATERAL BREAST; Future  3. Screening for colon cancer  - Ambulatory referral to Gastroenterology  4. Dyslipidemia associated with type 2 diabetes mellitus (HCC)  - Lipid panel - COMPLETE METABOLIC PANEL WITH GFR  5. Anemia, unspecified type  - CBC with Differential/Platelet - Iron, TIBC and Ferritin Panel - B12 and Folate Panel  6. Long-term use of high-risk medication  - CBC with Differential/Platelet - COMPLETE METABOLIC PANEL WITH GFR - VITAMIN D 25 Hydroxy (Vit-D Deficiency, Fractures)  7. Need for pneumococcal 20-valent conjugate vaccination  Out of stock   8. Mass of upper outer quadrant of left breast  - MM 3D DIAGNOSTIC MAMMOGRAM BILATERAL BREAST; Future - [place] LIMITED ULTRASOUND INCLUDING AXILLA LEFT BREAST ; Future    -USPSTF grade A and B recommendations reviewed with patient; age-appropriate recommendations, preventive care, screening tests, etc discussed and encouraged; healthy living encouraged; see AVS for patient  education given to patient -Discussed importance of 150 minutes of physical activity weekly, eat two servings of fish weekly, eat one serving of tree nuts ( cashews, pistachios, pecans, almonds.Marland Kitchen) every other day, eat 6 servings of fruit/vegetables daily and drink plenty of water and avoid sweet beverages.   -Reviewed Health Maintenance: Yes.

## 2023-12-30 NOTE — Addendum Note (Signed)
 Addended by: Ruel Favors on: 12/30/2023 03:20 PM   Modules accepted: Orders

## 2023-12-31 ENCOUNTER — Telehealth: Payer: Self-pay

## 2023-12-31 ENCOUNTER — Other Ambulatory Visit: Payer: Self-pay

## 2023-12-31 ENCOUNTER — Encounter: Payer: Self-pay | Admitting: Family Medicine

## 2023-12-31 DIAGNOSIS — Z1211 Encounter for screening for malignant neoplasm of colon: Secondary | ICD-10-CM

## 2023-12-31 LAB — CBC WITH DIFFERENTIAL/PLATELET
Absolute Lymphocytes: 3161 {cells}/uL (ref 850–3900)
Absolute Monocytes: 545 {cells}/uL (ref 200–950)
Basophils Absolute: 30 {cells}/uL (ref 0–200)
Basophils Relative: 0.3 %
Eosinophils Absolute: 10 {cells}/uL — ABNORMAL LOW (ref 15–500)
Eosinophils Relative: 0.1 %
HCT: 37 % (ref 35.0–45.0)
Hemoglobin: 12.2 g/dL (ref 11.7–15.5)
MCH: 26.7 pg — ABNORMAL LOW (ref 27.0–33.0)
MCHC: 33 g/dL (ref 32.0–36.0)
MCV: 81 fL (ref 80.0–100.0)
MPV: 12 fL (ref 7.5–12.5)
Monocytes Relative: 5.4 %
Neutro Abs: 6353 {cells}/uL (ref 1500–7800)
Neutrophils Relative %: 62.9 %
Platelets: 329 10*3/uL (ref 140–400)
RBC: 4.57 10*6/uL (ref 3.80–5.10)
RDW: 13.3 % (ref 11.0–15.0)
Total Lymphocyte: 31.3 %
WBC: 10.1 10*3/uL (ref 3.8–10.8)

## 2023-12-31 LAB — LIPID PANEL
Cholesterol: 229 mg/dL — ABNORMAL HIGH (ref <200)
HDL: 47 mg/dL — ABNORMAL LOW (ref >=50)
LDL Cholesterol (Calc): 159 mg/dL — ABNORMAL HIGH
Non-HDL Cholesterol (Calc): 182 mg/dL — ABNORMAL HIGH (ref <130)
Total CHOL/HDL Ratio: 4.9 (calc) (ref <5.0)
Triglycerides: 112 mg/dL (ref <150)

## 2023-12-31 LAB — COMPLETE METABOLIC PANEL WITH GFR
AG Ratio: 1.3 (calc) (ref 1.0–2.5)
ALT: 7 U/L (ref 6–29)
AST: 12 U/L (ref 10–35)
Albumin: 4.4 g/dL (ref 3.6–5.1)
Alkaline phosphatase (APISO): 85 U/L (ref 31–125)
BUN: 13 mg/dL (ref 7–25)
CO2: 27 mmol/L (ref 20–32)
Calcium: 9.7 mg/dL (ref 8.6–10.2)
Chloride: 106 mmol/L (ref 98–110)
Creat: 0.84 mg/dL (ref 0.50–0.99)
Globulin: 3.5 g/dL (ref 1.9–3.7)
Glucose, Bld: 87 mg/dL (ref 65–99)
Potassium: 4 mmol/L (ref 3.5–5.3)
Sodium: 140 mmol/L (ref 135–146)
Total Bilirubin: 0.3 mg/dL (ref 0.2–1.2)
Total Protein: 7.9 g/dL (ref 6.1–8.1)
eGFR: 87 mL/min/{1.73_m2} (ref >=60)

## 2023-12-31 LAB — IRON,TIBC AND FERRITIN PANEL
%SAT: 21 % (ref 16–45)
Ferritin: 34 ng/mL (ref 16–232)
Iron: 68 ug/dL (ref 40–190)
TIBC: 327 ug/dL (ref 250–450)

## 2023-12-31 LAB — VITAMIN D 25 HYDROXY (VIT D DEFICIENCY, FRACTURES): Vit D, 25-Hydroxy: 28 ng/mL — ABNORMAL LOW (ref 30–100)

## 2023-12-31 LAB — B12 AND FOLATE PANEL
Folate: 5.1 ng/mL — ABNORMAL LOW
Vitamin B-12: 383 pg/mL (ref 200–1100)

## 2023-12-31 MED ORDER — GOLYTELY 236 G PO SOLR: 4000.0000 mL | Freq: Once | ORAL | 0 refills | Status: AC

## 2023-12-31 NOTE — Telephone Encounter (Signed)
 Gastroenterology Pre-Procedure Review  Request Date: 03/20/24 Requesting Physician: Dr. Allegra Lai  PATIENT REVIEW QUESTIONS: The patient responded to the following health history questions as indicated:    1. Are you having any GI issues? no 2. Do you have a personal history of Polyps? no.  This is a repeat colonoscopy due to poor prep 09/26/22 Dr. Allegra Lai recommended a 2 day prep 3. Do you have a family history of Colon Cancer or Polyps? no 4. Diabetes Mellitus? yes (takes Metformin has been advised to stop 2 days prior to colonoscopy. Takes Greggory Keen has been advised to stop 7 days prior to colonoscopy) 5. Joint replacements in the past 12 months?no 6. Major health problems in the past 3 months?no 7. Any artificial heart valves, MVP, or defibrillator?no    MEDICATIONS & ALLERGIES:    Patient reports the following regarding taking any anticoagulation/antiplatelet therapy:   Plavix, Coumadin, Eliquis, Xarelto, Lovenox, Pradaxa, Brilinta, or Effient? no Aspirin? no  Patient confirms/reports the following medications:  Current Outpatient Medications  Medication Sig Dispense Refill   Cholecalciferol (VITAMIN D) 50 MCG (2000 UT) CAPS Take 1 capsule by mouth daily at 12 noon.     clonazePAM (KLONOPIN) 0.5 MG tablet Take 0.5 mg by mouth daily as needed.     EPINEPHrine 0.3 mg/0.3 mL IJ SOAJ injection Inject 0.3 mg into the muscle as needed for anaphylaxis (for anaphylaxis).      insulin glargine (LANTUS SOLOSTAR) 100 UNIT/ML Solostar Pen INJECT SUBCUTANEOUSLY 30-35 UNITS  DAILY     Insulin Pen Needle 32G X 4 MM MISC Use 1x a day 100 each 3   metFORMIN (GLUCOPHAGE-XR) 500 MG 24 hr tablet Take 4 tablets (2,000 mg total) by mouth daily with supper. 360 tablet 3   MOUNJARO 15 MG/0.5ML Pen INJECT THE CONTENTS OF ONE PEN  SUBCUTANEOUSLY WEEKLY AS  DIRECTED 6 mL 3   OneTouch Delica Lancets 33G MISC Use 2x a day with OneTouch Verio Flex 200 each 3   ONETOUCH VERIO test strip USE 2X A DAY WITH ONETOUCH  VERIO FLEX 50 strip 15   rosuvastatin (CRESTOR) 20 MG tablet Take 1 tablet (20 mg total) by mouth daily. 90 tablet 3   zonisamide (ZONEGRAN) 100 MG capsule Take 400 mg by mouth at bedtime.     No current facility-administered medications for this visit.    Patient confirms/reports the following allergies:  Allergies  Allergen Reactions   Peanuts [Peanut Oil] Anaphylaxis   Aspirin Other (See Comments)    Does not take because of her epilepsy/seizure    Aspirin Other (See Comments)    Causes seizures   Other Other (See Comments)    Peanuts    No orders of the defined types were placed in this encounter.   AUTHORIZATION INFORMATION Primary Insurance: 1D#: Group #:  Secondary Insurance: 1D#: Group #:  SCHEDULE INFORMATION: Date: 03/20/24 Time: Location: [facility]

## 2024-01-03 ENCOUNTER — Ambulatory Visit (INDEPENDENT_AMBULATORY_CARE_PROVIDER_SITE_OTHER): Payer: 59 | Admitting: Family Medicine

## 2024-01-03 ENCOUNTER — Encounter: Payer: Self-pay | Admitting: Family Medicine

## 2024-01-03 VITALS — BP 122/78 | HR 101 | Resp 16 | Ht 67.0 in | Wt 250.4 lb

## 2024-01-03 DIAGNOSIS — E785 Hyperlipidemia, unspecified: Secondary | ICD-10-CM

## 2024-01-03 DIAGNOSIS — E538 Deficiency of other specified B group vitamins: Secondary | ICD-10-CM

## 2024-01-03 DIAGNOSIS — R1032 Left lower quadrant pain: Secondary | ICD-10-CM | POA: Diagnosis not present

## 2024-01-03 DIAGNOSIS — E65 Localized adiposity: Secondary | ICD-10-CM

## 2024-01-03 DIAGNOSIS — L304 Erythema intertrigo: Secondary | ICD-10-CM

## 2024-01-03 DIAGNOSIS — E1169 Type 2 diabetes mellitus with other specified complication: Secondary | ICD-10-CM | POA: Diagnosis not present

## 2024-01-03 DIAGNOSIS — Z6839 Body mass index (BMI) 39.0-39.9, adult: Secondary | ICD-10-CM

## 2024-01-03 DIAGNOSIS — G40409 Other generalized epilepsy and epileptic syndromes, not intractable, without status epilepticus: Secondary | ICD-10-CM

## 2024-01-03 DIAGNOSIS — Z7985 Long-term (current) use of injectable non-insulin antidiabetic drugs: Secondary | ICD-10-CM

## 2024-01-03 MED ORDER — FOLIC ACID 1 MG PO TABS: 1.0000 mg | ORAL_TABLET | Freq: Every day | ORAL | 1 refills | Status: AC

## 2024-01-03 MED ORDER — KETOCONAZOLE 2 % EX CREA: 1.0000 | TOPICAL_CREAM | Freq: Every day | CUTANEOUS | 0 refills | Status: AC

## 2024-01-03 MED ORDER — B-12 500 MCG SL SUBL: 1.0000 | SUBLINGUAL_TABLET | Freq: Every day | SUBLINGUAL | 0 refills | Status: AC

## 2024-01-03 NOTE — Progress Notes (Signed)
 Name: Erin Good   MRN: [id number]    DOB: 1976-06-05   Date:01/03/2024       Progress Note  Subjective  Chief Complaint  Chief Complaint  Patient presents with   Abdominal Pain    Onset for months and discuss labs   HPI   LLQ pain: going on for months, constant, feels like pressure, she has also noticed a change in bowel movements, she used to have a bowel movement daily and now only once every 3-4 days, has to strain, also has blood mixed in the stools or on top of it . She will have a colonoscopy in May 2025. She has lost weight likely due to [facility] and appetite is not the same. She has a personal history of kidney stones but denies flank or back pain, no hematuria or dysuria.   Diabetes type II with obesity/dyslipidemia: she is taking Crestor but not daily, LDL not at goal. She sees Endo and is taking Mounjarno , Lantus and Metformin . She has lost a lot of weight on Mounjaro and has noticed significant moisture on abdominal fold and rash that are itchy. She asked about plastic surgery/tummy tuck -Advised to contact insurance. Denies polyphagia, polydipsia or polyuria   Grand Mal Seizures: she is compliant with Zonegran, no recent episodes. 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   Gastro-esophageal reflux disease without esophagitis 04/20/2015   NASH (nonalcoholic steatohepatitis) 04/20/2015   Allergy to nuts 04/20/2015   Calculus of kidney 04/20/2015   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   Dyslipidemia 07/02/2008   Leukocytosis  07/29/2007    Past Surgical History:  Procedure Laterality Date   BREAST BIOPSY Left 2018   benign   CESAREAN SECTION     X 2   COLONOSCOPY WITH PROPOFOL N/A 09/26/2022   Procedure: COLONOSCOPY WITH PROPOFOL;  Surgeon: Toney Reil, MD;  Location: [facility] ENDOSCOPY;  Service: Gastroenterology;  Laterality: N/A;   IRRIGATION AND DEBRIDEMENT SHOULDER Left 10/12/2020   Procedure: IRRIGATION AND DEBRIDEMENT SHOULDER;  Surgeon: Bjorn Pippin, MD;  Location: [facility] ORS;  Service: Orthopedics;  Laterality: Left;   RIGHT OOPHORECTOMY Right 2001   benign tumor   TUBAL LIGATION  2007   TUBAL LIGATION      Family History  Problem Relation Age of Onset   Diabetes Mother    Healthy Father    Cancer Maternal Grandmother    Breast cancer Paternal Grandmother 4   Cancer Paternal Grandmother    Cancer Paternal Aunt    Heart disease Neg Hx     Social History   Tobacco Use   Smoking status: Never   Smokeless tobacco: Never  Substance Use Topics   Alcohol use: Not Currently    Comment: occ     Current Outpatient Medications:    Cholecalciferol (VITAMIN D) 50 MCG (2000 UT) CAPS, Take 1 capsule by mouth daily at 12 noon., Disp: , Rfl:    clonazePAM (KLONOPIN) 0.5 MG tablet, Take 0.5 mg by mouth daily as needed., Disp: , Rfl:  EPINEPHrine 0.3 mg/0.3 mL IJ SOAJ injection, Inject 0.3 mg into the muscle as needed for anaphylaxis (for anaphylaxis). , Disp: , Rfl:    insulin glargine (LANTUS SOLOSTAR) 100 UNIT/ML Solostar Pen, INJECT SUBCUTANEOUSLY 30-35 UNITS  DAILY, Disp: , Rfl:    Insulin Pen Needle 32G X 4 MM MISC, Use 1x a day, Disp: 100 each, Rfl: 3   metFORMIN (GLUCOPHAGE-XR) 500 MG 24 hr tablet, Take 4 tablets (2,000 mg total) by mouth daily with supper., Disp: 360 tablet, Rfl: 3   MOUNJARO 15 MG/0.5ML Pen, INJECT THE CONTENTS OF ONE PEN  SUBCUTANEOUSLY WEEKLY AS  DIRECTED, Disp: 6 mL, Rfl: 3   OneTouch Delica Lancets 33G MISC, Use 2x a day with OneTouch Verio Flex, Disp: 200 each,  Rfl: 3   ONETOUCH VERIO test strip, USE 2X A DAY WITH ONETOUCH VERIO FLEX, Disp: 50 strip, Rfl: 15   rosuvastatin (CRESTOR) 20 MG tablet, Take 1 tablet (20 mg total) by mouth daily., Disp: 90 tablet, Rfl: 3   zonisamide (ZONEGRAN) 100 MG capsule, Take 400 mg by mouth at bedtime., Disp: , Rfl:    GAVILYTE-G 236 g solution, Take 4,000 mLs by mouth once. (Patient not taking: Reported on 01/03/2024), Disp: , Rfl:   Allergies  Allergen Reactions   Peanuts [Peanut Oil] Anaphylaxis   Aspirin Other (See Comments)    Does not take because of her epilepsy/seizure    Aspirin Other (See Comments)    Causes seizures   Other Other (See Comments)    Peanuts    I personally reviewed active problem list, medication list, allergies, family history with the patient/caregiver today.   ROS  Ten systems reviewed and is negative except as mentioned in HPI    Objective  Vitals:   01/03/24 1443  BP: 122/78  Pulse: (!) 101  Resp: 16  SpO2: 97%  Weight: 250 lb 6.4 oz (113.6 kg)  Height: 5\' 7"  (1.702 m)    Body mass index is 39.22 kg/m.  Physical Exam  Constitutional: Patient appears well-developed and well-nourished. Obese  No distress.  HEENT: head atraumatic, normocephalic, pupils equal and reactive to light, neck supple Cardiovascular: Normal rate, regular rhythm and normal heart sounds.  No murmur heard. No BLE edema. Pulmonary/Chest: Effort normal and breath sounds normal. No respiratory distress. Abdominal: Soft.  There is tenderness on LLQ, no guarding or rebound, pendulous abdomen Psychiatric: Patient has a normal mood and affect. behavior is normal. Judgment and thought content normal.   Recent Results (from the past 2160 hours)  POC Influenza A & B Ag (Urgent Care)     Status: Abnormal   Collection Time: 11/02/23  1:44 PM  Result Value Ref Range   Influenza A Ag Positive (A)    Influenza B Ag Negative   CBC with Differential     Status: Abnormal   Collection Time: 11/06/23  3:36  PM  Result Value Ref Range   WBC 11.8 (H) 4.0 - 10.5 K/uL   RBC 4.40 3.87 - 5.11 MIL/uL   Hemoglobin 12.0 12.0 - 15.0 g/dL   HCT 40.9 81.1 - 91.4 %   MCV 83.4 80.0 - 100.0 fL   MCH 27.3 26.0 - 34.0 pg   MCHC 32.7 30.0 - 36.0 g/dL   RDW 78.2 95.6 - 21.3 %   Platelets 278 150 - 400 K/uL   nRBC 0.0 0.0 - 0.2 %   Neutrophils Relative % 81 %   Neutro Abs 9.5 (H) 1.7 - 7.7 K/uL   Lymphocytes  Relative 14 %   Lymphs Abs 1.7 0.7 - 4.0 K/uL   Monocytes Relative 5 %   Monocytes Absolute 0.5 0.1 - 1.0 K/uL   Eosinophils Relative 0 %   Eosinophils Absolute 0.0 0.0 - 0.5 K/uL   Basophils Relative 0 %   Basophils Absolute 0.0 0.0 - 0.1 K/uL   Immature Granulocytes 0 %   Abs Immature Granulocytes 0.04 0.00 - 0.07 K/uL    Comment: Performed at [facility], [address] [place], [place] [address]  Comprehensive metabolic panel     Status: Abnormal   Collection Time: 11/06/23  3:36 PM  Result Value Ref Range   Sodium 135 135 - 145 mmol/L   Potassium 3.6 3.5 - 5.1 mmol/L   Chloride 105 98 - 111 mmol/L   CO2 16 (L) 22 - 32 mmol/L   Glucose, Bld 163 (H) 70 - 99 mg/dL    Comment: Glucose reference range applies only to samples taken after fasting for at least 8 hours.   BUN 11 6 - 20 mg/dL   Creatinine, Ser 1.47 0.44 - 1.00 mg/dL   Calcium 8.5 (L) 8.9 - 10.3 mg/dL   Total Protein 7.5 6.5 - 8.1 g/dL   Albumin 3.5 3.5 - 5.0 g/dL   AST 36 15 - 41 U/L   ALT 25 0 - 44 U/L   Alkaline Phosphatase 67 38 - 126 U/L   Total Bilirubin 0.5 0.0 - 1.2 mg/dL   GFR, Estimated >82 >95 mL/min    Comment: (NOTE) Calculated using the CKD-EPI Creatinine Equation (2021)    Anion gap 14 5 - 15    Comment: Performed at [facility], [address] [place], [place] [address]  Pregnancy, urine     Status: None   Collection Time: 11/06/23  7:44 PM  Result Value Ref Range   Preg Test, Ur NEGATIVE NEGATIVE    Comment:        THE SENSITIVITY OF THIS METHODOLOGY IS >25 mIU/mL. Performed at  [facility], [address], [place], [place] [address]   Urine Drug Screen, Qualitative [facility] only)     Status: None   Collection Time: 11/06/23  7:44 PM  Result Value Ref Range   Tricyclic, Ur Screen NONE DETECTED NONE DETECTED   Amphetamines, Ur Screen NONE DETECTED NONE DETECTED   MDMA (Ecstasy)Ur Screen NONE DETECTED NONE DETECTED   Cocaine Metabolite,Ur [place] NONE DETECTED NONE DETECTED   Opiate, Ur Screen NONE DETECTED NONE DETECTED   Phencyclidine (PCP) Ur S NONE DETECTED NONE DETECTED   Cannabinoid 50 Ng, Ur [place] NONE DETECTED NONE DETECTED   Barbiturates, Ur Screen NONE DETECTED NONE DETECTED   Benzodiazepine, Ur Scrn NONE DETECTED NONE DETECTED   Methadone Scn, Ur NONE DETECTED NONE DETECTED    Comment: (NOTE) Tricyclics + metabolites, urine    Cutoff 1000 ng/mL Amphetamines + metabolites, urine  Cutoff 1000 ng/mL MDMA (Ecstasy), urine              Cutoff 500 ng/mL Cocaine Metabolite, urine          Cutoff 300 ng/mL Opiate + metabolites, urine        Cutoff 300 ng/mL Phencyclidine (PCP), urine         Cutoff 25 ng/mL Cannabinoid, urine                 Cutoff 50 ng/mL Barbiturates + metabolites, urine  Cutoff 200 ng/mL Benzodiazepine, urine  Cutoff 200 ng/mL Methadone, urine                   Cutoff 300 ng/mL  The urine drug screen provides only a preliminary, unconfirmed analytical test result and should not be used for non-medical purposes. Clinical consideration and professional judgment should be applied to any positive drug screen result due to possible interfering substances. A more specific alternate chemical method must be used in order to obtain a confirmed analytical result. Gas chromatography / mass spectrometry (GC/MS) is the preferred confirm atory method. Performed at [facility], [address] [place], [place] [address]   HIV Antibody (routine testing w rflx)     Status: None   Collection Time: 11/06/23  7:44 PM  Result  Value Ref Range   HIV Screen 4th Generation wRfx Non Reactive Non Reactive    Comment: Performed at [facility], [address], [place], [place] [address]  Lactic acid, plasma     Status: None   Collection Time: 11/06/23  7:44 PM  Result Value Ref Range   Lactic Acid, Venous 1.0 0.5 - 1.9 mmol/L    Comment: Performed at [facility], [address] [place], [place] [address]  CBG monitoring, ED     Status: None   Collection Time: 11/06/23  9:24 PM  Result Value Ref Range   Glucose-Capillary 95 70 - 99 mg/dL    Comment: Glucose reference range applies only to samples taken after fasting for at least 8 hours.  Basic metabolic panel     Status: Abnormal   Collection Time: 11/07/23  5:35 AM  Result Value Ref Range   Sodium 135 135 - 145 mmol/L   Potassium 2.9 (L) 3.5 - 5.1 mmol/L   Chloride 108 98 - 111 mmol/L   CO2 17 (L) 22 - 32 mmol/L   Glucose, Bld 92 70 - 99 mg/dL    Comment: Glucose reference range applies only to samples taken after fasting for at least 8 hours.   BUN 7 6 - 20 mg/dL   Creatinine, Ser 2.13 0.44 - 1.00 mg/dL   Calcium 8.1 (L) 8.9 - 10.3 mg/dL   GFR, Estimated >08 >65 mL/min    Comment: (NOTE) Calculated using the CKD-EPI Creatinine Equation (2021)    Anion gap 10 5 - 15    Comment: Performed at [facility], [address] [place], [place] [address]  CBC     Status: Abnormal   Collection Time: 11/07/23  5:35 AM  Result Value Ref Range   WBC 11.2 (H) 4.0 - 10.5 K/uL   RBC 4.00 3.87 - 5.11 MIL/uL   Hemoglobin 11.3 (L) 12.0 - 15.0 g/dL   HCT 62.9 (L) 52.8 - 41.3 %   MCV 83.5 80.0 - 100.0 fL   MCH 28.3 26.0 - 34.0 pg   MCHC 33.8 30.0 - 36.0 g/dL   RDW 24.4 01.0 - 27.2 %   Platelets 275 150 - 400 K/uL   nRBC 0.0 0.0 - 0.2 %    Comment: Performed at [facility], [address] [place], [place] [address]  CBG monitoring, ED     Status: None   Collection Time: 11/07/23  7:14 AM  Result Value Ref Range    Glucose-Capillary 91 70 - 99 mg/dL    Comment: Glucose reference range applies only to samples taken after fasting for at least 8 hours.  CBC with Differential/Platelet     Status: Abnormal  Collection Time: 12/30/23  2:59 PM  Result Value Ref Range   WBC 10.1 3.8 - 10.8 Thousand/uL   RBC 4.57 3.80 - 5.10 Million/uL   Hemoglobin 12.2 11.7 - 15.5 g/dL   HCT 52.8 41.3 - 24.4 %   MCV 81.0 80.0 - 100.0 fL   MCH 26.7 (L) 27.0 - 33.0 pg   MCHC 33.0 32.0 - 36.0 g/dL    Comment: For adults, a slight decrease in the calculated MCHC value (in the range of 30 to 32 g/dL) is most likely not clinically significant; however, it should be interpreted with caution in correlation with other red cell parameters and the patient's clinical condition.    RDW 13.3 11.0 - 15.0 %   Platelets 329 140 - 400 Thousand/uL   MPV 12.0 7.5 - 12.5 fL   Neutro Abs 6,353 1,500 - 7,800 cells/uL   Absolute Lymphocytes 3,161 850 - 3,900 cells/uL   Absolute Monocytes 545 200 - 950 cells/uL   Eosinophils Absolute 10 (L) 15 - 500 cells/uL   Basophils Absolute 30 0 - 200 cells/uL   Neutrophils Relative % 62.9 %   Total Lymphocyte 31.3 %   Monocytes Relative 5.4 %   Eosinophils Relative 0.1 %   Basophils Relative 0.3 %  Iron, TIBC and Ferritin Panel     Status: None   Collection Time: 12/30/23  2:59 PM  Result Value Ref Range   Iron 68 40 - 190 mcg/dL   TIBC 010 272 - 536 mcg/dL (calc)   %SAT 21 16 - 45 % (calc)   Ferritin 34 16 - 232 ng/mL  B12 and Folate Panel     Status: Abnormal   Collection Time: 12/30/23  2:59 PM  Result Value Ref Range   Vitamin B-12 383 200 - 1,100 pg/mL    Comment: . Please Note: Although the reference range for vitamin B12 is [phone] pg/mL, it has been reported that between 5 and 10% of patients with values between 200 and 400 pg/mL may experience neuropsychiatric and hematologic abnormalities due to occult B12 deficiency; less than 1% of patients with values above 400 pg/mL will  have symptoms. .    Folate 5.1 (L) ng/mL    Comment:                            Reference Range                            Low:           <3.4                            Borderline:    3.4-5.4                            Normal:        >5.4 .   Lipid panel     Status: Abnormal   Collection Time: 12/30/23  2:59 PM  Result Value Ref Range   Cholesterol 229 (H) <200 mg/dL   HDL 47 (L) > OR = 50 mg/dL   Triglycerides 644 <034 mg/dL   LDL Cholesterol (Calc) 159 (H) mg/dL (calc)    Comment: Reference range: <100 . Desirable range <100 mg/dL for primary prevention;   <70 mg/dL for patients with CHD or diabetic patients  with > or = 2 CHD risk factors. Marland Kitchen LDL-C is now calculated using the Martin-Hopkins  calculation, which is a validated novel method providing  better accuracy than the Friedewald equation in the  estimation of LDL-C.  Horald Pollen et al. [place]ox Ahr. 4098;119(14): 2061-2068  (http://education.[facility].com/faq/FAQ164)    Total CHOL/HDL Ratio 4.9 <5.0 (calc)   Non-HDL Cholesterol (Calc) 182 (H) <130 mg/dL (calc)    Comment: For patients with diabetes plus 1 major ASCVD risk  factor, treating to a non-HDL-C goal of <100 mg/dL  (LDL-C of <78 mg/dL) is considered a therapeutic  option.   COMPLETE METABOLIC PANEL WITH GFR     Status: None   Collection Time: 12/30/23  2:59 PM  Result Value Ref Range   Glucose, Bld 87 65 - 99 mg/dL    Comment: .            Fasting reference interval .    BUN 13 7 - 25 mg/dL   Creat 2.95 6.21 - 3.08 mg/dL   eGFR 87 > OR = 60 MV/HQI/6.96E9   BUN/Creatinine Ratio SEE NOTE: 6 - 22 (calc)    Comment:    Not Reported: BUN and Creatinine are within    reference range. .    Sodium 140 135 - 146 mmol/L   Potassium 4.0 3.5 - 5.3 mmol/L   Chloride 106 98 - 110 mmol/L   CO2 27 20 - 32 mmol/L   Calcium 9.7 8.6 - 10.2 mg/dL   Total Protein 7.9 6.1 - 8.1 g/dL   Albumin 4.4 3.6 - 5.1 g/dL   Globulin 3.5 1.9 - 3.7 g/dL (calc)   AG Ratio 1.3  1.0 - 2.5 (calc)   Total Bilirubin 0.3 0.2 - 1.2 mg/dL   Alkaline phosphatase (APISO) 85 31 - 125 U/L   AST 12 10 - 35 U/L   ALT 7 6 - 29 U/L  VITAMIN D 25 Hydroxy (Vit-D Deficiency, Fractures)     Status: Abnormal   Collection Time: 12/30/23  2:59 PM  Result Value Ref Range   Vit D, 25-Hydroxy 28 (L) 30 - 100 ng/mL    Comment: Vitamin D Status         25-OH Vitamin D: . Deficiency:                    <20 ng/mL Insufficiency:             20 - 29 ng/mL Optimal:                 > or = 30 ng/mL . For 25-OH Vitamin D testing on patients on  D2-supplementation and patients for whom quantitation  of D2 and D3 fractions is required, the [facility](TM) 25-OH VIT D, (D2,D3), LC/MS/MS is recommended: order  code 52841 (patients >82yrs). . See Note 1 . Note 1 . For additional information, please refer to  http://education.[facility].com/faq/FAQ199  (This link is being provided for informational/ educational purposes only.)     Diabetic Foot Exam:     PHQ2/9:    12/30/2023    2:12 PM 01/25/2023    1:39 PM 06/26/2022    1:45 PM 12/22/2021    1:00 PM 11/29/2020   11:18 AM  Depression screen PHQ 2/9  Decreased Interest 0 0 0 0 0  Down, Depressed, Hopeless 0 0 0 0 0  PHQ - 2 Score 0 0 0 0 0  Altered sleeping 0 0 0 0 0  Tired, decreased energy 0 0 3 0  3  Change in appetite 0 0 0 0 3  Feeling bad or failure about yourself  0 0 0 0 0  Trouble concentrating 0 0 0 0 0  Moving slowly or fidgety/restless 0 0 0 0 0  Suicidal thoughts 0 0 0 0 0  PHQ-9 Score 0 0 3 0 6  Difficult doing work/chores Not difficult at all Not difficult at all   Not difficult at all    phq 9 is negative  Fall Risk:    12/30/2023    2:11 PM 05/22/2023    8:45 AM 01/25/2023    1:39 PM 06/26/2022    1:45 PM 12/22/2021    1:00 PM  Fall Risk   Falls in the past year? 0 0 0 1 1  Number falls in past yr: 0 0 0 0 0  Injury with Fall? 0 0 0 1 1  Risk for fall due to : No Fall Risks No Fall Risks  No Fall  Risks No Fall Risks  Follow up Falls prevention discussed;Education provided;Falls evaluation completed Falls evaluation completed  Falls prevention discussed Falls prevention discussed     Assessment & Plan  1. Dyslipidemia associated with type 2 diabetes mellitus (HCC) (Primary)  Under the care of Endo , discussed lipid results, needs to add zetia or injectables, she has an upcoming visit with Endo and will discuss it there. Taking Crestor 20 mg and states she has been compliant   2. Morbid obesity (HCC)  Losing weight on GLP-1 agonist  3. Left lower quadrant abdominal pain  - CT ABDOMEN PELVIS WO CONTRAST; Future  4. Folic acid deficiency  - folic acid (FOLVITE) 1 MG tablet; Take 1 tablet (1 mg total) by mouth daily.  Dispense: 100 tablet; Refill: 1  5. B12 deficiency  - Cyanocobalamin (B-12) 500 MCG SUBL; Place 1 tablet under the tongue daily at 12 noon.  Dispense: 100 tablet; Refill: 0  6. Intertrigo  - ketoconazole (NIZORAL) 2 % cream; Apply 1 Application topically daily.  Dispense: 120 g; Refill: 0   7. Pendulous abdomen  Due to weight loss, considering plastic surgery    8. 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

## 2024-01-07 ENCOUNTER — Ambulatory Visit (INDEPENDENT_AMBULATORY_CARE_PROVIDER_SITE_OTHER): Payer: 59 | Admitting: Internal Medicine

## 2024-01-07 ENCOUNTER — Encounter: Payer: Self-pay | Admitting: Internal Medicine

## 2024-01-07 ENCOUNTER — Ambulatory Visit
Admission: RE | Admit: 2024-01-07 | Discharge: 2024-01-07 | Disposition: A | Discharge status: Home / Self Care | Source: Ambulatory Visit | Attending: Internal Medicine | Admitting: Internal Medicine

## 2024-01-07 VITALS — BP 124/70 | HR 93 | Ht 67.0 in | Wt 253.2 lb

## 2024-01-07 DIAGNOSIS — E042 Nontoxic multinodular goiter: Secondary | ICD-10-CM

## 2024-01-07 DIAGNOSIS — R809 Proteinuria, unspecified: Secondary | ICD-10-CM

## 2024-01-07 DIAGNOSIS — E785 Hyperlipidemia, unspecified: Secondary | ICD-10-CM

## 2024-01-07 DIAGNOSIS — Z794 Long term (current) use of insulin: Secondary | ICD-10-CM | POA: Diagnosis not present

## 2024-01-07 DIAGNOSIS — E1129 Type 2 diabetes mellitus with other diabetic kidney complication: Secondary | ICD-10-CM | POA: Diagnosis not present

## 2024-01-07 LAB — POCT GLYCOSYLATED HEMOGLOBIN (HGB A1C): Hemoglobin A1C: 5.7 % — AB (ref 4.0–5.6)

## 2024-01-07 LAB — GLUCOSE, POCT (MANUAL RESULT ENTRY): POC Glucose: 116 mg/dL — AB (ref 70–99)

## 2024-01-07 NOTE — Progress Notes (Signed)
 Patient ID: Erin Good, female   DOB: 08-22-1976, 48 y.o.   MRN: [id number]   HPI: Erin Good is a 48 y.o.-year-old female, initially referred by her PCP, Dr. Carlynn Purl, presenting for follow-up for DM2, dx in ~2015, insulin-dependent since 2019, uncontrolled, with complications (diabetic retinopathy, microalbuminuria) and also for thyroid nodules.  Last visit 6 months ago.  Interim history: No increased urination, blurry vision, nausea, chest pain.  She lost approximately 50 pounds after starting Mounjaro and another 11 pounds before last visit.  She lost 8 more pounds since then. She had several ED visits since last visit for various problems, including a seizure on 11/06/2023.  Glucose was not low at that time.  The potassium was low, at 2.9. She had the flu A + PNA few days prev. (Temp 105.70F!!).   Reviewed HbA1c levels: Lab Results  Component Value Date   HGBA1C 5.9 (A) 07/04/2023   HGBA1C 5.8 (A) 01/03/2023   HGBA1C 6.2 (A) 09/04/2022   HGBA1C 6.5 (A) 04/04/2022   HGBA1C 7.8 (A) 12/01/2021   HGBA1C 9.7 (A) 09/01/2021   HGBA1C 7.6 (H) 10/03/2020   HGBA1C 7.9 (A) 08/15/2020   HGBA1C 9.0 (H) 06/30/2020   HGBA1C 8.3 (A) 05/30/2020   Pt was on a regimen of: - Metformin ER 750 mg 2x a day, with meals - Soliqua (Lantus + Lixisenatide) 50 units daily in am  Currently on: - Metformin ER 750 mg 2x a day with meals >> 1500 >> 2000 mg with dinner - Lantus 50 >> 56 units at bedtime - occas. Skipping 2/2 lows >> 40 >> 30 units daily - >> Mounjaro 7.5 >> 10 >> 12.5 >> 15 mg weekly  Tried Farxiga 06/2020 >> not covered.  She checks her sugars 1-2 times a day-no log, no meter: - am:  110, 120-130s, 160 >> 110-130 >> 87-110 >> 90-110, 120 >> 100-110 - 2h after b'fast:85-130 >> n/c >> 166, 207 >> n/c >> 150-158 >> n/c - before lunch:  92-180s >> n/c >> 120-140 >> 110-140, 150 >> n/c  - 2h after lunch: 160s-210 >> 125-153 >> n/c >> 150s >> 140s >> <140 - before dinner: 70s,  120-140 >> 110-140, 150 >> 60 (flu) >> n/c - 2h after dinner: n/c >> 130-137, 180 >> <140, 160 - bedtime: 1 170-330 >> N/c >> 76 if skips the meal >> n/c - nighttime: n/c Lowest sugar was 760 >> 90 >> 100; she has hypoglycemia awareness at 100. Highest sugar was 330 >> .Marland Kitchen. 158 >> 180 (Covid) >> 160  Glucometer: [facility] advance  Pt's meals are: - Breakfast: 2 eggs + 3 pieces of bacon >> 2 eggs - snack: fruit - Lunch: salad - Dinner:meat + veggie + starch (rice + pasta) - Snacks: 1   -No CKD, last BUN/creatinine:  Lab Results  Component Value Date   BUN 13 12/30/2023   BUN 7 11/07/2023   CREATININE 0.84 12/30/2023   CREATININE 0.62 11/07/2023  Not on ACE inhibitor/ARB.  ACR levels are high: Lab Results  Component Value Date   MICRALBCREAT 1.5 07/04/2023   MICRALBCREAT 1.6 12/01/2021   MICRALBCREAT 3.9 10/27/2020   MICRALBCREAT 33 (H) 11/12/2019   MICRALBCREAT 30 (H) 01/29/2019   -+ HL; last set of lipids: Lab Results  Component Value Date   CHOL 229 (H) 12/30/2023   HDL 47 (L) 12/30/2023   LDLCALC 159 (H) 12/30/2023   TRIG 112 12/30/2023   CHOLHDL 4.9 12/30/2023  Previously on Crestor 10 mg daily  and I advised her to increase the dose to 20 mg daily. She takes this now.  - last eye exam was in 2024: ? DR (need to get the report).  - no numbness and tingling in her feet.  Occasional numbness in the right leg and foot.  Foot Exam 07/04/2023.  Pt has FH of DM in mother, MGF.  Thyroid nodules:  Reviewed previous work-up: 06/01/2019: Thyroid ultrasound: Parenchymal Echotexture: Mildly heterogenous Isthmus: 0.4 cm thickness Right lobe: 5 x 2.4 x 2.8 cm Left lobe: 4.5 x 2 x 2.1 cm    Estimated total number of nodules >/= 1 cm: 4   Nodule # 1: Location: Right; Mid Maximum size: 1.6 cm; Other 2 dimensions: 1.5 x 1.4 cm Composition: mixed cystic and solid (1) Echogenicity: hypoechoic (2) *Given size (>/= 1.5 - 2.4 cm) and appearance, a follow-up ultrasound in 1 year  should be considered based on TI-RADS criteria. _________________________________________________________   Nodule # 2: Location: Right; Inferior Maximum size: 2.3 cm; Other 2 dimensions: 1.9 x 1.9 cm Composition: solid/almost completely solid (2) Echogenicity: hypoechoic (2)  **Given size (>/= 1.5 cm) and appearance, fine needle aspiration of this moderately suspicious nodule should be considered based on TI-RADS criteria. _________________________________________________   Nodule # 3: Location: Left; Inferior Maximum size: 1.4 cm; Other 2 dimensions: 1.2 x 1.2 cm Composition: solid/almost completely solid (2) Echogenicity: isoechoic (1)  Given size (<1.4 cm) and appearance, this nodule does NOT meet TI-RADS criteria for biopsy or dedicated follow-up. _________________________________________________________   Nodule # 4: Location: Left; Mid Maximum size: 1.8 cm; Other 2 dimensions: 1.6 x 1.3 cm Composition: solid/almost completely solid (2) Echogenicity: hypoechoic (2) **Given size (>/= 1.5 cm) and appearance, fine needle aspiration of this moderately suspicious nodule should be considered based on TI-RADS criteria.   IMPRESSION: 1. Thyromegaly with bilateral nodules. 2. Recommend FNA biopsy of moderately suspicious 1.8 cm mid left AND 2.3 cm inferior right nodules. 3. Recommend annual/biennial ultrasound follow-up of additional nodules as above, until stability x5 years confirmed.  08/05/2019: FNA of the 2 nodules: Clinical History: Right inferior 2.3cm; Other 2 dimensions: 1.9 x 1.9cm,  Solid / almost completely solid, Hypoechoic, TI-RADS total points 4  Specimen Submitted:  A. THYROID, RLP, FINE NEEDLE ASPIRATION:  DIAGNOSIS:  - Consistent with benign follicular nodule (Bethesda category II)  SPECIMEN ADEQUACY:  Satisfactory for evaluation   Clinical History: Left mid 1.8cm; Other 2 dimensions: 1.6 x 1.3cm, Solid  / almost completely solid, Hypoechoic, TI-RADS  total points 4  Specimen Submitted:  A. THYROID, LMP, FINE NEEDLE  ASPIRATION:  DIAGNOSIS:  - Consistent with benign follicular nodule (Bethesda category II)  SPECIMEN ADEQUACY:  Satisfactory for evaluation  Thyroid U/S (09/18/2021): Parenchymal Echotexture: Mildly heterogenous Isthmus: 0.5 cm, previously 0.4 cm Right lobe: 6.1 x 2.2 x 2.8 cm, previously 5.0 x 2.4 x 2.8 cm Left lobe: 5.3 x 2.3 x 2.4 cm, previously 4.5 x 2.0 x 2.1 cm _________________________________________________________   Estimated total number of nodules >/= 1 cm: 5 _________________________________________________________   Nodule # 1: Location: Right; Superior Maximum size: 2.0 cm; Other 2 dimensions: 1.7 x 1.7 cm, previously 1.6 x 1.5 x 1.4 cm Composition: mixed cystic and solid (1) Echogenicity: isoechoic (1) This nodule does NOT meet TI-RADS criteria for biopsy or dedicated follow-up. _________________________________________________________   Nodule # 2: Location: Right; Mid Maximum size: 2.9 cm; Other 2 dimensions: 2.2 x 1.7 cm, previously 2.3 x 1.9 x 1.9 cm This nodule is mixed cystic and solid, and was  previously biopsied.  _________________________________________________________   Nodule # 3: Location: Right; Inferior Maximum size: 1.5 cm; Other 2 dimensions: 1.4 x 0.9 cm Composition: solid/almost completely solid (2) Echogenicity: isoechoic (1) *Given size (>/= 1.5 - 2.4 cm) and appearance, a follow-up ultrasound in 1 year should be considered based on TI-RADS criteria. _________________________________________________________   Nodule # 4: Location: Left; Mid Maximum size: 2.5 cm; Other 2 dimensions: 2.0 x 1.5 cm, previously 1.8 x 1.6 x 1.2 cm This nodule is mixed cystic and solid, and was previously biopsied. _________________________________________________________   Nodule # 5: Location: Left; Inferior Maximum size: 1.5 cm; Other 2 dimensions: 1.2 x 1.0 cm, previously 1.4 x 1.2 x  1.2 cm Composition: solid/almost completely solid (2) Echogenicity: isoechoic (1) *Given size (>/= 1.5 - 2.4 cm) and appearance, a follow-up ultrasound in 1 year should be considered based on TI-RADS criteria.  _________________________________________________________   No cervical lymphadenopathy, within the imaged neck   IMPRESSION: 1. Multinodular thyroid gland, with similar appearance of previously-biopsied nodules as above. 2. 1.5 cm bilateral TR 3 nodules. Attention on follow-up.  *Given size (>/= 1.5 - 2.4 cm) and appearance, a follow-up ultrasound in 1 year should be considered based on TI-RADS criteria.  Thyroid U/S (09/06/2022): Parenchymal Echotexture: Mildly heterogenous  Isthmus: 0.4 cm  Right lobe: 6.0 x 2.3 x 3.1 cm  Left lobe: 5.1 x 2.3 x 2.4 cm  _________________________________________________________   Estimated total number of nodules >/= 1 cm: 5 _________________________________________________________   Nodule # 1:  Location: RIGHT; Superior  Maximum size: 2.1 cm; Other 2 dimensions: 1.9 x 2.5 cm, previously 2.0 x 1.7 x 1.7 cm  Composition: mixed cystic and solid (1) Echogenicity: isoechoic (1)  This nodule does NOT meet TI-RADS criteria for biopsy or dedicated follow-up.  _________________________________________________________   Nodule # 2:  Location: RIGHT; Mid/inferior  Maximum size: 3.1 cm; Other 2 dimensions: 2.5 x 1.9 cm  No aggressive features on today's evaluation. This nodule appears morphologically stable for and was previously biopsied in 08/05/2019. Assuming a benign pathologic diagnosis, repeat sampling and/or dedicated follow-up is not recommended.  _________________________________________________________   Nodule # 3:  Location: RIGHT; Inferior  Maximum size: 1.5 cm; Other 2 dimensions: 1.4 x 1.3 cm, previously 1.5 x 1.4 x 0.9 cm  Composition: solid/almost completely solid (2)  Echogenicity: isoechoic (1) *Given size (>/= 1.5 -  2.4 cm) and appearance, a follow-up ultrasound in 1 year should be considered based on TI-RADS criteria.  _________________________________________________________   Nodule # 4:  Location: LEFT; Mid  Maximum size: 2.6 cm; Other 2 dimensions: 2.2 x 2.2 cm, previously 2.5 x 2.0 x 1.5 cm  No aggressive features on today's evaluation. This nodule appears morphologically stable and was previously biopsied in 08/05/2019. Assuming a benign pathologic diagnosis, repeat sampling and/or dedicated follow-up is not recommended.  _________________________________________________________   Nodule # 5:  Location: LEFT; Inferior  Maximum size: 1.7 cm; Other 2 dimensions: 1.6 x 1.1 cm, previously 1.5 x 1.2 x 1.0 cm  Composition: solid/almost completely solid (2)  Echogenicity: isoechoic (1) *Given size (>/= 1.5 - 2.4 cm) and appearance, a follow-up ultrasound in 1 year should be considered based on TI-RADS criteria.  _________________________________________________________   No cervical adenopathy or abnormal fluid collection within the imaged neck.   IMPRESSION: 1. Multinodular thyroid gland. 2. 1.5 cm bilateral inferior TR-3 thyroid nodules. A follow-up ultrasound in 1 year should be considered based on TI-RADS criteria. 3. Additional smaller and previously-biopsied nodules, as described above, without follow-up nor biopsy  indicated per current criteria.  Reviewed her TFTs: Lab Results  Component Value Date   TSH 1.05 12/01/2021   TSH 1.74 10/27/2020   TSH 1.35 05/25/2019   TSH 1.070 07/28/2015   TSH 1.13 10/15/2014   Pt denies: - feeling nodules in neck - hoarseness - dysphagia - choking  She also has a history of PCOS and distant history of epilepsy at 48 years old.  ROS: Signs see HPI  I reviewed pt's medications, allergies, PMH, social hx, family hx, and changes were documented in the history of present illness. Otherwise, unchanged from my initial visit note.  Past  Medical History:  Diagnosis Date   Allergic rhinitis    Breast discharge 06/05/2017   2 weeks ago left   Breast mass 12/06/2016   left   COVID-19 virus infection 11/2020   Diabetes mellitus without complication (HCC)    Dyslipidemia    Epilepsy (HCC)    Febrile seizures (HCC)    Galactorrhea    Hx gestational diabetes    Hypertension    Kidney stones    Morbid obesity (HCC)    Morbid obesity with BMI of 40.0-44.9, adult (HCC)    Obesity    Seizures (HCC)    Sleep apnea    Syncope and collapse    Tachycardia    Past Surgical History:  Procedure Laterality Date   BREAST BIOPSY Left 2018   benign   CESAREAN SECTION     X 2   COLONOSCOPY WITH PROPOFOL N/A 09/26/2022   Procedure: COLONOSCOPY WITH PROPOFOL;  Surgeon: Toney Reil, MD;  Location: [facility];  Service: Gastroenterology;  Laterality: N/A;   IRRIGATION AND DEBRIDEMENT SHOULDER Left 10/12/2020   Procedure: IRRIGATION AND DEBRIDEMENT SHOULDER;  Surgeon: Bjorn Pippin, MD;  Location: [facility];  Service: Orthopedics;  Laterality: Left;   RIGHT OOPHORECTOMY Right 2001   benign tumor   TUBAL LIGATION  2007   Social History   Socioeconomic History   Marital status: Married    Spouse name: Christiane Ha   Number of children: 2   Years of education: [employer] education level: Master's degree (e.g., MA, MS, MEng, MEd, MSW, MBA)  Occupational History   Occupation: accountant  Tobacco Use   Smoking status: Never   Smokeless tobacco: Never  Vaping Use   Vaping status: Never Used  Substance and Sexual Activity   Alcohol use: Not Currently    Comment: occ   Drug use: Not Currently   Sexual activity: Yes    Partners: Male    Birth control/protection: Surgical    Comment: tubial lig  Other Topics Concern   Not on file  Social History Narrative   ** Merged History Encounter **       Social Drivers of Health   Financial Resource Strain: Low Risk  (12/30/2023)   Overall Financial Resource Strain  (CARDIA)    Difficulty of Paying Living Expenses: Not hard at all  Food Insecurity: No Food Insecurity (12/30/2023)   Hunger Vital Sign    Worried About Running Out of Food in the Last Year: Never true    Ran Out of Food in the Last Year: Never true  Transportation Needs: No Transportation Needs (12/30/2023)   PRAPARE - Administrator, Civil Service (Medical): No    Lack of Transportation (Non-Medical): No  Physical Activity: Sufficiently Active (12/30/2023)   Exercise Vital Sign    Days of Exercise per Week: 2 days    Minutes of  Exercise per Session: 90 min  Stress: No Stress Concern Present (12/30/2023)   [employer] of Occupational Health - Occupational Stress Questionnaire    Feeling of Stress : Only a little  Social Connections: Unknown (12/30/2023)   Social Connection and Isolation Panel [NHANES]    Frequency of Communication with Friends and Family: Three times a week    Frequency of Social Gatherings with Friends and Family: Twice a week    Attends Religious Services: More than 4 times per year    Active Member of [employer] or Organizations: Yes    Attends Engineer, structural: More than 4 times per year    Marital Status: Patient declined  Intimate Partner Violence: Not At Risk (12/30/2023)   Humiliation, Afraid, Rape, and Kick questionnaire    Fear of Current or Ex-Partner: No    Emotionally Abused: No    Physically Abused: No    Sexually Abused: No   Meds: Current Outpatient Medications on File Prior to Visit  Medication Sig Dispense Refill   Cholecalciferol (VITAMIN D) 50 MCG (2000 UT) CAPS Take 1 capsule by mouth daily at 12 noon.     clonazePAM (KLONOPIN) 0.5 MG tablet Take 0.5 mg by mouth daily as needed.     Cyanocobalamin (B-12) 500 MCG SUBL Place 1 tablet under the tongue daily at 12 noon. 100 tablet 0   EPINEPHrine 0.3 mg/0.3 mL IJ SOAJ injection Inject 0.3 mg into the muscle as needed for anaphylaxis (for anaphylaxis).      folic acid  (FOLVITE) 1 MG tablet Take 1 tablet (1 mg total) by mouth daily. 100 tablet 1   GAVILYTE-G 236 g solution Take 4,000 mLs by mouth once. (Patient not taking: Reported on 01/03/2024)     insulin glargine (LANTUS SOLOSTAR) 100 UNIT/ML Solostar Pen INJECT SUBCUTANEOUSLY 30-35 UNITS  DAILY     Insulin Pen Needle 32G X 4 MM MISC Use 1x a day 100 each 3   ketoconazole (NIZORAL) 2 % cream Apply 1 Application topically daily. 120 g 0   metFORMIN (GLUCOPHAGE-XR) 500 MG 24 hr tablet Take 4 tablets (2,000 mg total) by mouth daily with supper. 360 tablet 3   MOUNJARO 15 MG/0.5ML Pen INJECT THE CONTENTS OF ONE PEN  SUBCUTANEOUSLY WEEKLY AS  DIRECTED 6 mL 3   OneTouch Delica Lancets 33G MISC Use 2x a day with OneTouch Verio Flex 200 each 3   ONETOUCH VERIO test strip USE 2X A DAY WITH ONETOUCH VERIO FLEX 50 strip 15   rosuvastatin (CRESTOR) 20 MG tablet Take 1 tablet (20 mg total) by mouth daily. 90 tablet 3   zonisamide (ZONEGRAN) 100 MG capsule Take 400 mg by mouth at bedtime.     No current facility-administered medications on file prior to visit.   Allergies  Allergen Reactions   Peanuts [Peanut Oil] Anaphylaxis   Aspirin Other (See Comments)    Does not take because of her epilepsy/seizure    Aspirin Other (See Comments)    Causes seizures   Other Other (See Comments)    Peanuts   Family History  Problem Relation Age of Onset   Diabetes Mother    Healthy Father    Cancer Maternal Grandmother    Breast cancer Paternal Grandmother 67   Cancer Paternal Grandmother    Cancer Paternal Aunt    Heart disease Neg Hx    PE: BP 124/70   Pulse 93   Ht 5\' 7"  (1.702 m)   Wt 253 lb 3.2 oz (114.9  kg)   SpO2 98%   BMI 39.66 kg/m  Wt Readings from Last 10 Encounters:  01/07/24 253 lb 3.2 oz (114.9 kg)  01/03/24 250 lb 6.4 oz (113.6 kg)  12/30/23 251 lb 11.2 oz (114.2 kg)  11/06/23 264 lb (119.7 kg)  07/04/23 261 lb 3.2 oz (118.5 kg)  05/22/23 261 lb 8 oz (118.6 kg)  01/25/23 262 lb 9.6 oz (119.1  kg)  01/21/23 268 lb (121.6 kg)  01/03/23 272 lb 6.4 oz (123.6 kg)  12/16/22 270 lb (122.5 kg)   Constitutional: overweight, in NAD Eyes: EOMI, no exophthalmos ENT: + B thyromegaly, no cervical lymphadenopathy Cardiovascular: RRR, No MRG Respiratory: CTA B Musculoskeletal: no deformities Skin: no rashes Neurological: no tremor with outstretched hands  ASSESSMENT: 1. DM2, insulin-dependent, uncontrolled, with complications - DR - MAU  2.  Thyroid nodules  3. HL  PLAN:  1. Patient with longstanding, previously uncontrolled type 2 diabetes, on long-acting insulin, metformin, and GLP-1/GIP receptor agonist, with significantly improvement in control after starting Mounjaro.  At last visit, HbA1c was 5.9%, excellent.  Sugars remained at goal, only higher when she had COVID infection the month prior to our last visit.  She was able to decrease the dose of Lantus since the previous visit and we continued this. -At today's visit, sugars are excellent.  We can try to reduce the Lantus dose more.  We can continue metformin and Mounjaro. - I suggested to:  Patient Instructions  Please continue: - Metformin ER 2000 mg with dinner - Mounjaro 15 mg weekly  Try to decrease: - Lantus 25 units at bedtime  Let's check another thyroid U/S.  Please return in 4-6 months with your sugar log.   - we checked her HbA1c: 5.7% (best in many years) - advised to check sugars at different times of the day - 1x a day, rotating check times - advised for yearly eye exams >> she is UTD - return to clinic in 4-6 months  2.  Thyroid nodules -No neck compression symptoms -TSH level was normal: Lab Results  Component Value Date   TSH 1.05 12/01/2021  - 2 of the nodules were biopsied in 07/2019 with benign results -The thyroid ultrasound reports from 09/2021 in 09/2022 showed stable nodules.  The dominant 2 nodules did need to follow-up in a year. -Will check another ultrasound now  3. HL -Latest  lipid panel was reviewed from 12/2023: Elevated LDL, low HDL: Lab Results  Component Value Date   CHOL 229 (H) 12/30/2023   HDL 47 (L) 12/30/2023   LDLCALC 159 (H) 12/30/2023   TRIG 112 12/30/2023   CHOLHDL 4.9 12/30/2023  -She is on Crestor 20 mg daily without side effects  Orders Placed This Encounter  Procedures   US THYROID    Thyroid U/S (01/07/2024): Parenchymal Echotexture: Moderately heterogeneous  Isthmus: 0.5 cm ,previously 0.46 cm  Right lobe: 6.2 x 3.1 x 2.9 cm ,previously 6.0 x 2 point cm  Left lobe: 5.1 x 2.1 x 2.1 cm ,previously 5.1 x 2.3 x 2.4 cm  ________________________________________________________   Estimated total number of nodules >/= 1 cm: 5 _________________________________________________________   Slight interval enlargement however persistent benign appearance of solid cystic nodule in the right superior thyroid (labeled 1, 2.6 cm, previously 2.1 cm).   Similar appearance of previously biopsied solid cystic nodule in the right mid thyroid (labeled 2, 2.4 cm, previously 3.1 cm).   Nodule # 3:  Prior biopsy: No  Location: Right; Inferior  Maximum size:  1.3 cm; Other 2 dimensions: 1.0 x 1.0 cm, previously, 1.5 x 1.3 x 1.4 cm  Composition: spongiform (0)  Echogenicity: isoechoic (1) Change in features: Yes This nodule does NOT meet TI-RADS criteria for biopsy or dedicated follow-up.  _________________________________________________________   There is subcentimeter pseudo nodularity about the left superior thyroid which does not require follow-up.   Similar appearance of previously biopsied solid cystic nodule in the left mid thyroid (labeled 5, 2.5 cm, previously labeled 4, 2.6 cm).   Ill-defined spongiform like pseudo nodularity about the posterior left mid thyroid which is not require additional follow-up.   Nodule # 6, previously labeled 5:  Prior biopsy: No  Location: Left; Inferior   Maximum size: 1.3 cm; Other 2 dimensions: 1.1 x  1.0 cm, previously, 1.7 x 1.6 x 1.1 cm  Composition: solid/almost completely solid (2)  Echogenicity: isoechoic (1) Given size (<1.4 cm) and appearance, this nodule does NOT meet TI-RADS criteria for biopsy or dedicated follow-up.  _________________________________________________________   No cervical lymphadenopathy.   IMPRESSION: 1. Similar appearing multinodular thyroid. 2. Similar appearance of previously biopsied right mid (labeled 2, 2.4 cm, previously 3.1 cm) and left mid (labeled 5, 2.5 cm, previously labeled 4 2.6 cm). Recommend correlation with prior biopsy results. 3. Previously visualized right inferior thyroid nodule (labeled 3, 1.3 cm, previously 1.5 cm) and left inferior (labeled 6, 1.3 cm, previously labeled 5, 1.7 cm) now demonstrate benign characteristics (TI-RADS category 1 and TI-RADS category 3, respectively) and do not warrant additional follow-up.   The right superior  thyroid nodule is slightly larger, possibly due to cystic component.  The rest of the nodules appear to be stable or decreased in size.  Carlus Pavlov, MD PhD [facility] Endocrinology

## 2024-01-07 NOTE — Patient Instructions (Addendum)
 Please continue: - Metformin ER 2000 mg with dinner - Mounjaro 15 mg weekly  Try to decrease: - Lantus 25 units at bedtime  Let's check another thyroid U/S.  Please return in 4-6 months with your sugar log.

## 2024-01-07 NOTE — Addendum Note (Signed)
 Addended by: Pollie Meyer on: 01/07/2024 08:23 AM   Modules accepted: Orders

## 2024-01-09 ENCOUNTER — Encounter: Payer: Self-pay | Admitting: Internal Medicine

## 2024-01-29 ENCOUNTER — Encounter: Payer: Self-pay | Admitting: Obstetrics and Gynecology

## 2024-01-29 ENCOUNTER — Ambulatory Visit (INDEPENDENT_AMBULATORY_CARE_PROVIDER_SITE_OTHER): Payer: 59 | Admitting: Obstetrics and Gynecology

## 2024-01-29 VITALS — BP 139/81 | HR 92 | Ht 67.0 in | Wt 255.7 lb

## 2024-01-29 DIAGNOSIS — N951 Menopausal and female climacteric states: Secondary | ICD-10-CM

## 2024-01-29 DIAGNOSIS — R102 Pelvic and perineal pain: Secondary | ICD-10-CM

## 2024-01-29 DIAGNOSIS — N921 Excessive and frequent menstruation with irregular cycle: Secondary | ICD-10-CM | POA: Insufficient documentation

## 2024-01-29 DIAGNOSIS — E119 Type 2 diabetes mellitus without complications: Secondary | ICD-10-CM | POA: Insufficient documentation

## 2024-01-29 DIAGNOSIS — Z01419 Encounter for gynecological examination (general) (routine) without abnormal findings: Secondary | ICD-10-CM

## 2024-01-29 DIAGNOSIS — Z124 Encounter for screening for malignant neoplasm of cervix: Secondary | ICD-10-CM

## 2024-01-29 DIAGNOSIS — R928 Other abnormal and inconclusive findings on diagnostic imaging of breast: Secondary | ICD-10-CM | POA: Insufficient documentation

## 2024-01-29 DIAGNOSIS — Z1231 Encounter for screening mammogram for malignant neoplasm of breast: Secondary | ICD-10-CM

## 2024-01-29 DIAGNOSIS — Z8742 Personal history of other diseases of the female genital tract: Secondary | ICD-10-CM | POA: Insufficient documentation

## 2024-01-29 NOTE — Progress Notes (Unsigned)
 GYNECOLOGY ANNUAL PHYSICAL EXAM PROGRESS NOTE  Subjective:    Erin Good is a 48 y.o. [phone] female who presents for an annual exam.  The patient is sexually active. The patient participates in regular exercise: yes. Has the patient ever been transfused or tattooed?: yes. The patient reports that there is not domestic violence in her life.   The patient has the following complaints today: Has not had a cycle since December.  Also has been experiencing abdominal pain for several months, sharp sometimes. Feel is like her cycle wants to come on but does not.  Does not take anything for the pain.   Menstrual History: Menarche age: 19 Patient's last menstrual period was 10/26/2023 (exact date). Period Duration (Days): 3-4 Period Pattern: (!) Irregular Menstrual Flow: Moderate Menstrual Control: Maxi pad Menstrual Control Change Freq (Hours): 1 Dysmenorrhea: (!) Moderate Dysmenorrhea Symptoms: Cramping, Nausea, Other (Comment) (Clots)   Gynecologic History:  Contraception: tubal ligation History of STI's: Denies Last Pap: 12/29/21. Results were: normal. Notes remote h/o abnormal pap smears. Last mammogram: 07/23/22. Results were: abnormal, possible asymmetry of left breast. Diagnostic mammo of left breast normal.    OB History  Gravida Para Term Preterm AB Living  2 2 0 2 0 2  SAB IAB Ectopic Multiple Live Births  0 0 0 0 2    # Outcome Date GA Lbr Len/2nd Weight Sex Type Anes PTL Lv  2 Preterm 08/16/06    F CS-Unspec   LIV  1 Preterm 07/03/03    M CS-Unspec   LIV    Obstetric Comments  Menstrual age: 34    Age 1st Pregnancy: 72     Past Medical History:  Diagnosis Date   Allergic rhinitis    Breast discharge 06/05/2017   2 weeks ago left   Breast mass 12/06/2016   left   COVID-19 virus infection 11/2020   Diabetes mellitus without complication (HCC)    Dyslipidemia    Epilepsy (HCC)    Febrile seizures (HCC)    Galactorrhea    Hx gestational  diabetes    Hypertension    Kidney stones    Morbid obesity (HCC)    Morbid obesity with BMI of 40.0-44.9, adult (HCC)    Obesity    Seizures (HCC)    Sleep apnea    Syncope and collapse    Tachycardia     Past Surgical History:  Procedure Laterality Date   BREAST BIOPSY Left 2018   benign   CESAREAN SECTION     X 2   COLONOSCOPY WITH PROPOFOL N/A 09/26/2022   Procedure: COLONOSCOPY WITH PROPOFOL;  Surgeon: Toney Reil, MD;  Location: [facility];  Service: Gastroenterology;  Laterality: N/A;   IRRIGATION AND DEBRIDEMENT SHOULDER Left 10/12/2020   Procedure: IRRIGATION AND DEBRIDEMENT SHOULDER;  Surgeon: Bjorn Pippin, MD;  Location: [facility];  Service: Orthopedics;  Laterality: Left;   RIGHT OOPHORECTOMY Right 2001   benign tumor   TUBAL LIGATION  2007    Family History  Problem Relation Age of Onset   Diabetes Mother    Healthy Father    Cancer Maternal Grandmother    Breast cancer Paternal Grandmother 38   Cancer Paternal Grandmother    Cancer Paternal Aunt    Heart disease Neg Hx     Social History   Socioeconomic History   Marital status: Married    Spouse name: Christiane Ha   Number of children: 2   Years of education: [employer]  Highest education level: Master's degree (e.g., MA, MS, MEng, MEd, MSW, MBA)  Occupational History   Occupation: accountant  Tobacco Use   Smoking status: Never   Smokeless tobacco: Never  Vaping Use   Vaping status: Never Used  Substance and Sexual Activity   Alcohol use: Not Currently    Comment: occ   Drug use: Not Currently   Sexual activity: Yes    Partners: Male    Birth control/protection: Surgical    Comment: tubial lig  Other Topics Concern   Not on file  Social History Narrative   ** Merged History Encounter **       Social Drivers of Health   Financial Resource Strain: Low Risk  (12/30/2023)   Overall Financial Resource Strain (CARDIA)    Difficulty of Paying Living Expenses: Not hard at all  Food  Insecurity: No Food Insecurity (12/30/2023)   Hunger Vital Sign    Worried About Running Out of Food in the Last Year: Never true    Ran Out of Food in the Last Year: Never true  Transportation Needs: No Transportation Needs (12/30/2023)   PRAPARE - Administrator, Civil Service (Medical): No    Lack of Transportation (Non-Medical): No  Physical Activity: Sufficiently Active (12/30/2023)   Exercise Vital Sign    Days of Exercise per Week: 2 days    Minutes of Exercise per Session: 90 min  Stress: No Stress Concern Present (12/30/2023)   [employer] of Occupational Health - Occupational Stress Questionnaire    Feeling of Stress : Only a little  Social Connections: Unknown (12/30/2023)   Social Connection and Isolation Panel [NHANES]    Frequency of Communication with Friends and Family: Three times a week    Frequency of Social Gatherings with Friends and Family: Twice a week    Attends Religious Services: More than 4 times per year    Active Member of [employer] or Organizations: Yes    Attends Engineer, structural: More than 4 times per year    Marital Status: Patient declined  Intimate Partner Violence: Not At Risk (12/30/2023)   Humiliation, Afraid, Rape, and Kick questionnaire    Fear of Current or Ex-Partner: No    Emotionally Abused: No    Physically Abused: No    Sexually Abused: No    Current Outpatient Medications on File Prior to Visit  Medication Sig Dispense Refill   Cholecalciferol (VITAMIN D) 50 MCG (2000 UT) CAPS Take 1 capsule by mouth daily at 12 noon.     clonazePAM (KLONOPIN) 0.5 MG tablet Take 0.5 mg by mouth daily as needed.     Cyanocobalamin (B-12) 500 MCG SUBL Place 1 tablet under the tongue daily at 12 noon. 100 tablet 0   EPINEPHrine 0.3 mg/0.3 mL IJ SOAJ injection Inject 0.3 mg into the muscle as needed for anaphylaxis (for anaphylaxis).      folic acid (FOLVITE) 1 MG tablet Take 1 tablet (1 mg total) by mouth daily. 100 tablet 1    GAVILYTE-G 236 g solution Take 4,000 mLs by mouth once.     insulin glargine (LANTUS SOLOSTAR) 100 UNIT/ML Solostar Pen INJECT SUBCUTANEOUSLY 30-35 UNITS  DAILY     Insulin Pen Needle 32G X 4 MM MISC Use 1x a day 100 each 3   ketoconazole (NIZORAL) 2 % cream Apply 1 Application topically daily. 120 g 0   metFORMIN (GLUCOPHAGE-XR) 500 MG 24 hr tablet Take 4 tablets (2,000 mg total) by mouth daily with  supper. 360 tablet 3   MOUNJARO 15 MG/0.5ML Pen INJECT THE CONTENTS OF ONE PEN  SUBCUTANEOUSLY WEEKLY AS  DIRECTED 6 mL 3   OneTouch Delica Lancets 33G MISC Use 2x a day with OneTouch Verio Flex 200 each 3   ONETOUCH VERIO test strip USE 2X A DAY WITH ONETOUCH VERIO FLEX 50 strip 15   rosuvastatin (CRESTOR) 20 MG tablet Take 1 tablet (20 mg total) by mouth daily. 90 tablet 3   zonisamide (ZONEGRAN) 100 MG capsule Take 400 mg by mouth at bedtime.     No current facility-administered medications on file prior to visit.    Allergies  Allergen Reactions   Peanuts [Peanut Oil] Anaphylaxis   Aspirin Other (See Comments)    Does not take because of her epilepsy/seizure    Aspirin Other (See Comments)    Causes seizures   Other Other (See Comments)    Peanuts     Review of Systems Constitutional: negative for chills, fatigue, fevers and sweats Eyes: negative for irritation, redness and visual disturbance Ears, nose, mouth, throat, and face: negative for hearing loss, nasal congestion, snoring and tinnitus Respiratory: negative for asthma, cough, sputum Cardiovascular: negative for chest pain, dyspnea, exertional chest pressure/discomfort, irregular heart beat, palpitations and syncope Gastrointestinal: negative for abdominal pain, change in bowel habits, nausea and vomiting Genitourinary: positive for abnormal menstrual periods (missed cycles) and pelvic pain. Negative for genital lesions, sexual problems and vaginal discharge, dysuria and urinary incontinence Integument/breast: negative for  breast lump, breast tenderness and nipple discharge Hematologic/lymphatic: negative for bleeding and easy bruising Musculoskeletal:negative for back pain and muscle weakness Neurological: negative for dizziness, headaches, vertigo and weakness Endocrine: negative for diabetic symptoms including polydipsia, polyuria and skin dryness Allergic/Immunologic: negative for hay fever and urticaria      Objective:  Blood pressure 139/81, pulse 92, height 5\' 7"  (1.702 m), weight 255 lb 11.2 oz (116 kg), last menstrual period 10/26/2023. Body mass index is 40.05 kg/m.    General Appearance:    Alert, cooperative, no distress, appears stated age, morbid obesity  Head:    Normocephalic, without obvious abnormality, atraumatic  Eyes:    PERRL, conjunctiva/corneas clear, EOM's intact, both eyes  Ears:    Normal external ear canals, both ears  Nose:   Nares normal, septum midline, mucosa normal, no drainage or sinus tenderness  Throat:   Lips, mucosa, and tongue normal; teeth and gums normal  Neck:   Supple, symmetrical, trachea midline, no adenopathy; thyroid: no enlargement/tenderness/nodules; no carotid bruit or JVD  Back:     Symmetric, no curvature, ROM normal, no CVA tenderness  Lungs:     Clear to auscultation bilaterally, respirations unlabored  Chest Wall:    No tenderness or deformity   Heart:    Regular rate and rhythm, S1 and S2 normal, no murmur, rub or gallop  Breast Exam:    No tenderness, masses, or nipple abnormality  Abdomen:     Soft, non-tender, bowel sounds active all four quadrants, no masses, no organomegaly.    Genitalia:    Pelvic:external genitalia normal, vagina without lesions, discharge, or tenderness, rectovaginal septum  normal. Cervix normal in appearance, with stenotic cervical os. No cervical motion tenderness, no adnexal masses or tenderness.  Uterus normal size, shape, mobile, regular contours, nontender.  Rectal:    Normal external sphincter.  No hemorrhoids  appreciated. Internal exam not done.   Extremities:   Extremities normal, atraumatic, no cyanosis or edema  Pulses:   2+ and symmetric  all extremities  Skin:   Skin color, texture, turgor normal, no rashes or lesions  Lymph nodes:   Cervical, supraclavicular, and axillary nodes normal  Neurologic:   CNII-XII intact, normal strength, sensation and reflexes throughout   .  Labs:  Lab Results  Component Value Date   WBC 10.1 12/30/2023   HGB 12.2 12/30/2023   HCT 37.0 12/30/2023   MCV 81.0 12/30/2023   PLT 329 12/30/2023    Lab Results  Component Value Date   CREATININE 0.84 12/30/2023   BUN 13 12/30/2023   NA 140 12/30/2023   K 4.0 12/30/2023   CL 106 12/30/2023   CO2 27 12/30/2023    Lab Results  Component Value Date   ALT 7 12/30/2023   AST 12 12/30/2023   ALKPHOS 67 11/06/2023   BILITOT 0.3 12/30/2023    Lab Results  Component Value Date   TSH 1.05 12/01/2021     Assessment:   1. Encounter for well woman exam with routine gynecological exam   2. Cervical cancer screening   3. Menorrhagia with irregular cycle   4. Morbid obesity (HCC)   5. Diabetes mellitus without complication (HCC)   6. Encounter for screening mammogram for malignant neoplasm of breast   7. Perimenopausal   8. Pelvic pain      Plan:  Blood tests: Estradiol, FSH, LH, Progesterone level. Breast self exam technique reviewed and patient encouraged to perform self-exam monthly. Contraception: tubal ligation. Discussed healthy lifestyle modifications. Mammogram ordered Pap smear  up to date. Due in 1 year . Menorrhagia with irregular cycle, now may be entering perimenopause. Discussed dx, labs ordered.  Diabetes managed by PCP.  Pelvic pain, unclear cause but most likely hormonal due to period of amenorrhea. However will order [place] to r/o other causes.  Follow up in 1 year for annual exam   Hildred Laser, MD [facility] at [facility]

## 2024-01-29 NOTE — Patient Instructions (Signed)
 Preventive Care 9-47 Years Old, Female Preventive care refers to lifestyle choices and visits with your health care provider that can promote health and wellness. Preventive care visits are also called wellness exams. What can I expect for my preventive care visit? Counseling Your health care provider may ask you questions about your: Medical history, including: Past medical problems. Family medical history. Pregnancy history. Current health, including: Menstrual cycle. Method of birth control. Emotional well-being. Home life and relationship well-being. Sexual activity and sexual health. Lifestyle, including: Alcohol, nicotine or tobacco, and drug use. Access to firearms. Diet, exercise, and sleep habits. Work and work Astronomer. Sunscreen use. Safety issues such as seatbelt and bike helmet use. Physical exam Your health care provider will check your: Height and weight. These may be used to calculate your BMI (body mass index). BMI is a measurement that tells if you are at a healthy weight. Waist circumference. This measures the distance around your waistline. This measurement also tells if you are at a healthy weight and may help predict your risk of certain diseases, such as type 2 diabetes and high blood pressure. Heart rate and blood pressure. Body temperature. Skin for abnormal spots. What immunizations do I need?  Vaccines are usually given at various ages, according to a schedule. Your health care provider will recommend vaccines for you based on your age, medical history, and lifestyle or other factors, such as travel or where you work. What tests do I need? Screening Your health care provider may recommend screening tests for certain conditions. This may include: Lipid and cholesterol levels. Diabetes screening. This is done by checking your blood sugar (glucose) after you have not eaten for a while (fasting). Pelvic exam and Pap test. Hepatitis B test. Hepatitis C  test. HIV (human immunodeficiency virus) test. STI (sexually transmitted infection) testing, if you are at risk. Lung cancer screening. Colorectal cancer screening. Mammogram. Talk with your health care provider about when you should start having regular mammograms. This may depend on whether you have a family history of breast cancer. BRCA-related cancer screening. This may be done if you have a family history of breast, ovarian, tubal, or peritoneal cancers. Bone density scan. This is done to screen for osteoporosis. Talk with your health care provider about your test results, treatment options, and if necessary, the need for more tests. Follow these instructions at home: Eating and drinking  Eat a diet that includes fresh fruits and vegetables, whole grains, lean protein, and low-fat dairy products. Take vitamin and mineral supplements as recommended by your health care provider. Do not drink alcohol if: Your health care provider tells you not to drink. You are pregnant, may be pregnant, or are planning to become pregnant. If you drink alcohol: Limit how much you have to 0-1 drink a day. Know how much alcohol is in your drink. In the [place] one drink equals one 12 oz bottle of beer (355 mL), one 5 oz glass of wine (148 mL), or one 1 oz glass of hard liquor (44 mL). Lifestyle Brush your teeth every morning and night with fluoride toothpaste. Floss one time each day. Exercise for at least 30 minutes 5 or more days each week. Do not use any products that contain nicotine or tobacco. These products include cigarettes, chewing tobacco, and vaping devices, such as e-cigarettes. If you need help quitting, ask your health care provider. Do not use drugs. If you are sexually active, practice safe sex. Use a condom or other form of protection to  prevent STIs. If you do not wish to become pregnant, use a form of birth control. If you plan to become pregnant, see your health care provider for a  prepregnancy visit. Take aspirin only as told by your health care provider. Make sure that you understand how much to take and what form to take. Work with your health care provider to find out whether it is safe and beneficial for you to take aspirin daily. Find healthy ways to manage stress, such as: Meditation, yoga, or listening to music. Journaling. Talking to a trusted person. Spending time with friends and family. Minimize exposure to UV radiation to reduce your risk of skin cancer. Safety Always wear your seat belt while driving or riding in a vehicle. Do not drive: If you have been drinking alcohol. Do not ride with someone who has been drinking. When you are tired or distracted. While texting. If you have been using any mind-altering substances or drugs. Wear a helmet and other protective equipment during sports activities. If you have firearms in your house, make sure you follow all gun safety procedures. Seek help if you have been physically or sexually abused. What's next? Visit your health care provider once a year for an annual wellness visit. Ask your health care provider how often you should have your eyes and teeth checked. Stay up to date on all vaccines. This information is not intended to replace advice given to you by your health care provider. Make sure you discuss any questions you have with your health care provider. Document Revised: 04/19/2021 Document Reviewed: 04/19/2021 Elsevier Patient Education  2024 Elsevier Inc.     Breast Self-Awareness Breast self-awareness means being familiar with how your breasts look and feel. It involves checking your breasts regularly and telling your health care provider about any changes. Practicing breast self-awareness helps to maintain breast health. Sometimes, changes are not harmful (are benign). Other times, a change in your breasts can be a sign of a serious medical problem. Being familiar with the look and feel of  your breasts can help you catch a breast problem while it is still small and can be treated. You should do breast self-exams even if you have breast implants. What you need: A mirror. A well-lit room. A pillow or other soft object. How to do a breast self-exam A breast self-exam is one way to learn what is normal for your breasts and whether your breasts are changing. To do a breast self-exam: Look for changes  Remove all the clothing above your waist. Stand in front of a mirror in a room with good lighting. Put your hands down at your sides. Compare your breasts in the mirror. Look for differences between them (asymmetry), such as: Differences in shape. Differences in size. Puckers, dips, and bumps in one breast and not the other. Look at each breast for changes in the skin, such as: Redness. Scaly areas. Skin thickening. Dimpling. Open sores (ulcers). Look for changes in your nipples, such as: Discharge. Bleeding. Dimpling. Redness. A nipple that looks pushed in (retracted), or that has changed position. Feel for changes Carefully feel your breasts for lumps and changes. It is best to do this self-exam while lying down. Follow these steps to feel each breast: Place a pillow under the shoulder of one side of your body. Place the arm of that side of your body behind your head. Feel the breast of that side of your body using the hand of the opposite arm. To do  this: Start in the nipple area and use the pads of your three middle fingers to make -inch (2 cm) overlapping circles. Use light, medium, and then firm pressure as you feel your breast, gently covering the entire breast area and armpit. Continue the overlapping circles, moving downward over the breast until you feel your ribs below your breast. Then, make circles with your fingers going upward until you reach your collarbone. Next, make circles by moving outward across your breast and into your armpit area. Squeeze the  nipple. Check for discharge and lumps. Repeat steps 1-7 to check your other breast. Sit or stand in the tub or shower. With soapy water on your skin, feel each breast the same way you did when you were lying down. Write down what you find Writing down what you find can help you remember what to discuss with your health care provider. Write down: What is normal for each breast. Any changes that you find in each breast. These include: The kind of changes you find. Any pain or tenderness. Size and location of any lumps. Where you are in your menstrual cycle, if you are still getting your menstrual period (menstruating). General tips If you are breastfeeding, the best time to examine your breasts is after a feeding or after using a breast pump. If you menstruate, the best time to examine your breasts is 5-7 days after your menstrual period. Breasts are generally lumpier during menstrual periods, and it may be more difficult to notice changes. With time and practice, you will become more familiar with the differences in your breasts and more comfortable with the exam. Contact a health care provider if: You see a change in the shape or size of your breasts or nipples. You see a change in the skin of your breast or nipples, such as a reddened or scaly area. You have unusual discharge from your nipples. You find a new lump or thick area. You have breast pain. You have any concerns about your breast health. Summary Breast self-awareness includes looking for physical changes in your breasts and feeling for any changes within your breasts. Breast self-awareness should be done in front of a mirror in a well-lit room. If you menstruate, the best time to examine your breasts is 5-7 days after your menstrual period. Tell your health care provider about any changes you notice in your breasts. Changes include changes in size, changes on the skin, pain or tenderness, or unusual fluid from your  nipples. This information is not intended to replace advice given to you by your health care provider. Make sure you discuss any questions you have with your health care provider. Document Revised: 03/29/2022 Document Reviewed: 08/24/2021 Elsevier Patient Education  2024 [employer].

## 2024-01-30 ENCOUNTER — Encounter: Payer: Self-pay | Admitting: Obstetrics and Gynecology

## 2024-01-30 LAB — FSH/LH
FSH: 4.1 m[IU]/mL
LH: 8.3 m[IU]/mL

## 2024-01-30 LAB — PROGESTERONE: Progesterone: 0.8 ng/mL

## 2024-01-30 LAB — ESTRADIOL: Estradiol: 161 pg/mL

## 2024-01-31 ENCOUNTER — Encounter: Payer: Self-pay | Admitting: Obstetrics and Gynecology

## 2024-01-31 MED ORDER — MEDROXYPROGESTERONE ACETATE 10 MG PO TABS: 10.0000 mg | ORAL_TABLET | Freq: Every day | ORAL | 2 refills | Status: DC

## 2024-01-31 NOTE — Addendum Note (Signed)
 Addended by: Fabian November on: 01/31/2024 02:49 PM   Modules accepted: Orders

## 2024-02-04 ENCOUNTER — Ambulatory Visit
Admission: RE | Admit: 2024-02-04 | Discharge: 2024-02-04 | Disposition: A | Discharge status: Home / Self Care | Source: Ambulatory Visit | Attending: Family Medicine | Admitting: Family Medicine

## 2024-02-04 DIAGNOSIS — N6321 Unspecified lump in the left breast, upper outer quadrant: Secondary | ICD-10-CM

## 2024-02-19 ENCOUNTER — Ambulatory Visit
Admission: RE | Admit: 2024-02-19 | Discharge: 2024-02-19 | Disposition: A | Discharge status: Home / Self Care | Source: Ambulatory Visit | Attending: Obstetrics and Gynecology | Admitting: Obstetrics and Gynecology

## 2024-02-19 DIAGNOSIS — N921 Excessive and frequent menstruation with irregular cycle: Secondary | ICD-10-CM | POA: Diagnosis present

## 2024-02-19 DIAGNOSIS — R102 Pelvic and perineal pain: Secondary | ICD-10-CM | POA: Diagnosis present

## 2024-02-20 ENCOUNTER — Encounter: Payer: Self-pay | Admitting: Obstetrics and Gynecology

## 2024-02-25 ENCOUNTER — Encounter: Payer: Self-pay | Admitting: Obstetrics and Gynecology

## 2024-02-25 ENCOUNTER — Ambulatory Visit (INDEPENDENT_AMBULATORY_CARE_PROVIDER_SITE_OTHER): Admitting: Obstetrics and Gynecology

## 2024-02-25 VITALS — BP 119/80 | HR 92 | Ht 67.0 in | Wt 250.4 lb

## 2024-02-25 DIAGNOSIS — K439 Ventral hernia without obstruction or gangrene: Secondary | ICD-10-CM

## 2024-02-25 DIAGNOSIS — R102 Pelvic and perineal pain: Secondary | ICD-10-CM | POA: Diagnosis not present

## 2024-02-25 DIAGNOSIS — D259 Leiomyoma of uterus, unspecified: Secondary | ICD-10-CM

## 2024-02-25 DIAGNOSIS — D219 Benign neoplasm of connective and other soft tissue, unspecified: Secondary | ICD-10-CM

## 2024-02-25 NOTE — Progress Notes (Signed)
 HPI:      Erin Good is a 48 y.o. [id number] who LMP was Patient's last menstrual period was 10/20/2023 (approximate).  Subjective:   She presents today for follow-up of pelvic pain, uterine fibroids, amenorrhea for 4 months. She was not aware that she had Provera  prescription waiting for her for a progesterone  withdrawal challenge. Her most recent [facility] was normal and does not indicate menopause despite her lack of menses. She reports that she has midline abdominal pain that she feels a pulling sensation daily.    Hx: The following portions of the patient's history were reviewed and updated as appropriate:             She  has a past medical history of Allergic rhinitis, Breast discharge (06/05/2017), Breast mass (12/06/2016), COVID-19 virus infection (11/2020), Diabetes mellitus without complication (HCC), Dyslipidemia, Epilepsy (HCC), Febrile seizures (HCC), Galactorrhea, gestational diabetes, Hypertension, Kidney stones, Morbid obesity (HCC), Morbid obesity with BMI of 40.0-44.9, adult (HCC), Obesity, Seizures (HCC), Sleep apnea, Syncope and collapse, and Tachycardia. She does not have any pertinent problems on file. She  has a past surgical history that includes Cesarean section; Tubal ligation (2007); Right oophorectomy (Right, 2001); Breast biopsy (Left, 2018); Irrigation and debridement shoulder (Left, 10/12/2020); and Colonoscopy with propofol  (N/A, 09/26/2022). Her family history includes Breast cancer (age of onset: 38) in her paternal grandmother; Cancer in her maternal grandmother, paternal aunt, and paternal grandmother; Diabetes in her mother; Healthy in her father. She  reports that she has never smoked. She has never used smokeless tobacco. She reports that she does not currently use alcohol. She reports that she does not currently use drugs. She has a current medication list which includes the following prescription(s): vitamin d , clonazepam , b-12, epinephrine , folic  acid, gavilyte-g, lantus  solostar, insulin  pen needle, ketoconazole , metformin , mounjaro , onetouch delica lancets 33g, onetouch verio, rosuvastatin , zonisamide , and medroxyprogesterone . She is allergic to peanuts [peanut oil], aspirin, aspirin, and other.       Review of Systems:  Review of Systems  Constitutional: Denied constitutional symptoms, night sweats, recent illness, fatigue, fever, insomnia and weight loss.  Eyes: Denied eye symptoms, eye pain, photophobia, vision change and visual disturbance.  Ears/Nose/Throat/Neck: Denied ear, nose, throat or neck symptoms, hearing loss, nasal discharge, sinus congestion and sore throat.  Cardiovascular: Denied cardiovascular symptoms, arrhythmia, chest pain/pressure, edema, exercise intolerance, orthopnea and palpitations.  Respiratory: Denied pulmonary symptoms, asthma, pleuritic pain, productive sputum, cough, dyspnea and wheezing.  Gastrointestinal: Denied, gastro-esophageal reflux, melena, nausea and vomiting.  Genitourinary: Denied genitourinary symptoms including symptomatic vaginal discharge, pelvic relaxation issues, and urinary complaints.  Musculoskeletal: Denied musculoskeletal symptoms, stiffness, swelling, muscle weakness and myalgia.  Dermatologic: Denied dermatology symptoms, rash and scar.  Neurologic: Denied neurology symptoms, dizziness, headache, neck pain and syncope.  Psychiatric: Denied psychiatric symptoms, anxiety and depression.  Endocrine: Denied endocrine symptoms including hot flashes and night sweats.   Meds:   Current Outpatient Medications on File Prior to Visit  Medication Sig Dispense Refill   Cholecalciferol (VITAMIN D ) 50 MCG (2000 UT) CAPS Take 1 capsule by mouth daily at 12 noon.     clonazePAM  (KLONOPIN ) 0.5 MG tablet Take 0.5 mg by mouth daily as needed.     Cyanocobalamin (B-12) 500 MCG SUBL Place 1 tablet under the tongue daily at 12 noon. 100 tablet 0   EPINEPHrine  0.3 mg/0.3 mL IJ SOAJ injection  Inject 0.3 mg into the muscle as needed for anaphylaxis (for anaphylaxis).      folic acid  (FOLVITE )  1 MG tablet Take 1 tablet (1 mg total) by mouth daily. 100 tablet 1   GAVILYTE-G 236 g solution Take 4,000 mLs by mouth once.     insulin  glargine (LANTUS  SOLOSTAR) 100 UNIT/ML Solostar Pen INJECT SUBCUTANEOUSLY 30-35 UNITS  DAILY     Insulin  Pen Needle 32G X 4 MM MISC Use 1x a day 100 each 3   ketoconazole  (NIZORAL ) 2 % cream Apply 1 Application topically daily. 120 g 0   metFORMIN  (GLUCOPHAGE -XR) 500 MG 24 hr tablet Take 4 tablets (2,000 mg total) by mouth daily with supper. 360 tablet 3   MOUNJARO  15 MG/0.5ML Pen INJECT THE CONTENTS OF ONE PEN  SUBCUTANEOUSLY WEEKLY AS  DIRECTED 6 mL 3   OneTouch Delica Lancets 33G MISC Use 2x a day with OneTouch Verio Flex 200 each 3   ONETOUCH VERIO test strip USE 2X A DAY WITH ONETOUCH VERIO FLEX 50 strip 15   rosuvastatin  (CRESTOR ) 20 MG tablet Take 1 tablet (20 mg total) by mouth daily. 90 tablet 3   zonisamide  (ZONEGRAN ) 100 MG capsule Take 400 mg by mouth at bedtime.     medroxyPROGESTERone  (PROVERA ) 10 MG tablet Take 1 tablet (10 mg total) by mouth daily. Use for ten days (Patient not taking: Reported on 02/25/2024) 10 tablet 2   No current facility-administered medications on file prior to visit.      Objective:     Vitals:   02/25/24 0846  BP: 119/80  Pulse: 92   Filed Weights   02/25/24 0846  Weight: 250 lb 6.4 oz (113.6 kg)              Abdominal examination reveals midline pain but not necessarily pelvic.  This pain is palpable in the abdominal wall.  She has pain with Valsalva and it is exacerbated by pressure. This is not uterine or ovarian pain          Assessment:    [id number] Patient Active Problem List   Diagnosis Date Noted   History of abnormal cervical Pap smear 01/29/2024   Menorrhagia with irregular cycle 01/29/2024   Diabetes mellitus without complication (HCC) 01/29/2024   Abnormal screening mammogram 01/29/2024    Obesity, Class III, BMI 40-49.9 (morbid obesity) (HCC) 11/06/2023   Encounter for well woman exam with routine gynecological exam 09/26/2022   Morbid obesity (HCC)    Impingement syndrome of left shoulder region 04/20/2020   Vitamin D  deficiency 01/31/2017   Bell's palsy 12/19/2016   Allergic rhinitis 04/20/2015   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 Fibroid   2. Pelvic pain   3. Hernia of abdominal wall     Pain localized to the abdominal wall.  Doubt pelvic pain from uterine fibroids.  They are small.   Plan:            1.  Patient appears to be premenopausal based on [facility] although she is currently amenorrheic.  Awaiting progesterone  challenge.  Patient instructed to go forth with this and let us  know if she has menstrual periods return.  2.  Referral to general surgery-patient desires-for possible abdominal wall pain possible midline abdominal hernia. (Recent significant weight loss)  3.  Repeat FSH if patient amenorrheic 6  weeks after progesterone  challenge.  Orders Orders Placed This Encounter  Procedures   Ambulatory referral to General Surgery    No orders of the defined types were placed in this encounter.     F/U  Return for Pt to contact us  if symptoms worsen.  Delice Felt, M.D. 02/25/2024 9:29 AM

## 2024-02-25 NOTE — Progress Notes (Signed)
 Patient presents today to discuss recent ultrasound findings showing fibroids. Reports she has continued to not have a cycle since December and still having pelvic pain.

## 2024-03-02 ENCOUNTER — Encounter: Payer: Self-pay | Admitting: Surgery

## 2024-03-02 ENCOUNTER — Ambulatory Visit (INDEPENDENT_AMBULATORY_CARE_PROVIDER_SITE_OTHER): Admitting: Surgery

## 2024-03-02 VITALS — BP 121/83 | HR 88 | Temp 97.9°F | Ht 67.0 in | Wt 254.2 lb

## 2024-03-02 DIAGNOSIS — R1030 Lower abdominal pain, unspecified: Secondary | ICD-10-CM

## 2024-03-02 DIAGNOSIS — K5900 Constipation, unspecified: Secondary | ICD-10-CM | POA: Diagnosis not present

## 2024-03-02 NOTE — Patient Instructions (Addendum)
 We have scheduled you for a CT Scan of your Abdomen and Pelvis with contrast. This has been scheduled at [facility]  on 03/09/2024 . Please arrive there by 8;15 am . If you need to reschedule your Scan, you may do so by calling [phone]. Please let us  know if you reschedule your scan as we have to get authorization from your insurance for this.    Please have lab drawn at [facility]. You do not need an appointment.    Advised to pursue a goal of 25 to 30 g of fiber daily.  The majority of this may be through natural sources, advised to ensure a minimal daily fiber supplementation.  Various forms of supplements discussed.  Recommend Psyllium husk, with options of mixing with beverage or applesauce to make more tolerable. Strongly advised to consume more fluids(especially in proximity to fiber intake) and to ensure adequate hydration.   Watch color of urine to determine adequacy of hydration.  Clarity is pursued in urine output, and bowel activity that responds and corresponds to significant meal intake.   We need to avoid deferring having bowel movements, advised to take the time at the first sign of sensation, typically following meals, and in the morning.  The need to avoid more frequently, and the presence of flatus may indicate the need for bowel movement.  Do not defer for later.  Addition of MiraLAX (or its generic equivalent) may be needed ensure at least twice daily bowel movements.  If multiple doses of MiraLAX are necessary utilize them. Never skip a day... Do not tolerate a day without a bowel movement unless you are fasting.  To be regular, we must do the above EVERY day.   Soluble Fiber Dissolves in Water : Soluble fiber dissolves in water  to form a gel-like substance. Slows Digestion: This type of fiber slows down digestion, which can help control blood sugar levels and lower cholesterol. Sources: Common sources include oats, beans, apples, citrus fruits, and  psyllium. Benefits: Helps manage cholesterol levels. Aids in blood sugar control. Increases healthy gut bacteria, which can lower inflammation and improve digestion.  Insoluble Fiber Does Not Dissolve in Water : Insoluble fiber does not dissolve in water  and remains mostly intact as it passes through the digestive system. Adds Bulk to Stool: It adds bulk to stool, which helps promote regular bowel movements and prevent constipation. Sources: Common sources include whole grains, nuts, beans, and vegetables like cauliflower and potatoes. Benefits: Improves bowel health and regularity. Reduces the risk of colorectal conditions like hemorrhoids and diverticulitis. Supports insulin  sensitivity in people with diabetes.  Both types of fiber are essential for overall health, and it's beneficial to include a 50/50 mix of both in your diet.

## 2024-03-02 NOTE — Progress Notes (Unsigned)
 03/02/2024  Reason for Visit:  Lower abdominal pain  Requesting Provider:  Billy Bue, MD  History of Present Illness: Erin Good is a 48 y.o. female presenting for evaluation of lower abdominal pain.  She recently saw Dr. Luster Salters for this and she had a pelvic and transvaginal ultrasound to evaluate.  This showed uterine fibroids, largest of which measured 2.2 x 1.8 x 1.8 cm.  Given the small size of her fibroids, had low suspicion for this to be the cause of her pain.  On exam, also her pain was more low abdominal and not exactly pelvic.  She also has had significant intentional weight loss over the past few years and has a lot of excess skin/pannus in the lower abdomen.  It was thought that perhaps she had a hernia in the lower abdomen.  The patient reports that she also has long-standing issues with constipation. She has a bowel movement about every 3 days and feels that she has to strain and push hard for a bowel movement.  Her pain intensifies with increased activity and straining.  However, she denies any bulging sensation or any swelling in the lower abdomen.  She has had two c-sections in the past, via Pfannenstiel incision and low vertical incision.  Past Medical History: Past Medical History:  Diagnosis Date   Allergic rhinitis    Breast discharge 06/05/2017   2 weeks ago left   Breast mass 12/06/2016   left   COVID-19 virus infection 11/2020   Diabetes mellitus without complication (HCC)    Dyslipidemia    Epilepsy (HCC)    Febrile seizures (HCC)    Galactorrhea    Hx gestational diabetes    Hypertension    Kidney stones    Morbid obesity (HCC)    Morbid obesity with BMI of 40.0-44.9, adult (HCC)    Obesity    Seizures (HCC)    Sleep apnea    Syncope and collapse    Tachycardia      Past Surgical History: Past Surgical History:  Procedure Laterality Date   BREAST BIOPSY Left 2018   benign   CESAREAN SECTION     X 2   COLONOSCOPY WITH PROPOFOL  N/A  09/26/2022   Procedure: COLONOSCOPY WITH PROPOFOL ;  Surgeon: Selena Daily, MD;  Location: [facility] END[facility];  Service: Gastroenterology;  Laterality: N/A;   IRRIGATION AND DEBRIDEMENT SHOULDER Left 10/12/2020   Procedure: IRRIGATION AND DEBRIDEMENT SHOULDER;  Surgeon: Micheline Ahr, MD;  Location: [facility];  Service: Orthopedics;  Laterality: Left;   RIGHT OOPHORECTOMY Right 2001   benign tumor   TUBAL LIGATION  2007    Home Medications: Prior to Admission medications   Medication Sig Start Date End Date Taking? Authorizing Provider  Cholecalciferol (VITAMIN D ) 50 MCG (2000 UT) CAPS Take 1 capsule by mouth daily at 12 noon.   Yes [provider]  clonazePAM  (KLONOPIN ) 0.5 MG tablet Take 0.5 mg by mouth daily as needed. 08/15/23  Yes [provider]  Cyanocobalamin (B-12) 500 MCG SUBL Place 1 tablet under the tongue daily at 12 noon. 01/03/24  Yes Sowles, Krichna, MD  EPINEPHrine  0.3 mg/0.3 mL IJ SOAJ injection Inject 0.3 mg into the muscle as needed for anaphylaxis (for anaphylaxis).  06/14/14  Yes [provider]  folic acid  (FOLVITE ) 1 MG tablet Take 1 tablet (1 mg total) by mouth daily. 01/03/24  Yes Sowles, Krichna, MD  GAVILYTE-G 236 g solution Take 4,000 mLs by mouth once. 12/31/23  Yes [provider]  insulin  glargine (LANTUS  SOLOSTAR) 100 UNIT/ML Solostar Pen INJECT SUBCUTANEOUSLY 30-35 UNITS  DAILY 07/04/23  Yes Emilie Harden, MD  Insulin  Pen Needle 32G X 4 MM MISC Use 1x a day 07/03/19  Yes Emilie Harden, MD  ketoconazole  (NIZORAL ) 2 % cream Apply 1 Application topically daily. 01/03/24  Yes Sowles, Krichna, MD  medroxyPROGESTERone  (PROVERA ) 10 MG tablet Take 1 tablet (10 mg total) by mouth daily. Use for ten days 01/31/24  Yes Teresa Fender, MD  metFORMIN  (GLUCOPHAGE -XR) 500 MG 24 hr tablet Take 4 tablets (2,000 mg total) by mouth daily with supper. 07/04/23  Yes Emilie Harden, MD  MOUNJARO  15 MG/0.5ML Pen INJECT THE CONTENTS OF ONE PEN   SUBCUTANEOUSLY WEEKLY AS  DIRECTED 05/14/23  Yes Emilie Harden, MD  OneTouch Delica Lancets 33G MISC Use 2x a day with OneTouch Verio Flex 07/03/19  Yes Emilie Harden, MD  [facility] VERIO test strip USE 2X A DAY WITH ONETOUCH VERIO FLEX 10/02/21  Yes Emilie Harden, MD  rosuvastatin  (CRESTOR ) 20 MG tablet Take 1 tablet (20 mg total) by mouth daily. 10/31/20  Yes Emilie Harden, MD  zonisamide  (ZONEGRAN ) 100 MG capsule Take 400 mg by mouth at bedtime. 08/15/23  Yes [provider]    Allergies: Allergies  Allergen Reactions   Peanuts [Peanut Oil] Anaphylaxis   Aspirin Other (See Comments)    Does not take because of her epilepsy/seizure    Aspirin Other (See Comments)    Causes seizures   Other Other (See Comments)    Peanuts    Social History:  reports that she has never smoked. She has never used smokeless tobacco. She reports that she does not currently use alcohol. She reports that she does not currently use drugs.   Family History: Family History  Problem Relation Age of Onset   Diabetes Mother    Healthy Father    Cancer Maternal Grandmother    Breast cancer Paternal Grandmother 25   Cancer Paternal Grandmother    Cancer Paternal Aunt    Heart disease Neg Hx     Review of Systems: Review of Systems  Constitutional:  Negative for chills and fever.  Respiratory:  Negative for shortness of breath.   Cardiovascular:  Negative for chest pain.  Gastrointestinal:  Positive for abdominal pain and constipation. Negative for nausea and vomiting.  Genitourinary:  Negative for dysuria.    Physical Exam BP 121/83   Pulse 88   Temp 97.9 F (36.6 C) (Oral)   Ht 5\' 7"  (1.702 m)   Wt 254 lb 3.2 oz (115.3 kg)   LMP 10/20/2023 (Approximate)   SpO2 100%   BMI 39.81 kg/m  CONSTITUTIONAL: No acute distress HEENT:  Normocephalic, atraumatic, extraocular motion intact. RESPIRATORY:  Lungs are clear, and breath sounds are equal bilaterally. Normal respiratory  effort without pathologic use of accessory muscles. CARDIOVASCULAR: Heart is regular without murmurs, gallops, or rubs. GI: The abdomen is soft, obese, non-distended, with mild tenderness to palpation in the lower abdomen.  In the midline, there's not specific hernia defect, though she may have a component of diastasis.  Given body habitus, it is difficult to do a good exam.  Nonetheless, no bulging or protrusion can be palpated when she strains or coughs.  MUSCULOSKELETAL:  Normal muscle strength and tone in all four extremities.  No peripheral edema or cyanosis. NEUROLOGIC:  Motor and sensation is grossly normal.  Cranial nerves are grossly intact. PSYCH:  Alert and oriented to person, place and time. Affect is  normal.  Laboratory Analysis: Labs from 12/30/23: Na 140, K 4, Cl 106, CO2 27, BUN 13, Cr 0.84.  LFTs within normal limits.  WBC 10.1, Hgb 12.2, Hct 37, Plt 329.   Assessment and Plan: This is a 48 y.o. female with lower abdominal pain.  --Discussed with the patient that on exam, it is not clear that she has a hernia.  She could have an area of diastasis, but difficult to tell for sure.  There is no obvious protrusion or swelling either.  As such, would be better to obtain a CT scan to better evaluate for any potential hernias and also for any other etiology to her lower abdominal pain.  Discussed with her that constipation can cause lower abdominal and pelvic pain and sometimes back pain.  It would be beneficial to work on improving her constipation and discussed with her the role for Miralax daily, adding fiber to her diet, and increasing fluid intake. --Patient will follow up with me after CT scan is done to discuss results.  All of her questions have been answered.  I spent 30 minutes dedicated to the care of this patient on the date of this encounter to include pre-visit review of records, face-to-face time with the patient discussing diagnosis and management, and any post-visit  coordination of care.   Marene Shape, MD [facility]

## 2024-03-09 ENCOUNTER — Ambulatory Visit: Admission: RE | Admit: 2024-03-09 | Source: Ambulatory Visit

## 2024-03-16 ENCOUNTER — Ambulatory Visit: Admitting: Surgery

## 2024-03-20 ENCOUNTER — Ambulatory Visit: Admit: 2024-03-20 | Payer: 59 | Admitting: Gastroenterology

## 2024-03-20 SURGERY — COLONOSCOPY WITH PROPOFOL
Anesthesia: General

## 2024-04-02 ENCOUNTER — Other Ambulatory Visit: Payer: Self-pay | Admitting: Internal Medicine

## 2024-04-02 DIAGNOSIS — E1129 Type 2 diabetes mellitus with other diabetic kidney complication: Secondary | ICD-10-CM

## 2024-04-22 ENCOUNTER — Ambulatory Visit
Admission: RE | Admit: 2024-04-22 | Discharge: 2024-04-22 | Disposition: A | Discharge status: Home / Self Care | Source: Ambulatory Visit | Attending: Surgery | Admitting: Surgery

## 2024-04-22 DIAGNOSIS — K5900 Constipation, unspecified: Secondary | ICD-10-CM | POA: Insufficient documentation

## 2024-04-22 DIAGNOSIS — R1030 Lower abdominal pain, unspecified: Secondary | ICD-10-CM | POA: Diagnosis present

## 2024-04-22 LAB — POCT I-STAT CREATININE: Creatinine, Ser: 0.9 mg/dL (ref 0.44–1.00)

## 2024-04-22 MED ORDER — IOHEXOL 300 MG/ML  SOLN
100.0000 mL | Freq: Once | INTRAMUSCULAR | Status: AC | PRN
  Administered 2024-04-22: 100 mL via INTRAVENOUS

## 2024-04-23 ENCOUNTER — Telehealth: Payer: Self-pay

## 2024-04-23 ENCOUNTER — Other Ambulatory Visit: Payer: Self-pay

## 2024-04-23 DIAGNOSIS — K429 Umbilical hernia without obstruction or gangrene: Secondary | ICD-10-CM

## 2024-04-23 DIAGNOSIS — Z9889 Other specified postprocedural states: Secondary | ICD-10-CM

## 2024-04-23 NOTE — Telephone Encounter (Signed)
 Faxed referral to Dr. Oza Blumenthal at [facility] at [phone].

## 2024-04-25 ENCOUNTER — Other Ambulatory Visit: Payer: Self-pay | Admitting: Internal Medicine

## 2024-04-28 ENCOUNTER — Ambulatory Visit: Payer: Self-pay

## 2024-04-28 NOTE — Telephone Encounter (Signed)
 FYI Only or Action Required?: FYI only for provider.  Patient was last seen in primary care on 01/03/2024 by Glenard Mire, MD. Called Nurse Triage reporting Back Pain. Symptoms began a week ago. Interventions attempted: OTC medications: tylenol  and Rest, hydration, or home remedies. Symptoms are: lower right back pain, urinary frequency, cloudy urine gradually worsening.  Triage Disposition: See HCP Within 4 Hours (Or PCP Triage)- no available appts with [facility] til July 3rd, patient going to urgent care  Patient/caregiver understands and will follow disposition?: Yes                             Copied from CRM [id number]. Topic: Clinical - Red Word Triage >> Apr 28, 2024  1:26 PM Rachelle R wrote: [facility] that prompted transfer to Nurse Triage: Patient states she has had pain in her back for the last week. Every time she moves it feels like a sharp pain. Thinks it kidney stones, had a CT last week and they were shown on the scan. Reason for Disposition  [1] Pain or burning with passing urine (urination) AND [2] flank pain (i.e., in side, below ribs and above hip)  Answer Assessment - Initial Assessment Questions No available appoinments with PCP or at [facility] til July 3rd. Offered [facility] and patient declined and states she will go to [facility] urgent care today.   1. ONSET: When did the pain begin?      A week ago.  2. LOCATION: Where does it hurt? (upper, mid or lower back)     Lower right side.  3. SEVERITY: How bad is the pain?  (e.g., Scale 1-10; mild, moderate, or severe)   - MILD (1-3): Doesn't interfere with normal activities.    - MODERATE (4-7): Interferes with normal activities or awakens from sleep.    - SEVERE (8-10): Excruciating pain, unable to do any normal activities.      Mild at rest in a comfortable position and severe in certain positions.  4. PATTERN: Is the pain constant? (e.g., yes, no; constant, intermittent)       Comes and goes, worse in certain positions.  5. RADIATION: Does the pain shoot into your legs or somewhere else?     No.  6. CAUSE:  What do you think is causing the back pain?      She states she had a CT scan done last week (ordered by surgeon, she states he did not address the kidney stones) and kidney stones found on imaging.  7. BACK OVERUSE:  Any recent lifting of heavy objects, strenuous work or exercise?     No.  8. MEDICINES: What have you taken so far for the pain? (e.g., nothing, acetaminophen , NSAIDS)     Tylenol  last night.  9. NEUROLOGIC SYMPTOMS: Do you have any weakness, numbness, or problems with bowel/bladder control?     Denies weakness or numbness, blood in urine, burning with urination. Patient states she is having cloudy urine and urinary frequency.  10. OTHER SYMPTOMS: Do you have any other symptoms? (e.g., fever, abdomen pain, burning with urination, blood in urine)       Patient denies fever or abdominal pain.  11. PREGNANCY: Is there any chance you are pregnant? When was your last menstrual period?       LMP: 02/28/24. She states she has had tubal ligation.  Protocols used: Back Pain-A-AH

## 2024-05-11 ENCOUNTER — Ambulatory Visit: Admitting: Internal Medicine

## 2024-05-11 NOTE — Progress Notes (Deleted)
 Patient ID: Erin Good, female   DOB: 02-Dec-1975, 48 y.o.   MRN: [id number]   HPI: Erin Good is a 48 y.o.-year-old female, initially referred by her PCP, Dr. Glenard, presenting for follow-up for DM2, dx in ~2015, insulin -dependent since 2019, uncontrolled, with complications (diabetic retinopathy, microalbuminuria) and also for thyroid  nodules.  Last visit 4 months ago.  Interim history: No increased urination, blurry vision, nausea, chest pain.  She lost approximately 70 pounds after starting Mounjaro .  Reviewed HbA1c levels: Lab Results  Component Value Date   HGBA1C 5.7 (A) 01/07/2024   HGBA1C 5.9 (A) 07/04/2023   HGBA1C 5.8 (A) 01/03/2023   HGBA1C 6.2 (A) 09/04/2022   HGBA1C 6.5 (A) 04/04/2022   HGBA1C 7.8 (A) 12/01/2021   HGBA1C 9.7 (A) 09/01/2021   HGBA1C 7.6 (H) 10/03/2020   HGBA1C 7.9 (A) 08/15/2020   HGBA1C 9.0 (H) 06/30/2020   Pt was on a regimen of: - Metformin  ER 750 mg 2x a day, with meals - Soliqua  (Lantus  + Lixisenatide ) 50 units daily in am  Currently on: - Metformin  ER 750 mg 2x a day with meals >> 1500 >> 2000 mg with dinner - Lantus  50 >> 56 units at bedtime - occas. Skipping 2/2 lows >> 40 >> 30 >> 25 units daily - >> Mounjaro  7.5 >> 10 >> 12.5 >> 15 mg weekly  Tried Farxiga  06/2020 >> not covered.  She checks her sugars 1-2 times a day-no log, no meter: - am:  110-130 >> 87-110 >> 90-110, 120 >> 100-110 - 2h after b'fast: 166, 207 >> n/c >> 150-158 >> n/c - before lunch:   n/c >> 120-140 >> 110-140, 150 >> n/c  - 2h after lunch: 125-153 >> n/c >> 150s >> 140s >> <140 - before dinner: 70s, 120-140 >> 110-140, 150 >> 60 (flu) >> n/c - 2h after dinner: n/c >> 130-137, 180 >> <140, 160 - bedtime:170-330 >> N/c >> 76 if skips the meal >> n/c - nighttime: n/c Lowest sugar was 760 >> 90 >> 100; she has hypoglycemia awareness at 100. Highest sugar was 330 >> .Erin Good. 158 >> 180 (Covid) >> 160  Glucometer: [employer] advance  Pt's meals are: -  Breakfast: 2 eggs + 3 pieces of bacon >> 2 eggs - snack: fruit - Lunch: salad - Dinner:meat + veggie + starch (rice + pasta) - Snacks: 1   -No CKD, last BUN/creatinine:  Lab Results  Component Value Date   BUN 13 12/30/2023   BUN 7 11/07/2023   CREATININE 0.90 04/22/2024   CREATININE 0.84 12/30/2023  Not on ACE inhibitor/ARB.  ACR levels were slightly elevated: Lab Results  Component Value Date   MICRALBCREAT 33 (H) 11/12/2019   MICRALBCREAT 30 (H) 01/29/2019   -+ HL; last set of lipids: Lab Results  Component Value Date   CHOL 229 (H) 12/30/2023   HDL 47 (L) 12/30/2023   LDLCALC 159 (H) 12/30/2023   TRIG 112 12/30/2023   CHOLHDL 4.9 12/30/2023  Previously on Crestor  10 mg daily and I advised her to increase the dose to 20 mg daily. She takes this now.  - last eye exam was in 2024: ? DR (need to get the report).  - no numbness and tingling in her feet.  Occasional numbness in the right leg and foot.  Foot Exam 07/04/2023.  Pt has FH of DM in mother, MGF.  Thyroid  nodules:  Reviewed previous work-up: 06/01/2019: Thyroid  ultrasound: Parenchymal Echotexture: Mildly heterogenous Isthmus: 0.4 cm thickness Right lobe: 5  x 2.4 x 2.8 cm Left lobe: 4.5 x 2 x 2.1 cm    Estimated total number of nodules >/= 1 cm: 4   Nodule # 1: Location: Right; Mid Maximum size: 1.6 cm; Other 2 dimensions: 1.5 x 1.4 cm Composition: mixed cystic and solid (1) Echogenicity: hypoechoic (2) *Given size (>/= 1.5 - 2.4 cm) and appearance, a follow-up ultrasound in 1 year should be considered based on TI-RADS criteria. _________________________________________________________   Nodule # 2: Location: Right; Inferior Maximum size: 2.3 cm; Other 2 dimensions: 1.9 x 1.9 cm Composition: solid/almost completely solid (2) Echogenicity: hypoechoic (2)  **Given size (>/= 1.5 cm) and appearance, fine needle aspiration of this moderately suspicious nodule should be considered based on TI-RADS  criteria. _________________________________________________   Nodule # 3: Location: Left; Inferior Maximum size: 1.4 cm; Other 2 dimensions: 1.2 x 1.2 cm Composition: solid/almost completely solid (2) Echogenicity: isoechoic (1)  Given size (<1.4 cm) and appearance, this nodule does NOT meet TI-RADS criteria for biopsy or dedicated follow-up. _________________________________________________________   Nodule # 4: Location: Left; Mid Maximum size: 1.8 cm; Other 2 dimensions: 1.6 x 1.3 cm Composition: solid/almost completely solid (2) Echogenicity: hypoechoic (2) **Given size (>/= 1.5 cm) and appearance, fine needle aspiration of this moderately suspicious nodule should be considered based on TI-RADS criteria.   IMPRESSION: 1. Thyromegaly with bilateral nodules. 2. Recommend FNA biopsy of moderately suspicious 1.8 cm mid left AND 2.3 cm inferior right nodules. 3. Recommend annual/biennial ultrasound follow-up of additional nodules as above, until stability x5 years confirmed.  08/05/2019: FNA of the 2 nodules: Clinical History: Right inferior 2.3cm; Other 2 dimensions: 1.9 x 1.9cm,  Solid / almost completely solid, Hypoechoic, TI-RADS total points 4  Specimen Submitted:  A. THYROID , RLP, FINE NEEDLE ASPIRATION:  DIAGNOSIS:  - Consistent with benign follicular nodule (Bethesda category II)  SPECIMEN ADEQUACY:  Satisfactory for evaluation   Clinical History: Left mid 1.8cm; Other 2 dimensions: 1.6 x 1.3cm, Solid  / almost completely solid, Hypoechoic, TI-RADS total points 4  Specimen Submitted:  A. THYROID , LMP, FINE NEEDLE  ASPIRATION:  DIAGNOSIS:  - Consistent with benign follicular nodule (Bethesda category II)  SPECIMEN ADEQUACY:  Satisfactory for evaluation  Thyroid  U/S (09/18/2021): Parenchymal Echotexture: Mildly heterogenous Isthmus: 0.5 cm, previously 0.4 cm Right lobe: 6.1 x 2.2 x 2.8 cm, previously 5.0 x 2.4 x 2.8 cm Left lobe: 5.3 x 2.3 x 2.4 cm, previously 4.5  x 2.0 x 2.1 cm _________________________________________________________   Estimated total number of nodules >/= 1 cm: 5 _________________________________________________________   Nodule # 1: Location: Right; Superior Maximum size: 2.0 cm; Other 2 dimensions: 1.7 x 1.7 cm, previously 1.6 x 1.5 x 1.4 cm Composition: mixed cystic and solid (1) Echogenicity: isoechoic (1) This nodule does NOT meet TI-RADS criteria for biopsy or dedicated follow-up. _________________________________________________________   Nodule # 2: Location: Right; Mid Maximum size: 2.9 cm; Other 2 dimensions: 2.2 x 1.7 cm, previously 2.3 x 1.9 x 1.9 cm This nodule is mixed cystic and solid, and was previously biopsied.  _________________________________________________________   Nodule # 3: Location: Right; Inferior Maximum size: 1.5 cm; Other 2 dimensions: 1.4 x 0.9 cm Composition: solid/almost completely solid (2) Echogenicity: isoechoic (1) *Given size (>/= 1.5 - 2.4 cm) and appearance, a follow-up ultrasound in 1 year should be considered based on TI-RADS criteria. _________________________________________________________   Nodule # 4: Location: Left; Mid Maximum size: 2.5 cm; Other 2 dimensions: 2.0 x 1.5 cm, previously 1.8 x 1.6 x 1.2 cm This nodule is  mixed cystic and solid, and was previously biopsied. _________________________________________________________   Nodule # 5: Location: Left; Inferior Maximum size: 1.5 cm; Other 2 dimensions: 1.2 x 1.0 cm, previously 1.4 x 1.2 x 1.2 cm Composition: solid/almost completely solid (2) Echogenicity: isoechoic (1) *Given size (>/= 1.5 - 2.4 cm) and appearance, a follow-up ultrasound in 1 year should be considered based on TI-RADS criteria.  _________________________________________________________   No cervical lymphadenopathy, within the imaged neck   IMPRESSION: 1. Multinodular thyroid  gland, with similar appearance of previously-biopsied nodules as  above. 2. 1.5 cm bilateral TR 3 nodules. Attention on follow-up.  *Given size (>/= 1.5 - 2.4 cm) and appearance, a follow-up ultrasound in 1 year should be considered based on TI-RADS criteria.  Thyroid  U/S (09/06/2022): Parenchymal Echotexture: Mildly heterogenous  Isthmus: 0.4 cm  Right lobe: 6.0 x 2.3 x 3.1 cm  Left lobe: 5.1 x 2.3 x 2.4 cm  _________________________________________________________   Estimated total number of nodules >/= 1 cm: 5 _________________________________________________________   Nodule # 1:  Location: RIGHT; Superior  Maximum size: 2.1 cm; Other 2 dimensions: 1.9 x 2.5 cm, previously 2.0 x 1.7 x 1.7 cm  Composition: mixed cystic and solid (1) Echogenicity: isoechoic (1)  This nodule does NOT meet TI-RADS criteria for biopsy or dedicated follow-up.  _________________________________________________________   Nodule # 2:  Location: RIGHT; Mid/inferior  Maximum size: 3.1 cm; Other 2 dimensions: 2.5 x 1.9 cm  No aggressive features on today's evaluation. This nodule appears morphologically stable for and was previously biopsied in 08/05/2019. Assuming a benign pathologic diagnosis, repeat sampling and/or dedicated follow-up is not recommended.  _________________________________________________________   Nodule # 3:  Location: RIGHT; Inferior  Maximum size: 1.5 cm; Other 2 dimensions: 1.4 x 1.3 cm, previously 1.5 x 1.4 x 0.9 cm  Composition: solid/almost completely solid (2)  Echogenicity: isoechoic (1) *Given size (>/= 1.5 - 2.4 cm) and appearance, a follow-up ultrasound in 1 year should be considered based on TI-RADS criteria.  _________________________________________________________   Nodule # 4:  Location: LEFT; Mid  Maximum size: 2.6 cm; Other 2 dimensions: 2.2 x 2.2 cm, previously 2.5 x 2.0 x 1.5 cm  No aggressive features on today's evaluation. This nodule appears morphologically stable and was previously biopsied in 08/05/2019. Assuming  a benign pathologic diagnosis, repeat sampling and/or dedicated follow-up is not recommended.  _________________________________________________________   Nodule # 5:  Location: LEFT; Inferior  Maximum size: 1.7 cm; Other 2 dimensions: 1.6 x 1.1 cm, previously 1.5 x 1.2 x 1.0 cm  Composition: solid/almost completely solid (2)  Echogenicity: isoechoic (1) *Given size (>/= 1.5 - 2.4 cm) and appearance, a follow-up ultrasound in 1 year should be considered based on TI-RADS criteria.  _________________________________________________________   No cervical adenopathy or abnormal fluid collection within the imaged neck.   IMPRESSION: 1. Multinodular thyroid  gland. 2. 1.5 cm bilateral inferior TR-3 thyroid  nodules. A follow-up ultrasound in 1 year should be considered based on TI-RADS criteria. 3. Additional smaller and previously-biopsied nodules, as described above, without follow-up nor biopsy indicated per current criteria.  Thyroid  U/S (01/07/2024): Parenchymal Echotexture: Moderately heterogeneous  Isthmus: 0.5 cm ,previously 0.46 cm  Right lobe: 6.2 x 3.1 x 2.9 cm ,previously 6.0 x 2 point cm  Left lobe: 5.1 x 2.1 x 2.1 cm ,previously 5.1 x 2.3 x 2.4 cm  ________________________________________________________   Estimated total number of nodules >/= 1 cm: 5 _________________________________________________________   Slight interval enlargement however persistent benign appearance of solid cystic nodule in the right superior thyroid  (labeled  1, 2.6 cm, previously 2.1 cm).   Similar appearance of previously biopsied solid cystic nodule in the right mid thyroid  (labeled 2, 2.4 cm, previously 3.1 cm).   Nodule # 3:  Prior biopsy: No  Location: Right; Inferior  Maximum size: 1.3 cm; Other 2 dimensions: 1.0 x 1.0 cm, previously, 1.5 x 1.3 x 1.4 cm  Composition: spongiform (0)  Echogenicity: isoechoic (1) Change in features: Yes This nodule does NOT meet TI-RADS criteria for  biopsy or dedicated follow-up.  _________________________________________________________   There is subcentimeter pseudo nodularity about the left superior thyroid  which does not require follow-up.   Similar appearance of previously biopsied solid cystic nodule in the left mid thyroid  (labeled 5, 2.5 cm, previously labeled 4, 2.6 cm).   Ill-defined spongiform like pseudo nodularity about the posterior left mid thyroid  which is not require additional follow-up.   Nodule # 6, previously labeled 5:  Prior biopsy: No  Location: Left; Inferior   Maximum size: 1.3 cm; Other 2 dimensions: 1.1 x 1.0 cm, previously, 1.7 x 1.6 x 1.1 cm  Composition: solid/almost completely solid (2)  Echogenicity: isoechoic (1) Given size (<1.4 cm) and appearance, this nodule does NOT meet TI-RADS criteria for biopsy or dedicated follow-up.  _________________________________________________________   No cervical lymphadenopathy.   IMPRESSION: 1. Similar appearing multinodular thyroid . 2. Similar appearance of previously biopsied right mid (labeled 2, 2.4 cm, previously 3.1 cm) and left mid (labeled 5, 2.5 cm, previously labeled 4 2.6 cm). Recommend correlation with prior biopsy results. 3. Previously visualized right inferior thyroid  nodule (labeled 3, 1.3 cm, previously 1.5 cm) and left inferior (labeled 6, 1.3 cm, previously labeled 5, 1.7 cm) now demonstrate benign characteristics (TI-RADS category 1 and TI-RADS category 3, respectively) and do not warrant additional follow-up.   The right superior  thyroid  nodule is slightly larger, possibly due to cystic component.  The rest of the nodules appear to be stable or decreased in size.  Reviewed her TFTs: Lab Results  Component Value Date   TSH 1.05 12/01/2021   TSH 1.74 10/27/2020   TSH 1.35 05/25/2019   TSH 1.070 07/28/2015   TSH 1.13 10/15/2014   Pt denies: - feeling nodules in neck - hoarseness - dysphagia - choking  She also has a  history of PCOS and distant history of epilepsy at 48 years old. She had several ED visits in 2024-2025 for various problems, including a seizure on 11/06/2023.  Glucose was not low at that time.  The potassium was low, at 2.9. She had the flu A + PNA few days prev. (Temp 105.40F!!).   ROS: Signs see HPI  I reviewed pt's medications, allergies, PMH, social hx, family hx, and changes were documented in the history of present illness. Otherwise, unchanged from my initial visit note.  Past Medical History:  Diagnosis Date   Allergic rhinitis    Breast discharge 06/05/2017   2 weeks ago left   Breast mass 12/06/2016   left   COVID-19 virus infection 11/2020   Diabetes mellitus without complication (HCC)    Dyslipidemia    Epilepsy (HCC)    Febrile seizures (HCC)    Galactorrhea    Hx gestational diabetes    Hypertension    Kidney stones    Morbid obesity (HCC)    Morbid obesity with BMI of 40.0-44.9, adult (HCC)    Obesity    Seizures (HCC)    Sleep apnea    Syncope and collapse    Tachycardia    Past  Surgical History:  Procedure Laterality Date   BREAST BIOPSY Left 2018   benign   CESAREAN SECTION     X 2   COLONOSCOPY WITH PROPOFOL  N/A 09/26/2022   Procedure: COLONOSCOPY WITH PROPOFOL ;  Surgeon: Unk Corinn Skiff, MD;  Location: [facility];  Service: Gastroenterology;  Laterality: N/A;   IRRIGATION AND DEBRIDEMENT SHOULDER Left 10/12/2020   Procedure: IRRIGATION AND DEBRIDEMENT SHOULDER;  Surgeon: Cristy Bonner DASEN, MD;  Location: [facility];  Service: Orthopedics;  Laterality: Left;   RIGHT OOPHORECTOMY Right 2001   benign tumor   TUBAL LIGATION  2007   Social History   Socioeconomic History   Marital status: Married    Spouse name: Dorn   Number of children: 2   Years of education: [employer] education level: Master's degree (e.g., MA, MS, MEng, MEd, MSW, MBA)  Occupational History   Occupation: accountant  Tobacco Use   Smoking status: Never    Smokeless tobacco: Never  Vaping Use   Vaping status: Never Used  Substance and Sexual Activity   Alcohol use: Not Currently    Comment: occ   Drug use: Not Currently   Sexual activity: Yes    Partners: Male    Birth control/protection: Surgical    Comment: tubial lig  Other Topics Concern   Not on file  Social History Narrative   ** Merged History Encounter **       Social Drivers of Health   Financial Resource Strain: Low Risk  (12/30/2023)   Overall Financial Resource Strain (CARDIA)    Difficulty of Paying Living Expenses: Not hard at all  Food Insecurity: No Food Insecurity (12/30/2023)   Hunger Vital Sign    Worried About Running Out of Food in the Last Year: Never true    Ran Out of Food in the Last Year: Never true  Transportation Needs: No Transportation Needs (12/30/2023)   PRAPARE - Administrator, Civil Service (Medical): No    Lack of Transportation (Non-Medical): No  Physical Activity: Sufficiently Active (12/30/2023)   Exercise Vital Sign    Days of Exercise per Week: 2 days    Minutes of Exercise per Session: 90 min  Stress: No Stress Concern Present (12/30/2023)   [employer] of Occupational Health - Occupational Stress Questionnaire    Feeling of Stress : Only a little  Social Connections: Unknown (12/30/2023)   Social Connection and Isolation Panel    Frequency of Communication with Friends and Family: Three times a week    Frequency of Social Gatherings with Friends and Family: Twice a week    Attends Religious Services: More than 4 times per year    Active Member of [employer] or Organizations: Yes    Attends Engineer, structural: More than 4 times per year    Marital Status: Patient declined  Intimate Partner Violence: Not At Risk (12/30/2023)   Humiliation, Afraid, Rape, and Kick questionnaire    Fear of Current or Ex-Partner: No    Emotionally Abused: No    Physically Abused: No    Sexually Abused: No   Meds: Current  Outpatient Medications on File Prior to Visit  Medication Sig Dispense Refill   Cholecalciferol (VITAMIN D ) 50 MCG (2000 UT) CAPS Take 1 capsule by mouth daily at 12 noon.     clonazePAM  (KLONOPIN ) 0.5 MG tablet Take 0.5 mg by mouth daily as needed.     Cyanocobalamin (B-12) 500 MCG SUBL Place 1 tablet under the tongue daily  at 12 noon. 100 tablet 0   EPINEPHrine  0.3 mg/0.3 mL IJ SOAJ injection Inject 0.3 mg into the muscle as needed for anaphylaxis (for anaphylaxis).      folic acid  (FOLVITE ) 1 MG tablet Take 1 tablet (1 mg total) by mouth daily. 100 tablet 1   GAVILYTE-G 236 g solution Take 4,000 mLs by mouth once.     insulin  glargine (LANTUS  SOLOSTAR) 100 UNIT/ML Solostar Pen INJECT SUBCUTANEOUSLY 40 UNITS  DAILY 45 mL 3   Insulin  Pen Needle 32G X 4 MM MISC Use 1x a day 100 each 3   ketoconazole  (NIZORAL ) 2 % cream Apply 1 Application topically daily. 120 g 0   medroxyPROGESTERone  (PROVERA ) 10 MG tablet Take 1 tablet (10 mg total) by mouth daily. Use for ten days 10 tablet 2   metFORMIN  (GLUCOPHAGE -XR) 500 MG 24 hr tablet Take 4 tablets (2,000 mg total) by mouth daily with supper. 360 tablet 3   MOUNJARO  15 MG/0.5ML Pen INJECT THE CONTENTS OF ONE PEN  SUBCUTANEOUSLY WEEKLY AS  DIRECTED 6 mL 3   OneTouch Delica Lancets 33G MISC Use 2x a day with OneTouch Verio Flex 200 each 3   ONETOUCH VERIO test strip USE 2X A DAY WITH ONETOUCH VERIO FLEX 50 strip 15   rosuvastatin  (CRESTOR ) 20 MG tablet Take 1 tablet (20 mg total) by mouth daily. 90 tablet 3   zonisamide  (ZONEGRAN ) 100 MG capsule Take 400 mg by mouth at bedtime.     No current facility-administered medications on file prior to visit.   Allergies  Allergen Reactions   Peanuts [Peanut Oil] Anaphylaxis   Aspirin Other (See Comments)    Does not take because of her epilepsy/seizure    Aspirin Other (See Comments)    Causes seizures   Other Other (See Comments)    Peanuts   Family History  Problem Relation Age of Onset   Diabetes  Mother    Healthy Father    Cancer Maternal Grandmother    Breast cancer Paternal Grandmother 11   Cancer Paternal Grandmother    Cancer Paternal Aunt    Heart disease Neg Hx    PE: There were no vitals taken for this visit. Wt Readings from Last 10 Encounters:  03/02/24 254 lb 3.2 oz (115.3 kg)  02/25/24 250 lb 6.4 oz (113.6 kg)  01/29/24 255 lb 11.2 oz (116 kg)  01/07/24 253 lb 3.2 oz (114.9 kg)  01/03/24 250 lb 6.4 oz (113.6 kg)  12/30/23 251 lb 11.2 oz (114.2 kg)  11/06/23 264 lb (119.7 kg)  07/04/23 261 lb 3.2 oz (118.5 kg)  05/22/23 261 lb 8 oz (118.6 kg)  01/25/23 262 lb 9.6 oz (119.1 kg)   Constitutional: overweight, in NAD Eyes: EOMI, no exophthalmos ENT: + B thyromegaly, no cervical lymphadenopathy Cardiovascular: RRR, No MRG Respiratory: CTA B Musculoskeletal: no deformities Skin: no rashes Neurological: no tremor with outstretched hands  ASSESSMENT: 1. DM2, insulin -dependent, uncontrolled, with complications - DR - MAU  2.  Thyroid  nodules  3. HL  PLAN:  1. Patient with longstanding, previously uncontrolled type 2 diabetes, on long-acting insulin , metformin , and GLP-1/GIP receptor agonist, with improved control at last visit.  At that time, HbA1c returned at 5.7%, lower.  We decreased her Lantus  dose as sugars were excellent.  We continued metformin  and Mounjaro .  - I suggested to:  Patient Instructions  Please continue: - Metformin  ER 2000 mg with dinner - Mounjaro  15 mg weekly - Lantus  25 units at bedtime  Please return  in 4-6 months with your sugar log.   - we checked her HbA1c: 7%  - advised to check sugars at different times of the day - 1x a day, rotating check times - advised for yearly eye exams >> she is UTD - return to clinic in 4-6 months  2.  Thyroid  nodules - Denies neck compression symptoms - Latest TSH was normal: Lab Results  Component Value Date   TSH 1.05 12/01/2021  - 2 of the nodules were biopsied in 07/2019 with benign  results - The thyroid  ultrasound reports from 09/2021 in 09/2022 showed stable nodules.  The dominant 2 nodules did need to follow-up in a year. - After last visit, we checked another thyroid  ultrasound and the nodules appeared to be approximately stable except for the right superior thyroid  nodule, which appeared to be slightly larger, likely due to the cystic component. - Will continue to follow her expectantly for now  3. HL - Latest lipid panel was reviewed from 12/2023: Elevated LDL, low HDL: Lab Results  Component Value Date   CHOL 229 (H) 12/30/2023   HDL 47 (L) 12/30/2023   LDLCALC 159 (H) 12/30/2023   TRIG 112 12/30/2023   CHOLHDL 4.9 12/30/2023  -She continues on Crestor  20 mg daily without side effects  Lela Fendt, MD PhD [facility] Endocrinology

## 2024-05-13 ENCOUNTER — Encounter: Payer: Self-pay | Admitting: Internal Medicine

## 2024-05-13 ENCOUNTER — Ambulatory Visit (INDEPENDENT_AMBULATORY_CARE_PROVIDER_SITE_OTHER): Admitting: Internal Medicine

## 2024-05-13 VITALS — BP 120/70 | HR 100 | Ht 67.0 in | Wt 245.4 lb

## 2024-05-13 DIAGNOSIS — Z794 Long term (current) use of insulin: Secondary | ICD-10-CM

## 2024-05-13 DIAGNOSIS — E1129 Type 2 diabetes mellitus with other diabetic kidney complication: Secondary | ICD-10-CM | POA: Diagnosis not present

## 2024-05-13 DIAGNOSIS — E785 Hyperlipidemia, unspecified: Secondary | ICD-10-CM | POA: Diagnosis not present

## 2024-05-13 DIAGNOSIS — R809 Proteinuria, unspecified: Secondary | ICD-10-CM

## 2024-05-13 DIAGNOSIS — E042 Nontoxic multinodular goiter: Secondary | ICD-10-CM | POA: Diagnosis not present

## 2024-05-13 LAB — POCT GLYCOSYLATED HEMOGLOBIN (HGB A1C): Hemoglobin A1C: 5.2 % (ref 4.0–5.6)

## 2024-05-13 MED ORDER — GLUCOSE BLOOD VI STRP: ORAL_STRIP | 3 refills | Status: AC

## 2024-05-13 MED ORDER — ACCU-CHEK SOFTCLIX LANCETS MISC: 3 refills | Status: AC

## 2024-05-13 MED ORDER — METFORMIN HCL ER 500 MG PO TB24: 2000.0000 mg | ORAL_TABLET | Freq: Every day | ORAL | 3 refills | Status: AC

## 2024-05-13 MED ORDER — ACCU-CHEK GUIDE W/DEVICE KIT
PACK | 0 refills | Status: AC
Start: 2024-05-13 — End: ?

## 2024-05-13 NOTE — Patient Instructions (Addendum)
 Please continue: - Metformin  ER 2000 mg with dinner - Mounjaro  15 mg weekly  Stop Lantus !  Please return in 4-6 months with your sugar log.

## 2024-05-13 NOTE — Progress Notes (Signed)
 Patient ID: Erin Good, female   DOB: 1976/08/21, 48 y.o.   MRN: [id number]  This note was precharted 05/11/2024.  HPI: Erin Good is a 48 y.o.-year-old female, initially referred by her PCP, Dr. Glenard, presenting for follow-up for DM2, dx in ~2015, now noninsulin-dependent since 2019, uncontrolled, with complications (diabetic retinopathy, microalbuminuria) and also for thyroid  nodules.  Last visit 4 months ago.  Interim history: No increased urination, blurry vision, nausea, chest pain.  She lost approximately 70 pounds after starting Mounjaro .  Reviewed HbA1c levels: Lab Results  Component Value Date   HGBA1C 5.7 (A) 01/07/2024   HGBA1C 5.9 (A) 07/04/2023   HGBA1C 5.8 (A) 01/03/2023   HGBA1C 6.2 (A) 09/04/2022   HGBA1C 6.5 (A) 04/04/2022   HGBA1C 7.8 (A) 12/01/2021   HGBA1C 9.7 (A) 09/01/2021   HGBA1C 7.6 (H) 10/03/2020   HGBA1C 7.9 (A) 08/15/2020   HGBA1C 9.0 (H) 06/30/2020   Pt was on a regimen of: - Metformin  ER 750 mg 2x a day, with meals - Soliqua  (Lantus  + Lixisenatide ) 50 units daily in am  Currently on: - Metformin  ER 750 mg 2x a day with meals >> 1500 >> 2000 mg with dinner - Lantus  50 >> 56 units at bedtime - occas. Skipping 2/2 lows >> 40 >> 30 >> 25 units daily - once a week! - Mounjaro  7.5 >> 10 >> 12.5 >> 15 mg weekly  Tried Farxiga  06/2020 >> not covered. She was previously on Mounjaro .  She is not checking blood sugars - no meter. From before: - am:  110-130 >> 87-110 >> 90-110, 120 >> 100-110 - 2h after b'fast: 166, 207 >> n/c >> 150-158 >> n/c - before lunch:   n/c >> 120-140 >> 110-140, 150 >> n/c  - 2h after lunch: 125-153 >> n/c >> 150s >> 140s >> <140 - before dinner: 70s, 120-140 >> 110-140, 150 >> 60 (flu) >> n/c - 2h after dinner: n/c >> 130-137, 180 >> <140, 160 - bedtime:170-330 >> N/c >> 76 if skips the meal >> n/c - nighttime: n/c Lowest sugar was 90 >> 100 >> ?; she has hypoglycemia awareness at 100. Highest sugar was  330 >> .SABRASABRA  180 (Covid) >> 160 >> ?[name]  Glucometer: [facility] advance  Pt's meals are: - Breakfast: 2 eggs + 3 pieces of bacon >> 2 eggs - snack: fruit - Lunch: salad - Dinner:meat + veggie + starch (rice + pasta) - Snacks: 1   -No CKD, last BUN/creatinine:  Lab Results  Component Value Date   BUN 13 12/30/2023   BUN 7 11/07/2023   CREATININE 0.90 04/22/2024   CREATININE 0.84 12/30/2023  Not on ACE inhibitor/ARB.  ACR levels were slightly elevated: Lab Results  Component Value Date   MICRALBCREAT 33 (H) 11/12/2019   MICRALBCREAT 30 (H) 01/29/2019   -+ HL; last set of lipids: Lab Results  Component Value Date   CHOL 229 (H) 12/30/2023   HDL 47 (L) 12/30/2023   LDLCALC 159 (H) 12/30/2023   TRIG 112 12/30/2023   CHOLHDL 4.9 12/30/2023  Previously on Crestor  10 mg daily and I advised her to increase the dose to 20 mg daily. She takes this now.  - last eye exam was in 2024: ? DR (need to get the report).  - no numbness and tingling in her feet.  Occasional numbness in the right leg and foot.  Foot Exam 07/04/2023.  Pt has FH of DM in mother, MGF.  Thyroid  nodules:  Reviewed previous  work-up: 06/01/2019: Thyroid  ultrasound: Parenchymal Echotexture: Mildly heterogenous Isthmus: 0.4 cm thickness Right lobe: 5 x 2.4 x 2.8 cm Left lobe: 4.5 x 2 x 2.1 cm    Estimated total number of nodules >/= 1 cm: 4   Nodule # 1: Location: Right; Mid Maximum size: 1.6 cm; Other 2 dimensions: 1.5 x 1.4 cm Composition: mixed cystic and solid (1) Echogenicity: hypoechoic (2) *Given size (>/= 1.5 - 2.4 cm) and appearance, a follow-up ultrasound in 1 year should be considered based on TI-RADS criteria. _________________________________________________________   Nodule # 2: Location: Right; Inferior Maximum size: 2.3 cm; Other 2 dimensions: 1.9 x 1.9 cm Composition: solid/almost completely solid (2) Echogenicity: hypoechoic (2)  **Given size (>/= 1.5 cm) and appearance, fine needle  aspiration of this moderately suspicious nodule should be considered based on TI-RADS criteria. _________________________________________________   Nodule # 3: Location: Left; Inferior Maximum size: 1.4 cm; Other 2 dimensions: 1.2 x 1.2 cm Composition: solid/almost completely solid (2) Echogenicity: isoechoic (1)  Given size (<1.4 cm) and appearance, this nodule does NOT meet TI-RADS criteria for biopsy or dedicated follow-up. _________________________________________________________   Nodule # 4: Location: Left; Mid Maximum size: 1.8 cm; Other 2 dimensions: 1.6 x 1.3 cm Composition: solid/almost completely solid (2) Echogenicity: hypoechoic (2) **Given size (>/= 1.5 cm) and appearance, fine needle aspiration of this moderately suspicious nodule should be considered based on TI-RADS criteria.   IMPRESSION: 1. Thyromegaly with bilateral nodules. 2. Recommend FNA biopsy of moderately suspicious 1.8 cm mid left AND 2.3 cm inferior right nodules. 3. Recommend annual/biennial ultrasound follow-up of additional nodules as above, until stability x5 years confirmed.  08/05/2019: FNA of the 2 nodules: Clinical History: Right inferior 2.3cm; Other 2 dimensions: 1.9 x 1.9cm,  Solid / almost completely solid, Hypoechoic, TI-RADS total points 4  Specimen Submitted:  A. THYROID , RLP, FINE NEEDLE ASPIRATION:  DIAGNOSIS:  - Consistent with benign follicular nodule (Bethesda category II)  SPECIMEN ADEQUACY:  Satisfactory for evaluation   Clinical History: Left mid 1.8cm; Other 2 dimensions: 1.6 x 1.3cm, Solid  / almost completely solid, Hypoechoic, TI-RADS total points 4  Specimen Submitted:  A. THYROID , LMP, FINE NEEDLE  ASPIRATION:  DIAGNOSIS:  - Consistent with benign follicular nodule (Bethesda category II)  SPECIMEN ADEQUACY:  Satisfactory for evaluation  Thyroid  U/S (09/18/2021): Parenchymal Echotexture: Mildly heterogenous Isthmus: 0.5 cm, previously 0.4 cm Right lobe: 6.1 x  2.2 x 2.8 cm, previously 5.0 x 2.4 x 2.8 cm Left lobe: 5.3 x 2.3 x 2.4 cm, previously 4.5 x 2.0 x 2.1 cm _________________________________________________________   Estimated total number of nodules >/= 1 cm: 5 _________________________________________________________   Nodule # 1: Location: Right; Superior Maximum size: 2.0 cm; Other 2 dimensions: 1.7 x 1.7 cm, previously 1.6 x 1.5 x 1.4 cm Composition: mixed cystic and solid (1) Echogenicity: isoechoic (1) This nodule does NOT meet TI-RADS criteria for biopsy or dedicated follow-up. _________________________________________________________   Nodule # 2: Location: Right; Mid Maximum size: 2.9 cm; Other 2 dimensions: 2.2 x 1.7 cm, previously 2.3 x 1.9 x 1.9 cm This nodule is mixed cystic and solid, and was previously biopsied.  _________________________________________________________   Nodule # 3: Location: Right; Inferior Maximum size: 1.5 cm; Other 2 dimensions: 1.4 x 0.9 cm Composition: solid/almost completely solid (2) Echogenicity: isoechoic (1) *Given size (>/= 1.5 - 2.4 cm) and appearance, a follow-up ultrasound in 1 year should be considered based on TI-RADS criteria. _________________________________________________________   Nodule # 4: Location: Left; Mid Maximum size: 2.5 cm; Other 2  dimensions: 2.0 x 1.5 cm, previously 1.8 x 1.6 x 1.2 cm This nodule is mixed cystic and solid, and was previously biopsied. _________________________________________________________   Nodule # 5: Location: Left; Inferior Maximum size: 1.5 cm; Other 2 dimensions: 1.2 x 1.0 cm, previously 1.4 x 1.2 x 1.2 cm Composition: solid/almost completely solid (2) Echogenicity: isoechoic (1) *Given size (>/= 1.5 - 2.4 cm) and appearance, a follow-up ultrasound in 1 year should be considered based on TI-RADS criteria.  _________________________________________________________   No cervical lymphadenopathy, within the imaged neck    IMPRESSION: 1. Multinodular thyroid  gland, with similar appearance of previously-biopsied nodules as above. 2. 1.5 cm bilateral TR 3 nodules. Attention on follow-up.  *Given size (>/= 1.5 - 2.4 cm) and appearance, a follow-up ultrasound in 1 year should be considered based on TI-RADS criteria.  Thyroid  U/S (09/06/2022): Parenchymal Echotexture: Mildly heterogenous  Isthmus: 0.4 cm  Right lobe: 6.0 x 2.3 x 3.1 cm  Left lobe: 5.1 x 2.3 x 2.4 cm  _________________________________________________________   Estimated total number of nodules >/= 1 cm: 5 _________________________________________________________   Nodule # 1:  Location: RIGHT; Superior  Maximum size: 2.1 cm; Other 2 dimensions: 1.9 x 2.5 cm, previously 2.0 x 1.7 x 1.7 cm  Composition: mixed cystic and solid (1) Echogenicity: isoechoic (1)  This nodule does NOT meet TI-RADS criteria for biopsy or dedicated follow-up.  _________________________________________________________   Nodule # 2:  Location: RIGHT; Mid/inferior  Maximum size: 3.1 cm; Other 2 dimensions: 2.5 x 1.9 cm  No aggressive features on today's evaluation. This nodule appears morphologically stable for and was previously biopsied in 08/05/2019. Assuming a benign pathologic diagnosis, repeat sampling and/or dedicated follow-up is not recommended.  _________________________________________________________   Nodule # 3:  Location: RIGHT; Inferior  Maximum size: 1.5 cm; Other 2 dimensions: 1.4 x 1.3 cm, previously 1.5 x 1.4 x 0.9 cm  Composition: solid/almost completely solid (2)  Echogenicity: isoechoic (1) *Given size (>/= 1.5 - 2.4 cm) and appearance, a follow-up ultrasound in 1 year should be considered based on TI-RADS criteria.  _________________________________________________________   Nodule # 4:  Location: LEFT; Mid  Maximum size: 2.6 cm; Other 2 dimensions: 2.2 x 2.2 cm, previously 2.5 x 2.0 x 1.5 cm  No aggressive features on today's  evaluation. This nodule appears morphologically stable and was previously biopsied in 08/05/2019. Assuming a benign pathologic diagnosis, repeat sampling and/or dedicated follow-up is not recommended.  _________________________________________________________   Nodule # 5:  Location: LEFT; Inferior  Maximum size: 1.7 cm; Other 2 dimensions: 1.6 x 1.1 cm, previously 1.5 x 1.2 x 1.0 cm  Composition: solid/almost completely solid (2)  Echogenicity: isoechoic (1) *Given size (>/= 1.5 - 2.4 cm) and appearance, a follow-up ultrasound in 1 year should be considered based on TI-RADS criteria.  _________________________________________________________   No cervical adenopathy or abnormal fluid collection within the imaged neck.   IMPRESSION: 1. Multinodular thyroid  gland. 2. 1.5 cm bilateral inferior TR-3 thyroid  nodules. A follow-up ultrasound in 1 year should be considered based on TI-RADS criteria. 3. Additional smaller and previously-biopsied nodules, as described above, without follow-up nor biopsy indicated per current criteria.  Thyroid  U/S (01/07/2024): Parenchymal Echotexture: Moderately heterogeneous  Isthmus: 0.5 cm ,previously 0.46 cm  Right lobe: 6.2 x 3.1 x 2.9 cm ,previously 6.0 x 2 point cm  Left lobe: 5.1 x 2.1 x 2.1 cm ,previously 5.1 x 2.3 x 2.4 cm  ________________________________________________________   Estimated total number of nodules >/= 1 cm: 5 _________________________________________________________   Slight interval  enlargement however persistent benign appearance of solid cystic nodule in the right superior thyroid  (labeled 1, 2.6 cm, previously 2.1 cm).   Similar appearance of previously biopsied solid cystic nodule in the right mid thyroid  (labeled 2, 2.4 cm, previously 3.1 cm).   Nodule # 3:  Prior biopsy: No  Location: Right; Inferior  Maximum size: 1.3 cm; Other 2 dimensions: 1.0 x 1.0 cm, previously, 1.5 x 1.3 x 1.4 cm  Composition: spongiform  (0)  Echogenicity: isoechoic (1) Change in features: Yes This nodule does NOT meet TI-RADS criteria for biopsy or dedicated follow-up.  _________________________________________________________   There is subcentimeter pseudo nodularity about the left superior thyroid  which does not require follow-up.   Similar appearance of previously biopsied solid cystic nodule in the left mid thyroid  (labeled 5, 2.5 cm, previously labeled 4, 2.6 cm).   Ill-defined spongiform like pseudo nodularity about the posterior left mid thyroid  which is not require additional follow-up.   Nodule # 6, previously labeled 5:  Prior biopsy: No  Location: Left; Inferior   Maximum size: 1.3 cm; Other 2 dimensions: 1.1 x 1.0 cm, previously, 1.7 x 1.6 x 1.1 cm  Composition: solid/almost completely solid (2)  Echogenicity: isoechoic (1) Given size (<1.4 cm) and appearance, this nodule does NOT meet TI-RADS criteria for biopsy or dedicated follow-up.  _________________________________________________________   No cervical lymphadenopathy.   IMPRESSION: 1. Similar appearing multinodular thyroid . 2. Similar appearance of previously biopsied right mid (labeled 2, 2.4 cm, previously 3.1 cm) and left mid (labeled 5, 2.5 cm, previously labeled 4 2.6 cm). Recommend correlation with prior biopsy results. 3. Previously visualized right inferior thyroid  nodule (labeled 3, 1.3 cm, previously 1.5 cm) and left inferior (labeled 6, 1.3 cm, previously labeled 5, 1.7 cm) now demonstrate benign characteristics (TI-RADS category 1 and TI-RADS category 3, respectively) and do not warrant additional follow-up.   The right superior  thyroid  nodule is slightly larger, possibly due to cystic component.  The rest of the nodules appear to be stable or decreased in size.  Reviewed her TFTs: Lab Results  Component Value Date   TSH 1.05 12/01/2021   TSH 1.74 10/27/2020   TSH 1.35 05/25/2019   TSH 1.070 07/28/2015   TSH 1.13  10/15/2014   Pt denies: - feeling nodules in neck - hoarseness - dysphagia - choking  She also has a history of PCOS and distant history of epilepsy at 48 years old. She had several ED visits in 2024-2025 for various problems, including a seizure on 11/06/2023.  Glucose was not low at that time.  The potassium was low, at 2.9. She had the flu A + PNA few days prev. (Temp 105.70F!!).   ROS: Signs see HPI  I reviewed pt's medications, allergies, PMH, social hx, family hx, and changes were documented in the history of present illness. Otherwise, unchanged from my initial visit note.  Past Medical History:  Diagnosis Date   Allergic rhinitis    Breast discharge 06/05/2017   2 weeks ago left   Breast mass 12/06/2016   left   COVID-19 virus infection 11/2020   Diabetes mellitus without complication (HCC)    Dyslipidemia    Epilepsy (HCC)    Febrile seizures (HCC)    Galactorrhea    Hx gestational diabetes    Hypertension    Kidney stones    Morbid obesity (HCC)    Morbid obesity with BMI of 40.0-44.9, adult (HCC)    Obesity    Seizures (HCC)    Sleep  apnea    Syncope and collapse    Tachycardia    Past Surgical History:  Procedure Laterality Date   BREAST BIOPSY Left 2018   benign   CESAREAN SECTION     X 2   COLONOSCOPY WITH PROPOFOL  N/A 09/26/2022   Procedure: COLONOSCOPY WITH PROPOFOL ;  Surgeon: Unk Corinn Skiff, MD;  Location: [facility];  Service: Gastroenterology;  Laterality: N/A;   IRRIGATION AND DEBRIDEMENT SHOULDER Left 10/12/2020   Procedure: IRRIGATION AND DEBRIDEMENT SHOULDER;  Surgeon: Cristy Bonner DASEN, MD;  Location: [facility];  Service: Orthopedics;  Laterality: Left;   RIGHT OOPHORECTOMY Right 2001   benign tumor   TUBAL LIGATION  2007   Social History   Socioeconomic History   Marital status: Married    Spouse name: Dorn   Number of children: 2   Years of education: [employer] education level: Master's degree (e.g., MA, MS, MEng, MEd,  MSW, MBA)  Occupational History   Occupation: accountant  Tobacco Use   Smoking status: Never   Smokeless tobacco: Never  Vaping Use   Vaping status: Never Used  Substance and Sexual Activity   Alcohol use: Not Currently    Comment: occ   Drug use: Not Currently   Sexual activity: Yes    Partners: Male    Birth control/protection: Surgical    Comment: tubial lig  Other Topics Concern   Not on file  Social History Narrative   ** Merged History Encounter **       Social Drivers of Health   Financial Resource Strain: Low Risk  (12/30/2023)   Overall Financial Resource Strain (CARDIA)    Difficulty of Paying Living Expenses: Not hard at all  Food Insecurity: No Food Insecurity (12/30/2023)   Hunger Vital Sign    Worried About Running Out of Food in the Last Year: Never true    Ran Out of Food in the Last Year: Never true  Transportation Needs: No Transportation Needs (12/30/2023)   PRAPARE - Administrator, Civil Service (Medical): No    Lack of Transportation (Non-Medical): No  Physical Activity: Sufficiently Active (12/30/2023)   Exercise Vital Sign    Days of Exercise per Week: 2 days    Minutes of Exercise per Session: 90 min  Stress: No Stress Concern Present (12/30/2023)   [employer] of Occupational Health - Occupational Stress Questionnaire    Feeling of Stress : Only a little  Social Connections: Unknown (12/30/2023)   Social Connection and Isolation Panel    Frequency of Communication with Friends and Family: Three times a week    Frequency of Social Gatherings with Friends and Family: Twice a week    Attends Religious Services: More than 4 times per year    Active Member of [employer] or Organizations: Yes    Attends Engineer, structural: More than 4 times per year    Marital Status: Patient declined  Intimate Partner Violence: Not At Risk (12/30/2023)   Humiliation, Afraid, Rape, and Kick questionnaire    Fear of Current or Ex-Partner: No     Emotionally Abused: No    Physically Abused: No    Sexually Abused: No   Meds: Current Outpatient Medications on File Prior to Visit  Medication Sig Dispense Refill   Cholecalciferol (VITAMIN D ) 50 MCG (2000 UT) CAPS Take 1 capsule by mouth daily at 12 noon.     clonazePAM  (KLONOPIN ) 0.5 MG tablet Take 0.5 mg by mouth daily as needed.  Cyanocobalamin (B-12) 500 MCG SUBL Place 1 tablet under the tongue daily at 12 noon. 100 tablet 0   EPINEPHrine  0.3 mg/0.3 mL IJ SOAJ injection Inject 0.3 mg into the muscle as needed for anaphylaxis (for anaphylaxis).      folic acid  (FOLVITE ) 1 MG tablet Take 1 tablet (1 mg total) by mouth daily. 100 tablet 1   GAVILYTE-G 236 g solution Take 4,000 mLs by mouth once.     insulin  glargine (LANTUS  SOLOSTAR) 100 UNIT/ML Solostar Pen INJECT SUBCUTANEOUSLY 40 UNITS  DAILY 45 mL 3   Insulin  Pen Needle 32G X 4 MM MISC Use 1x a day 100 each 3   ketoconazole  (NIZORAL ) 2 % cream Apply 1 Application topically daily. 120 g 0   medroxyPROGESTERone  (PROVERA ) 10 MG tablet Take 1 tablet (10 mg total) by mouth daily. Use for ten days 10 tablet 2   metFORMIN  (GLUCOPHAGE -XR) 500 MG 24 hr tablet Take 4 tablets (2,000 mg total) by mouth daily with supper. 360 tablet 3   MOUNJARO  15 MG/0.5ML Pen INJECT THE CONTENTS OF ONE PEN  SUBCUTANEOUSLY WEEKLY AS  DIRECTED 6 mL 3   OneTouch Delica Lancets 33G MISC Use 2x a day with OneTouch Verio Flex 200 each 3   ONETOUCH VERIO test strip USE 2X A DAY WITH ONETOUCH VERIO FLEX 50 strip 15   rosuvastatin  (CRESTOR ) 20 MG tablet Take 1 tablet (20 mg total) by mouth daily. 90 tablet 3   zonisamide  (ZONEGRAN ) 100 MG capsule Take 400 mg by mouth at bedtime.     No current facility-administered medications on file prior to visit.   Allergies  Allergen Reactions   Peanuts [Peanut Oil] Anaphylaxis   Aspirin Other (See Comments)    Does not take because of her epilepsy/seizure    Aspirin Other (See Comments)    Causes seizures   Other Other  (See Comments)    Peanuts   Family History  Problem Relation Age of Onset   Diabetes Mother    Healthy Father    Cancer Maternal Grandmother    Breast cancer Paternal Grandmother 65   Cancer Paternal Grandmother    Cancer Paternal Aunt    Heart disease Neg Hx    PE: BP 120/70   Pulse 100   Ht 5' 7 (1.702 m)   Wt 245 lb 6.4 oz (111.3 kg)   SpO2 98%   BMI 38.44 kg/m  Wt Readings from Last 10 Encounters:  05/13/24 245 lb 6.4 oz (111.3 kg)  03/02/24 254 lb 3.2 oz (115.3 kg)  02/25/24 250 lb 6.4 oz (113.6 kg)  01/29/24 255 lb 11.2 oz (116 kg)  01/07/24 253 lb 3.2 oz (114.9 kg)  01/03/24 250 lb 6.4 oz (113.6 kg)  12/30/23 251 lb 11.2 oz (114.2 kg)  11/06/23 264 lb (119.7 kg)  07/04/23 261 lb 3.2 oz (118.5 kg)  05/22/23 261 lb 8 oz (118.6 kg)   Constitutional: overweight, in NAD Eyes: EOMI, no exophthalmos ENT: + B thyromegaly, no cervical lymphadenopathy Cardiovascular: Tachycardia, RR, No MRG Respiratory: CTA B Musculoskeletal: no deformities Skin: no rashes Neurological: no tremor with outstretched hands Diabetic Foot Exam - Simple   Simple Foot Form Diabetic Foot exam was performed with the following findings: Yes 05/13/2024  9:05 AM  Visual Inspection No deformities, no ulcerations, no other skin breakdown bilaterally: Yes Sensation Testing Intact to touch and monofilament testing bilaterally: Yes Pulse Check Posterior Tibialis and Dorsalis pulse intact bilaterally: Yes Comments    ASSESSMENT: 1. DM2, now noninsulin-dependent,  uncontrolled, with complications - DR - MAU  2.  Thyroid  nodules  3. HL  PLAN:  1. Patient with longstanding, previously uncontrolled type 2 diabetes, on long-acting insulin , metformin , and GLP-1/GIP receptor agonist, with improved control at last visit.  At that time, HbA1c returned at 5.7%, lower.  We decreased her Lantus  dose as sugars were excellent.  We continued metformin  and Mounjaro . -At today's visit, she is very vague about  the dose of insulin  that she is taking.  Upon further questioning, she is taking the insulin  approximately once a week.  Therefore, we will go ahead and stop the insulin .  However, she is not checking blood sugars and I strongly advised her to start, especially as we are stopping insulin  and she may see higher blood sugars.  I did call another meter and supplies in to her pharmacy.  For now we will continue the same dose of metformin  (refilled today) and Mounjaro .   - I suggested to:  Patient Instructions  Please continue: - Metformin  ER 2000 mg with dinner - Mounjaro  15 mg weekly  Stop Lantus !  Please return in 4-6 months with your sugar log.   - we checked her HbA1c: 5.2% (lower) - advised to check sugars at different times of the day - 1x a day, rotating check times - advised for yearly eye exams >> she is UTD - she had microalbuminuria before-will check another ACR today - return to clinic in 4-6 months  2.  Thyroid  nodules - Denies neck compression symptoms - Latest TSH was normal: Lab Results  Component Value Date   TSH 1.05 12/01/2021  - 2 of the nodules were biopsied in 07/2019 with benign results - The thyroid  ultrasound reports from 09/2021 in 09/2022 showed stable nodules.  The dominant 2 nodules did need to follow-up in a year. - After last visit, we checked another thyroid  ultrasound and the nodules appeared to be approximately stable except for the right superior thyroid  nodule, which appeared to be slightly larger, likely due to the cystic component. - Will continue to follow her expectantly for now  3. HL - LDL level from 12/2023 was elevated, HDL was low: Lab Results  Component Value Date   CHOL 229 (H) 12/30/2023   HDL 47 (L) 12/30/2023   LDLCALC 159 (H) 12/30/2023   TRIG 112 12/30/2023   CHOLHDL 4.9 12/30/2023  -She continues on Crestor  20 mg daily without side effects  Lela Fendt, MD PhD [facility] Endocrinology

## 2024-05-13 NOTE — Addendum Note (Signed)
 Addended by: CLEOTILDE ROLIN RAMAN on: 05/13/2024 09:41 AM   Modules accepted: Orders

## 2024-05-14 ENCOUNTER — Ambulatory Visit: Payer: Self-pay | Admitting: Internal Medicine

## 2024-05-14 LAB — MICROALBUMIN / CREATININE URINE RATIO
Creatinine, Urine: 104 mg/dL (ref 20–275)
Microalb Creat Ratio: 12 mg/g{creat} (ref <30)
Microalb, Ur: 1.2 mg/dL

## 2024-05-18 ENCOUNTER — Other Ambulatory Visit: Payer: Self-pay | Admitting: Surgery

## 2024-05-18 DIAGNOSIS — G8929 Other chronic pain: Secondary | ICD-10-CM

## 2024-05-18 DIAGNOSIS — N83201 Unspecified ovarian cyst, right side: Secondary | ICD-10-CM

## 2024-05-20 ENCOUNTER — Ambulatory Visit
Admission: RE | Admit: 2024-05-20 | Discharge: 2024-05-20 | Disposition: A | Discharge status: Home / Self Care | Source: Ambulatory Visit | Attending: Surgery | Admitting: Surgery

## 2024-05-20 DIAGNOSIS — R1032 Left lower quadrant pain: Secondary | ICD-10-CM | POA: Insufficient documentation

## 2024-05-20 DIAGNOSIS — N83201 Unspecified ovarian cyst, right side: Secondary | ICD-10-CM | POA: Diagnosis present

## 2024-05-20 DIAGNOSIS — R1031 Right lower quadrant pain: Secondary | ICD-10-CM | POA: Insufficient documentation

## 2024-05-20 DIAGNOSIS — G8929 Other chronic pain: Secondary | ICD-10-CM | POA: Insufficient documentation

## 2024-06-01 ENCOUNTER — Encounter: Payer: Self-pay | Admitting: Plastic Surgery

## 2024-06-01 ENCOUNTER — Ambulatory Visit (INDEPENDENT_AMBULATORY_CARE_PROVIDER_SITE_OTHER): Admitting: Plastic Surgery

## 2024-06-01 VITALS — BP 139/89 | HR 77 | Ht 67.0 in | Wt 242.0 lb

## 2024-06-01 DIAGNOSIS — M542 Cervicalgia: Secondary | ICD-10-CM | POA: Diagnosis not present

## 2024-06-01 DIAGNOSIS — M546 Pain in thoracic spine: Secondary | ICD-10-CM | POA: Diagnosis not present

## 2024-06-01 DIAGNOSIS — Z794 Long term (current) use of insulin: Secondary | ICD-10-CM

## 2024-06-01 DIAGNOSIS — M549 Dorsalgia, unspecified: Secondary | ICD-10-CM | POA: Insufficient documentation

## 2024-06-01 DIAGNOSIS — G8929 Other chronic pain: Secondary | ICD-10-CM

## 2024-06-01 DIAGNOSIS — E785 Hyperlipidemia, unspecified: Secondary | ICD-10-CM

## 2024-06-01 DIAGNOSIS — M793 Panniculitis, unspecified: Secondary | ICD-10-CM | POA: Diagnosis not present

## 2024-06-01 DIAGNOSIS — E66813 Obesity, class 3: Secondary | ICD-10-CM

## 2024-06-01 DIAGNOSIS — G510 Bell's palsy: Secondary | ICD-10-CM

## 2024-06-01 NOTE — Progress Notes (Signed)
 Patient ID: Erin Good, female    DOB: 1976/06/01, 48 y.o.   MRN: [id number]   Chief Complaint  Patient presents with   Consult    The patient is a 48 year old female here for a consultation for panniculectomy.  She is 5 feet 7 inches tall and weighs 242 pounds.  She was 360 pounds prior to starting Mounjaro  and metformin .  She is being treated for diabetes.  She is not a smoker and not on blood thinners.  She has had 2 C-sections in the past and has healed from those.  She also had her wisdom teeth removed without difficulties.  She complains of back and neck pain and skin breakdown between her folds.  She is interested in having the excess tissue of her abdomen removed.  I do not feel a hernia today.  Patient said that at 1 time there was thought that she might have had 1 but in her last exam they did not feel it.  She has a history of epilepsy, hypertension, morbid obesity and sleep apnea.    Review of Systems  Constitutional:  Positive for activity change. Negative for appetite change.  Eyes: Negative.   Respiratory: Negative.    Cardiovascular: Negative.   Gastrointestinal: Negative.   Genitourinary: Negative.   Musculoskeletal:  Positive for back pain and neck pain.  Skin:  Positive for rash.    Past Medical History:  Diagnosis Date   Allergic rhinitis    Breast discharge 06/05/2017   2 weeks ago left   Breast mass 12/06/2016   left   COVID-19 virus infection 11/2020   Diabetes mellitus without complication (HCC)    Dyslipidemia    Epilepsy (HCC)    Febrile seizures (HCC)    Galactorrhea    Hx gestational diabetes    Hypertension    Kidney stones    Morbid obesity (HCC)    Morbid obesity with BMI of 40.0-44.9, adult (HCC)    Obesity    Seizures (HCC)    Sleep apnea    Syncope and collapse    Tachycardia     Past Surgical History:  Procedure Laterality Date   BREAST BIOPSY Left 2018   benign   CESAREAN SECTION     X 2   COLONOSCOPY WITH  PROPOFOL  N/A 09/26/2022   Procedure: COLONOSCOPY WITH PROPOFOL ;  Surgeon: Unk Corinn Skiff, MD;  Location: [facility];  Service: Gastroenterology;  Laterality: N/A;   IRRIGATION AND DEBRIDEMENT SHOULDER Left 10/12/2020   Procedure: IRRIGATION AND DEBRIDEMENT SHOULDER;  Surgeon: Cristy Bonner DASEN, MD;  Location: [facility];  Service: Orthopedics;  Laterality: Left;   RIGHT OOPHORECTOMY Right 2001   benign tumor   TUBAL LIGATION  2007      Current Outpatient Medications:    Accu-Chek Softclix Lancets lancets, Use as instructed 1x a day, Disp: 100 each, Rfl: 3   Blood Glucose Monitoring Suppl (ACCU-CHEK GUIDE) w/Device KIT, Use as advised, Disp: 1 kit, Rfl: 0   Cholecalciferol (VITAMIN D ) 50 MCG (2000 UT) CAPS, Take 1 capsule by mouth daily at 12 noon. (Patient not taking: Reported on 06/01/2024), Disp: , Rfl:    clonazePAM  (KLONOPIN ) 0.5 MG tablet, Take 0.5 mg by mouth daily as needed., Disp: , Rfl:    Cyanocobalamin (B-12) 500 MCG SUBL, Place 1 tablet under the tongue daily at 12 noon., Disp: 100 tablet, Rfl: 0   EPINEPHrine  0.3 mg/0.3 mL IJ SOAJ injection, Inject 0.3 mg into the muscle as needed for anaphylaxis (  for anaphylaxis). , Disp: , Rfl:    folic acid  (FOLVITE ) 1 MG tablet, Take 1 tablet (1 mg total) by mouth daily., Disp: 100 tablet, Rfl: 1   GAVILYTE-G 236 g solution, Take 4,000 mLs by mouth once., Disp: , Rfl:    glucose blood test strip, Use as instructed 1x a day, Disp: 100 each, Rfl: 3   Insulin  Pen Needle 32G X 4 MM MISC, Use 1x a day, Disp: 100 each, Rfl: 3   ketoconazole  (NIZORAL ) 2 % cream, Apply 1 Application topically daily., Disp: 120 g, Rfl: 0   medroxyPROGESTERone  (PROVERA ) 10 MG tablet, Take 1 tablet (10 mg total) by mouth daily. Use for ten days (Patient not taking: Reported on 05/13/2024), Disp: 10 tablet, Rfl: 2   metFORMIN  (GLUCOPHAGE -XR) 500 MG 24 hr tablet, Take 4 tablets (2,000 mg total) by mouth daily with supper., Disp: 360 tablet, Rfl: 3   MOUNJARO  15 MG/0.5ML  Pen, INJECT THE CONTENTS OF ONE PEN  SUBCUTANEOUSLY WEEKLY AS  DIRECTED, Disp: 6 mL, Rfl: 3   rosuvastatin  (CRESTOR ) 20 MG tablet, Take 1 tablet (20 mg total) by mouth daily., Disp: 90 tablet, Rfl: 3   zonisamide  (ZONEGRAN ) 100 MG capsule, Take 400 mg by mouth at bedtime., Disp: , Rfl:    Objective:   Vitals:   06/01/24 1020  BP: 139/89  Pulse: 77  SpO2: 100%    Physical Exam Vitals reviewed.  Constitutional:      Appearance: Normal appearance.  HENT:     Head: Atraumatic.  Cardiovascular:     Rate and Rhythm: Normal rate.     Pulses: Normal pulses.  Pulmonary:     Effort: Pulmonary effort is normal.  Abdominal:     Palpations: Abdomen is soft.  Skin:    General: Skin is warm.     Capillary Refill: Capillary refill takes less than 2 seconds.  Neurological:     Mental Status: She is alert and oriented to person, place, and time.  Psychiatric:        Mood and Affect: Mood normal.        Behavior: Behavior normal.        Thought Content: Thought content normal.        Judgment: Judgment normal.     Assessment & Plan:  Obesity, Class III, BMI 40-49.9 (morbid obesity)  Dyslipidemia  Bell's palsy  Type 2 diabetes mellitus with diabetic microalbuminuria, with long-term current use of insulin  (HCC)  Chronic bilateral thoracic back pain  Panniculitis  The patient is a candidate for panniculectomy.  She may want to add on the upper abdomen.  I can send her a quote for that.  I did make it clear that the girth of the upper abdomen would not change and that something that weight reduction would need to take care of.  Pictures were obtained of the patient and placed in the chart with the patient's or guardian's permission.   Erin RAMAN Nanako Stopher, DO

## 2024-06-02 ENCOUNTER — Telehealth: Payer: Self-pay

## 2024-06-02 NOTE — Telephone Encounter (Signed)
-----   Message from Erin Good sent at 06/01/2024 11:00 AM EDT ----- Will need PCP clearance due to her seizures

## 2024-06-02 NOTE — Telephone Encounter (Signed)
 Faxed surgical clearance to pcp, Dorette Loron, MD. Received fax success confirmation. Surgical spreadsheet updated.

## 2024-06-16 NOTE — Progress Notes (Signed)
 Patient ID: Erin Good, female    DOB: [birth year], 48 y.o.   MRN: [id number]  Chief Complaint  Patient presents with   Pre-op Exam      ICD-10-CM   1. Panniculitis  M79.3        History of Present Illness: Erin Good is a 48 y.o.  female  with a history of recurrent panniculitis.  She presents for preoperative evaluation for upcoming procedure, panniculectomy, scheduled for 07/08/2024 with Dr. Lowery.  The patient has not had problems with anesthesia.  Recent left rotator cuff surgery without complication from anesthesia.  She denies any personal or family history of blood clots or clotting disorder.  She denies any personal history of cardiac or pulmonary disease, cancer, varicosities, inflammatory bowel disease, or nicotine use.  She takes an oral progesterone  only pill.  She takes zonisamide  for epilepsy.  Clearance from primary care provider sent from Dr. Lowery at time of consult is still pending.  Patient reports that she has an appointment with PCP in 1 week.  Hold any vitamins and supplements 1 week prior to surgery.  She will also hold her Mounjaro  the week leading up to surgery.  Discussed expectations and risks of panniculectomy and she is agreeable to proceed.  Summary of Previous Visit: She met with Dr. Lowery for consult 06/01/2024.  At that time, she reportedly had lost approximately 120 pounds after initiating metformin  and Mounjaro .  She has had multiple C-sections in the past.  Complained of recurrent panniculitis.  No hernia palpated on exam.  Discussed panniculectomy and patient was agreeable to proceed.  States that we need PCP clearance due to her history of seizure disorder.  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  Denies need for any FMLA/STD.  Planning 2 weeks off from work completely.  PMH Significant for: Obesity with extreme weight loss, recurrent panniculitis, T2DM with well-controlled A1c most recently 5.2%, OSA,  HTN.   Past Medical History: Allergies: Allergies  Allergen Reactions   Peanuts [Peanut Oil] Anaphylaxis   Aspirin Other (See Comments)    Does not take because of her epilepsy/seizure    Aspirin Other (See Comments)    Causes seizures   Other Other (See Comments)    Peanuts    Current Medications:  Current Outpatient Medications:    Accu-Chek Softclix Lancets lancets, Use as instructed 1x a day, Disp: 100 each, Rfl: 3   Blood Glucose Monitoring Suppl (ACCU-CHEK GUIDE) w/Device KIT, Use as advised, Disp: 1 kit, Rfl: 0   cephALEXin  (KEFLEX ) 500 MG capsule, Take 1 capsule (500 mg total) by mouth 4 (four) times daily for 3 days., Disp: 12 capsule, Rfl: 0   Cholecalciferol (VITAMIN D ) 50 MCG (2000 UT) CAPS, Take 1 capsule by mouth daily at 12 noon., Disp: , Rfl:    clonazePAM  (KLONOPIN ) 0.5 MG tablet, Take 0.5 mg by mouth daily as needed., Disp: , Rfl:    Cyanocobalamin (B-12) 500 MCG SUBL, Place 1 tablet under the tongue daily at 12 noon., Disp: 100 tablet, Rfl: 0   EPINEPHrine  0.3 mg/0.3 mL IJ SOAJ injection, Inject 0.3 mg into the muscle as needed for anaphylaxis (for anaphylaxis). , Disp: , Rfl:    folic acid  (FOLVITE ) 1 MG tablet, Take 1 tablet (1 mg total) by mouth daily., Disp: 100 tablet, Rfl: 1   GAVILYTE-G 236 g solution, Take 4,000 mLs by mouth once., Disp: , Rfl:    glucose blood test strip, Use as instructed 1x a day,  Disp: 100 each, Rfl: 3   Insulin  Pen Needle 32G X 4 MM MISC, Use 1x a day, Disp: 100 each, Rfl: 3   ketoconazole  (NIZORAL ) 2 % cream, Apply 1 Application topically daily., Disp: 120 g, Rfl: 0   medroxyPROGESTERone  (PROVERA ) 10 MG tablet, Take 1 tablet (10 mg total) by mouth daily. Use for ten days, Disp: 10 tablet, Rfl: 2   metFORMIN  (GLUCOPHAGE -XR) 500 MG 24 hr tablet, Take 4 tablets (2,000 mg total) by mouth daily with supper., Disp: 360 tablet, Rfl: 3   MOUNJARO  15 MG/0.5ML Pen, INJECT THE CONTENTS OF ONE PEN  SUBCUTANEOUSLY WEEKLY AS  DIRECTED, Disp: 6 mL,  Rfl: 3   ondansetron  (ZOFRAN -ODT) 4 MG disintegrating tablet, Take 1 tablet (4 mg total) by mouth every 8 (eight) hours as needed for nausea or vomiting., Disp: 20 tablet, Rfl: 0   oxyCODONE  (ROXICODONE ) 5 MG immediate release tablet, Take 1 tablet (5 mg total) by mouth every 8 (eight) hours as needed for up to 5 days for severe pain (pain score 7-10)., Disp: 15 tablet, Rfl: 0   rosuvastatin  (CRESTOR ) 20 MG tablet, Take 1 tablet (20 mg total) by mouth daily., Disp: 90 tablet, Rfl: 3   zonisamide  (ZONEGRAN ) 100 MG capsule, Take 400 mg by mouth at bedtime., Disp: , Rfl:   Past Medical Problems: Past Medical History:  Diagnosis Date   Allergic rhinitis    Breast discharge 06/05/2017   2 weeks ago left   Breast mass 12/06/2016   left   COVID-19 virus infection 11/2020   Diabetes mellitus without complication (HCC)    Dyslipidemia    Epilepsy (HCC)    Febrile seizures (HCC)    Galactorrhea    Hx gestational diabetes    Hypertension    Kidney stones    Morbid obesity (HCC)    Morbid obesity with BMI of 40.0-44.9, adult (HCC)    Obesity    Seizures (HCC)    Sleep apnea    Syncope and collapse    Tachycardia     Past Surgical History: Past Surgical History:  Procedure Laterality Date   BREAST BIOPSY Left 2018   benign   CESAREAN SECTION     X 2   COLONOSCOPY WITH PROPOFOL  N/A 09/26/2022   Procedure: COLONOSCOPY WITH PROPOFOL ;  Surgeon: Unk Corinn Skiff, MD;  Location: [facility];  Service: Gastroenterology;  Laterality: N/A;   IRRIGATION AND DEBRIDEMENT SHOULDER Left 10/12/2020   Procedure: IRRIGATION AND DEBRIDEMENT SHOULDER;  Surgeon: Cristy Bonner DASEN, MD;  Location: [facility];  Service: Orthopedics;  Laterality: Left;   RIGHT OOPHORECTOMY Right 2001   benign tumor   TUBAL LIGATION  2007    Social History: Social History   Socioeconomic History   Marital status: Married    Spouse name: Dorn   Number of children: 2   Years of education: [employer]  education level: Master's degree (e.g., MA, MS, MEng, MEd, MSW, MBA)  Occupational History   Occupation: accountant  Tobacco Use   Smoking status: Never   Smokeless tobacco: Never  Vaping Use   Vaping status: Never Used  Substance and Sexual Activity   Alcohol use: Not Currently    Comment: occ   Drug use: Not Currently   Sexual activity: Yes    Partners: Male    Birth control/protection: Surgical    Comment: tubial lig  Other Topics Concern   Not on file  Social History Narrative   ** Merged History Encounter **  Social Drivers of Corporate investment banker Strain: Low Risk  (12/30/2023)   Overall Financial Resource Strain (CARDIA)    Difficulty of Paying Living Expenses: Not hard at all  Food Insecurity: No Food Insecurity (12/30/2023)   Hunger Vital Sign    Worried About Running Out of Food in the Last Year: Never true    Ran Out of Food in the Last Year: Never true  Transportation Needs: No Transportation Needs (12/30/2023)   PRAPARE - Administrator, Civil Service (Medical): No    Lack of Transportation (Non-Medical): No  Physical Activity: Sufficiently Active (12/30/2023)   Exercise Vital Sign    Days of Exercise per Week: 2 days    Minutes of Exercise per Session: 90 min  Stress: No Stress Concern Present (12/30/2023)   [employer] of Occupational Health - Occupational Stress Questionnaire    Feeling of Stress : Only a little  Social Connections: Unknown (12/30/2023)   Social Connection and Isolation Panel    Frequency of Communication with Friends and Family: Three times a week    Frequency of Social Gatherings with Friends and Family: Twice a week    Attends Religious Services: More than 4 times per year    Active Member of [employer] or Organizations: Yes    Attends Engineer, structural: More than 4 times per year    Marital Status: Patient declined  Intimate Partner Violence: Not At Risk (12/30/2023)   Humiliation, Afraid, Rape, and  Kick questionnaire    Fear of Current or Ex-Partner: No    Emotionally Abused: No    Physically Abused: No    Sexually Abused: No    Family History: Family History  Problem Relation Age of Onset   Diabetes Mother    Healthy Father    Cancer Maternal Grandmother    Breast cancer Paternal Grandmother 4   Cancer Paternal Grandmother    Cancer Paternal Aunt    Heart disease Neg Hx     Review of Systems: ROS Denies any recent chest pain, difficulty breathing, leg swelling, fevers.  Physical Exam: Vital Signs BP 137/76 (BP Location: Left Arm, Patient Position: Sitting, Cuff Size: Large)   Pulse 88   Ht 5' 7 (1.702 m)   Wt 245 lb 3.2 oz (111.2 kg)   SpO2 100%   BMI 38.40 kg/m   Physical Exam Constitutional:      General: Not in acute distress.    Appearance: Normal appearance. Not ill-appearing.  HENT:     Head: Normocephalic and atraumatic.  Eyes:     Pupils: Pupils are equal, round. Cardiovascular:     Rate and Rhythm: Normal rate.    Pulses: Normal pulses.  Pulmonary:     Effort: No respiratory distress or increased work of breathing.  Speaks in full sentences. Abdominal:     General: Abdomen is flat. No distension.   Musculoskeletal: Normal range of motion. No lower extremity swelling or edema. No varicosities. Skin:    General: Skin is warm and dry.     Findings: No erythema or rash.  Neurological:     Mental Status: Alert and oriented to person, place, and time.  Psychiatric:        Mood and Affect: Mood normal.        Behavior: Behavior normal.    Assessment/Plan: The patient is scheduled for panniculectomy with Dr. Lowery.  Risks, benefits, and alternatives of procedure discussed, questions answered and consent obtained.    Smoking Status:  Non-smoker.  Caprini Score: 4; Risk Factors include: Age, BMI greater than 25, and length of planned surgery. Recommendation for mechanical prophylaxis. Encourage early ambulation.   Pictures obtained:  06/01/2024  Post-op Rx sent to pharmacy: Oxycodone , Zofran , Keflex .  Patient was provided with the General Surgical Risk consent document and Pain Medication Agreement prior to their appointment.  They had adequate time to read through the risk consent documents and Pain Medication Agreement. We also discussed them in person together during this preop appointment. All of their questions were answered to their satisfaction.  Recommended calling if they have any further questions.  Risk consent form and Pain Medication Agreement to be scanned into patient's chart.  The risk that can be encountered for this procedure were discussed and include the following but not limited to these: asymmetry, fluid accumulation, firmness of the tissue, skin loss, decrease or no sensation, fat necrosis, bleeding, infection, healing delay.  Deep vein thrombosis, cardiac and pulmonary complications are risks to any procedure.  There are risks of anesthesia, changes to skin sensation and injury to nerves or blood vessels.  The muscle can be temporarily or permanently injured.  You may have an allergic reaction to tape, suture, glue, blood products which can result in skin discoloration, swelling, pain, skin lesions, poor healing.  Any of these can lead to the need for revisonal surgery or stage procedures.  Weight gain and weigh loss can also effect the long term appearance. The results are not guaranteed to last a lifetime.  Future surgery may be required.      Electronically signed by: Honora Seip, PA-C 06/18/2024 2:28 PM

## 2024-06-16 NOTE — H&P (View-Only) (Signed)
 Patient ID: Erin Good, female    DOB: [birth year], 48 y.o.   MRN: [id number]  Chief Complaint  Patient presents with   Pre-op Exam      ICD-10-CM   1. Panniculitis  M79.3        History of Present Illness: Erin Good is a 48 y.o.  female  with a history of recurrent panniculitis.  She presents for preoperative evaluation for upcoming procedure, panniculectomy, scheduled for 07/08/2024 with Dr. Lowery.  The patient has not had problems with anesthesia.  Recent left rotator cuff surgery without complication from anesthesia.  She denies any personal or family history of blood clots or clotting disorder.  She denies any personal history of cardiac or pulmonary disease, cancer, varicosities, inflammatory bowel disease, or nicotine use.  She takes an oral progesterone  only pill.  She takes zonisamide  for epilepsy.  Clearance from primary care provider sent from Dr. Lowery at time of consult is still pending.  Patient reports that she has an appointment with PCP in 1 week.  Hold any vitamins and supplements 1 week prior to surgery.  She will also hold her Mounjaro  the week leading up to surgery.  Discussed expectations and risks of panniculectomy and she is agreeable to proceed.  Summary of Previous Visit: She met with Dr. Lowery for consult 06/01/2024.  At that time, she reportedly had lost approximately 120 pounds after initiating metformin  and Mounjaro .  She has had multiple C-sections in the past.  Complained of recurrent panniculitis.  No hernia palpated on exam.  Discussed panniculectomy and patient was agreeable to proceed.  States that we need PCP clearance due to her history of seizure disorder.  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  Denies need for any FMLA/STD.  Planning 2 weeks off from work completely.  PMH Significant for: Obesity with extreme weight loss, recurrent panniculitis, T2DM with well-controlled A1c most recently 5.2%, OSA,  HTN.   Past Medical History: Allergies: Allergies  Allergen Reactions   Peanuts [Peanut Oil] Anaphylaxis   Aspirin Other (See Comments)    Does not take because of her epilepsy/seizure    Aspirin Other (See Comments)    Causes seizures   Other Other (See Comments)    Peanuts    Current Medications:  Current Outpatient Medications:    Accu-Chek Softclix Lancets lancets, Use as instructed 1x a day, Disp: 100 each, Rfl: 3   Blood Glucose Monitoring Suppl (ACCU-CHEK GUIDE) w/Device KIT, Use as advised, Disp: 1 kit, Rfl: 0   cephALEXin  (KEFLEX ) 500 MG capsule, Take 1 capsule (500 mg total) by mouth 4 (four) times daily for 3 days., Disp: 12 capsule, Rfl: 0   Cholecalciferol (VITAMIN D ) 50 MCG (2000 UT) CAPS, Take 1 capsule by mouth daily at 12 noon., Disp: , Rfl:    clonazePAM  (KLONOPIN ) 0.5 MG tablet, Take 0.5 mg by mouth daily as needed., Disp: , Rfl:    Cyanocobalamin (B-12) 500 MCG SUBL, Place 1 tablet under the tongue daily at 12 noon., Disp: 100 tablet, Rfl: 0   EPINEPHrine  0.3 mg/0.3 mL IJ SOAJ injection, Inject 0.3 mg into the muscle as needed for anaphylaxis (for anaphylaxis). , Disp: , Rfl:    folic acid  (FOLVITE ) 1 MG tablet, Take 1 tablet (1 mg total) by mouth daily., Disp: 100 tablet, Rfl: 1   GAVILYTE-G 236 g solution, Take 4,000 mLs by mouth once., Disp: , Rfl:    glucose blood test strip, Use as instructed 1x a day,  Disp: 100 each, Rfl: 3   Insulin  Pen Needle 32G X 4 MM MISC, Use 1x a day, Disp: 100 each, Rfl: 3   ketoconazole  (NIZORAL ) 2 % cream, Apply 1 Application topically daily., Disp: 120 g, Rfl: 0   medroxyPROGESTERone  (PROVERA ) 10 MG tablet, Take 1 tablet (10 mg total) by mouth daily. Use for ten days, Disp: 10 tablet, Rfl: 2   metFORMIN  (GLUCOPHAGE -XR) 500 MG 24 hr tablet, Take 4 tablets (2,000 mg total) by mouth daily with supper., Disp: 360 tablet, Rfl: 3   MOUNJARO  15 MG/0.5ML Pen, INJECT THE CONTENTS OF ONE PEN  SUBCUTANEOUSLY WEEKLY AS  DIRECTED, Disp: 6 mL,  Rfl: 3   ondansetron  (ZOFRAN -ODT) 4 MG disintegrating tablet, Take 1 tablet (4 mg total) by mouth every 8 (eight) hours as needed for nausea or vomiting., Disp: 20 tablet, Rfl: 0   oxyCODONE  (ROXICODONE ) 5 MG immediate release tablet, Take 1 tablet (5 mg total) by mouth every 8 (eight) hours as needed for up to 5 days for severe pain (pain score 7-10)., Disp: 15 tablet, Rfl: 0   rosuvastatin  (CRESTOR ) 20 MG tablet, Take 1 tablet (20 mg total) by mouth daily., Disp: 90 tablet, Rfl: 3   zonisamide  (ZONEGRAN ) 100 MG capsule, Take 400 mg by mouth at bedtime., Disp: , Rfl:   Past Medical Problems: Past Medical History:  Diagnosis Date   Allergic rhinitis    Breast discharge 06/05/2017   2 weeks ago left   Breast mass 12/06/2016   left   COVID-19 virus infection 11/2020   Diabetes mellitus without complication (HCC)    Dyslipidemia    Epilepsy (HCC)    Febrile seizures (HCC)    Galactorrhea    Hx gestational diabetes    Hypertension    Kidney stones    Morbid obesity (HCC)    Morbid obesity with BMI of 40.0-44.9, adult (HCC)    Obesity    Seizures (HCC)    Sleep apnea    Syncope and collapse    Tachycardia     Past Surgical History: Past Surgical History:  Procedure Laterality Date   BREAST BIOPSY Left 2018   benign   CESAREAN SECTION     X 2   COLONOSCOPY WITH PROPOFOL  N/A 09/26/2022   Procedure: COLONOSCOPY WITH PROPOFOL ;  Surgeon: Unk Corinn Skiff, MD;  Location: [facility];  Service: Gastroenterology;  Laterality: N/A;   IRRIGATION AND DEBRIDEMENT SHOULDER Left 10/12/2020   Procedure: IRRIGATION AND DEBRIDEMENT SHOULDER;  Surgeon: Cristy Bonner DASEN, MD;  Location: [facility];  Service: Orthopedics;  Laterality: Left;   RIGHT OOPHORECTOMY Right 2001   benign tumor   TUBAL LIGATION  2007    Social History: Social History   Socioeconomic History   Marital status: Married    Spouse name: Dorn   Number of children: 2   Years of education: [employer]  education level: Master's degree (e.g., MA, MS, MEng, MEd, MSW, MBA)  Occupational History   Occupation: accountant  Tobacco Use   Smoking status: Never   Smokeless tobacco: Never  Vaping Use   Vaping status: Never Used  Substance and Sexual Activity   Alcohol use: Not Currently    Comment: occ   Drug use: Not Currently   Sexual activity: Yes    Partners: Male    Birth control/protection: Surgical    Comment: tubial lig  Other Topics Concern   Not on file  Social History Narrative   ** Merged History Encounter **  Social Drivers of Corporate investment banker Strain: Low Risk  (12/30/2023)   Overall Financial Resource Strain (CARDIA)    Difficulty of Paying Living Expenses: Not hard at all  Food Insecurity: No Food Insecurity (12/30/2023)   Hunger Vital Sign    Worried About Running Out of Food in the Last Year: Never true    Ran Out of Food in the Last Year: Never true  Transportation Needs: No Transportation Needs (12/30/2023)   PRAPARE - Administrator, Civil Service (Medical): No    Lack of Transportation (Non-Medical): No  Physical Activity: Sufficiently Active (12/30/2023)   Exercise Vital Sign    Days of Exercise per Week: 2 days    Minutes of Exercise per Session: 90 min  Stress: No Stress Concern Present (12/30/2023)   [employer] of Occupational Health - Occupational Stress Questionnaire    Feeling of Stress : Only a little  Social Connections: Unknown (12/30/2023)   Social Connection and Isolation Panel    Frequency of Communication with Friends and Family: Three times a week    Frequency of Social Gatherings with Friends and Family: Twice a week    Attends Religious Services: More than 4 times per year    Active Member of [employer] or Organizations: Yes    Attends Engineer, structural: More than 4 times per year    Marital Status: Patient declined  Intimate Partner Violence: Not At Risk (12/30/2023)   Humiliation, Afraid, Rape, and  Kick questionnaire    Fear of Current or Ex-Partner: No    Emotionally Abused: No    Physically Abused: No    Sexually Abused: No    Family History: Family History  Problem Relation Age of Onset   Diabetes Mother    Healthy Father    Cancer Maternal Grandmother    Breast cancer Paternal Grandmother 4   Cancer Paternal Grandmother    Cancer Paternal Aunt    Heart disease Neg Hx     Review of Systems: ROS Denies any recent chest pain, difficulty breathing, leg swelling, fevers.  Physical Exam: Vital Signs BP 137/76 (BP Location: Left Arm, Patient Position: Sitting, Cuff Size: Large)   Pulse 88   Ht 5' 7 (1.702 m)   Wt 245 lb 3.2 oz (111.2 kg)   SpO2 100%   BMI 38.40 kg/m   Physical Exam Constitutional:      General: Not in acute distress.    Appearance: Normal appearance. Not ill-appearing.  HENT:     Head: Normocephalic and atraumatic.  Eyes:     Pupils: Pupils are equal, round. Cardiovascular:     Rate and Rhythm: Normal rate.    Pulses: Normal pulses.  Pulmonary:     Effort: No respiratory distress or increased work of breathing.  Speaks in full sentences. Abdominal:     General: Abdomen is flat. No distension.   Musculoskeletal: Normal range of motion. No lower extremity swelling or edema. No varicosities. Skin:    General: Skin is warm and dry.     Findings: No erythema or rash.  Neurological:     Mental Status: Alert and oriented to person, place, and time.  Psychiatric:        Mood and Affect: Mood normal.        Behavior: Behavior normal.    Assessment/Plan: The patient is scheduled for panniculectomy with Dr. Lowery.  Risks, benefits, and alternatives of procedure discussed, questions answered and consent obtained.    Smoking Status:  Non-smoker.  Caprini Score: 4; Risk Factors include: Age, BMI greater than 25, and length of planned surgery. Recommendation for mechanical prophylaxis. Encourage early ambulation.   Pictures obtained:  06/01/2024  Post-op Rx sent to pharmacy: Oxycodone , Zofran , Keflex .  Patient was provided with the General Surgical Risk consent document and Pain Medication Agreement prior to their appointment.  They had adequate time to read through the risk consent documents and Pain Medication Agreement. We also discussed them in person together during this preop appointment. All of their questions were answered to their satisfaction.  Recommended calling if they have any further questions.  Risk consent form and Pain Medication Agreement to be scanned into patient's chart.  The risk that can be encountered for this procedure were discussed and include the following but not limited to these: asymmetry, fluid accumulation, firmness of the tissue, skin loss, decrease or no sensation, fat necrosis, bleeding, infection, healing delay.  Deep vein thrombosis, cardiac and pulmonary complications are risks to any procedure.  There are risks of anesthesia, changes to skin sensation and injury to nerves or blood vessels.  The muscle can be temporarily or permanently injured.  You may have an allergic reaction to tape, suture, glue, blood products which can result in skin discoloration, swelling, pain, skin lesions, poor healing.  Any of these can lead to the need for revisonal surgery or stage procedures.  Weight gain and weigh loss can also effect the long term appearance. The results are not guaranteed to last a lifetime.  Future surgery may be required.      Electronically signed by: Honora Seip, PA-C 06/18/2024 2:28 PM

## 2024-06-18 ENCOUNTER — Ambulatory Visit (INDEPENDENT_AMBULATORY_CARE_PROVIDER_SITE_OTHER): Admitting: Physician Assistant

## 2024-06-18 ENCOUNTER — Encounter: Payer: Self-pay | Admitting: Physician Assistant

## 2024-06-18 ENCOUNTER — Telehealth: Payer: Self-pay

## 2024-06-18 VITALS — BP 137/76 | HR 88 | Ht 67.0 in | Wt 245.2 lb

## 2024-06-18 DIAGNOSIS — M793 Panniculitis, unspecified: Secondary | ICD-10-CM

## 2024-06-18 MED ORDER — CEPHALEXIN 500 MG PO CAPS: 500.0000 mg | ORAL_CAPSULE | Freq: Four times a day (QID) | ORAL | 0 refills | Status: AC

## 2024-06-18 MED ORDER — OXYCODONE HCL 5 MG PO TABS: 5.0000 mg | ORAL_TABLET | Freq: Three times a day (TID) | ORAL | 0 refills | Status: AC | PRN

## 2024-06-18 MED ORDER — ONDANSETRON 4 MG PO TBDP: 4.0000 mg | ORAL_TABLET | Freq: Three times a day (TID) | ORAL | 0 refills | Status: DC | PRN

## 2024-06-18 NOTE — Telephone Encounter (Signed)
 Spoke to [place] at patient's PCP office inquiring of surgical clearance that was faxed 7/29. She adv patient had an 6 mo f/u appt scheduled for 8/28 but for the surgical clearance she would need a separate appt for that. She scheduled patient for 8/18 @ 1:20pm. The surgical clearance form will be completed then. I informed patient during pre op appt with Honora, [place] and she agreed to appt and conveyed understanding.

## 2024-06-22 ENCOUNTER — Ambulatory Visit (INDEPENDENT_AMBULATORY_CARE_PROVIDER_SITE_OTHER): Admitting: Family Medicine

## 2024-06-22 VITALS — BP 124/86 | HR 98 | Temp 98.0°F | Resp 16 | Ht 67.0 in | Wt 246.1 lb

## 2024-06-22 DIAGNOSIS — E65 Localized adiposity: Secondary | ICD-10-CM

## 2024-06-22 DIAGNOSIS — E785 Hyperlipidemia, unspecified: Secondary | ICD-10-CM

## 2024-06-22 DIAGNOSIS — G40409 Other generalized epilepsy and epileptic syndromes, not intractable, without status epilepticus: Secondary | ICD-10-CM

## 2024-06-22 DIAGNOSIS — Z7984 Long term (current) use of oral hypoglycemic drugs: Secondary | ICD-10-CM

## 2024-06-22 DIAGNOSIS — E1169 Type 2 diabetes mellitus with other specified complication: Secondary | ICD-10-CM

## 2024-06-22 DIAGNOSIS — Z01818 Encounter for other preprocedural examination: Secondary | ICD-10-CM

## 2024-06-22 NOTE — Progress Notes (Signed)
 Name: Erin Good   MRN: [id number]    DOB: 1976-06-03   Date:06/22/2024       Progress Note  Subjective  Chief Complaint  Chief Complaint  Patient presents with   Pre-op Exam    Plastic surgery for abdomen   Discussed the use of AI scribe software for clinical note transcription with the patient, who gave verbal consent to proceed.  History of Present Illness Erin Good is a 48 year old female who presents for preoperative clearance for a panniculectomy.  She is scheduled for a panniculectomy on September 3rd following significant weight loss. Her heaviest weight was approximately 370 pounds, and she has since lost over 100 pounds, currently weighing 246 pounds. The weight loss was achieved through medication, meal prep, mindful eating, and increased physical activity, including gym workouts. The excess skin is causing a rash, particularly during physical activity.  She has type 2 diabetes, with her most recent A1c at 5.2. She is currently taking metformin  2000 mg and Mounjaro  15 mg weekly . Her blood sugar levels at home are reported to be below 100.   She has a history of seizures, managed with zonisamide  once twice daily . She has not had any seizure episodes in over six months. She also has a prescription for clonazepam  to use as needed for seizures, but she does not take it daily.  She previously had sleep apnea, which resolved following her weight loss. A sleep study conducted approximately a year and a half ago confirmed the resolution of sleep apnea.  She has hyperlipidemia and is taking rosuvastatin . Her cholesterol levels in February were elevated, and she believes her rosuvastatin  dose was increased at that time.  No current issues with shortness of breath, cough, chest pain, or wheezing. She can go up a flight of stairs without difficulty. No history of heart disease or stroke. No previous complications to anesthesia     Patient Active Problem List    Diagnosis Date Noted   Back pain 06/01/2024   Panniculitis 06/01/2024   History of abnormal cervical Pap smear 01/29/2024   Menorrhagia with irregular cycle 01/29/2024   Diabetes mellitus without complication (HCC) 01/29/2024   Abnormal screening mammogram 01/29/2024   Obesity, Class III, BMI 40-49.9 (morbid obesity) 11/06/2023   Encounter for well woman exam with routine gynecological exam 09/26/2022   Morbid obesity (HCC)    Impingement syndrome of left shoulder region 04/20/2020   Vitamin D  deficiency 01/31/2017   Bell's palsy 12/19/2016   Allergic rhinitis 04/20/2015   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   Gastro-esophageal reflux disease without esophagitis 04/20/2015   NASH (nonalcoholic steatohepatitis) 04/20/2015   Allergy to nuts 04/20/2015   Calculus of kidney 04/20/2015   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   Dyslipidemia 07/02/2008   Leukocytosis 07/29/2007    Past Surgical History:  Procedure Laterality Date   BREAST BIOPSY Left 2018   benign   CESAREAN SECTION     X 2   COLONOSCOPY WITH PROPOFOL  N/A 09/26/2022   Procedure: COLONOSCOPY WITH PROPOFOL ;  Surgeon: Unk Corinn Skiff, MD;  Location: [facility];  Service: Gastroenterology;  Laterality: N/A;   IRRIGATION AND DEBRIDEMENT SHOULDER Left 10/12/2020   Procedure: IRRIGATION AND DEBRIDEMENT SHOULDER;  Surgeon: Cristy Bonner DASEN, MD;  Location: [facility];  Service: Orthopedics;  Laterality: Left;   RIGHT OOPHORECTOMY Right 2001   benign tumor   TUBAL LIGATION  2007    Family History  Problem Relation Age  of Onset   Diabetes Mother    Healthy Father    Cancer Maternal Grandmother    Breast cancer Paternal Grandmother 29   Cancer Paternal Grandmother    Cancer Paternal Aunt    Heart disease Neg Hx     Social History   Tobacco Use   Smoking status: Never   Smokeless tobacco: Never  Substance Use Topics   Alcohol use: Not Currently    Comment: occ      Current Outpatient Medications:    Accu-Chek Softclix Lancets lancets, Use as instructed 1x a day, Disp: 100 each, Rfl: 3   Blood Glucose Monitoring Suppl (ACCU-CHEK GUIDE) w/Device KIT, Use as advised, Disp: 1 kit, Rfl: 0   Cholecalciferol (VITAMIN D ) 50 MCG (2000 UT) CAPS, Take 1 capsule by mouth daily at 12 noon., Disp: , Rfl:    clonazePAM  (KLONOPIN ) 0.5 MG tablet, Take 0.5 mg by mouth daily as needed., Disp: , Rfl:    Cyanocobalamin (B-12) 500 MCG SUBL, Place 1 tablet under the tongue daily at 12 noon., Disp: 100 tablet, Rfl: 0   EPINEPHrine  0.3 mg/0.3 mL IJ SOAJ injection, Inject 0.3 mg into the muscle as needed for anaphylaxis (for anaphylaxis). , Disp: , Rfl:    folic acid  (FOLVITE ) 1 MG tablet, Take 1 tablet (1 mg total) by mouth daily., Disp: 100 tablet, Rfl: 1   GAVILYTE-G 236 g solution, Take 4,000 mLs by mouth once., Disp: , Rfl:    glucose blood test strip, Use as instructed 1x a day, Disp: 100 each, Rfl: 3   Insulin  Pen Needle 32G X 4 MM MISC, Use 1x a day, Disp: 100 each, Rfl: 3   ketoconazole  (NIZORAL ) 2 % cream, Apply 1 Application topically daily., Disp: 120 g, Rfl: 0   metFORMIN  (GLUCOPHAGE -XR) 500 MG 24 hr tablet, Take 4 tablets (2,000 mg total) by mouth daily with supper., Disp: 360 tablet, Rfl: 3   MOUNJARO  15 MG/0.5ML Pen, INJECT THE CONTENTS OF ONE PEN  SUBCUTANEOUSLY WEEKLY AS  DIRECTED, Disp: 6 mL, Rfl: 3   ondansetron  (ZOFRAN -ODT) 4 MG disintegrating tablet, Take 1 tablet (4 mg total) by mouth every 8 (eight) hours as needed for nausea or vomiting., Disp: 20 tablet, Rfl: 0   oxyCODONE  (ROXICODONE ) 5 MG immediate release tablet, Take 1 tablet (5 mg total) by mouth every 8 (eight) hours as needed for up to 5 days for severe pain (pain score 7-10)., Disp: 15 tablet, Rfl: 0   rosuvastatin  (CRESTOR ) 20 MG tablet, Take 1 tablet (20 mg total) by mouth daily., Disp: 90 tablet, Rfl: 3   zonisamide  (ZONEGRAN ) 100 MG capsule, Take 400 mg by mouth at bedtime., Disp: , Rfl:    Allergies  Allergen Reactions   Peanuts [Peanut Oil] Anaphylaxis   Aspirin Other (See Comments)    Does not take because of her epilepsy/seizure    Aspirin Other (See Comments)    Causes seizures   Other Other (See Comments)    Peanuts    I personally reviewed active problem list, medication list, allergies with the patient/caregiver today.   ROS  Ten systems reviewed and is negative except as mentioned in HPI    Objective Physical Exam  CONSTITUTIONAL: Patient appears well-developed and well-nourished.  No distress. HEENT: Head atraumatic, normocephalic, neck supple. CARDIOVASCULAR: Normal rate, regular rhythm and normal heart sounds.  No murmur heard. No BLE edema. PULMONARY: Effort normal and breath sounds normal. No respiratory distress. MUSCULOSKELETAL: Normal gait. Without gross motor or sensory deficit. PSYCHIATRIC: Patient  has a normal mood and affect. behavior is normal. Judgment and thought content normal.  Vitals:   06/22/24 1304  BP: 124/86  Pulse: 98  Resp: 16  Temp: 98 F (36.7 C)  TempSrc: Oral  SpO2: 99%  Weight: 246 lb 1.6 oz (111.6 kg)  Height: 5' 7 (1.702 m)    Body mass index is 38.54 kg/m.  Recent Results (from the past 2160 hours)  I-STAT creatinine     Status: None   Collection Time: 04/22/24  3:43 PM  Result Value Ref Range   Creatinine, Ser 0.90 0.44 - 1.00 mg/dL  Microalbumin / creatinine urine ratio     Status: None   Collection Time: 05/13/24  9:20 AM  Result Value Ref Range   Creatinine, Urine 104 20 - 275 mg/dL   Microalb, Ur 1.2 mg/dL    Comment: Reference Range Not established    Microalb Creat Ratio 12 <30 mg/g creat    Comment: . The ADA defines abnormalities in albumin excretion as follows: [name] Albuminuria Category        Result (mg/g creatinine) . Normal to Mildly increased   <30 Moderately increased         30-299  Severely increased           > OR = 300 . The ADA recommends that at least two of  three specimens collected within a 3-6 month period be abnormal before considering a patient to be within a diagnostic category.   POCT glycosylated hemoglobin (Hb A1C)     Status: Abnormal   Collection Time: 05/13/24  9:41 AM  Result Value Ref Range   Hemoglobin A1C 5.2 (B) 4.0 - 5.6 %    Comment: hit the incorrect number on the keys.   HbA1c POC (<> result, manual entry)     HbA1c, POC (prediabetic range)     HbA1c, POC (controlled diabetic range)       PHQ2/9:    06/22/2024    1:04 PM 12/30/2023    2:12 PM 01/25/2023    1:39 PM 06/26/2022    1:45 PM 12/22/2021    1:00 PM  Depression screen PHQ 2/9  Decreased Interest 0 0 0 0 0  Down, Depressed, Hopeless 0 0 0 0 0  PHQ - 2 Score 0 0 0 0 0  Altered sleeping  0 0 0 0  Tired, decreased energy  0 0 3 0  Change in appetite  0 0 0 0  Feeling bad or failure about yourself   0 0 0 0  Trouble concentrating  0 0 0 0  Moving slowly or fidgety/restless  0 0 0 0  Suicidal thoughts  0 0 0 0  PHQ-9 Score  0 0 3 0  Difficult doing work/chores  Not difficult at all Not difficult at all      phq 9 is negative  Fall Risk:    06/22/2024    1:04 PM 12/30/2023    2:11 PM 05/22/2023    8:45 AM 01/25/2023    1:39 PM 06/26/2022    1:45 PM  Fall Risk   Falls in the past year? 0 0 0 0 1  Number falls in past yr: 0 0 0 0 0  Injury with Fall? 0 0 0 0 1  Risk for fall due to : No Fall Risks No Fall Risks No Fall Risks  No Fall Risks  Follow up Falls evaluation completed Falls prevention discussed;Education provided;Falls evaluation completed Falls evaluation completed  Falls prevention discussed      Data saved with a previous flowsheet row definition     Assessment & Plan Preoperative Medical Evaluation for Panniculectomy Preoperative evaluation satisfactory. Functional capacity adequate. Labs, EKG, and A1c controlled. No cardiac or cerebrovascular history. Aspiration risk due to Mounjaro . - Proceed with surgery without further testing. -  Ensure anesthesiologist is informed of seizure history. - Hold Mounjaro  7 days before surgery. - last dose of Metformin  24 hours prior to surgery   Type 2 Diabetes Mellitus, controlled Diabetes well-controlled with A1c of 5.2. Blood glucose consistently below 100. Managed with metformin  and Mounjaro . - Hold metformin  24 hours before surgery. - Hold Mounjaro  7 days before surgery.  Seizure Disorder, stable on medication Seizure disorder stable, no episodes in over six months. Managed with zonisamide  and as-needed clonazepam . - Continue zonisamide  , including morning of surgery  - Inform neurologist and anesthesiologist of seizure history.  Hyperlipidemia Managed with rosuvastatin . Cholesterol levels not optimal in February, no recent updates.  Obesity status post significant weight loss Obesity status post over 100 pounds weight loss, currently 246 pounds. Weight loss due to Mounjaro , meal prep, mindful eating, and increased physical activity.  Panniculus with recurrent rash Panniculus causing recurrent rash due to friction. Panniculectomy planned.

## 2024-06-23 NOTE — Telephone Encounter (Signed)
 Received surgical clearance from requested provider, Dorette Loron, MD. Please review document under Media tab.   Thanks

## 2024-06-30 ENCOUNTER — Encounter (HOSPITAL_BASED_OUTPATIENT_CLINIC_OR_DEPARTMENT_OTHER): Payer: Self-pay | Admitting: Plastic Surgery

## 2024-06-30 ENCOUNTER — Other Ambulatory Visit: Payer: Self-pay

## 2024-06-30 NOTE — Progress Notes (Signed)
   06/30/24 1051  PAT Phone Screen  Is the patient taking a GLP-1 receptor agonist? Yes (8/15 last dose)  Has the patient been informed on holding medication? Yes  Do You Have Diabetes? Yes  Do You Have Hypertension? Yes (no meds)  Have You Ever Been to the ER for Asthma? No  Have You Taken Oral Steroids in the Past 3 Months? No  Do you Take Phenteramine or any Other Diet Drugs? No  Recent  Lab Work, EKG, CXR? Yes  Where was this test performed? 11/09/23 EKG ST  Do you have a history of heart problems? No  Any Recent Hospitalizations? No  Height 5' 7 (1.702 m)  Weight 111 kg  Pat Appointment Scheduled Yes ([facility])   Medical clearance on chart. Pt followed by [facility] for epilepsy. Last seizure 11/06/23, compliant with medication.

## 2024-07-02 ENCOUNTER — Ambulatory Visit: Payer: 59 | Admitting: Family Medicine

## 2024-07-03 ENCOUNTER — Encounter (HOSPITAL_BASED_OUTPATIENT_CLINIC_OR_DEPARTMENT_OTHER)
Admission: RE | Admit: 2024-07-03 | Discharge: 2024-07-03 | Disposition: A | Discharge status: Home / Self Care | Source: Ambulatory Visit | Attending: Plastic Surgery | Admitting: Plastic Surgery

## 2024-07-03 DIAGNOSIS — Z01818 Encounter for other preprocedural examination: Secondary | ICD-10-CM | POA: Insufficient documentation

## 2024-07-03 LAB — BASIC METABOLIC PANEL WITH GFR
Anion gap: 9 (ref 5–15)
BUN: 8 mg/dL (ref 6–20)
CO2: 23 mmol/L (ref 22–32)
Calcium: 9.3 mg/dL (ref 8.9–10.3)
Chloride: 110 mmol/L (ref 98–111)
Creatinine, Ser: 0.85 mg/dL (ref 0.44–1.00)
GFR, Estimated: >60 mL/min (ref >60)
Glucose, Bld: 104 mg/dL — ABNORMAL HIGH (ref 70–99)
Potassium: 4.3 mmol/L (ref 3.5–5.1)
Sodium: 142 mmol/L (ref 135–145)

## 2024-07-08 ENCOUNTER — Encounter (HOSPITAL_BASED_OUTPATIENT_CLINIC_OR_DEPARTMENT_OTHER): Payer: Self-pay | Admitting: Plastic Surgery

## 2024-07-08 ENCOUNTER — Ambulatory Visit (HOSPITAL_BASED_OUTPATIENT_CLINIC_OR_DEPARTMENT_OTHER)
Admission: RE | Admit: 2024-07-08 | Discharge: 2024-07-08 | Disposition: A | Discharge status: Home / Self Care | Attending: Plastic Surgery | Admitting: Plastic Surgery

## 2024-07-08 ENCOUNTER — Other Ambulatory Visit: Payer: Self-pay

## 2024-07-08 ENCOUNTER — Ambulatory Visit (HOSPITAL_BASED_OUTPATIENT_CLINIC_OR_DEPARTMENT_OTHER): Admitting: Anesthesiology

## 2024-07-08 ENCOUNTER — Encounter (HOSPITAL_BASED_OUTPATIENT_CLINIC_OR_DEPARTMENT_OTHER)
Admission: RE | Disposition: A | Discharge status: Home / Self Care | Payer: Self-pay | Source: Home / Self Care | Attending: Plastic Surgery

## 2024-07-08 DIAGNOSIS — Z79899 Other long term (current) drug therapy: Secondary | ICD-10-CM | POA: Diagnosis not present

## 2024-07-08 DIAGNOSIS — Z793 Long term (current) use of hormonal contraceptives: Secondary | ICD-10-CM | POA: Insufficient documentation

## 2024-07-08 DIAGNOSIS — G40909 Epilepsy, unspecified, not intractable, without status epilepticus: Secondary | ICD-10-CM | POA: Insufficient documentation

## 2024-07-08 DIAGNOSIS — Z7984 Long term (current) use of oral hypoglycemic drugs: Secondary | ICD-10-CM

## 2024-07-08 DIAGNOSIS — Z6838 Body mass index (BMI) 38.0-38.9, adult: Secondary | ICD-10-CM | POA: Insufficient documentation

## 2024-07-08 DIAGNOSIS — M793 Panniculitis, unspecified: Secondary | ICD-10-CM

## 2024-07-08 DIAGNOSIS — I10 Essential (primary) hypertension: Secondary | ICD-10-CM | POA: Insufficient documentation

## 2024-07-08 DIAGNOSIS — E119 Type 2 diabetes mellitus without complications: Secondary | ICD-10-CM | POA: Diagnosis not present

## 2024-07-08 DIAGNOSIS — Z7985 Long-term (current) use of injectable non-insulin antidiabetic drugs: Secondary | ICD-10-CM | POA: Insufficient documentation

## 2024-07-08 DIAGNOSIS — E66813 Obesity, class 3: Secondary | ICD-10-CM | POA: Insufficient documentation

## 2024-07-08 DIAGNOSIS — Z01818 Encounter for other preprocedural examination: Secondary | ICD-10-CM

## 2024-07-08 HISTORY — PX: ABDOMINOPLASTY/PANNICULECTOMY WITH LIPOSUCTION: SHX5577

## 2024-07-08 LAB — GLUCOSE, CAPILLARY
Glucose-Capillary: 114 mg/dL — ABNORMAL HIGH (ref 70–99)
Glucose-Capillary: 141 mg/dL — ABNORMAL HIGH (ref 70–99)

## 2024-07-08 LAB — POCT PREGNANCY, URINE: Preg Test, Ur: NEGATIVE

## 2024-07-08 SURGERY — PANNICULECTOMY, ABDOMINAL, WITH LIPOSUCTION
Anesthesia: General | Site: Abdomen

## 2024-07-08 MED ORDER — LIDOCAINE HCL 1 % IJ SOLN
INTRAVENOUS | Status: DC | PRN
  Administered 2024-07-08: 300 mL

## 2024-07-08 MED ORDER — SODIUM CHLORIDE 0.9% FLUSH
3.0000 mL | INTRAVENOUS | Status: DC | PRN
Start: 2024-07-08 — End: 2024-07-08

## 2024-07-08 MED ORDER — PROPOFOL 10 MG/ML IV BOLUS
INTRAVENOUS | Status: DC | PRN
  Administered 2024-07-08: 50 mg via INTRAVENOUS
  Administered 2024-07-08: 150 mg via INTRAVENOUS

## 2024-07-08 MED ORDER — ONDANSETRON HCL 4 MG/2ML IJ SOLN
INTRAMUSCULAR | Status: DC | PRN
  Administered 2024-07-08: 4 mg via INTRAVENOUS

## 2024-07-08 MED ORDER — CEFAZOLIN SODIUM-DEXTROSE 2-4 GM/100ML-% IV SOLN
INTRAVENOUS | Status: AC
  Filled 2024-07-08: qty 100

## 2024-07-08 MED ORDER — LIDOCAINE 2% (20 MG/ML) 5 ML SYRINGE
INTRAMUSCULAR | Status: AC
  Filled 2024-07-08: qty 10

## 2024-07-08 MED ORDER — PROPOFOL 500 MG/50ML IV EMUL
INTRAVENOUS | Status: AC
  Filled 2024-07-08: qty 50

## 2024-07-08 MED ORDER — HYDROMORPHONE HCL 1 MG/ML IJ SOLN
0.2500 mg | INTRAMUSCULAR | Status: DC | PRN
  Administered 2024-07-08 (×4): 0.25 mg via INTRAVENOUS

## 2024-07-08 MED ORDER — CHLORHEXIDINE GLUCONATE CLOTH 2 % EX PADS: 6.0000 | MEDICATED_PAD | Freq: Once | CUTANEOUS | Status: DC

## 2024-07-08 MED ORDER — ACETAMINOPHEN 500 MG PO TABS
ORAL_TABLET | ORAL | Status: AC
  Filled 2024-07-08: qty 2

## 2024-07-08 MED ORDER — ACETAMINOPHEN 325 MG RE SUPP
650.0000 mg | RECTAL | Status: DC | PRN
Start: 2024-07-08 — End: 2024-07-08

## 2024-07-08 MED ORDER — PROPOFOL 500 MG/50ML IV EMUL
INTRAVENOUS | Status: DC | PRN
  Administered 2024-07-08: 30 ug/kg/min via INTRAVENOUS

## 2024-07-08 MED ORDER — FENTANYL CITRATE (PF) 100 MCG/2ML IJ SOLN
INTRAMUSCULAR | Status: DC | PRN
  Administered 2024-07-08 (×2): 50 ug via INTRAVENOUS

## 2024-07-08 MED ORDER — SODIUM CHLORIDE 0.9 % IV SOLN
250.0000 mL | INTRAVENOUS | Status: DC | PRN
Start: 2024-07-08 — End: 2024-07-08

## 2024-07-08 MED ORDER — OXYCODONE HCL 5 MG PO TABS: 5.0000 mg | ORAL_TABLET | ORAL | Status: DC | PRN

## 2024-07-08 MED ORDER — HYDROMORPHONE HCL 1 MG/ML IJ SOLN
INTRAMUSCULAR | Status: DC | PRN
  Administered 2024-07-08: .5 mg via INTRAVENOUS

## 2024-07-08 MED ORDER — DEXMEDETOMIDINE HCL IN NACL 80 MCG/20ML IV SOLN
INTRAVENOUS | Status: DC | PRN
  Administered 2024-07-08: 8 ug via INTRAVENOUS

## 2024-07-08 MED ORDER — HYDROMORPHONE HCL 1 MG/ML IJ SOLN
INTRAMUSCULAR | Status: AC
  Filled 2024-07-08: qty 0.5

## 2024-07-08 MED ORDER — ACETAMINOPHEN 500 MG PO TABS
1000.0000 mg | ORAL_TABLET | Freq: Once | ORAL | Status: AC
  Administered 2024-07-08: 1000 mg via ORAL

## 2024-07-08 MED ORDER — DEXMEDETOMIDINE HCL IN NACL 80 MCG/20ML IV SOLN
INTRAVENOUS | Status: AC
  Filled 2024-07-08: qty 20

## 2024-07-08 MED ORDER — CEFAZOLIN SODIUM-DEXTROSE 2-4 GM/100ML-% IV SOLN
2.0000 g | INTRAVENOUS | Status: AC
  Administered 2024-07-08: 2 g via INTRAVENOUS

## 2024-07-08 MED ORDER — HYDROMORPHONE HCL 1 MG/ML IJ SOLN
INTRAMUSCULAR | Status: AC
Start: 2024-07-08 — End: 2024-07-08
  Filled 2024-07-08: qty 0.5

## 2024-07-08 MED ORDER — ACETAMINOPHEN 325 MG PO TABS: 650.0000 mg | ORAL_TABLET | ORAL | Status: DC | PRN

## 2024-07-08 MED ORDER — TRANEXAMIC ACID-NACL 1000-0.7 MG/100ML-% IV SOLN
INTRAVENOUS | Status: AC
  Filled 2024-07-08: qty 100

## 2024-07-08 MED ORDER — LIDOCAINE-EPINEPHRINE 1 %-1:100000 IJ SOLN
INTRAMUSCULAR | Status: DC | PRN
  Administered 2024-07-08: 50 mL

## 2024-07-08 MED ORDER — MIDAZOLAM HCL 2 MG/2ML IJ SOLN
INTRAMUSCULAR | Status: AC
  Filled 2024-07-08: qty 2

## 2024-07-08 MED ORDER — SODIUM CHLORIDE 0.9% FLUSH: 3.0000 mL | Freq: Two times a day (BID) | INTRAVENOUS | Status: DC

## 2024-07-08 MED ORDER — SUGAMMADEX SODIUM 200 MG/2ML IV SOLN
INTRAVENOUS | Status: DC | PRN
  Administered 2024-07-08: 200 mg via INTRAVENOUS

## 2024-07-08 MED ORDER — LACTATED RINGERS IV SOLN: INTRAVENOUS | Status: DC

## 2024-07-08 MED ORDER — MIDAZOLAM HCL 2 MG/2ML IJ SOLN
INTRAMUSCULAR | Status: DC | PRN
  Administered 2024-07-08: 2 mg via INTRAVENOUS

## 2024-07-08 MED ORDER — BUPIVACAINE LIPOSOME 1.3 % IJ SUSP
INTRAMUSCULAR | Status: DC | PRN
  Administered 2024-07-08: 40 mL

## 2024-07-08 MED ORDER — TRANEXAMIC ACID-NACL 1000-0.7 MG/100ML-% IV SOLN
INTRAVENOUS | Status: DC | PRN
  Administered 2024-07-08: 1000 mg via INTRAVENOUS

## 2024-07-08 MED ORDER — FENTANYL CITRATE (PF) 100 MCG/2ML IJ SOLN
INTRAMUSCULAR | Status: AC
Start: 2024-07-08 — End: 2024-07-08
  Filled 2024-07-08: qty 2

## 2024-07-08 MED ORDER — ROCURONIUM BROMIDE 10 MG/ML (PF) SYRINGE
PREFILLED_SYRINGE | INTRAVENOUS | Status: AC
  Filled 2024-07-08: qty 20

## 2024-07-08 MED ORDER — 0.9 % SODIUM CHLORIDE (POUR BTL) OPTIME
TOPICAL | Status: DC | PRN
Start: 2024-07-08 — End: 2024-07-08
  Administered 2024-07-08: 1000 mL

## 2024-07-08 MED ORDER — FENTANYL CITRATE (PF) 100 MCG/2ML IJ SOLN: 25.0000 ug | INTRAMUSCULAR | Status: DC | PRN

## 2024-07-08 MED ORDER — DEXAMETHASONE SODIUM PHOSPHATE 4 MG/ML IJ SOLN
INTRAMUSCULAR | Status: DC | PRN
  Administered 2024-07-08: 5 mg via INTRAVENOUS

## 2024-07-08 MED ORDER — LIDOCAINE HCL (CARDIAC) PF 100 MG/5ML IV SOSY
PREFILLED_SYRINGE | INTRAVENOUS | Status: DC | PRN
Start: 2024-07-08 — End: 2024-07-08
  Administered 2024-07-08: 60 mg via INTRAVENOUS

## 2024-07-08 MED ORDER — ROCURONIUM BROMIDE 100 MG/10ML IV SOLN
INTRAVENOUS | Status: DC | PRN
  Administered 2024-07-08 (×2): 10 mg via INTRAVENOUS
  Administered 2024-07-08: 50 mg via INTRAVENOUS

## 2024-07-08 SURGICAL SUPPLY — 59 items
BAG DECANTER FOR FLEXI CONT (MISCELLANEOUS) IMPLANT
BINDER ABDOMINAL 10 UNV 27-48 (MISCELLANEOUS) IMPLANT
BINDER ABDOMINAL 12 ML 46-62 (SOFTGOODS) IMPLANT
BINDER ABDOMINAL 12 SM 30-45 (SOFTGOODS) IMPLANT
BIOPATCH RED 1 DISK 7.0 (GAUZE/BANDAGES/DRESSINGS) IMPLANT
BLADE CLIPPER SURG (BLADE) ×1 IMPLANT
BLADE HEX COATED 2.75 (ELECTRODE) ×1 IMPLANT
BLADE SURG 10 STRL SS (BLADE) ×2 IMPLANT
BLADE SURG 15 STRL LF DISP TIS (BLADE) ×1 IMPLANT
CANISTER SUCT 1200ML W/VALVE (MISCELLANEOUS) ×1 IMPLANT
CLIP APPLIE 9.375 MED OPEN (MISCELLANEOUS) IMPLANT
COVER BACK TABLE 60X90IN (DRAPES) ×1 IMPLANT
COVER MAYO STAND STRL (DRAPES) ×1 IMPLANT
DERMABOND ADVANCED .7 DNX12 (GAUZE/BANDAGES/DRESSINGS) ×2 IMPLANT
DRAIN CHANNEL 19F RND (DRAIN) IMPLANT
DRAPE INCISE IOBAN 66X45 STRL (DRAPES) IMPLANT
DRAPE LAPAROSCOPIC ABDOMINAL (DRAPES) ×1 IMPLANT
DRESSING MORCELLS FINE 1000 (Tissue) IMPLANT
DRSG MEPILEX POST OP 4X8 (GAUZE/BANDAGES/DRESSINGS) ×2 IMPLANT
DRSG TEGADERM 2-3/8X2-3/4 SM (GAUZE/BANDAGES/DRESSINGS) ×2 IMPLANT
DRSG TEGADERM 4X4.75 (GAUZE/BANDAGES/DRESSINGS) IMPLANT
ELECTRODE BLDE 4.0 EZ CLN MEGD (MISCELLANEOUS) ×1 IMPLANT
ELECTRODE REM PT RTRN 9FT ADLT (ELECTROSURGICAL) ×1 IMPLANT
EVACUATOR SILICONE 100CC (DRAIN) IMPLANT
GAUZE PAD ABD 8X10 STRL (GAUZE/BANDAGES/DRESSINGS) ×2 IMPLANT
GAUZE SPONGE 4X4 12PLY STRL (GAUZE/BANDAGES/DRESSINGS) IMPLANT
GLOVE BIO SURGEON STRL SZ 6.5 (GLOVE) ×2 IMPLANT
GLOVE BIOGEL PI IND STRL 7.0 (GLOVE) ×1 IMPLANT
GOWN STRL REUS W/ TWL LRG LVL3 (GOWN DISPOSABLE) ×2 IMPLANT
LINER CANISTER 1000CC FLEX (MISCELLANEOUS) IMPLANT
MARKER SKIN DUAL TIP RULER LAB (MISCELLANEOUS) IMPLANT
NDL HYPO 25X1 1.5 SAFETY (NEEDLE) ×1 IMPLANT
NDL SAFETY ECLIPSE 18X1.5 (NEEDLE) IMPLANT
NEEDLE HYPO 25X1 1.5 SAFETY (NEEDLE) ×2 IMPLANT
NS IRRIG 1000ML POUR BTL (IV SOLUTION) ×1 IMPLANT
PACK BASIN DAY SURGERY FS (CUSTOM PROCEDURE TRAY) ×1 IMPLANT
PAD FOAM SILICONE BACKED (GAUZE/BANDAGES/DRESSINGS) ×1 IMPLANT
PENCIL SMOKE EVACUATOR (MISCELLANEOUS) ×1 IMPLANT
PIN SAFETY STERILE (MISCELLANEOUS) IMPLANT
SLEEVE SCD COMPRESS KNEE MED (STOCKING) ×1 IMPLANT
SPONGE T-LAP 18X18 ~~LOC~~+RFID (SPONGE) ×2 IMPLANT
STRIP SUTURE WOUND CLOSURE 1/2 (MISCELLANEOUS) ×2 IMPLANT
SUT MNCRL AB 4-0 PS2 18 (SUTURE) ×2 IMPLANT
SUT MON AB 3-0 SH27 (SUTURE) ×2 IMPLANT
SUT MON AB 5-0 PS2 18 (SUTURE) IMPLANT
SUT PDS II 3-0 CT2 27 ABS (SUTURE) ×5 IMPLANT
SUT SILK 2 0 SH (SUTURE) IMPLANT
SUT VIC AB 4-0 PS2 18 (SUTURE) ×2 IMPLANT
SYR 50ML LL SCALE MARK (SYRINGE) IMPLANT
SYR BULB IRRIG 60ML STRL (SYRINGE) ×1 IMPLANT
SYR CONTROL 10ML LL (SYRINGE) ×1 IMPLANT
SYR TB 1ML LL NO SAFETY (SYRINGE) IMPLANT
TOWEL GREEN STERILE FF (TOWEL DISPOSABLE) ×2 IMPLANT
TRAY DSU PREP LF (CUSTOM PROCEDURE TRAY) ×1 IMPLANT
TUBE CONNECTING 20X1/4 (TUBING) ×1 IMPLANT
TUBING INFILTRATION IT-10001 (TUBING) IMPLANT
TUBING SET GRADUATE ASPIR 12FT (MISCELLANEOUS) IMPLANT
UNDERPAD 30X36 HEAVY ABSORB (UNDERPADS AND DIAPERS) ×2 IMPLANT
YANKAUER SUCT BULB TIP NO VENT (SUCTIONS) ×1 IMPLANT

## 2024-07-08 NOTE — Anesthesia Procedure Notes (Signed)
 Procedure Name: Intubation Date/Time: 07/08/2024 11:30 AM  Performed by: Donnell Berwyn SQUIBB, CRNAPre-anesthesia Checklist: Patient identified, Emergency Drugs available, Suction available, Patient being monitored and Timeout performed Patient Re-evaluated:Patient Re-evaluated prior to induction Oxygen Delivery Method: Circle system utilized Preoxygenation: Pre-oxygenation with 100% oxygen Induction Type: IV induction Ventilation: Mask ventilation without difficulty Laryngoscope Size: Glidescope and 3 Grade View: Grade II Tube type: Oral Tube size: 7.0 mm Number of attempts: 2 Airway Equipment and Method: Stylet and Video-laryngoscopy Placement Confirmation: ETT inserted through vocal cords under direct vision, positive ETCO2 and breath sounds checked- equal and bilateral Secured at: 21 cm Tube secured with: Tape Dental Injury: Teeth and Oropharynx as per pre-operative assessment  Comments: Large amount of soft tissue surrounding glottic opening which bleeds easily with gentle suctioning;surgeon aware and discussion to seek ENT consult postop

## 2024-07-08 NOTE — Discharge Instructions (Addendum)
 INSTRUCTIONS FOR AFTER ABDOMINAL SURGERY  You will likely have some questions about what to expect following your operation.  The following information will help you and your family understand what to expect when you get home.  Following these guidelines will help ensure a smooth recovery and reduce risks of complications.  Postoperative instructions include information on: diet, wound care, medications and physical activity.  AFTER SURGERY Expect to go home after the procedure.  In some cases, you may need to spend one night in the hospital for observation.  DIET This surgery does not require a specific diet.  However, the healthier you eat the better your body can heal. It is important to increasing your protein intake.  Limit foods with high sugar and  carbohydrate content.  Focus on vegetables, meat and other protein sources if you are vegan or vegetarian.  If you undergo liposuction during your procedure it is very important to drink 8 oz of water  every hour while awake for 2 days.  If your urine is bright yellow, then it is concentrated, and you need to drink more water .  If you find you are persistently nauseated or unable to take in liquids let us  know.  NO TOBACCO USE or EXPOSURE.  This will slow your healing process and increase the risk of a wound.  WOUND CARE Leave the abdominal binder in place for 3 days.  Then you can remove it and shower.  Replace the binder or spanx after your shower.   You may have Topifoam or Lipofoam on.  It is soft and spongy and helps keep you from getting creases if you have liposuction.  This can be removed before the shower and then replaced.  If you need more it is available on [employer] as lipofoam.  If you have steri-strips / tape directly attached to your skin leave them in place. It is OK to get these wet.  No baths, pools or hot tubs for four weeks. We close your incision to leave the smallest and best-looking scar. No ointment or creams on your incisions  until cleared by your surgeon.  No Neosporin (Too many skin reactions with this one).  After the steri-strips are off can use Mederma or Skinuva and start massaging the scar. Continue to wear the binder/spanx or Ace wrap around the clock, including while sleeping, for 6 weeks. This provides added comfort and helps reduce the fluid accumulation at the surgery site.  ACTIVITY No heavy lifting until cleared by the doctor.  For example, no more than a half-gallon of milk.  It is OK to walk and you are encouraged to move your legs to help decrease your risk of getting a blood clot.  It will also help keep you from getting deconditioned.  Every 1 to 2 hours get up and walk for 5 minutes. This will help with a quicker recovery back to normal.  Let pain be your guide so you don't do too much.     SLEEPING / RESTING Sleeping and resting should be in the jack-knife or bent forward position with your head elevated.  This will help reduce pulling on your abdominal incision.  You can elevate your head and upper back with a few pillows and place a pillow under your knees.  Avoid stomach sleeping for 3 months.   WORK Everyone returns to work at different times. As a rough guide, most people take 1 - 2 weeks off prior to returning to work. If you need documentation for your  job give them to the front staff for processing.  DRIVING Arrange for someone to bring you home from the hospital.  You may be able to drive a few days after surgery but not while taking any narcotics or valium.  This is for your safety as well as others sharing the road with you.  BOWEL MOVEMENTS Constipation can occur after anesthesia and while taking pain medication.  It is important to stay ahead for your comfort.  We recommend taking Milk of Magnesia (2 tablespoons; twice a day) while taking the pain pills.  MEDICATIONS (you may receive and should be started after surgery) At your preoperative visit for you history and physical you were  given the following medications: Antibiotic: Start this medication when you get home and take according to the instructions on the bottle. Zofran  4 mg:  This is to treat nausea and vomiting.  You can take this every 6 hours as needed and only if needed. Oxycodone  5 mg every 6 hours for 3-5 days.  This is to be used after you have taken the Motrin  / Tylenol . 4.   Gabapentin 300 mg every 12 hours for 7 days.  Over the counter Medication to take: Ibuprofen  (Motrin ) 400 - 600 mg every 6 hour for 7 days Tylenol  500 mg every 6 hours for 7 days.  Only take the Oxycodone  after you have tried these two. MiraLAX or stool softener of choice: Take this according to the bottle if you take the Norco.  If muscle work done:  Flexeril 5 mg every 12 hours for 7 days.  WHEN TO CALL Call your surgeon's office if any of the following occur:  Fever 101 degrees F or greater  Excessive bleeding or fluid from the incision site.  Pain that increases over time without aid from the medications  Redness, warmth, or pus draining from incision sites  Persistent nausea or inability to take in liquids  Severe misshapen area that underwent the operation.    Post Anesthesia Home Care Instructions  Activity: Get plenty of rest for the remainder of the day. A responsible individual must stay with you for 24 hours following the procedure.  For the next 24 hours, DO NOT: -Drive a car -Advertising copywriter -Drink alcoholic beverages -Take any medication unless instructed by your physician -Make any legal decisions or sign important papers.  Meals: Start with liquid foods such as gelatin or soup. Progress to regular foods as tolerated. Avoid greasy, spicy, heavy foods. If nausea and/or vomiting occur, drink only clear liquids until the nausea and/or vomiting subsides. Call your physician if vomiting continues.  Special Instructions/Symptoms: Your throat may feel dry or sore from the anesthesia or the breathing tube  placed in your throat during surgery. If this causes discomfort, gargle with warm salt water . The discomfort should disappear within 24 hours.  May have Tylenol  after 3:45pm if needed.     About my Jackson-Pratt Bulb Drain  What is a Jackson-Pratt bulb? A Jackson-Pratt is a soft, round device used to collect drainage. It is connected to a long, thin drainage catheter, which is held in place by one or two small stiches near your surgical incision site. When the bulb is squeezed, it forms a vacuum, forcing the drainage to empty into the bulb.  Emptying the Jackson-Pratt bulb- To empty the bulb: 1. Release the plug on the top of the bulb. 2. Pour the bulb's contents into a measuring container which your nurse will provide. 3. Record the time emptied  and amount of drainage. Empty the drain(s) as often as your     doctor or nurse recommends.  Date                  Time                    Amount (Drain 1)                 Amount (Drain 2)  _____________________________________________________________________  _____________________________________________________________________  _____________________________________________________________________  _____________________________________________________________________  _____________________________________________________________________  _____________________________________________________________________  _____________________________________________________________________  _____________________________________________________________________  Squeezing the Jackson-Pratt Bulb- To squeeze the bulb: 1. Make sure the plug at the top of the bulb is open. 2. Squeeze the bulb tightly in your fist. You will hear air squeezing from the bulb. 3. Replace the plug while the bulb is squeezed. 4. Use a safety pin to attach the bulb to your clothing. This will keep the catheter from     pulling at the bulb insertion site.  When to call your  doctor- Call your doctor if: Drain site becomes red, swollen or hot. You have a fever greater than 101 degrees F. There is oozing at the drain site. Drain falls out (apply a guaze bandage over the drain hole and secure it with tape). Drainage increases daily not related to activity patterns. (You will usually have more drainage when you are active than when you are resting.) Drainage has a bad odor.  Information for Discharge Teaching: EXPAREL  (bupivacaine  liposome injectable suspension)   Pain relief is important to your recovery. The goal is to control your pain so you can move easier and return to your normal activities as soon as possible after your procedure. Your physician may use several types of medicines to manage pain, swelling, and more.  Your surgeon or anesthesiologist gave you EXPAREL (bupivacaine ) to help control your pain after surgery.  EXPAREL  is a local anesthetic designed to release slowly over an extended period of time to provide pain relief by numbing the tissue around the surgical site. EXPAREL  is designed to release pain medication over time and can control pain for up to 72 hours. Depending on how you respond to EXPAREL , you may require less pain medication during your recovery. EXPAREL  can help reduce or eliminate the need for opioids during the first few days after surgery when pain relief is needed the most. EXPAREL  is not an opioid and is not addictive. It does not cause sleepiness or sedation.   Important! A teal colored band has been placed on your arm with the date, time and amount of EXPAREL  you have received. Please leave this armband in place for the full 96 hours following administration, and then you may remove the band. If you return to the hospital for any reason within 96 hours following the administration of EXPAREL , the armband provides important information that your health care providers to know, and alerts them that you have received this anesthetic.     Possible side effects of EXPAREL : Temporary loss of sensation or ability to move in the area where medication was injected. Nausea, vomiting, constipation Rarely, numbness and tingling in your mouth or lips, lightheadedness, or anxiety may occur. Call your doctor right away if you think you may be experiencing any of these sensations, or if you have other questions regarding possible side effects.  Follow all other discharge instructions given to you by your surgeon or nurse. Eat a healthy diet and drink plenty of water  or other fluids.

## 2024-07-08 NOTE — Transfer of Care (Signed)
 Immediate Anesthesia Transfer of Care Note  Patient: Erin Good  Procedure(s) Performed: PANNICULECTOMY, ABDOMINAL, WITH LIPOSUCTION (Abdomen)  Patient Location: PACU  Anesthesia Type:General  Level of Consciousness: awake and patient cooperative  Airway & Oxygen Therapy: Patient Spontanous Breathing and Patient connected to nasal cannula oxygen  Post-op Assessment: Report given to RN and Post -op Vital signs reviewed and stable  Post vital signs: Reviewed and stable  Last Vitals:  Vitals Value Taken Time  BP 114/60 07/08/24 13:40  Temp    Pulse 85 07/08/24 13:45  Resp 13 07/08/24 13:45  SpO2 97 % 07/08/24 13:45  Vitals shown include unfiled device data.  Last Pain:  Vitals:   07/08/24 0932  TempSrc: Temporal  PainSc: 0-No pain         Complications: No notable events documented.

## 2024-07-08 NOTE — Op Note (Signed)
 Operative Report  Date of operation: 07/08/2024  Patient: Erin Good, MRN: [id number], 48 y.o. female.   Date of birth: 12/17/1975  Location: [facility]  Preoperative Diagnosis: Panniculitis  Postoperative Diagnosis:  Same  Procedure: Panniculectomy  Surgeon:  Dr. Estefana Fritter  Assistant:  Honora Seip, PA  Anesthesia:  General  EBL:  100 cc  Drains:  2 number 19 blake round drains  Condition:  Stable  Complications: None  Disposition: Recovery Room  Procedure in Detail: Patient was seen the morning of her surgery and marked out for the procedure. She was then given an IV and IV antibiotics. The patient was taken to the operating room and underwent general anesthesia.  A time out was called and all information was confirmed to be correct. SCD's and a pillow under the knees was in place. The patient was then prepped and draped in the standard sterile fashion. Local was placed into the incision and 2 stab incisions made through which 100 cc of tumescent was placed in each flank area. The flanks were liposuctioned and 150 cc removed from the sides. The planned lower incision was then incised and the incision taken down through the Scarpa's fascia to the rectus abdominus fascia. The skin and subcutaneous tissue was then lifted off the fascia up to the level of the umbilicus. The umbilicus is then circumscribed and freed up from the surrounding skin and freed down to the abdominal wall. The abdominal wall was then freed above the umbilicus but not widely up to the level of the xiphoid.  The amount that could be excised was confirmed. The pannus was excised and it weighed 3110 grams. The mons was also suspended with 3-0 Monocryl.  The wound was irrigated with normal saline solution. Experel and myriad were placed in the pocket. Two #19 blake round drains were placed and secured with 3-0 Silk.The abdominal wall was closed with buried 3-0 PDS and  subcuticular 3-0 Monocryl.  The wound was then dressed with dermabond. ABD's and an abdominal binder were placed. Patient was allowed to wake up, extubated and taken on a stretcher in the flexed position to the recovery room. Family was notified at the end of the case.   The advanced practice practitioner (APP) assisted throughout the case.  The APP was essential in retraction and counter traction when needed to make the case progress smoothly.  This retraction and assistance made it possible to see the tissue plans for the procedure.  The assistance was needed for blood control, tissue re-approximation and assisted with closure of the incision site.

## 2024-07-08 NOTE — Anesthesia Postprocedure Evaluation (Signed)
 Anesthesia Post Note  Patient: Erin Good  Procedure(s) Performed: PANNICULECTOMY, ABDOMINAL, WITH LIPOSUCTION (Abdomen)     Patient location during evaluation: PACU Anesthesia Type: General Level of consciousness: awake and alert Pain management: pain level controlled Vital Signs Assessment: post-procedure vital signs reviewed and stable Respiratory status: spontaneous breathing, nonlabored ventilation and respiratory function stable Cardiovascular status: blood pressure returned to baseline and stable Postop Assessment: no apparent nausea or vomiting Anesthetic complications: no   No notable events documented.  Last Vitals:  Vitals:   07/08/24 1545 07/08/24 1600  BP: 114/66 116/75  Pulse: 69 72  Resp: 14 15  Temp:  (!) 36.3 C  SpO2: 96% 95%    Last Pain:  Vitals:   07/08/24 1600  TempSrc:   PainSc: 3                  Phung Kotas,W. EDMOND

## 2024-07-08 NOTE — Anesthesia Preprocedure Evaluation (Addendum)
 Anesthesia Evaluation  Patient identified by MRN, date of birth, ID band Patient awake    Reviewed: Allergy & Precautions, H&P , NPO status , Patient's Chart, lab work & pertinent test results  Airway Mallampati: II  TM Distance: >3 FB Neck ROM: Full    Dental no notable dental hx. (+) Teeth Intact, Dental Advisory Given   Pulmonary    Pulmonary exam normal breath sounds clear to auscultation       Cardiovascular hypertension,  Rhythm:Regular Rate:Normal     Neuro/Psych Seizures -, Well Controlled,   negative psych ROS   GI/Hepatic Neg liver ROS,GERD  ,,  Endo/Other  diabetes, Type 2, Oral Hypoglycemic Agents  Class 3 obesity  Renal/GU negative Renal ROS  negative genitourinary   Musculoskeletal  (+) Arthritis , Osteoarthritis,    Abdominal   Peds  Hematology negative hematology ROS (+)   Anesthesia Other Findings   Reproductive/Obstetrics negative OB ROS                              Anesthesia Physical Anesthesia Plan  ASA: 3  Anesthesia Plan: General   Post-op Pain Management: Tylenol  PO (pre-op)*   Induction: Intravenous  PONV Risk Score and Plan: 4 or greater and Ondansetron , Dexamethasone  and Midazolam   Airway Management Planned: Oral ETT  Additional Equipment:   Intra-op Plan:   Post-operative Plan: Extubation in OR  Informed Consent: I have reviewed the patients History and Physical, chart, labs and discussed the procedure including the risks, benefits and alternatives for the proposed anesthesia with the patient or authorized representative who has indicated his/her understanding and acceptance.     Dental advisory given  Plan Discussed with: CRNA  Anesthesia Plan Comments:          Anesthesia Quick Evaluation

## 2024-07-08 NOTE — Interval H&P Note (Signed)
 History and Physical Interval Note:  07/08/2024 10:27 AM  Erin Good  has presented today for surgery, with the diagnosis of Hypertrophy of breast.  The various methods of treatment have been discussed with the patient and family. After consideration of risks, benefits and other options for treatment, the patient has consented to  Procedure(s): PANNICULECTOMY, ABDOMINAL, WITH LIPOSUCTION (N/A) as a surgical intervention.  The patient's history has been reviewed, patient examined, no change in status, stable for surgery.  I have reviewed the patient's chart and labs.  Questions were answered to the patient's satisfaction.     Estefana RAMAN [name]

## 2024-07-09 ENCOUNTER — Telehealth: Payer: Self-pay

## 2024-07-09 ENCOUNTER — Encounter (HOSPITAL_BASED_OUTPATIENT_CLINIC_OR_DEPARTMENT_OTHER): Payer: Self-pay | Admitting: Plastic Surgery

## 2024-07-09 NOTE — Telephone Encounter (Signed)
 The patients daughter left a voicemail stating that her mom collapsed this morning. She was wondering if it had something to do with her blood sugar. I called the patient back and she stated that she felt weak this morning and just fell but her son caught her. Her volume output in her drains have been stable on both sides. She states that she feels fine now. I instructed her to continue to drink fluids and get some food. I also instructed her to check her blood sugars and to call us . I offered her an appointment today but she said she was feeling better now and didn't think she needed to come.

## 2024-07-09 NOTE — Telephone Encounter (Signed)
 I called the patient back to check in and see what her blood sugars were. The patient informed me that she was able to eat and her blood sugar when she checked it was 120. She stated she is feeling better. She denies pain at the incision site and she denies any increase in drain output. I let the patient know we have an on call service so she can call at anytime and I told her if she starts to feel symptomatic again that she should go to the emergency room. Patient conveyed understanding.

## 2024-07-15 ENCOUNTER — Ambulatory Visit (INDEPENDENT_AMBULATORY_CARE_PROVIDER_SITE_OTHER): Admitting: Physician Assistant

## 2024-07-15 VITALS — BP 109/79 | HR 117

## 2024-07-15 DIAGNOSIS — Z9889 Other specified postprocedural states: Secondary | ICD-10-CM

## 2024-07-15 MED ORDER — DOXYCYCLINE HYCLATE 100 MG PO TABS: 100.0000 mg | ORAL_TABLET | Freq: Two times a day (BID) | ORAL | 0 refills | Status: DC

## 2024-07-15 NOTE — Progress Notes (Signed)
 Patient is a pleasant 48 year old female s/p panniculectomy performed 07/08/2024 Dr. Lowery who returns to clinic for postoperative follow-up.  Reviewed operative report and 3110 g pannus was excised at time of surgery.  Liposuction was also performed to lateral aspects bilaterally.  Today, patient is doing okay.  She is accompanied by daughter at bedside was been assisting with her postoperative recovery.  Drain output has been approximately 80 cc/day on the right side and 50 cc/day on the left side for the past couple of days.  However, she woke up and noticed that her left bulb was not holding a charge.  She was also having drainage from around the drain tube insertion site.  She does have some tenderness and discomfort along the right side of her incision.  She denies any fevers, chest pain, leg swelling, or difficulty breathing.  She is tolerating p.o. intake without difficulty.  She is voiding urine and BM.  Ambulatory at home.  On exam, abdomen is soft and nondistended.  She does have some tenderness as well as minimal erythema along the right side of her panniculectomy incision.  Mild amount of incisional drainage throughout, as well.  Mons is appropriately swollen after panniculectomy, likely dependent.  Right drain is intact and functional with normal serosanguineous output.  Left drain however appears to have self extricated.  Given that left drain is at least 4 inches self-extricated, at this time we will remove here at bedside.  She understands that she will likely have ongoing drainage from the drain tube insertions on the left for which she will have to apply maxi pads and ABD pads.  Ultimately this will heal over without complication.  She can expect some compensatory increased output from the right drain tube.  Discussed with Dr. Lowery who is agreeable.  Left drain removed without complication or difficulty, well-tolerated by patient.  Bordered Mepilex dressings are replaced  throughout the incision.  Will start her on doxycycline  given her tenderness and minimal erythema along right side of panniculectomy incision.  No systemic symptoms.  Follow-up in 1 week, sooner if needed.  She will call the office should she have any concerns in the interim.

## 2024-07-16 ENCOUNTER — Encounter: Admitting: Student

## 2024-07-17 ENCOUNTER — Encounter: Admitting: Physician Assistant

## 2024-07-18 ENCOUNTER — Encounter: Payer: Self-pay | Admitting: Plastic Surgery

## 2024-07-18 MED ORDER — DOXYCYCLINE HYCLATE 100 MG PO TABS: 100.0000 mg | ORAL_TABLET | Freq: Two times a day (BID) | ORAL | 0 refills | Status: AC

## 2024-07-21 ENCOUNTER — Encounter: Payer: Self-pay | Admitting: Plastic Surgery

## 2024-07-21 ENCOUNTER — Ambulatory Visit (INDEPENDENT_AMBULATORY_CARE_PROVIDER_SITE_OTHER): Admitting: Plastic Surgery

## 2024-07-21 VITALS — BP 130/81 | HR 107

## 2024-07-21 DIAGNOSIS — M546 Pain in thoracic spine: Secondary | ICD-10-CM

## 2024-07-21 DIAGNOSIS — G8929 Other chronic pain: Secondary | ICD-10-CM

## 2024-07-21 DIAGNOSIS — M793 Panniculitis, unspecified: Secondary | ICD-10-CM

## 2024-07-21 DIAGNOSIS — R928 Other abnormal and inconclusive findings on diagnostic imaging of breast: Secondary | ICD-10-CM

## 2024-07-21 NOTE — Progress Notes (Signed)
 The patient is a 48 year old female here for follow-up after undergoing a panniculectomy on September 3.  She had over 3000 g removed from her abdomen.  Overall she is doing really well.  No sign of a hematoma or seroma.  The left drain was dislodged and removed.  The right side is still in and the volume is still around 50 or 60 cc in a day.  Will keep that in for another week. Continue with the binder.

## 2024-07-31 ENCOUNTER — Ambulatory Visit: Admitting: Plastic Surgery

## 2024-07-31 ENCOUNTER — Encounter: Payer: Self-pay | Admitting: Plastic Surgery

## 2024-07-31 DIAGNOSIS — G8929 Other chronic pain: Secondary | ICD-10-CM

## 2024-07-31 DIAGNOSIS — M546 Pain in thoracic spine: Secondary | ICD-10-CM

## 2024-07-31 NOTE — Progress Notes (Signed)
 The patient is a 48 year old female here for follow-up after undergoing a panniculectomy on September 3.  She had over 3000 g removed from her abdomen.  I do feel a little bit of fluid in the right abdominal area.  The drain still in and draining about 50 cc/day.  I am and I give that a little more time to get her through the weekend and then we can hopefully get that out.  But her incisions are healing really nicely and no sign of infection.

## 2024-08-04 ENCOUNTER — Ambulatory Visit (INDEPENDENT_AMBULATORY_CARE_PROVIDER_SITE_OTHER): Admitting: Surgical

## 2024-08-04 VITALS — BP 123/83

## 2024-08-04 DIAGNOSIS — M546 Pain in thoracic spine: Secondary | ICD-10-CM

## 2024-08-04 DIAGNOSIS — G8929 Other chronic pain: Secondary | ICD-10-CM

## 2024-08-04 DIAGNOSIS — Z9889 Other specified postprocedural states: Secondary | ICD-10-CM

## 2024-08-04 DIAGNOSIS — M793 Panniculitis, unspecified: Secondary | ICD-10-CM

## 2024-08-04 NOTE — Progress Notes (Signed)
 Patient is a very pleasant 48 year old female here for follow-up after panniculectomy with Dr. Lowery on 07/08/2024.  She is here for reevaluation of right JP drain.  She has continued to have about 50 cc of serous output per 24 hours.  She was last seen here in the office about 4 days ago and reports minimal change to the output.  She is not having any abdominal fullness.  She has overall been healing well.  She has returned to work and works from home sitting at a computer and had noticed some swelling of her left leg but reports this has resolved.  She does not have any pain.  Chaperone present on exam On exam patient is well-developed, well-nourished, no acute distress.  Breathing is unlabored.  Bilateral lower extremities without edema.  No tenderness noted with palpation of left lower extremity compartments.  Abdominal incision is overall intact and appears to be healing well.  She does have a slight incisional separation on the right lateral aspect that is approximately 7 x 0.2 cm.  There is no surrounding erythema or cellulitic changes.  There is no active drainage from the incision.  Abdomen is soft and nontender.  No obvious subcutaneous fluid collections noted palpation.  Right JP drain is in place with serous drainage in bulb.  A/P:  Right JP drain was removed, discussed with patient the risks versus benefits of leaving the drain in longer.  We discussed that with drain removal, it is possible that she may need to undergo ultrasound with needle guided aspiration if she suddenly has an increased amount of fluid that collects within the abdominal/surgical space.  We discussed that there is also risks with leaving the drain in place including risk of infection.  After having a thorough discussion, we both elected to remove the right JP drain and have her follow-up next week for close reevaluation.  We discussed the importance of compressive garments and avoiding any significantly increased  activity.  Recommend Vaseline to right JP drain insertion site wound and right abdominal incision wound.  Call with questions or concerns.  No signs of infection or concern on exam. Pictures were obtained of the patient and placed in the chart with the patient's or guardian's permission.

## 2024-08-11 ENCOUNTER — Ambulatory Visit: Admitting: Surgical

## 2024-08-11 VITALS — BP 120/77 | HR 87

## 2024-08-11 DIAGNOSIS — Z9889 Other specified postprocedural states: Secondary | ICD-10-CM

## 2024-08-11 DIAGNOSIS — G8929 Other chronic pain: Secondary | ICD-10-CM

## 2024-08-11 DIAGNOSIS — M546 Pain in thoracic spine: Secondary | ICD-10-CM

## 2024-08-11 DIAGNOSIS — M793 Panniculitis, unspecified: Secondary | ICD-10-CM

## 2024-08-11 NOTE — Progress Notes (Signed)
 Patient is a 48 year old female here for follow-up after panniculectomy Dr. Lowery on 07/08/2024.  She was last seen here in the office about 7 days ago and was overall doing well.  She does have a small wound of the right lateral abdomen that she has been managing with Vaseline and gauze.  She has also been changing the Mepilex over her right JP drain site.  She reports she is overall doing well, she is not having any specific concerns or questions today.  She is not having any infectious symptoms.  She continues to notice some mild swelling of her left lower extremity at the end of the day, she reports in the mornings it is not swollen.  She is not having any pain or any other concerns.  No tenderness with palpation.  Chaperone present on exam On exam abdominal incision is overall intact, she does have a wound of the right lateral abdomen that is very thin extending across the panniculectomy incision about 6 cm.  It is not very deep and is about 1 to 2 mm.  There is no surrounding erythema.  There is no tenderness to palpation of her abdomen.  There is no active drainage from her incision or from the JP drain insertion site.  She has no erythema of her abdomen.  No obvious subcutaneous fluid collection with palpation.  A/P:  Patient is overall doing well, no signs infection or concern on exam.  Continue with gauze to abdominal incision wound and JP drain insertion site wound. Recommend calling with questions or concerns, follow-up in 2 weeks for reevaluation.  Continue with compressive garments and avoid strenuous activities or heavy lifting.

## 2024-08-14 ENCOUNTER — Encounter: Admitting: Surgical

## 2024-08-18 ENCOUNTER — Ambulatory Visit
Admission: RE | Admit: 2024-08-18 | Discharge: 2024-08-18 | Disposition: A | Discharge status: Home / Self Care | Source: Ambulatory Visit | Attending: Emergency Medicine | Admitting: Emergency Medicine

## 2024-08-18 VITALS — BP 140/85 | HR 89 | Temp 98.4°F | Resp 18

## 2024-08-18 DIAGNOSIS — N390 Urinary tract infection, site not specified: Secondary | ICD-10-CM | POA: Diagnosis not present

## 2024-08-18 LAB — URINALYSIS, W/ REFLEX TO CULTURE (INFECTION SUSPECTED)
Bilirubin Urine: NEGATIVE
Glucose, UA: NEGATIVE mg/dL
Ketones, ur: NEGATIVE mg/dL
Nitrite: NEGATIVE
Protein, ur: NEGATIVE mg/dL
RBC / HPF: >50 RBC/hpf (ref 0–5)
Specific Gravity, Urine: 1.02 (ref 1.005–1.030)
pH: 6 (ref 5.0–8.0)

## 2024-08-18 MED ORDER — PHENAZOPYRIDINE HCL 200 MG PO TABS: 200.0000 mg | ORAL_TABLET | Freq: Three times a day (TID) | ORAL | 0 refills | Status: DC

## 2024-08-18 MED ORDER — NITROFURANTOIN MONOHYD MACRO 100 MG PO CAPS: 100.0000 mg | ORAL_CAPSULE | Freq: Two times a day (BID) | ORAL | 0 refills | Status: DC

## 2024-08-18 NOTE — ED Triage Notes (Signed)
 Pt states she has some dysuria and increased urine frequency X 2 days. She has not taken any meds for sx.

## 2024-08-18 NOTE — Discharge Instructions (Signed)

## 2024-08-18 NOTE — ED Provider Notes (Signed)
 [facility]    CSN: [id number] Arrival date & time: 08/18/24  1106      History   Chief Complaint Chief Complaint  Patient presents with   Urinary Frequency    Possible UTI - Entered by patient   Dysuria    HPI Erin Good is a 48 y.o. female.   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  She denies any fever, blood in the urine, or vaginal discharge.  Past Medical History:  Diagnosis Date   Allergic rhinitis    Breast discharge 06/05/2017   2 weeks ago left   Breast mass 12/06/2016   left   COVID-19 virus infection 11/2020   Diabetes mellitus without complication (HCC)    Dyslipidemia    Epilepsy (HCC)    last seizure 11/06/2023   Galactorrhea    Hx gestational diabetes    Hypertension    Kidney stones    Morbid obesity (HCC)    Sleep apnea    Syncope and collapse    Tachycardia     Patient Active Problem List   Diagnosis Date Noted   Back pain 06/01/2024   Panniculitis 06/01/2024   History of abnormal cervical Pap smear 01/29/2024   Menorrhagia with irregular cycle 01/29/2024   Diabetes mellitus without complication (HCC) 01/29/2024   Abnormal screening mammogram 01/29/2024   Obesity, Class III, BMI 40-49.9 (morbid obesity) (HCC) 11/06/2023   Encounter for well woman exam with routine gynecological exam 09/26/2022   Morbid obesity (HCC)    Impingement syndrome of left shoulder region 04/20/2020   Vitamin D  deficiency 01/31/2017   Bell's palsy 12/19/2016   Allergic rhinitis 04/20/2015   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   Gastro-esophageal reflux disease without  esophagitis 04/20/2015   NASH (nonalcoholic steatohepatitis) 04/20/2015   Allergy to nuts 04/20/2015   Calculus of kidney 04/20/2015   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   Dyslipidemia 07/02/2008   Leukocytosis 07/29/2007    Past Surgical History:  Procedure Laterality Date   ABDOMINOPLASTY/PANNICULECTOMY WITH LIPOSUCTION N/A 07/08/2024   Procedure: PANNICULECTOMY, ABDOMINAL, WITH LIPOSUCTION;  Surgeon: Lowery Estefana RAMAN, DO;  Location: [facility];  Service: Plastics;  Laterality: N/A;   BREAST BIOPSY Left 2018   benign   CESAREAN SECTION     X 2   COLONOSCOPY WITH PROPOFOL  N/A 09/26/2022   Procedure: COLONOSCOPY WITH PROPOFOL ;  Surgeon: Unk Corinn Skiff, MD;  Location: [facility];  Service: Gastroenterology;  Laterality: N/A;   IRRIGATION AND DEBRIDEMENT SHOULDER Left 10/12/2020   Procedure: IRRIGATION AND DEBRIDEMENT SHOULDER;  Surgeon: Cristy Bonner DASEN, MD;  Location: [facility];  Service: Orthopedics;  Laterality: Left;   RIGHT OOPHORECTOMY Right 2001   benign tumor   TUBAL LIGATION  2007    OB History     Gravida  2   Para  2   Term  0   Preterm  2   AB  0   Living  2      SAB  0   IAB  0   Ectopic  0   Multiple      Live Births  2  Obstetric Comments  Menstrual age: 52  Age 1st Pregnancy: 21            Home Medications    Prior to Admission medications   Medication Sig Start Date End Date Taking? Authorizing Provider  Cyanocobalamin (B-12) 500 MCG SUBL Place 1 tablet under the tongue daily at 12 noon. 01/03/24  Yes Sowles, Krichna, MD  folic acid  (FOLVITE ) 1 MG tablet Take 1 tablet (1 mg total) by mouth daily. 01/03/24  Yes Sowles, Krichna, MD  metFORMIN  (GLUCOPHAGE -XR) 500 MG 24 hr tablet Take 4 tablets (2,000 mg total) by mouth daily with supper. 05/13/24  Yes Trixie File, MD  MOUNJARO  15 MG/0.5ML Pen INJECT THE CONTENTS OF ONE PEN  SUBCUTANEOUSLY WEEKLY AS  DIRECTED 04/29/24  Yes  Trixie File, MD  nitrofurantoin, macrocrystal-monohydrate, (MACROBID) 100 MG capsule Take 1 capsule (100 mg total) by mouth 2 (two) times daily. 08/18/24  Yes Bernardino Ditch, NP  phenazopyridine (PYRIDIUM) 200 MG tablet Take 1 tablet (200 mg total) by mouth 3 (three) times daily. 08/18/24  Yes Bernardino Ditch, NP  rosuvastatin  (CRESTOR ) 20 MG tablet Take 1 tablet (20 mg total) by mouth daily. 10/31/20  Yes Trixie File, MD  zonisamide  (ZONEGRAN ) 100 MG capsule Take 400 mg by mouth at bedtime. 08/15/23  Yes [provider]  Accu-Chek Softclix Lancets lancets Use as instructed 1x a day 05/13/24   Trixie File, MD  Blood Glucose Monitoring Suppl (ACCU-CHEK GUIDE) w/Device KIT Use as advised 05/13/24   Trixie File, MD  Cholecalciferol (VITAMIN D ) 50 MCG (2000 UT) CAPS Take 1 capsule by mouth daily at 12 noon.    [provider]  clonazePAM  (KLONOPIN ) 0.5 MG tablet Take 0.5 mg by mouth daily as needed. 08/15/23   [provider]  EPINEPHrine  0.3 mg/0.3 mL IJ SOAJ injection Inject 0.3 mg into the muscle as needed for anaphylaxis (for anaphylaxis).  06/14/14   [provider]  glucose blood test strip Use as instructed 1x a day 05/13/24   Trixie File, MD  Insulin  Pen Needle 32G X 4 MM MISC Use 1x a day 07/03/19   Trixie File, MD  ketoconazole  (NIZORAL ) 2 % cream Apply 1 Application topically daily. 01/03/24   Sowles, Krichna, MD    Family History Family History  Problem Relation Age of Onset   Diabetes Mother    Healthy Father    Cancer Maternal Grandmother    Breast cancer Paternal Grandmother 49   Cancer Paternal Grandmother    Cancer Paternal Aunt    Heart disease Neg Hx     Social History Social History   Tobacco Use   Smoking status: Never   Smokeless tobacco: Never  Vaping Use   Vaping status: Never Used  Substance Use Topics   Alcohol use: Not Currently    Comment: occ   Drug use: Not Currently     Allergies   Peanuts  [peanut oil] and Aspirin   Review of Systems Review of Systems  Constitutional:  Negative for fever.  Gastrointestinal:  Positive for abdominal pain, nausea and vomiting.  Genitourinary:  Positive for dysuria, frequency, urgency and vaginal pain. Negative for hematuria and vaginal discharge.  Musculoskeletal:  Positive for back pain.     Physical Exam Triage Vital Signs ED Triage Vitals  Encounter Vitals Group     BP      Girls Systolic BP Percentile      Girls Diastolic BP Percentile      Boys Systolic BP Percentile  Boys Diastolic BP Percentile      Pulse      Resp      Temp      Temp src      SpO2      Weight      Height      Head Circumference      Peak Flow      Pain Score      Pain Loc      Pain Education      Exclude from Growth Chart    No data found.  Updated Vital Signs BP (!) 140/85 (BP Location: Right Arm)   Pulse 89   Temp 98.4 F (36.9 C) (Oral)   Resp 18   LMP  (LMP Unknown) Comment: none since 06/2024  SpO2 99%   Visual Acuity Right Eye Distance:   Left Eye Distance:   Bilateral Distance:    Right Eye Near:   Left Eye Near:    Bilateral Near:     Physical Exam Vitals and nursing note reviewed.  Constitutional:      Appearance: Normal appearance. She is not ill-appearing.  Cardiovascular:     Rate and Rhythm: Normal rate and regular rhythm.     Pulses: Normal pulses.     Heart sounds: Normal heart sounds. No murmur heard.    No friction rub. No gallop.  Pulmonary:     Effort: Pulmonary effort is normal.     Breath sounds: Normal breath sounds. No wheezing, rhonchi or rales.  Abdominal:     Palpations: Abdomen is soft.     Tenderness: There is no abdominal tenderness. There is no right CVA tenderness, left CVA tenderness, guarding or rebound.  Skin:    General: Skin is warm and dry.     Capillary Refill: Capillary refill takes less than 2 seconds.     Findings: No rash.  Neurological:     General: No focal deficit present.      Mental Status: She is alert and oriented to person, place, and time.      UC Treatments / Results  Labs (all labs ordered are listed, but only abnormal results are displayed) Labs Reviewed  URINALYSIS, W/ REFLEX TO CULTURE (INFECTION SUSPECTED) - Abnormal; Notable for the following components:      Result Value   APPearance HAZY (*)    Hgb urine dipstick MODERATE (*)    Leukocytes,Ua SMALL (*)    Bacteria, UA FEW (*)    All other components within normal limits  URINE CULTURE    EKG   Radiology No results found.  Procedures Procedures (including critical care time)  Medications Ordered in UC Medications - No data to display  Initial Impression / Assessment and Plan / UC Course  I have reviewed the triage vital signs and the nursing notes.  Pertinent labs & imaging results that were available during my care of the patient were reviewed by me and considered in my medical decision making (see chart for details).   Patient is a nontoxic-appearing 48 year old female resenting for evaluation of 2 days worth of UTI symptoms as outlined in HPI above.  She also endorses some vaginal itching but denies any vaginal discharge.  I will order a urinalysis to assess for the presence of UTI.  Urinalysis shows hazy appearance with moderate hemoglobin and small leukocyte esterase.  Negative for nitrates, protein, ketones, or glucose.  Reflex microscopy shows 11-20 WBCs, greater than 50 RBCs, few bacteria with WBC clumps and mucus  present.  Urine will reflex to culture.  I will discharge patient on the diagnosis of UTI started on Macrobid 100 mg twice daily for 5 days along with Pyridium 200 mg every 8 hours as needed for urinary discomfort.  We will send urine for culture and adjust antibiotics accordingly.  She should increase her oral fluid intake so she increases urine production to help flush her urinary tract.  ER and return precautions reviewed.   Final Clinical Impressions(s) / UC  Diagnoses   Final diagnoses:  Lower urinary tract infectious disease     Discharge Instructions      Take the Macrobid twice daily for 5 days with food for treatment of urinary tract infection.  Use the Pyridium every 8 hours as needed for urinary discomfort.  This will turn your urine a bright red-orange.  Increase your oral fluid intake so that you increase your urine production and or flushing your urinary system.  Take an over-the-counter probiotic, such as Culturelle-Align-Activia, 1 hour after each dose of antibiotic to prevent diarrhea or yeast infections from forming.  We will culture urine and change the antibiotics if necessary.  Return for reevaluation, or see your primary care provider, for any new or worsening symptoms.      ED Prescriptions     Medication Sig Dispense Auth. Provider   nitrofurantoin, macrocrystal-monohydrate, (MACROBID) 100 MG capsule Take 1 capsule (100 mg total) by mouth 2 (two) times daily. 10 capsule Bernardino Ditch, NP   phenazopyridine (PYRIDIUM) 200 MG tablet Take 1 tablet (200 mg total) by mouth 3 (three) times daily. 6 tablet Bernardino Ditch, NP      PDMP not reviewed this encounter.   Bernardino Ditch, NP 08/18/24 1153

## 2024-08-19 LAB — URINE CULTURE

## 2024-08-21 ENCOUNTER — Ambulatory Visit (HOSPITAL_COMMUNITY): Payer: Self-pay

## 2024-08-25 ENCOUNTER — Encounter: Payer: Self-pay | Admitting: Surgical

## 2024-08-25 ENCOUNTER — Ambulatory Visit (INDEPENDENT_AMBULATORY_CARE_PROVIDER_SITE_OTHER): Admitting: Surgical

## 2024-08-25 VITALS — BP 136/81 | HR 84 | Temp 98.6°F | Ht 67.0 in | Wt 245.0 lb

## 2024-08-25 DIAGNOSIS — M793 Panniculitis, unspecified: Secondary | ICD-10-CM

## 2024-08-25 DIAGNOSIS — Z9889 Other specified postprocedural states: Secondary | ICD-10-CM

## 2024-08-25 NOTE — Progress Notes (Signed)
 Patient is a 48 year old female here for follow-up after panniculectomy with Dr. Lowery on 07/08/2024.  She is overall doing well. She does continue to have some firmness/fat necrosis of the right abdomen just below the incision.    She is not having any infectious symptoms and does not report any other concerns.  Chaperone present on exam On exam abdominal incision is overall intact, does have 3 small wounds of the right lateral abdomen incisions that are approximately 1 x 1 mm each.  There is no surrounding erythema or cellulitic changes.  There is no tenderness in this area.  She does have an area of firmness of the right abdomen just below panniculectomy incision -exam consistent with fat necrosis.  No overlying skin changes.  Mild tenderness noted.  A/P:  Discussed with patient recommendations for massage to area of fat necrosis, discussed etiology of this, discussed that they will typically dissolve and soften with time, but massage can significantly help. Recommend avoiding submerging incision in water  for another few weeks as the small 1 x 1 mm wounds heal.  There is no signs infection or concern on exam.  Continue with compression at patient discretion.  Increase activity as tolerated.  Recommend following up as needed, call with questions or concerns.

## 2024-09-15 NOTE — Progress Notes (Deleted)
 Patient ID: Erin Good, female   DOB: 01-Feb-1976, 48 y.o.   MRN: [id number]   HPI: Erin Good is a 48 y.o.-year-old female, initially referred by her PCP, Dr. Glenard, presenting for follow-up for DM2, dx in ~2015, now noninsulin-dependent since 2019, uncontrolled, with complications (diabetic retinopathy, microalbuminuria) and also for thyroid  nodules.  Last visit 4 months ago.  Interim history: No increased urination, blurry vision, nausea, chest pain.  She lost approximately 70 pounds after starting Mounjaro , before last visit.  Reviewed HbA1c levels: Lab Results  Component Value Date   HGBA1C 5.2 (B) 05/13/2024   HGBA1C 5.7 (A) 01/07/2024   HGBA1C 5.9 (A) 07/04/2023   HGBA1C 5.8 (A) 01/03/2023   HGBA1C 6.2 (A) 09/04/2022   HGBA1C 6.5 (A) 04/04/2022   HGBA1C 7.8 (A) 12/01/2021   HGBA1C 9.7 (A) 09/01/2021   HGBA1C 7.6 (H) 10/03/2020   HGBA1C 7.9 (A) 08/15/2020   Pt was on a regimen of: - Metformin  ER 750 mg 2x a day, with meals - Soliqua  (Lantus  + Lixisenatide ) 50 units daily in am  Currently on: - Metformin  ER 750 mg 2x a day with meals >> 1500 >> 2000 mg with dinner -  >> stopped 05/2024 - Mounjaro  7.5 >> 10 >> 12.5 >> 15 mg weekly  Tried Farxiga  06/2020 >> not covered. She was previously on Mounjaro .  She is not checking blood sugars - no meter. From before: - am:  110-130 >> 87-110 >> 90-110, 120 >> 100-110 - 2h after b'fast: 166, 207 >> n/c >> 150-158 >> n/c - before lunch:   n/c >> 120-140 >> 110-140, 150 >> n/c  - 2h after lunch: 125-153 >> n/c >> 150s >> 140s >> <140 - before dinner: 70s, 120-140 >> 110-140, 150 >> 60 (flu) >> n/c - 2h after dinner: n/c >> 130-137, 180 >> <140, 160 - bedtime:170-330 >> N/c >> 76 if skips the meal >> n/c - nighttime: n/c Lowest sugar was 90 >> 100 >> ?; she has hypoglycemia awareness at 100. Highest sugar was 330 >> .SABRASABRA  180 (Covid) >> 160 >> ?[name]  Glucometer: [facility] advance  Pt's meals are: - Breakfast: 2 eggs  + 3 pieces of bacon >> 2 eggs - snack: fruit - Lunch: salad - Dinner:meat + veggie + starch (rice + pasta) - Snacks: 1   -No CKD, last BUN/creatinine:  Lab Results  Component Value Date   BUN 8 07/03/2024   BUN 13 12/30/2023   CREATININE 0.85 07/03/2024   CREATININE 0.90 04/22/2024  Not on ACE inhibitor/ARB.  ACR levels were slightly elevated: Lab Results  Component Value Date   MICRALBCREAT 12 05/13/2024   MICRALBCREAT 33 (H) 11/12/2019   MICRALBCREAT 30 (H) 01/29/2019   -+ HL; last set of lipids: Lab Results  Component Value Date   CHOL 229 (H) 12/30/2023   HDL 47 (L) 12/30/2023   LDLCALC 159 (H) 12/30/2023   TRIG 112 12/30/2023   CHOLHDL 4.9 12/30/2023  Previously on Crestor  10 mg daily and I advised her to increase the dose to 20 mg daily. She takes this now.  - last eye exam was in 2024: ? DR (need to get the report).  - no numbness and tingling in her feet.  Occasional numbness in the right leg and foot.  Latest foot exam was on 05/13/2024.  Pt has FH of DM in mother, MGF.  Thyroid  nodules:  Reviewed previous work-up: 06/01/2019: Thyroid  ultrasound: Parenchymal Echotexture: Mildly heterogenous Isthmus: 0.4 cm thickness Right lobe:  5 x 2.4 x 2.8 cm Left lobe: 4.5 x 2 x 2.1 cm    Estimated total number of nodules >/= 1 cm: 4   Nodule # 1: Location: Right; Mid Maximum size: 1.6 cm; Other 2 dimensions: 1.5 x 1.4 cm Composition: mixed cystic and solid (1) Echogenicity: hypoechoic (2) *Given size (>/= 1.5 - 2.4 cm) and appearance, a follow-up ultrasound in 1 year should be considered based on TI-RADS criteria. _________________________________________________________   Nodule # 2: Location: Right; Inferior Maximum size: 2.3 cm; Other 2 dimensions: 1.9 x 1.9 cm Composition: solid/almost completely solid (2) Echogenicity: hypoechoic (2)  **Given size (>/= 1.5 cm) and appearance, fine needle aspiration of this moderately suspicious nodule should be considered  based on TI-RADS criteria. _________________________________________________   Nodule # 3: Location: Left; Inferior Maximum size: 1.4 cm; Other 2 dimensions: 1.2 x 1.2 cm Composition: solid/almost completely solid (2) Echogenicity: isoechoic (1)  Given size (<1.4 cm) and appearance, this nodule does NOT meet TI-RADS criteria for biopsy or dedicated follow-up. _________________________________________________________   Nodule # 4: Location: Left; Mid Maximum size: 1.8 cm; Other 2 dimensions: 1.6 x 1.3 cm Composition: solid/almost completely solid (2) Echogenicity: hypoechoic (2) **Given size (>/= 1.5 cm) and appearance, fine needle aspiration of this moderately suspicious nodule should be considered based on TI-RADS criteria.   IMPRESSION: 1. Thyromegaly with bilateral nodules. 2. Recommend FNA biopsy of moderately suspicious 1.8 cm mid left AND 2.3 cm inferior right nodules. 3. Recommend annual/biennial ultrasound follow-up of additional nodules as above, until stability x5 years confirmed.  08/05/2019: FNA of the 2 nodules: Clinical History: Right inferior 2.3cm; Other 2 dimensions: 1.9 x 1.9cm,  Solid / almost completely solid, Hypoechoic, TI-RADS total points 4  Specimen Submitted:  A. THYROID , RLP, FINE NEEDLE ASPIRATION:  DIAGNOSIS:  - Consistent with benign follicular nodule (Bethesda category II)  SPECIMEN ADEQUACY:  Satisfactory for evaluation   Clinical History: Left mid 1.8cm; Other 2 dimensions: 1.6 x 1.3cm, Solid  / almost completely solid, Hypoechoic, TI-RADS total points 4  Specimen Submitted:  A. THYROID , LMP, FINE NEEDLE  ASPIRATION:  DIAGNOSIS:  - Consistent with benign follicular nodule (Bethesda category II)  SPECIMEN ADEQUACY:  Satisfactory for evaluation  Thyroid  U/S (09/18/2021): Parenchymal Echotexture: Mildly heterogenous Isthmus: 0.5 cm, previously 0.4 cm Right lobe: 6.1 x 2.2 x 2.8 cm, previously 5.0 x 2.4 x 2.8 cm Left lobe: 5.3 x 2.3 x 2.4  cm, previously 4.5 x 2.0 x 2.1 cm _________________________________________________________   Estimated total number of nodules >/= 1 cm: 5 _________________________________________________________   Nodule # 1: Location: Right; Superior Maximum size: 2.0 cm; Other 2 dimensions: 1.7 x 1.7 cm, previously 1.6 x 1.5 x 1.4 cm Composition: mixed cystic and solid (1) Echogenicity: isoechoic (1) This nodule does NOT meet TI-RADS criteria for biopsy or dedicated follow-up. _________________________________________________________   Nodule # 2: Location: Right; Mid Maximum size: 2.9 cm; Other 2 dimensions: 2.2 x 1.7 cm, previously 2.3 x 1.9 x 1.9 cm This nodule is mixed cystic and solid, and was previously biopsied.  _________________________________________________________   Nodule # 3: Location: Right; Inferior Maximum size: 1.5 cm; Other 2 dimensions: 1.4 x 0.9 cm Composition: solid/almost completely solid (2) Echogenicity: isoechoic (1) *Given size (>/= 1.5 - 2.4 cm) and appearance, a follow-up ultrasound in 1 year should be considered based on TI-RADS criteria. _________________________________________________________   Nodule # 4: Location: Left; Mid Maximum size: 2.5 cm; Other 2 dimensions: 2.0 x 1.5 cm, previously 1.8 x 1.6 x 1.2 cm This nodule  is mixed cystic and solid, and was previously biopsied. _________________________________________________________   Nodule # 5: Location: Left; Inferior Maximum size: 1.5 cm; Other 2 dimensions: 1.2 x 1.0 cm, previously 1.4 x 1.2 x 1.2 cm Composition: solid/almost completely solid (2) Echogenicity: isoechoic (1) *Given size (>/= 1.5 - 2.4 cm) and appearance, a follow-up ultrasound in 1 year should be considered based on TI-RADS criteria.  _________________________________________________________   No cervical lymphadenopathy, within the imaged neck   IMPRESSION: 1. Multinodular thyroid  gland, with similar appearance of  previously-biopsied nodules as above. 2. 1.5 cm bilateral TR 3 nodules. Attention on follow-up.  *Given size (>/= 1.5 - 2.4 cm) and appearance, a follow-up ultrasound in 1 year should be considered based on TI-RADS criteria.  Thyroid  U/S (09/06/2022): Parenchymal Echotexture: Mildly heterogenous  Isthmus: 0.4 cm  Right lobe: 6.0 x 2.3 x 3.1 cm  Left lobe: 5.1 x 2.3 x 2.4 cm  _________________________________________________________   Estimated total number of nodules >/= 1 cm: 5 _________________________________________________________   Nodule # 1:  Location: RIGHT; Superior  Maximum size: 2.1 cm; Other 2 dimensions: 1.9 x 2.5 cm, previously 2.0 x 1.7 x 1.7 cm  Composition: mixed cystic and solid (1) Echogenicity: isoechoic (1)  This nodule does NOT meet TI-RADS criteria for biopsy or dedicated follow-up.  _________________________________________________________   Nodule # 2:  Location: RIGHT; Mid/inferior  Maximum size: 3.1 cm; Other 2 dimensions: 2.5 x 1.9 cm  No aggressive features on today's evaluation. This nodule appears morphologically stable for and was previously biopsied in 08/05/2019. Assuming a benign pathologic diagnosis, repeat sampling and/or dedicated follow-up is not recommended.  _________________________________________________________   Nodule # 3:  Location: RIGHT; Inferior  Maximum size: 1.5 cm; Other 2 dimensions: 1.4 x 1.3 cm, previously 1.5 x 1.4 x 0.9 cm  Composition: solid/almost completely solid (2)  Echogenicity: isoechoic (1) *Given size (>/= 1.5 - 2.4 cm) and appearance, a follow-up ultrasound in 1 year should be considered based on TI-RADS criteria.  _________________________________________________________   Nodule # 4:  Location: LEFT; Mid  Maximum size: 2.6 cm; Other 2 dimensions: 2.2 x 2.2 cm, previously 2.5 x 2.0 x 1.5 cm  No aggressive features on today's evaluation. This nodule appears morphologically stable and was previously  biopsied in 08/05/2019. Assuming a benign pathologic diagnosis, repeat sampling and/or dedicated follow-up is not recommended.  _________________________________________________________   Nodule # 5:  Location: LEFT; Inferior  Maximum size: 1.7 cm; Other 2 dimensions: 1.6 x 1.1 cm, previously 1.5 x 1.2 x 1.0 cm  Composition: solid/almost completely solid (2)  Echogenicity: isoechoic (1) *Given size (>/= 1.5 - 2.4 cm) and appearance, a follow-up ultrasound in 1 year should be considered based on TI-RADS criteria.  _________________________________________________________   No cervical adenopathy or abnormal fluid collection within the imaged neck.   IMPRESSION: 1. Multinodular thyroid  gland. 2. 1.5 cm bilateral inferior TR-3 thyroid  nodules. A follow-up ultrasound in 1 year should be considered based on TI-RADS criteria. 3. Additional smaller and previously-biopsied nodules, as described above, without follow-up nor biopsy indicated per current criteria.  Thyroid  U/S (01/07/2024): Parenchymal Echotexture: Moderately heterogeneous  Isthmus: 0.5 cm ,previously 0.46 cm  Right lobe: 6.2 x 3.1 x 2.9 cm ,previously 6.0 x 2 point cm  Left lobe: 5.1 x 2.1 x 2.1 cm ,previously 5.1 x 2.3 x 2.4 cm  ________________________________________________________   Estimated total number of nodules >/= 1 cm: 5 _________________________________________________________   Slight interval enlargement however persistent benign appearance of solid cystic nodule in the right superior thyroid  (  labeled 1, 2.6 cm, previously 2.1 cm).   Similar appearance of previously biopsied solid cystic nodule in the right mid thyroid  (labeled 2, 2.4 cm, previously 3.1 cm).   Nodule # 3:  Prior biopsy: No  Location: Right; Inferior  Maximum size: 1.3 cm; Other 2 dimensions: 1.0 x 1.0 cm, previously, 1.5 x 1.3 x 1.4 cm  Composition: spongiform (0)  Echogenicity: isoechoic (1) Change in features: Yes This nodule  does NOT meet TI-RADS criteria for biopsy or dedicated follow-up.  _________________________________________________________   There is subcentimeter pseudo nodularity about the left superior thyroid  which does not require follow-up.   Similar appearance of previously biopsied solid cystic nodule in the left mid thyroid  (labeled 5, 2.5 cm, previously labeled 4, 2.6 cm).   Ill-defined spongiform like pseudo nodularity about the posterior left mid thyroid  which is not require additional follow-up.   Nodule # 6, previously labeled 5:  Prior biopsy: No  Location: Left; Inferior   Maximum size: 1.3 cm; Other 2 dimensions: 1.1 x 1.0 cm, previously, 1.7 x 1.6 x 1.1 cm  Composition: solid/almost completely solid (2)  Echogenicity: isoechoic (1) Given size (<1.4 cm) and appearance, this nodule does NOT meet TI-RADS criteria for biopsy or dedicated follow-up.  _________________________________________________________   No cervical lymphadenopathy.   IMPRESSION: 1. Similar appearing multinodular thyroid . 2. Similar appearance of previously biopsied right mid (labeled 2, 2.4 cm, previously 3.1 cm) and left mid (labeled 5, 2.5 cm, previously labeled 4 2.6 cm). Recommend correlation with prior biopsy results. 3. Previously visualized right inferior thyroid  nodule (labeled 3, 1.3 cm, previously 1.5 cm) and left inferior (labeled 6, 1.3 cm, previously labeled 5, 1.7 cm) now demonstrate benign characteristics (TI-RADS category 1 and TI-RADS category 3, respectively) and do not warrant additional follow-up.   The right superior  thyroid  nodule is slightly larger, possibly due to cystic component.  The rest of the nodules appear to be stable or decreased in size.  Reviewed her TFTs: Lab Results  Component Value Date   TSH 1.05 12/01/2021   TSH 1.74 10/27/2020   TSH 1.35 05/25/2019   TSH 1.070 07/28/2015   TSH 1.13 10/15/2014   Pt denies: - feeling nodules in neck - hoarseness -  dysphagia - choking  She also has a history of PCOS and distant history of epilepsy at 48 years old. She had several ED visits in 2024-2025 for various problems, including a seizure on 11/06/2023.  Glucose was not low at that time.  The potassium was low, at 2.9. She had the flu A + PNA few days prev. (Temp 105.83F!!).   ROS: Signs see HPI  I reviewed pt's medications, allergies, PMH, social hx, family hx, and changes were documented in the history of present illness. Otherwise, unchanged from my initial visit note.  Past Medical History:  Diagnosis Date   Allergic rhinitis    Breast discharge 06/05/2017   2 weeks ago left   Breast mass 12/06/2016   left   COVID-19 virus infection 11/2020   Diabetes mellitus without complication (HCC)    Dyslipidemia    Epilepsy (HCC)    last seizure 11/06/2023   Galactorrhea    Hx gestational diabetes    Hypertension    Kidney stones    Morbid obesity (HCC)    Sleep apnea    Syncope and collapse    Tachycardia    Past Surgical History:  Procedure Laterality Date   ABDOMINOPLASTY/PANNICULECTOMY WITH LIPOSUCTION N/A 07/08/2024   Procedure: PANNICULECTOMY, ABDOMINAL, WITH LIPOSUCTION;  Surgeon: Lowery Estefana RAMAN, DO;  Location: [facility];  Service: Plastics;  Laterality: N/A;   BREAST BIOPSY Left 2018   benign   CESAREAN SECTION     X 2   COLONOSCOPY WITH PROPOFOL  N/A 09/26/2022   Procedure: COLONOSCOPY WITH PROPOFOL ;  Surgeon: Unk Corinn Skiff, MD;  Location: [facility];  Service: Gastroenterology;  Laterality: N/A;   IRRIGATION AND DEBRIDEMENT SHOULDER Left 10/12/2020   Procedure: IRRIGATION AND DEBRIDEMENT SHOULDER;  Surgeon: Cristy Bonner DASEN, MD;  Location: [facility];  Service: Orthopedics;  Laterality: Left;   RIGHT OOPHORECTOMY Right 2001   benign tumor   TUBAL LIGATION  2007   Social History   Socioeconomic History   Marital status: Married    Spouse name: Dorn   Number of children: 2   Years of education:  [employer] education level: Master's degree (e.g., MA, MS, MEng, MEd, MSW, MBA)  Occupational History   Occupation: accountant  Tobacco Use   Smoking status: Never   Smokeless tobacco: Never  Vaping Use   Vaping status: Never Used  Substance and Sexual Activity   Alcohol use: Not Currently    Comment: occ   Drug use: Not Currently   Sexual activity: Yes    Partners: Male    Birth control/protection: Surgical    Comment: tubial lig  Other Topics Concern   Not on file  Social History Narrative   ** Merged History Encounter **       Social Drivers of Health   Financial Resource Strain: Low Risk  (06/22/2024)   Overall Financial Resource Strain (CARDIA)    Difficulty of Paying Living Expenses: Not hard at all  Food Insecurity: No Food Insecurity (06/22/2024)   Hunger Vital Sign    Worried About Running Out of Food in the Last Year: Never true    Ran Out of Food in the Last Year: Never true  Transportation Needs: No Transportation Needs (06/22/2024)   PRAPARE - Administrator, Civil Service (Medical): No    Lack of Transportation (Non-Medical): No  Physical Activity: Insufficiently Active (06/22/2024)   Exercise Vital Sign    Days of Exercise per Week: 2 days    Minutes of Exercise per Session: 30 min  Stress: No Stress Concern Present (06/22/2024)   [employer] of Occupational Health - Occupational Stress Questionnaire    Feeling of Stress: Only a little  Social Connections: Moderately Integrated (06/22/2024)   Social Connection and Isolation Panel    Frequency of Communication with Friends and Family: More than three times a week    Frequency of Social Gatherings with Friends and Family: More than three times a week    Attends Religious Services: More than 4 times per year    Active Member of [employer] or Organizations: Yes    Attends Banker Meetings: More than 4 times per year    Marital Status: Separated  Intimate Partner Violence: Not At  Risk (12/30/2023)   Humiliation, Afraid, Rape, and Kick questionnaire    Fear of Current or Ex-Partner: No    Emotionally Abused: No    Physically Abused: No    Sexually Abused: No   Meds: Current Outpatient Medications on File Prior to Visit  Medication Sig Dispense Refill   Accu-Chek Softclix Lancets lancets Use as instructed 1x a day 100 each 3   Blood Glucose Monitoring Suppl (ACCU-CHEK GUIDE) w/Device KIT Use as advised 1 kit 0   Cholecalciferol (VITAMIN D ) 50  MCG (2000 UT) CAPS Take 1 capsule by mouth daily at 12 noon.     clonazePAM  (KLONOPIN ) 0.5 MG tablet Take 0.5 mg by mouth daily as needed.     Cyanocobalamin (B-12) 500 MCG SUBL Place 1 tablet under the tongue daily at 12 noon. 100 tablet 0   EPINEPHrine  0.3 mg/0.3 mL IJ SOAJ injection Inject 0.3 mg into the muscle as needed for anaphylaxis (for anaphylaxis).      folic acid  (FOLVITE ) 1 MG tablet Take 1 tablet (1 mg total) by mouth daily. 100 tablet 1   glucose blood test strip Use as instructed 1x a day 100 each 3   Insulin  Pen Needle 32G X 4 MM MISC Use 1x a day 100 each 3   ketoconazole  (NIZORAL ) 2 % cream Apply 1 Application topically daily. 120 g 0   metFORMIN  (GLUCOPHAGE -XR) 500 MG 24 hr tablet Take 4 tablets (2,000 mg total) by mouth daily with supper. 360 tablet 3   MOUNJARO  15 MG/0.5ML Pen INJECT THE CONTENTS OF ONE PEN  SUBCUTANEOUSLY WEEKLY AS  DIRECTED 6 mL 3   nitrofurantoin, macrocrystal-monohydrate, (MACROBID) 100 MG capsule Take 1 capsule (100 mg total) by mouth 2 (two) times daily. 10 capsule 0   phenazopyridine (PYRIDIUM) 200 MG tablet Take 1 tablet (200 mg total) by mouth 3 (three) times daily. 6 tablet 0   rosuvastatin  (CRESTOR ) 20 MG tablet Take 1 tablet (20 mg total) by mouth daily. 90 tablet 3   zonisamide  (ZONEGRAN ) 100 MG capsule Take 400 mg by mouth at bedtime.     No current facility-administered medications on file prior to visit.   Allergies  Allergen Reactions   Peanuts [Peanut Oil] Anaphylaxis    Aspirin Other (See Comments)    Does not take because of her epilepsy/seizure    Family History  Problem Relation Age of Onset   Diabetes Mother    Healthy Father    Cancer Maternal Grandmother    Breast cancer Paternal Grandmother 40   Cancer Paternal Grandmother    Cancer Paternal Aunt    Heart disease Neg Hx    PE: LMP  (LMP Unknown) Comment: none since 06/2024 Wt Readings from Last 10 Encounters:  08/25/24 245 lb (111.1 kg)  07/08/24 249 lb 1.9 oz (113 kg)  06/22/24 246 lb 1.6 oz (111.6 kg)  06/18/24 245 lb 3.2 oz (111.2 kg)  06/01/24 242 lb (109.8 kg)  05/13/24 245 lb 6.4 oz (111.3 kg)  03/02/24 254 lb 3.2 oz (115.3 kg)  02/25/24 250 lb 6.4 oz (113.6 kg)  01/29/24 255 lb 11.2 oz (116 kg)  01/07/24 253 lb 3.2 oz (114.9 kg)   Constitutional: overweight, in NAD Eyes: EOMI, no exophthalmos ENT: + B thyromegaly, no cervical lymphadenopathy Cardiovascular: Tachycardia, RR, No MRG Respiratory: CTA B Musculoskeletal: no deformities Skin: no rashes Neurological: no tremor with outstretched hands  ASSESSMENT: 1. DM2, now noninsulin-dependent, uncontrolled, with complications - DR - MAU  2.  Thyroid  nodules  3. HL  PLAN:  1. Patient with longstanding, previous uncontrolled type 2 diabetes on weekly GLP-1/GIP receptor agonist and metformin , with insulin  stopped at last visit after HbA1c returned excellent, at 5.2%.  At that time she was taking the insulin  approximately once a week so I advised her to just stop this and let me know if she had higher blood sugars afterwards.  We did not change the metformin  and Mounjaro  doses.  She was not checking blood sugars and I strongly advised her to start especially as  we are stopping insulin .  - I suggested to:  Patient Instructions  Please continue: - Metformin  ER 2000 mg with dinner - Mounjaro  15 mg weekly  Please return in 4-6 months with your sugar log.   - we checked her HbA1c: 7%  - advised to check sugars at different  times of the day - 1x a day, rotating check times - advised for yearly eye exams >> she is UTD - return to clinic in 3-4 months  2.  Thyroid  nodules - No neck compression symptoms - Latest TSH was normal: Lab Results  Component Value Date   TSH 1.05 12/01/2021  - 2 of the nodules were biopsied in 07/2019 with benign results -The thyroid  ultrasound reports from 09/24/2021 and 09/24/2022 showed stable nodules.  We repeated a thyroid  ultrasound in 01/2024 and the nodules appeared to be stable on the latest ultrasound except for the right superior thyroid  nodule, which appears to be slightly larger, but this was likely due to the cystic component - We can continue to follow her clinically for now  3. HL - Reviewed lipid panel from 12/2023 and this showed an elevated LDL and suppressed HDL, with normal triglycerides, Lab Results  Component Value Date   CHOL 229 (H) 12/30/2023   HDL 47 (L) 12/30/2023   LDLCALC 159 (H) 12/30/2023   TRIG 112 12/30/2023   CHOLHDL 4.9 12/30/2023  -She is on Crestor  20 mg daily without side effects  Lela Fendt, MD PhD [facility]

## 2024-09-16 ENCOUNTER — Ambulatory Visit: Admitting: Internal Medicine

## 2024-09-16 ENCOUNTER — Telehealth: Admitting: Physician Assistant

## 2024-09-16 DIAGNOSIS — R3989 Other symptoms and signs involving the genitourinary system: Secondary | ICD-10-CM

## 2024-09-16 MED ORDER — SULFAMETHOXAZOLE-TRIMETHOPRIM 800-160 MG PO TABS: 1.0000 | ORAL_TABLET | Freq: Two times a day (BID) | ORAL | 0 refills | Status: AC

## 2024-09-16 NOTE — Progress Notes (Signed)
 Virtual Visit Consent   Erin Good, you are scheduled for a virtual visit with a [facility] provider today. Just as with appointments in the office, your consent must be obtained to participate. Your consent will be active for this visit and any virtual visit you may have with one of our providers in the next 365 days. If you have a MyChart account, a copy of this consent can be sent to you electronically.  As this is a virtual visit, video technology does not allow for your provider to perform a traditional examination. This may limit your provider's ability to fully assess your condition. If your provider identifies any concerns that need to be evaluated in person or the need to arrange testing (such as labs, EKG, etc.), we will make arrangements to do so. Although advances in technology are sophisticated, we cannot ensure that it will always work on either your end or our end. If the connection with a video visit is poor, the visit may have to be switched to a telephone visit. With either a video or telephone visit, we are not always able to ensure that we have a secure connection.  By engaging in this virtual visit, you consent to the provision of healthcare and authorize for your insurance to be billed (if applicable) for the services provided during this visit. Depending on your insurance coverage, you may receive a charge related to this service.  I need to obtain your verbal consent now. Are you willing to proceed with your visit today? Erin Good has provided verbal consent on 09/16/2024 for a virtual visit (video or telephone). Erin Good, [place]  Date: 09/16/2024 12:11 PM   Virtual Visit via Video Note   I, Erin Good, connected with  Erin Good  ([id number], 19-Jan-1976) on 09/16/24 at 12:00 PM EST by a video-enabled telemedicine application and verified that I am speaking with the correct person using two  identifiers.  Location: Patient: Virtual Visit Location Patient: Home Provider: Virtual Visit Location Provider: Home Office   I discussed the limitations of evaluation and management by telemedicine and the availability of in person appointments. The patient expressed understanding and agreed to proceed.    History of Present Illness: Erin Good is a 48 y.o. who identifies as a female who was assigned female at birth, and is being seen today for concern of continued UTI. Was evaluated a month ago with PCP with [facility] concerning for UTI. Macrobid was started while culture was in process. Culture showed multiple species and recollection recommended but at time symptoms were improving. Notes only slightly remnant urgency here and there up until a few days ago when she began noting dysuria and frequency. Some chills yesterday only. Denies fever, vomiting, flank pain. Suprapubic pressure/tenderness.   HPI: HPI  Problems:  Patient Active Problem List   Diagnosis Date Noted   Back pain 06/01/2024   Panniculitis 06/01/2024   History of abnormal cervical Pap smear 01/29/2024   Menorrhagia with irregular cycle 01/29/2024   Diabetes mellitus without complication (HCC) 01/29/2024   Abnormal screening mammogram 01/29/2024   Obesity, Class III, BMI 40-49.9 (morbid obesity) (HCC) 11/06/2023   Encounter for well woman exam with routine gynecological exam 09/26/2022   Morbid obesity (HCC)    Impingement syndrome of left shoulder region 04/20/2020   Vitamin D  deficiency 01/31/2017   Bell's palsy 12/19/2016   Allergic rhinitis 04/20/2015   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   Gastro-esophageal reflux disease without  esophagitis 04/20/2015   NASH (nonalcoholic steatohepatitis) 04/20/2015   Allergy to nuts 04/20/2015   Calculus of kidney 04/20/2015   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   Dyslipidemia 07/02/2008   Leukocytosis 07/29/2007    Allergies:   Allergies  Allergen Reactions   Peanuts [Peanut Oil] Anaphylaxis   Aspirin Other (See Comments)    Does not take because of her epilepsy/seizure    Medications:  Current Outpatient Medications:    Accu-Chek Softclix Lancets lancets, Use as instructed 1x a day, Disp: 100 each, Rfl: 3   Blood Glucose Monitoring Suppl (ACCU-CHEK GUIDE) w/Device KIT, Use as advised, Disp: 1 kit, Rfl: 0   Cholecalciferol (VITAMIN D ) 50 MCG (2000 UT) CAPS, Take 1 capsule by mouth daily at 12 noon., Disp: , Rfl:    clonazePAM  (KLONOPIN ) 0.5 MG tablet, Take 0.5 mg by mouth daily as needed., Disp: , Rfl:    Cyanocobalamin (B-12) 500 MCG SUBL, Place 1 tablet under the tongue daily at 12 noon., Disp: 100 tablet, Rfl: 0   EPINEPHrine  0.3 mg/0.3 mL IJ SOAJ injection, Inject 0.3 mg into the muscle as needed for anaphylaxis (for anaphylaxis). , Disp: , Rfl:    folic acid  (FOLVITE ) 1 MG tablet, Take 1 tablet (1 mg total) by mouth daily., Disp: 100 tablet, Rfl: 1   glucose blood test strip, Use as instructed 1x a day, Disp: 100 each, Rfl: 3   Insulin  Pen Needle 32G X 4 MM MISC, Use 1x a day, Disp: 100 each, Rfl: 3   ketoconazole  (NIZORAL ) 2 % cream, Apply 1 Application topically daily., Disp: 120 g, Rfl: 0   metFORMIN  (GLUCOPHAGE -XR) 500 MG 24 hr tablet, Take 4 tablets (2,000 mg total) by mouth daily with supper., Disp: 360 tablet, Rfl: 3   MOUNJARO  15 MG/0.5ML Pen, INJECT THE CONTENTS OF ONE PEN  SUBCUTANEOUSLY WEEKLY AS  DIRECTED, Disp: 6 mL, Rfl: 3   rosuvastatin  (CRESTOR ) 20 MG tablet, Take 1 tablet (20 mg total) by mouth daily., Disp: 90 tablet, Rfl: 3   sulfamethoxazole -trimethoprim  (BACTRIM  DS) 800-160 MG tablet, Take 1 tablet by mouth 2 (two) times daily., Disp: 10 tablet, Rfl: 0   zonisamide  (ZONEGRAN ) 100 MG capsule, Take 400 mg by mouth at bedtime., Disp: , Rfl:   Observations/Objective: Patient is well-developed, well-nourished in no acute distress.  Resting comfortably  at home.  Head is normocephalic,  atraumatic.  No labored breathing.  Speech is clear and coherent with logical content.  Patient is alert and oriented at baseline.   Assessment and Plan: 1. Suspected UTI (Primary) - sulfamethoxazole -trimethoprim  (BACTRIM  DS) 800-160 MG tablet; Take 1 tablet by mouth 2 (two) times daily.  Dispense: 10 tablet; Refill: 0  Classic UTI symptoms with absence of alarm signs or symptoms. Prior history of UTI. Will treat empirically with Bactrim  for suspected uncomplicated cystitis. Supportive measures and OTC medications reviewed. Strict in-person evaluation precautions discussed.    Follow Up Instructions: I discussed the assessment and treatment plan with the patient. The patient was provided an opportunity to ask questions and all were answered. The patient agreed with the plan and demonstrated an understanding of the instructions.  A copy of instructions were sent to the patient via MyChart unless otherwise noted below.   The patient was advised to call back or seek an in-person evaluation if the symptoms worsen or if the condition fails to improve as anticipated.    Erin Velma Lunger, PA-C

## 2024-09-16 NOTE — Patient Instructions (Signed)
 Bari SAILOR Gant-Pegues, thank you for joining [name], PA-C for today's virtual visit.  While this provider is not your primary care provider (PCP), if your PCP is located in our provider database this encounter information will be shared with them immediately following your visit.   A [facility] MyChart account gives you access to today's visit and all your visits, tests, and labs performed at [facility]  click here if you don't have a [facility] MyChart account or go to [email or web address]  Consent: (Patient) Ajanee Buren Gant-Pegues provided verbal consent for this virtual visit at the beginning of the encounter.  Current Medications:  Current Outpatient Medications:    Accu-Chek Softclix Lancets lancets, Use as instructed 1x a day, Disp: 100 each, Rfl: 3   Blood Glucose Monitoring Suppl (ACCU-CHEK GUIDE) w/Device KIT, Use as advised, Disp: 1 kit, Rfl: 0   Cholecalciferol (VITAMIN D ) 50 MCG (2000 UT) CAPS, Take 1 capsule by mouth daily at 12 noon., Disp: , Rfl:    clonazePAM  (KLONOPIN ) 0.5 MG tablet, Take 0.5 mg by mouth daily as needed., Disp: , Rfl:    Cyanocobalamin (B-12) 500 MCG SUBL, Place 1 tablet under the tongue daily at 12 noon., Disp: 100 tablet, Rfl: 0   EPINEPHrine  0.3 mg/0.3 mL IJ SOAJ injection, Inject 0.3 mg into the muscle as needed for anaphylaxis (for anaphylaxis). , Disp: , Rfl:    folic acid  (FOLVITE ) 1 MG tablet, Take 1 tablet (1 mg total) by mouth daily., Disp: 100 tablet, Rfl: 1   glucose blood test strip, Use as instructed 1x a day, Disp: 100 each, Rfl: 3   Insulin  Pen Needle 32G X 4 MM MISC, Use 1x a day, Disp: 100 each, Rfl: 3   ketoconazole  (NIZORAL ) 2 % cream, Apply 1 Application topically daily., Disp: 120 g, Rfl: 0   metFORMIN  (GLUCOPHAGE -XR) 500 MG 24 hr tablet, Take 4 tablets (2,000 mg total) by mouth daily with supper., Disp: 360 tablet, Rfl: 3   MOUNJARO  15 MG/0.5ML Pen, INJECT THE CONTENTS OF ONE PEN  SUBCUTANEOUSLY WEEKLY AS   DIRECTED, Disp: 6 mL, Rfl: 3   nitrofurantoin, macrocrystal-monohydrate, (MACROBID) 100 MG capsule, Take 1 capsule (100 mg total) by mouth 2 (two) times daily., Disp: 10 capsule, Rfl: 0   phenazopyridine (PYRIDIUM) 200 MG tablet, Take 1 tablet (200 mg total) by mouth 3 (three) times daily., Disp: 6 tablet, Rfl: 0   rosuvastatin  (CRESTOR ) 20 MG tablet, Take 1 tablet (20 mg total) by mouth daily., Disp: 90 tablet, Rfl: 3   zonisamide  (ZONEGRAN ) 100 MG capsule, Take 400 mg by mouth at bedtime., Disp: , Rfl:    Medications ordered in this encounter:  No orders of the defined types were placed in this encounter.    *If you need refills on other medications prior to your next appointment, please contact your pharmacy*  Follow-Up: Call back or seek an in-person evaluation if the symptoms worsen or if the condition fails to improve as anticipated.  [facility] [phone]  Other Instructions Your symptoms are consistent with a bladder infection, also called acute cystitis. Please take your antibiotic (Bactrim ) as directed until all pills are gone.  Stay very well hydrated.  Consider a daily probiotic (Align, Culturelle, or Activia) to help prevent stomach upset caused by the antibiotic.  Taking a probiotic daily may also help prevent recurrent UTIs.  Also consider taking AZO (Phenazopyridine) tablets to help decrease pain with urination.  If you note any non-resolving, new, or  worsening symptoms despite treatment, please seek an in-person evaluation ASAP.   Urinary Tract Infection A urinary tract infection (UTI) can occur any place along the urinary tract. The tract includes the kidneys, ureters, bladder, and urethra. A type of germ called bacteria often causes a UTI. UTIs are often helped with antibiotic medicine.  HOME CARE  If given, take antibiotics as told by your doctor. Finish them even if you start to feel better. Drink enough fluids to keep your pee (urine) clear or pale  yellow. Avoid tea, drinks with caffeine, and bubbly (carbonated) drinks. Pee often. Avoid holding your pee in for a long time. Pee before and after having sex (intercourse). Wipe from front to back after you poop (bowel movement) if you are a woman. Use each tissue only once. GET HELP RIGHT AWAY IF:  You have back pain. You have lower belly (abdominal) pain. You have chills. You feel sick to your stomach (nauseous). You throw up (vomit). Your burning or discomfort with peeing does not go away. You have a fever. Your symptoms are not better in 3 days. MAKE SURE YOU:  Understand these instructions. Will watch your condition. Will get help right away if you are not doing well or get worse. Document Released: 04/09/2008 Document Revised: 07/16/2012 Document Reviewed: 05/22/2012 [facility] Patient Information 2015 [place], [place]. This information is not intended to replace advice given to you by your health care provider. Make sure you discuss any questions you have with your health care provider.    If you have been instructed to have an in-person evaluation today at a local Urgent Care facility, please use the link below. It will take you to a list of all of our available [facility], including address, phone number and hours of operation. Please do not delay care.  [facility]  If you or a family member do not have a primary care provider, use the link below to schedule a visit and establish care. When you choose a [facility] primary care physician or advanced practice provider, you gain a long-term partner in health. Find a Primary Care Provider  Learn more about [facility]'s in-office and virtual care options: [facility] - Get Care Now

## 2024-09-16 NOTE — Progress Notes (Deleted)
 Patient ID: Erin Good, female   DOB: 11-16-1975, 48 y.o.   MRN: [id number]  This note was precharted 09/16/2024.  HPI: Erin Good is a 48 y.o.-year-old female, initially referred by her PCP, Dr. Glenard, presenting for follow-up for DM2, dx in ~2015, now noninsulin-dependent since 2019, uncontrolled, with complications (diabetic retinopathy, microalbuminuria) and also for thyroid  nodules.  Last visit 4 months ago.  Interim history: No increased urination, blurry vision, nausea, chest pain.  She lost approximately 70 pounds after starting Mounjaro , before last visit.  Reviewed HbA1c levels: Lab Results  Component Value Date   HGBA1C 5.2 (B) 05/13/2024   HGBA1C 5.7 (A) 01/07/2024   HGBA1C 5.9 (A) 07/04/2023   HGBA1C 5.8 (A) 01/03/2023   HGBA1C 6.2 (A) 09/04/2022   HGBA1C 6.5 (A) 04/04/2022   HGBA1C 7.8 (A) 12/01/2021   HGBA1C 9.7 (A) 09/01/2021   HGBA1C 7.6 (H) 10/03/2020   HGBA1C 7.9 (A) 08/15/2020   Pt was on a regimen of: - Metformin  ER 750 mg 2x a day, with meals - Soliqua  (Lantus  + Lixisenatide ) 50 units daily in am  Currently on: - Metformin  ER 750 mg 2x a day with meals >> 1500 >> 2000 mg with dinner -  >> stopped 05/2024 - Mounjaro  7.5 >> 10 >> 12.5 >> 15 mg weekly  Tried Farxiga  06/2020 >> not covered. She was previously on Mounjaro .  She is not checking blood sugars - no meter. From before: - am:  110-130 >> 87-110 >> 90-110, 120 >> 100-110 - 2h after b'fast: 166, 207 >> n/c >> 150-158 >> n/c - before lunch:   n/c >> 120-140 >> 110-140, 150 >> n/c  - 2h after lunch: 125-153 >> n/c >> 150s >> 140s >> <140 - before dinner: 70s, 120-140 >> 110-140, 150 >> 60 (flu) >> n/c - 2h after dinner: n/c >> 130-137, 180 >> <140, 160 - bedtime:170-330 >> N/c >> 76 if skips the meal >> n/c - nighttime: n/c Lowest sugar was 90 >> 100 >> ?; she has hypoglycemia awareness at 100. Highest sugar was 330 >> .SABRASABRA  180 (Covid) >> 160 >> ?[name]  Glucometer: [facility]  Pt's meals are: - Breakfast: 2 eggs + 3 pieces of bacon >> 2 eggs - snack: fruit - Lunch: salad - Dinner:meat + veggie + starch (rice + pasta) - Snacks: 1   -No CKD, last BUN/creatinine:  Lab Results  Component Value Date   BUN 8 07/03/2024   BUN 13 12/30/2023   CREATININE 0.85 07/03/2024   CREATININE 0.90 04/22/2024  Not on ACE inhibitor/ARB.  ACR levels were slightly elevated: Lab Results  Component Value Date   MICRALBCREAT 12 05/13/2024   MICRALBCREAT 33 (H) 11/12/2019   MICRALBCREAT 30 (H) 01/29/2019   -+ HL; last set of lipids: Lab Results  Component Value Date   CHOL 229 (H) 12/30/2023   HDL 47 (L) 12/30/2023   LDLCALC 159 (H) 12/30/2023   TRIG 112 12/30/2023   CHOLHDL 4.9 12/30/2023  Previously on Crestor  10 mg daily and I advised her to increase the dose to 20 mg daily. She takes this now.  - last eye exam was in 2024: ? DR (need to get the report).  - no numbness and tingling in her feet.  Occasional numbness in the right leg and foot.  Latest foot exam was on 05/13/2024.  Pt has FH of DM in mother, MGF.  Thyroid  nodules:  Reviewed previous work-up: 06/01/2019: Thyroid  ultrasound: Parenchymal Echotexture: Mildly heterogenous Isthmus:  0.4 cm thickness Right lobe: 5 x 2.4 x 2.8 cm Left lobe: 4.5 x 2 x 2.1 cm    Estimated total number of nodules >/= 1 cm: 4   Nodule # 1: Location: Right; Mid Maximum size: 1.6 cm; Other 2 dimensions: 1.5 x 1.4 cm Composition: mixed cystic and solid (1) Echogenicity: hypoechoic (2) *Given size (>/= 1.5 - 2.4 cm) and appearance, a follow-up ultrasound in 1 year should be considered based on TI-RADS criteria. _________________________________________________________   Nodule # 2: Location: Right; Inferior Maximum size: 2.3 cm; Other 2 dimensions: 1.9 x 1.9 cm Composition: solid/almost completely solid (2) Echogenicity: hypoechoic (2)  **Given size (>/= 1.5 cm) and appearance, fine needle aspiration of this  moderately suspicious nodule should be considered based on TI-RADS criteria. _________________________________________________   Nodule # 3: Location: Left; Inferior Maximum size: 1.4 cm; Other 2 dimensions: 1.2 x 1.2 cm Composition: solid/almost completely solid (2) Echogenicity: isoechoic (1)  Given size (<1.4 cm) and appearance, this nodule does NOT meet TI-RADS criteria for biopsy or dedicated follow-up. _________________________________________________________   Nodule # 4: Location: Left; Mid Maximum size: 1.8 cm; Other 2 dimensions: 1.6 x 1.3 cm Composition: solid/almost completely solid (2) Echogenicity: hypoechoic (2) **Given size (>/= 1.5 cm) and appearance, fine needle aspiration of this moderately suspicious nodule should be considered based on TI-RADS criteria.   IMPRESSION: 1. Thyromegaly with bilateral nodules. 2. Recommend FNA biopsy of moderately suspicious 1.8 cm mid left AND 2.3 cm inferior right nodules. 3. Recommend annual/biennial ultrasound follow-up of additional nodules as above, until stability x5 years confirmed.  08/05/2019: FNA of the 2 nodules: Clinical History: Right inferior 2.3cm; Other 2 dimensions: 1.9 x 1.9cm,  Solid / almost completely solid, Hypoechoic, TI-RADS total points 4  Specimen Submitted:  A. THYROID , RLP, FINE NEEDLE ASPIRATION:  DIAGNOSIS:  - Consistent with benign follicular nodule (Bethesda category II)  SPECIMEN ADEQUACY:  Satisfactory for evaluation   Clinical History: Left mid 1.8cm; Other 2 dimensions: 1.6 x 1.3cm, Solid  / almost completely solid, Hypoechoic, TI-RADS total points 4  Specimen Submitted:  A. THYROID , LMP, FINE NEEDLE  ASPIRATION:  DIAGNOSIS:  - Consistent with benign follicular nodule (Bethesda category II)  SPECIMEN ADEQUACY:  Satisfactory for evaluation  Thyroid  U/S (09/18/2021): Parenchymal Echotexture: Mildly heterogenous Isthmus: 0.5 cm, previously 0.4 cm Right lobe: 6.1 x 2.2 x 2.8 cm,  previously 5.0 x 2.4 x 2.8 cm Left lobe: 5.3 x 2.3 x 2.4 cm, previously 4.5 x 2.0 x 2.1 cm _________________________________________________________   Estimated total number of nodules >/= 1 cm: 5 _________________________________________________________   Nodule # 1: Location: Right; Superior Maximum size: 2.0 cm; Other 2 dimensions: 1.7 x 1.7 cm, previously 1.6 x 1.5 x 1.4 cm Composition: mixed cystic and solid (1) Echogenicity: isoechoic (1) This nodule does NOT meet TI-RADS criteria for biopsy or dedicated follow-up. _________________________________________________________   Nodule # 2: Location: Right; Mid Maximum size: 2.9 cm; Other 2 dimensions: 2.2 x 1.7 cm, previously 2.3 x 1.9 x 1.9 cm This nodule is mixed cystic and solid, and was previously biopsied.  _________________________________________________________   Nodule # 3: Location: Right; Inferior Maximum size: 1.5 cm; Other 2 dimensions: 1.4 x 0.9 cm Composition: solid/almost completely solid (2) Echogenicity: isoechoic (1) *Given size (>/= 1.5 - 2.4 cm) and appearance, a follow-up ultrasound in 1 year should be considered based on TI-RADS criteria. _________________________________________________________   Nodule # 4: Location: Left; Mid Maximum size: 2.5 cm; Other 2 dimensions: 2.0 x 1.5 cm, previously 1.8 x 1.6  x 1.2 cm This nodule is mixed cystic and solid, and was previously biopsied. _________________________________________________________   Nodule # 5: Location: Left; Inferior Maximum size: 1.5 cm; Other 2 dimensions: 1.2 x 1.0 cm, previously 1.4 x 1.2 x 1.2 cm Composition: solid/almost completely solid (2) Echogenicity: isoechoic (1) *Given size (>/= 1.5 - 2.4 cm) and appearance, a follow-up ultrasound in 1 year should be considered based on TI-RADS criteria.  _________________________________________________________   No cervical lymphadenopathy, within the imaged neck   IMPRESSION: 1.  Multinodular thyroid  gland, with similar appearance of previously-biopsied nodules as above. 2. 1.5 cm bilateral TR 3 nodules. Attention on follow-up.  *Given size (>/= 1.5 - 2.4 cm) and appearance, a follow-up ultrasound in 1 year should be considered based on TI-RADS criteria.  Thyroid  U/S (09/06/2022): Parenchymal Echotexture: Mildly heterogenous  Isthmus: 0.4 cm  Right lobe: 6.0 x 2.3 x 3.1 cm  Left lobe: 5.1 x 2.3 x 2.4 cm  _________________________________________________________   Estimated total number of nodules >/= 1 cm: 5 _________________________________________________________   Nodule # 1:  Location: RIGHT; Superior  Maximum size: 2.1 cm; Other 2 dimensions: 1.9 x 2.5 cm, previously 2.0 x 1.7 x 1.7 cm  Composition: mixed cystic and solid (1) Echogenicity: isoechoic (1)  This nodule does NOT meet TI-RADS criteria for biopsy or dedicated follow-up.  _________________________________________________________   Nodule # 2:  Location: RIGHT; Mid/inferior  Maximum size: 3.1 cm; Other 2 dimensions: 2.5 x 1.9 cm  No aggressive features on today's evaluation. This nodule appears morphologically stable for and was previously biopsied in 08/05/2019. Assuming a benign pathologic diagnosis, repeat sampling and/or dedicated follow-up is not recommended.  _________________________________________________________   Nodule # 3:  Location: RIGHT; Inferior  Maximum size: 1.5 cm; Other 2 dimensions: 1.4 x 1.3 cm, previously 1.5 x 1.4 x 0.9 cm  Composition: solid/almost completely solid (2)  Echogenicity: isoechoic (1) *Given size (>/= 1.5 - 2.4 cm) and appearance, a follow-up ultrasound in 1 year should be considered based on TI-RADS criteria.  _________________________________________________________   Nodule # 4:  Location: LEFT; Mid  Maximum size: 2.6 cm; Other 2 dimensions: 2.2 x 2.2 cm, previously 2.5 x 2.0 x 1.5 cm  No aggressive features on today's evaluation. This nodule  appears morphologically stable and was previously biopsied in 08/05/2019. Assuming a benign pathologic diagnosis, repeat sampling and/or dedicated follow-up is not recommended.  _________________________________________________________   Nodule # 5:  Location: LEFT; Inferior  Maximum size: 1.7 cm; Other 2 dimensions: 1.6 x 1.1 cm, previously 1.5 x 1.2 x 1.0 cm  Composition: solid/almost completely solid (2)  Echogenicity: isoechoic (1) *Given size (>/= 1.5 - 2.4 cm) and appearance, a follow-up ultrasound in 1 year should be considered based on TI-RADS criteria.  _________________________________________________________   No cervical adenopathy or abnormal fluid collection within the imaged neck.   IMPRESSION: 1. Multinodular thyroid  gland. 2. 1.5 cm bilateral inferior TR-3 thyroid  nodules. A follow-up ultrasound in 1 year should be considered based on TI-RADS criteria. 3. Additional smaller and previously-biopsied nodules, as described above, without follow-up nor biopsy indicated per current criteria.  Thyroid  U/S (01/07/2024): Parenchymal Echotexture: Moderately heterogeneous  Isthmus: 0.5 cm ,previously 0.46 cm  Right lobe: 6.2 x 3.1 x 2.9 cm ,previously 6.0 x 2 point cm  Left lobe: 5.1 x 2.1 x 2.1 cm ,previously 5.1 x 2.3 x 2.4 cm  ________________________________________________________   Estimated total number of nodules >/= 1 cm: 5 _________________________________________________________   Slight interval enlargement however persistent benign appearance of solid cystic nodule  in the right superior thyroid  (labeled 1, 2.6 cm, previously 2.1 cm).   Similar appearance of previously biopsied solid cystic nodule in the right mid thyroid  (labeled 2, 2.4 cm, previously 3.1 cm).   Nodule # 3:  Prior biopsy: No  Location: Right; Inferior  Maximum size: 1.3 cm; Other 2 dimensions: 1.0 x 1.0 cm, previously, 1.5 x 1.3 x 1.4 cm  Composition: spongiform (0)  Echogenicity:  isoechoic (1) Change in features: Yes This nodule does NOT meet TI-RADS criteria for biopsy or dedicated follow-up.  _________________________________________________________   There is subcentimeter pseudo nodularity about the left superior thyroid  which does not require follow-up.   Similar appearance of previously biopsied solid cystic nodule in the left mid thyroid  (labeled 5, 2.5 cm, previously labeled 4, 2.6 cm).   Ill-defined spongiform like pseudo nodularity about the posterior left mid thyroid  which is not require additional follow-up.   Nodule # 6, previously labeled 5:  Prior biopsy: No  Location: Left; Inferior   Maximum size: 1.3 cm; Other 2 dimensions: 1.1 x 1.0 cm, previously, 1.7 x 1.6 x 1.1 cm  Composition: solid/almost completely solid (2)  Echogenicity: isoechoic (1) Given size (<1.4 cm) and appearance, this nodule does NOT meet TI-RADS criteria for biopsy or dedicated follow-up.  _________________________________________________________   No cervical lymphadenopathy.   IMPRESSION: 1. Similar appearing multinodular thyroid . 2. Similar appearance of previously biopsied right mid (labeled 2, 2.4 cm, previously 3.1 cm) and left mid (labeled 5, 2.5 cm, previously labeled 4 2.6 cm). Recommend correlation with prior biopsy results. 3. Previously visualized right inferior thyroid  nodule (labeled 3, 1.3 cm, previously 1.5 cm) and left inferior (labeled 6, 1.3 cm, previously labeled 5, 1.7 cm) now demonstrate benign characteristics (TI-RADS category 1 and TI-RADS category 3, respectively) and do not warrant additional follow-up.   The right superior  thyroid  nodule is slightly larger, possibly due to cystic component.  The rest of the nodules appear to be stable or decreased in size.  Reviewed her TFTs: Lab Results  Component Value Date   TSH 1.05 12/01/2021   TSH 1.74 10/27/2020   TSH 1.35 05/25/2019   TSH 1.070 07/28/2015   TSH 1.13 10/15/2014   Pt  denies: - feeling nodules in neck - hoarseness - dysphagia - choking  She also has a history of PCOS and distant history of epilepsy at 48 years old. She had several ED visits in 2024-2025 for various problems, including a seizure on 11/06/2023.  Glucose was not low at that time.  The potassium was low, at 2.9. She had the flu A + PNA few days prev. (Temp 105.11F!!).   ROS: Signs see HPI  I reviewed pt's medications, allergies, PMH, social hx, family hx, and changes were documented in the history of present illness. Otherwise, unchanged from my initial visit note.  Past Medical History:  Diagnosis Date   Allergic rhinitis    Breast discharge 06/05/2017   2 weeks ago left   Breast mass 12/06/2016   left   COVID-19 virus infection 11/2020   Diabetes mellitus without complication (HCC)    Dyslipidemia    Epilepsy (HCC)    last seizure 11/06/2023   Galactorrhea    Hx gestational diabetes    Hypertension    Kidney stones    Morbid obesity (HCC)    Sleep apnea    Syncope and collapse    Tachycardia    Past Surgical History:  Procedure Laterality Date   ABDOMINOPLASTY/PANNICULECTOMY WITH LIPOSUCTION N/A 07/08/2024  Procedure: PANNICULECTOMY, ABDOMINAL, WITH LIPOSUCTION;  Surgeon: Lowery Estefana RAMAN, DO;  Location: [facility];  Service: Plastics;  Laterality: N/A;   BREAST BIOPSY Left 2018   benign   CESAREAN SECTION     X 2   COLONOSCOPY WITH PROPOFOL  N/A 09/26/2022   Procedure: COLONOSCOPY WITH PROPOFOL ;  Surgeon: Unk Corinn Skiff, MD;  Location: [facility];  Service: Gastroenterology;  Laterality: N/A;   IRRIGATION AND DEBRIDEMENT SHOULDER Left 10/12/2020   Procedure: IRRIGATION AND DEBRIDEMENT SHOULDER;  Surgeon: Cristy Bonner DASEN, MD;  Location: [facility];  Service: Orthopedics;  Laterality: Left;   RIGHT OOPHORECTOMY Right 2001   benign tumor   TUBAL LIGATION  2007   Social History   Socioeconomic History   Marital status: Married    Spouse name:  Dorn   Number of children: 2   Years of education: [employer] education level: Master's degree (e.g., MA, MS, MEng, MEd, MSW, MBA)  Occupational History   Occupation: accountant  Tobacco Use   Smoking status: Never   Smokeless tobacco: Never  Vaping Use   Vaping status: Never Used  Substance and Sexual Activity   Alcohol use: Not Currently    Comment: occ   Drug use: Not Currently   Sexual activity: Yes    Partners: Male    Birth control/protection: Surgical    Comment: tubial lig  Other Topics Concern   Not on file  Social History Narrative   ** Merged History Encounter **       Social Drivers of Health   Financial Resource Strain: Low Risk  (06/22/2024)   Overall Financial Resource Strain (CARDIA)    Difficulty of Paying Living Expenses: Not hard at all  Food Insecurity: No Food Insecurity (06/22/2024)   Hunger Vital Sign    Worried About Running Out of Food in the Last Year: Never true    Ran Out of Food in the Last Year: Never true  Transportation Needs: No Transportation Needs (06/22/2024)   PRAPARE - Administrator, Civil Service (Medical): No    Lack of Transportation (Non-Medical): No  Physical Activity: Insufficiently Active (06/22/2024)   Exercise Vital Sign    Days of Exercise per Week: 2 days    Minutes of Exercise per Session: 30 min  Stress: No Stress Concern Present (06/22/2024)   [employer] of Occupational Health - Occupational Stress Questionnaire    Feeling of Stress: Only a little  Social Connections: Moderately Integrated (06/22/2024)   Social Connection and Isolation Panel    Frequency of Communication with Friends and Family: More than three times a week    Frequency of Social Gatherings with Friends and Family: More than three times a week    Attends Religious Services: More than 4 times per year    Active Member of [employer] or Organizations: Yes    Attends Banker Meetings: More than 4 times per year     Marital Status: Separated  Intimate Partner Violence: Not At Risk (12/30/2023)   Humiliation, Afraid, Rape, and Kick questionnaire    Fear of Current or Ex-Partner: No    Emotionally Abused: No    Physically Abused: No    Sexually Abused: No   Meds: Current Outpatient Medications on File Prior to Visit  Medication Sig Dispense Refill   Accu-Chek Softclix Lancets lancets Use as instructed 1x a day 100 each 3   Blood Glucose Monitoring Suppl (ACCU-CHEK GUIDE) w/Device KIT Use as advised 1 kit 0  Cholecalciferol (VITAMIN D ) 50 MCG (2000 UT) CAPS Take 1 capsule by mouth daily at 12 noon.     clonazePAM  (KLONOPIN ) 0.5 MG tablet Take 0.5 mg by mouth daily as needed.     Cyanocobalamin (B-12) 500 MCG SUBL Place 1 tablet under the tongue daily at 12 noon. 100 tablet 0   EPINEPHrine  0.3 mg/0.3 mL IJ SOAJ injection Inject 0.3 mg into the muscle as needed for anaphylaxis (for anaphylaxis).      folic acid  (FOLVITE ) 1 MG tablet Take 1 tablet (1 mg total) by mouth daily. 100 tablet 1   glucose blood test strip Use as instructed 1x a day 100 each 3   Insulin  Pen Needle 32G X 4 MM MISC Use 1x a day 100 each 3   ketoconazole  (NIZORAL ) 2 % cream Apply 1 Application topically daily. 120 g 0   metFORMIN  (GLUCOPHAGE -XR) 500 MG 24 hr tablet Take 4 tablets (2,000 mg total) by mouth daily with supper. 360 tablet 3   MOUNJARO  15 MG/0.5ML Pen INJECT THE CONTENTS OF ONE PEN  SUBCUTANEOUSLY WEEKLY AS  DIRECTED 6 mL 3   nitrofurantoin, macrocrystal-monohydrate, (MACROBID) 100 MG capsule Take 1 capsule (100 mg total) by mouth 2 (two) times daily. 10 capsule 0   phenazopyridine (PYRIDIUM) 200 MG tablet Take 1 tablet (200 mg total) by mouth 3 (three) times daily. 6 tablet 0   rosuvastatin  (CRESTOR ) 20 MG tablet Take 1 tablet (20 mg total) by mouth daily. 90 tablet 3   zonisamide  (ZONEGRAN ) 100 MG capsule Take 400 mg by mouth at bedtime.     No current facility-administered medications on file prior to visit.    Allergies  Allergen Reactions   Peanuts [Peanut Oil] Anaphylaxis   Aspirin Other (See Comments)    Does not take because of her epilepsy/seizure    Family History  Problem Relation Age of Onset   Diabetes Mother    Healthy Father    Cancer Maternal Grandmother    Breast cancer Paternal Grandmother 58   Cancer Paternal Grandmother    Cancer Paternal Aunt    Heart disease Neg Hx    PE: LMP  (LMP Unknown) Comment: none since 06/2024 Wt Readings from Last 10 Encounters:  08/25/24 245 lb (111.1 kg)  07/08/24 249 lb 1.9 oz (113 kg)  06/22/24 246 lb 1.6 oz (111.6 kg)  06/18/24 245 lb 3.2 oz (111.2 kg)  06/01/24 242 lb (109.8 kg)  05/13/24 245 lb 6.4 oz (111.3 kg)  03/02/24 254 lb 3.2 oz (115.3 kg)  02/25/24 250 lb 6.4 oz (113.6 kg)  01/29/24 255 lb 11.2 oz (116 kg)  01/07/24 253 lb 3.2 oz (114.9 kg)   Constitutional: overweight, in NAD Eyes: EOMI, no exophthalmos ENT: + B thyromegaly, no cervical lymphadenopathy Cardiovascular: Tachycardia, RR, No MRG Respiratory: CTA B Musculoskeletal: no deformities Skin: no rashes Neurological: no tremor with outstretched hands  ASSESSMENT: 1. DM2, now noninsulin-dependent, uncontrolled, with complications - DR - MAU  2.  Thyroid  nodules  3. HL  PLAN:  1. Patient with longstanding, previous uncontrolled type 2 diabetes on weekly GLP-1/GIP receptor agonist and metformin , with insulin  stopped at last visit after HbA1c returned excellent, at 5.2%.  At that time she was taking the insulin  approximately once a week so I advised her to just stop this and let me know if she had higher blood sugars afterwards.  We did not change the metformin  and Mounjaro  doses.  She was not checking blood sugars and I strongly advised her  to start especially as we are stopping insulin .  - I suggested to:  Patient Instructions  Please continue: - Metformin  ER 2000 mg with dinner - Mounjaro  15 mg weekly  Please return in 4-6 months with your sugar log.    - we checked her HbA1c: 7%  - advised to check sugars at different times of the day - 1x a day, rotating check times - advised for yearly eye exams >> she is UTD - return to clinic in 3-4 months  2.  Thyroid  nodules - No neck compression symptoms - Latest TSH was normal: Lab Results  Component Value Date   TSH 1.05 12/01/2021  - 2 of the nodules were biopsied in 07/2019 with benign results -The thyroid  ultrasound reports from 09/24/2021 and 09/24/2022 showed stable nodules.  We repeated a thyroid  ultrasound in 01/2024 and the nodules appeared to be stable on the latest ultrasound except for the right superior thyroid  nodule, which appears to be slightly larger, but this was likely due to the cystic component - Continue to follow her clinically for now  3. HL - Reviewed lipid panel from 12/2023 and this showed an elevated LDL and suppressed HDL, with normal triglycerides, Lab Results  Component Value Date   CHOL 229 (H) 12/30/2023   HDL 47 (L) 12/30/2023   LDLCALC 159 (H) 12/30/2023   TRIG 112 12/30/2023   CHOLHDL 4.9 12/30/2023  - She continues on Crestor  20 mg daily without side effects  Lela Fendt, MD PhD [facility]

## 2024-09-17 ENCOUNTER — Ambulatory Visit: Admitting: Internal Medicine

## 2024-09-29 ENCOUNTER — Ambulatory Visit: Admitting: Internal Medicine

## 2024-09-29 ENCOUNTER — Encounter: Payer: Self-pay | Admitting: Internal Medicine

## 2024-09-29 VITALS — BP 120/70 | HR 91 | Ht 67.0 in | Wt 243.0 lb

## 2024-09-29 DIAGNOSIS — E1129 Type 2 diabetes mellitus with other diabetic kidney complication: Secondary | ICD-10-CM

## 2024-09-29 DIAGNOSIS — E042 Nontoxic multinodular goiter: Secondary | ICD-10-CM

## 2024-09-29 DIAGNOSIS — E785 Hyperlipidemia, unspecified: Secondary | ICD-10-CM

## 2024-09-29 DIAGNOSIS — Z794 Long term (current) use of insulin: Secondary | ICD-10-CM

## 2024-09-29 DIAGNOSIS — R809 Proteinuria, unspecified: Secondary | ICD-10-CM

## 2024-09-29 LAB — POCT GLYCOSYLATED HEMOGLOBIN (HGB A1C): Hemoglobin A1C: 5.2 % (ref 4.0–5.6)

## 2024-09-29 NOTE — Progress Notes (Signed)
 Patient ID: JAIMEE Good, female   DOB: Nov 01, 1976, 48 y.o.   MRN: [id number]  This note was precharted 09/16/2024 and 09/17/2024.  HPI: Erin Good is a 48 y.o.-year-old female, initially referred by her PCP, Dr. Glenard, presenting for follow-up for DM2, dx in ~2015, now noninsulin-dependent since 2019, uncontrolled, with complications (diabetic retinopathy, microalbuminuria) and also for thyroid  nodules.  Last visit 4 months ago.  Interim history: No increased urination, blurry vision, nausea, chest pain.  She lost approximately >100 pounds after starting Mounjaro . Highest 370 lbs. Goal weight: approximately 200 pounds. Since last visit, she had a UTI, now resolved.  She also had a panniculectomy 07/08/2024.  Reviewed HbA1c levels: Lab Results  Component Value Date   HGBA1C 5.2 (B) 05/13/2024   HGBA1C 5.7 (A) 01/07/2024   HGBA1C 5.9 (A) 07/04/2023   HGBA1C 5.8 (A) 01/03/2023   HGBA1C 6.2 (A) 09/04/2022   HGBA1C 6.5 (A) 04/04/2022   HGBA1C 7.8 (A) 12/01/2021   HGBA1C 9.7 (A) 09/01/2021   HGBA1C 7.6 (H) 10/03/2020   HGBA1C 7.9 (A) 08/15/2020   Pt was on a regimen of: - Metformin  ER 750 mg 2x a day, with meals - Soliqua  (Lantus  + Lixisenatide ) 50 units daily in am  Currently on: - Metformin  ER 750 mg 2x a day with meals >> 1500 >> 2000 mg with dinner -  >> stopped 05/2024 - Mounjaro  7.5 >> 10 >> 12.5 >> 15 mg weekly  Tried Farxiga  06/2020 >> not covered. She was previously on Mounjaro .  She is not checking blood sugars - no meter: - am:  110-130 >> 87-110 >> 90-110, 120 >> 100-110 >> 90s-110 - 2h after b'fast: 166, 207 >> n/c >> 150-158 >> n/c - before lunch:   n/c >> 120-140 >> 110-140, 150 >> n/c  - 2h after lunch: 125-153 >> n/c >> 150s >> 140s >> <140 >> 130-140s - before dinner: 70s, 120-140 >> 110-140, 150 >> 60 (flu) >> n/c - 2h after dinner: n/c >> 130-137, 180 >> <140, 160 >> 130-140s - bedtime:170-330 >> N/c >> 76 if skips the meal >> n/c -  nighttime: n/c Lowest sugar was 90 >> 100 >> 90s; she has hypoglycemia awareness at 100. Highest sugar was 330 >> .SABRASABRA  180 (Covid) >> 160 >> 146.  Glucometer: [employer] advance  Pt's meals are: - Breakfast: 2 eggs + 3 pieces of bacon >> 2 eggs - snack: fruit - Lunch: salad - Dinner:meat + veggie + starch (rice + pasta) - Snacks: 1   -No CKD, last BUN/creatinine:  Lab Results  Component Value Date   BUN 8 07/03/2024   BUN 13 12/30/2023   CREATININE 0.85 07/03/2024   CREATININE 0.90 04/22/2024  Not on ACE inhibitor/ARB.  ACR levels were slightly elevated: Lab Results  Component Value Date   MICRALBCREAT 12 05/13/2024   MICRALBCREAT 33 (H) 11/12/2019   MICRALBCREAT 30 (H) 01/29/2019   -+ HL; last set of lipids: Lab Results  Component Value Date   CHOL 229 (H) 12/30/2023   HDL 47 (L) 12/30/2023   LDLCALC 159 (H) 12/30/2023   TRIG 112 12/30/2023   CHOLHDL 4.9 12/30/2023  Previously on Crestor  10 mg daily and I advised her to increase the dose to 20 mg daily. She takes this now.  - last eye exam was 08/2024: + DR reportedly - referred to retina specialist 10/2024: [facility].  - no numbness and tingling in her feet.  Occasional numbness in the right leg and foot.  Latest foot exam was on 05/13/2024.  Pt has FH of DM in mother, MGF.  Thyroid  nodules:  Reviewed previous work-up: 06/01/2019: Thyroid  ultrasound: Parenchymal Echotexture: Mildly heterogenous Isthmus: 0.4 cm thickness Right lobe: 5 x 2.4 x 2.8 cm Left lobe: 4.5 x 2 x 2.1 cm    Estimated total number of nodules >/= 1 cm: 4   Nodule # 1: Location: Right; Mid Maximum size: 1.6 cm; Other 2 dimensions: 1.5 x 1.4 cm Composition: mixed cystic and solid (1) Echogenicity: hypoechoic (2) *Given size (>/= 1.5 - 2.4 cm) and appearance, a follow-up ultrasound in 1 year should be considered based on TI-RADS criteria. _________________________________________________________   Nodule # 2: Location: Right; Inferior Maximum  size: 2.3 cm; Other 2 dimensions: 1.9 x 1.9 cm Composition: solid/almost completely solid (2) Echogenicity: hypoechoic (2)  **Given size (>/= 1.5 cm) and appearance, fine needle aspiration of this moderately suspicious nodule should be considered based on TI-RADS criteria. _________________________________________________   Nodule # 3: Location: Left; Inferior Maximum size: 1.4 cm; Other 2 dimensions: 1.2 x 1.2 cm Composition: solid/almost completely solid (2) Echogenicity: isoechoic (1)  Given size (<1.4 cm) and appearance, this nodule does NOT meet TI-RADS criteria for biopsy or dedicated follow-up. _________________________________________________________   Nodule # 4: Location: Left; Mid Maximum size: 1.8 cm; Other 2 dimensions: 1.6 x 1.3 cm Composition: solid/almost completely solid (2) Echogenicity: hypoechoic (2) **Given size (>/= 1.5 cm) and appearance, fine needle aspiration of this moderately suspicious nodule should be considered based on TI-RADS criteria.   IMPRESSION: 1. Thyromegaly with bilateral nodules. 2. Recommend FNA biopsy of moderately suspicious 1.8 cm mid left AND 2.3 cm inferior right nodules. 3. Recommend annual/biennial ultrasound follow-up of additional nodules as above, until stability x5 years confirmed.  08/05/2019: FNA of the 2 nodules: Clinical History: Right inferior 2.3cm; Other 2 dimensions: 1.9 x 1.9cm,  Solid / almost completely solid, Hypoechoic, TI-RADS total points 4  Specimen Submitted:  A. THYROID , RLP, FINE NEEDLE ASPIRATION:  DIAGNOSIS:  - Consistent with benign follicular nodule (Bethesda category II)  SPECIMEN ADEQUACY:  Satisfactory for evaluation   Clinical History: Left mid 1.8cm; Other 2 dimensions: 1.6 x 1.3cm, Solid  / almost completely solid, Hypoechoic, TI-RADS total points 4  Specimen Submitted:  A. THYROID , LMP, FINE NEEDLE  ASPIRATION:  DIAGNOSIS:  - Consistent with benign follicular nodule (Bethesda category II)   SPECIMEN ADEQUACY:  Satisfactory for evaluation  Thyroid  U/S (09/18/2021): Parenchymal Echotexture: Mildly heterogenous Isthmus: 0.5 cm, previously 0.4 cm Right lobe: 6.1 x 2.2 x 2.8 cm, previously 5.0 x 2.4 x 2.8 cm Left lobe: 5.3 x 2.3 x 2.4 cm, previously 4.5 x 2.0 x 2.1 cm _________________________________________________________   Estimated total number of nodules >/= 1 cm: 5 _________________________________________________________   Nodule # 1: Location: Right; Superior Maximum size: 2.0 cm; Other 2 dimensions: 1.7 x 1.7 cm, previously 1.6 x 1.5 x 1.4 cm Composition: mixed cystic and solid (1) Echogenicity: isoechoic (1) This nodule does NOT meet TI-RADS criteria for biopsy or dedicated follow-up. _________________________________________________________   Nodule # 2: Location: Right; Mid Maximum size: 2.9 cm; Other 2 dimensions: 2.2 x 1.7 cm, previously 2.3 x 1.9 x 1.9 cm This nodule is mixed cystic and solid, and was previously biopsied.  _________________________________________________________   Nodule # 3: Location: Right; Inferior Maximum size: 1.5 cm; Other 2 dimensions: 1.4 x 0.9 cm Composition: solid/almost completely solid (2) Echogenicity: isoechoic (1) *Given size (>/= 1.5 - 2.4 cm) and appearance, a follow-up ultrasound in 1 year should  be considered based on TI-RADS criteria. _________________________________________________________   Nodule # 4: Location: Left; Mid Maximum size: 2.5 cm; Other 2 dimensions: 2.0 x 1.5 cm, previously 1.8 x 1.6 x 1.2 cm This nodule is mixed cystic and solid, and was previously biopsied. _________________________________________________________   Nodule # 5: Location: Left; Inferior Maximum size: 1.5 cm; Other 2 dimensions: 1.2 x 1.0 cm, previously 1.4 x 1.2 x 1.2 cm Composition: solid/almost completely solid (2) Echogenicity: isoechoic (1) *Given size (>/= 1.5 - 2.4 cm) and appearance, a follow-up ultrasound in 1  year should be considered based on TI-RADS criteria.  _________________________________________________________   No cervical lymphadenopathy, within the imaged neck   IMPRESSION: 1. Multinodular thyroid  gland, with similar appearance of previously-biopsied nodules as above. 2. 1.5 cm bilateral TR 3 nodules. Attention on follow-up.  *Given size (>/= 1.5 - 2.4 cm) and appearance, a follow-up ultrasound in 1 year should be considered based on TI-RADS criteria.  Thyroid  U/S (09/06/2022): Parenchymal Echotexture: Mildly heterogenous  Isthmus: 0.4 cm  Right lobe: 6.0 x 2.3 x 3.1 cm  Left lobe: 5.1 x 2.3 x 2.4 cm  _________________________________________________________   Estimated total number of nodules >/= 1 cm: 5 _________________________________________________________   Nodule # 1:  Location: RIGHT; Superior  Maximum size: 2.1 cm; Other 2 dimensions: 1.9 x 2.5 cm, previously 2.0 x 1.7 x 1.7 cm  Composition: mixed cystic and solid (1) Echogenicity: isoechoic (1)  This nodule does NOT meet TI-RADS criteria for biopsy or dedicated follow-up.  _________________________________________________________   Nodule # 2:  Location: RIGHT; Mid/inferior  Maximum size: 3.1 cm; Other 2 dimensions: 2.5 x 1.9 cm  No aggressive features on today's evaluation. This nodule appears morphologically stable for and was previously biopsied in 08/05/2019. Assuming a benign pathologic diagnosis, repeat sampling and/or dedicated follow-up is not recommended.  _________________________________________________________   Nodule # 3:  Location: RIGHT; Inferior  Maximum size: 1.5 cm; Other 2 dimensions: 1.4 x 1.3 cm, previously 1.5 x 1.4 x 0.9 cm  Composition: solid/almost completely solid (2)  Echogenicity: isoechoic (1) *Given size (>/= 1.5 - 2.4 cm) and appearance, a follow-up ultrasound in 1 year should be considered based on TI-RADS criteria.   _________________________________________________________   Nodule # 4:  Location: LEFT; Mid  Maximum size: 2.6 cm; Other 2 dimensions: 2.2 x 2.2 cm, previously 2.5 x 2.0 x 1.5 cm  No aggressive features on today's evaluation. This nodule appears morphologically stable and was previously biopsied in 08/05/2019. Assuming a benign pathologic diagnosis, repeat sampling and/or dedicated follow-up is not recommended.  _________________________________________________________   Nodule # 5:  Location: LEFT; Inferior  Maximum size: 1.7 cm; Other 2 dimensions: 1.6 x 1.1 cm, previously 1.5 x 1.2 x 1.0 cm  Composition: solid/almost completely solid (2)  Echogenicity: isoechoic (1) *Given size (>/= 1.5 - 2.4 cm) and appearance, a follow-up ultrasound in 1 year should be considered based on TI-RADS criteria.  _________________________________________________________   No cervical adenopathy or abnormal fluid collection within the imaged neck.   IMPRESSION: 1. Multinodular thyroid  gland. 2. 1.5 cm bilateral inferior TR-3 thyroid  nodules. A follow-up ultrasound in 1 year should be considered based on TI-RADS criteria. 3. Additional smaller and previously-biopsied nodules, as described above, without follow-up nor biopsy indicated per current criteria.  Thyroid  U/S (01/07/2024): Parenchymal Echotexture: Moderately heterogeneous  Isthmus: 0.5 cm ,previously 0.46 cm  Right lobe: 6.2 x 3.1 x 2.9 cm ,previously 6.0 x 2 point cm  Left lobe: 5.1 x 2.1 x 2.1 cm ,previously 5.1 x 2.3  x 2.4 cm  ________________________________________________________   Estimated total number of nodules >/= 1 cm: 5 _________________________________________________________   Slight interval enlargement however persistent benign appearance of solid cystic nodule in the right superior thyroid  (labeled 1, 2.6 cm, previously 2.1 cm).   Similar appearance of previously biopsied solid cystic nodule in the right mid  thyroid  (labeled 2, 2.4 cm, previously 3.1 cm).   Nodule # 3:  Prior biopsy: No  Location: Right; Inferior  Maximum size: 1.3 cm; Other 2 dimensions: 1.0 x 1.0 cm, previously, 1.5 x 1.3 x 1.4 cm  Composition: spongiform (0)  Echogenicity: isoechoic (1) Change in features: Yes This nodule does NOT meet TI-RADS criteria for biopsy or dedicated follow-up.  _________________________________________________________   There is subcentimeter pseudo nodularity about the left superior thyroid  which does not require follow-up.   Similar appearance of previously biopsied solid cystic nodule in the left mid thyroid  (labeled 5, 2.5 cm, previously labeled 4, 2.6 cm).   Ill-defined spongiform like pseudo nodularity about the posterior left mid thyroid  which is not require additional follow-up.   Nodule # 6, previously labeled 5:  Prior biopsy: No  Location: Left; Inferior   Maximum size: 1.3 cm; Other 2 dimensions: 1.1 x 1.0 cm, previously, 1.7 x 1.6 x 1.1 cm  Composition: solid/almost completely solid (2)  Echogenicity: isoechoic (1) Given size (<1.4 cm) and appearance, this nodule does NOT meet TI-RADS criteria for biopsy or dedicated follow-up.  _________________________________________________________   No cervical lymphadenopathy.   IMPRESSION: 1. Similar appearing multinodular thyroid . 2. Similar appearance of previously biopsied right mid (labeled 2, 2.4 cm, previously 3.1 cm) and left mid (labeled 5, 2.5 cm, previously labeled 4 2.6 cm). Recommend correlation with prior biopsy results. 3. Previously visualized right inferior thyroid  nodule (labeled 3, 1.3 cm, previously 1.5 cm) and left inferior (labeled 6, 1.3 cm, previously labeled 5, 1.7 cm) now demonstrate benign characteristics (TI-RADS category 1 and TI-RADS category 3, respectively) and do not warrant additional follow-up.   The right superior  thyroid  nodule is slightly larger, possibly due to cystic component.  The  rest of the nodules appear to be stable or decreased in size.  Reviewed her TFTs: Lab Results  Component Value Date   TSH 1.05 12/01/2021   TSH 1.74 10/27/2020   TSH 1.35 05/25/2019   TSH 1.070 07/28/2015   TSH 1.13 10/15/2014   Pt denies: - feeling nodules in neck - hoarseness - dysphagia - choking  She also has a history of PCOS and distant history of epilepsy at 48 years old. She had several ED visits in 2024-2025 for various problems, including a seizure on 11/06/2023.  Glucose was not low at that time.  The potassium was low, at 2.9. She had the flu A + PNA few days prev. (Temp 105.37F!!).   ROS: Signs see HPI  I reviewed pt's medications, allergies, PMH, social hx, family hx, and changes were documented in the history of present illness. Otherwise, unchanged from my initial visit note.  Past Medical History:  Diagnosis Date   Allergic rhinitis    Breast discharge 06/05/2017   2 weeks ago left   Breast mass 12/06/2016   left   COVID-19 virus infection 11/2020   Diabetes mellitus without complication (HCC)    Dyslipidemia    Epilepsy (HCC)    last seizure 11/06/2023   Galactorrhea    Hx gestational diabetes    Hypertension    Kidney stones    Morbid obesity (HCC)    Sleep  apnea    Syncope and collapse    Tachycardia    Past Surgical History:  Procedure Laterality Date   ABDOMINOPLASTY/PANNICULECTOMY WITH LIPOSUCTION N/A 07/08/2024   Procedure: PANNICULECTOMY, ABDOMINAL, WITH LIPOSUCTION;  Surgeon: Lowery Estefana RAMAN, DO;  Location: [facility];  Service: Plastics;  Laterality: N/A;   BREAST BIOPSY Left 2018   benign   CESAREAN SECTION     X 2   COLONOSCOPY WITH PROPOFOL  N/A 09/26/2022   Procedure: COLONOSCOPY WITH PROPOFOL ;  Surgeon: Unk Corinn Skiff, MD;  Location: [facility] END[facility];  Service: Gastroenterology;  Laterality: N/A;   IRRIGATION AND DEBRIDEMENT SHOULDER Left 10/12/2020   Procedure: IRRIGATION AND DEBRIDEMENT SHOULDER;  Surgeon:  Cristy Bonner DASEN, MD;  Location: [facility];  Service: Orthopedics;  Laterality: Left;   RIGHT OOPHORECTOMY Right 2001   benign tumor   TUBAL LIGATION  2007   Social History   Socioeconomic History   Marital status: Married    Spouse name: Dorn   Number of children: 2   Years of education: [employer] education level: Master's degree (e.g., MA, MS, MEng, MEd, MSW, MBA)  Occupational History   Occupation: accountant  Tobacco Use   Smoking status: Never   Smokeless tobacco: Never  Vaping Use   Vaping status: Never Used  Substance and Sexual Activity   Alcohol use: Not Currently    Comment: occ   Drug use: Not Currently   Sexual activity: Yes    Partners: Male    Birth control/protection: Surgical    Comment: tubial lig  Other Topics Concern   Not on file  Social History Narrative   ** Merged History Encounter **       Social Drivers of Health   Financial Resource Strain: Low Risk  (06/22/2024)   Overall Financial Resource Strain (CARDIA)    Difficulty of Paying Living Expenses: Not hard at all  Food Insecurity: No Food Insecurity (06/22/2024)   Hunger Vital Sign    Worried About Running Out of Food in the Last Year: Never true    Ran Out of Food in the Last Year: Never true  Transportation Needs: No Transportation Needs (06/22/2024)   PRAPARE - Administrator, Civil Service (Medical): No    Lack of Transportation (Non-Medical): No  Physical Activity: Insufficiently Active (06/22/2024)   Exercise Vital Sign    Days of Exercise per Week: 2 days    Minutes of Exercise per Session: 30 min  Stress: No Stress Concern Present (06/22/2024)   [employer] of Occupational Health - Occupational Stress Questionnaire    Feeling of Stress: Only a little  Social Connections: Moderately Integrated (06/22/2024)   Social Connection and Isolation Panel    Frequency of Communication with Friends and Family: More than three times a week    Frequency of Social Gatherings  with Friends and Family: More than three times a week    Attends Religious Services: More than 4 times per year    Active Member of [employer] or Organizations: Yes    Attends Banker Meetings: More than 4 times per year    Marital Status: Separated  Intimate Partner Violence: Not At Risk (12/30/2023)   Humiliation, Afraid, Rape, and Kick questionnaire    Fear of Current or Ex-Partner: No    Emotionally Abused: No    Physically Abused: No    Sexually Abused: No   Meds: Current Outpatient Medications on File Prior to Visit  Medication Sig Dispense Refill  Accu-Chek Softclix Lancets lancets Use as instructed 1x a day 100 each 3   Blood Glucose Monitoring Suppl (ACCU-CHEK GUIDE) w/Device KIT Use as advised 1 kit 0   Cholecalciferol (VITAMIN D ) 50 MCG (2000 UT) CAPS Take 1 capsule by mouth daily at 12 noon.     clonazePAM  (KLONOPIN ) 0.5 MG tablet Take 0.5 mg by mouth daily as needed.     Cyanocobalamin (B-12) 500 MCG SUBL Place 1 tablet under the tongue daily at 12 noon. 100 tablet 0   EPINEPHrine  0.3 mg/0.3 mL IJ SOAJ injection Inject 0.3 mg into the muscle as needed for anaphylaxis (for anaphylaxis).      folic acid  (FOLVITE ) 1 MG tablet Take 1 tablet (1 mg total) by mouth daily. 100 tablet 1   glucose blood test strip Use as instructed 1x a day 100 each 3   Insulin  Pen Needle 32G X 4 MM MISC Use 1x a day 100 each 3   ketoconazole  (NIZORAL ) 2 % cream Apply 1 Application topically daily. 120 g 0   metFORMIN  (GLUCOPHAGE -XR) 500 MG 24 hr tablet Take 4 tablets (2,000 mg total) by mouth daily with supper. 360 tablet 3   MOUNJARO  15 MG/0.5ML Pen INJECT THE CONTENTS OF ONE PEN  SUBCUTANEOUSLY WEEKLY AS  DIRECTED 6 mL 3   rosuvastatin  (CRESTOR ) 20 MG tablet Take 1 tablet (20 mg total) by mouth daily. 90 tablet 3   sulfamethoxazole -trimethoprim  (BACTRIM  DS) 800-160 MG tablet Take 1 tablet by mouth 2 (two) times daily. 10 tablet 0   zonisamide  (ZONEGRAN ) 100 MG capsule Take 400 mg by mouth  at bedtime.     No current facility-administered medications on file prior to visit.   Allergies  Allergen Reactions   Peanuts [Peanut Oil] Anaphylaxis   Aspirin Other (See Comments)    Does not take because of her epilepsy/seizure    Family History  Problem Relation Age of Onset   Diabetes Mother    Healthy Father    Cancer Maternal Grandmother    Breast cancer Paternal Grandmother 14   Cancer Paternal Grandmother    Cancer Paternal Aunt    Heart disease Neg Hx    PE: BP 120/70   Pulse 91   Ht 5' 7 (1.702 m)   Wt 243 lb (110.2 kg)   SpO2 98%   BMI 38.06 kg/m  Wt Readings from Last 30 Encounters:  09/29/24 243 lb (110.2 kg)  08/25/24 245 lb (111.1 kg)  07/08/24 249 lb 1.9 oz (113 kg)  06/22/24 246 lb 1.6 oz (111.6 kg)  06/18/24 245 lb 3.2 oz (111.2 kg)  06/01/24 242 lb (109.8 kg)  05/13/24 245 lb 6.4 oz (111.3 kg)  03/02/24 254 lb 3.2 oz (115.3 kg)  02/25/24 250 lb 6.4 oz (113.6 kg)  01/29/24 255 lb 11.2 oz (116 kg)  01/07/24 253 lb 3.2 oz (114.9 kg)  01/03/24 250 lb 6.4 oz (113.6 kg)  12/30/23 251 lb 11.2 oz (114.2 kg)  11/06/23 264 lb (119.7 kg)  07/04/23 261 lb 3.2 oz (118.5 kg)  05/22/23 261 lb 8 oz (118.6 kg)  01/25/23 262 lb 9.6 oz (119.1 kg)  01/21/23 268 lb (121.6 kg)  01/03/23 272 lb 6.4 oz (123.6 kg)  12/16/22 270 lb (122.5 kg)  09/26/22 271 lb (122.9 kg)  09/04/22 279 lb 3.2 oz (126.6 kg)  06/26/22 280 lb (127 kg)  04/07/22 280 lb (127 kg)  04/04/22 283 lb (128.4 kg)  12/29/21 295 lb 11.2 oz (134.1 kg)  12/22/21 295  lb (133.8 kg)  12/01/21 (!) 300 lb 12.8 oz (136.4 kg)  08/25/21 (!) 321 lb 9.6 oz (145.9 kg)  08/24/21 (!) 319 lb 8 oz (144.9 kg)   Constitutional: overweight, in NAD Eyes: EOMI, no exophthalmos ENT: + B thyromegaly, no cervical lymphadenopathy Cardiovascular: Tachycardia, RR, No MRG Respiratory: CTA B Musculoskeletal: no deformities Skin: no rashes Neurological: no tremor with outstretched hands  ASSESSMENT: 1. DM2, now  noninsulin-dependent, uncontrolled, with complications - DR - MAU  2.  Thyroid  nodules  3. HL  PLAN:  1. Patient with longstanding, previous uncontrolled type 2 diabetes on weekly GLP-1/GIP receptor agonist and metformin , with insulin  stopped at last visit after HbA1c returned excellent, at 5.2%.  At that time she was taking the insulin  approximately once a week so I advised her to just stop this and let me know if she had higher blood sugars afterwards.  We did not change the metformin  and Mounjaro  doses.  She was not checking blood sugars and I strongly advised her to start especially as we are stopping insulin . -At today's visit, sugars remain excellent, all at goal.  She tolerates metformin  and Mounjaro  well.  HbA1c is stable, in the normal range, so at today's visit we discussed about decreasing the dose of metformin  after the holidays to only 1000 mg daily.  She agrees with this change. - I suggested to:  Patient Instructions  Please continue: - Metformin  ER 2000 mg with dinner (after the Holidays, you can decrease Metformin  to 1000 mg with dinner) - Mounjaro  15 mg weekly  Please return in 4-6 months with your sugar log.   - we checked her HbA1c: 5.2% (stable) - advised to check sugars at different times of the day - 1x a day, rotating check times - advised for yearly eye exams >> she is UTD - return to clinic in 4-6 months  2.  Thyroid  nodules - No neck compression symptoms - Latest TSH was normal: Lab Results  Component Value Date   TSH 1.05 12/01/2021  - 2 of the nodules were biopsied in 07/2019 with benign results -The thyroid  ultrasound reports from 09/24/2021 and 09/24/2022 showed stable nodules.  We repeated a thyroid  ultrasound in 01/2024 and the nodules appeared to be stable on the latest ultrasound except for the right superior thyroid  nodule, which appears to be slightly larger, but this was likely due to the cystic component - We will continue to follow her  clinically for now  3. HL - Latest lipid panel was reviewed from the beginning of the year: Elevated LDL, low HDL and normal triglycerides: Lab Results  Component Value Date   CHOL 229 (H) 12/30/2023   HDL 47 (L) 12/30/2023   LDLCALC 159 (H) 12/30/2023   TRIG 112 12/30/2023   CHOLHDL 4.9 12/30/2023  - She continues on Crestor  20 mg daily without side effects  Lela Fendt, MD PhD [facility] Endocrinology

## 2024-09-29 NOTE — Patient Instructions (Addendum)
 Please continue: - Metformin  ER 2000 mg with dinner (after the Holidays, you can decrease Metformin  to 1000 mg with dinner) - Mounjaro  15 mg weekly  Please return in 4-6 months with your sugar log.

## 2025-03-30 ENCOUNTER — Ambulatory Visit: Admitting: Internal Medicine
